# Patient Record
Sex: Female | Born: 1965 | Race: Black or African American | Hispanic: No | Smoking: Never smoker
Health system: Southern US, Community
[De-identification: ages and names within clinical notes are randomized; demographics above are authoritative.]

## PROBLEM LIST (undated history)

## (undated) DIAGNOSIS — C801 Malignant (primary) neoplasm, unspecified: Secondary | ICD-10-CM

## (undated) DIAGNOSIS — J449 Chronic obstructive pulmonary disease, unspecified: Secondary | ICD-10-CM

## (undated) DIAGNOSIS — J45909 Unspecified asthma, uncomplicated: Secondary | ICD-10-CM

## (undated) DIAGNOSIS — G4733 Obstructive sleep apnea (adult) (pediatric): Secondary | ICD-10-CM

## (undated) DIAGNOSIS — J3489 Other specified disorders of nose and nasal sinuses: Secondary | ICD-10-CM

## (undated) DIAGNOSIS — K219 Gastro-esophageal reflux disease without esophagitis: Secondary | ICD-10-CM

## (undated) DIAGNOSIS — N840 Polyp of corpus uteri: Secondary | ICD-10-CM

## (undated) DIAGNOSIS — H9190 Unspecified hearing loss, unspecified ear: Secondary | ICD-10-CM

## (undated) DIAGNOSIS — I10 Essential (primary) hypertension: Secondary | ICD-10-CM

## (undated) HISTORY — PX: TUBAL LIGATION: SHX77

## (undated) HISTORY — DX: Obstructive sleep apnea (adult) (pediatric): G47.33

## (undated) HISTORY — DX: Gastro-esophageal reflux disease without esophagitis: K21.9

## (undated) HISTORY — PX: CHOLECYSTECTOMY: SHX55

## (undated) HISTORY — DX: Unspecified asthma, uncomplicated: J45.909

---

## 1998-01-11 ENCOUNTER — Encounter: Admission: RE | Admit: 1998-01-11 | Discharge: 1998-01-11 | Payer: Self-pay | Admitting: Family Medicine

## 1998-06-13 ENCOUNTER — Encounter: Admission: RE | Admit: 1998-06-13 | Discharge: 1998-06-13 | Payer: Self-pay | Admitting: Family Medicine

## 1998-06-22 ENCOUNTER — Encounter (HOSPITAL_BASED_OUTPATIENT_CLINIC_OR_DEPARTMENT_OTHER): Payer: Self-pay | Admitting: General Surgery

## 1998-06-25 ENCOUNTER — Encounter (HOSPITAL_BASED_OUTPATIENT_CLINIC_OR_DEPARTMENT_OTHER): Payer: Self-pay | Admitting: General Surgery

## 1998-06-25 ENCOUNTER — Ambulatory Visit (HOSPITAL_COMMUNITY): Admission: RE | Admit: 1998-06-25 | Discharge: 1998-06-26 | Payer: Self-pay | Admitting: General Surgery

## 1998-11-29 ENCOUNTER — Encounter: Admission: RE | Admit: 1998-11-29 | Discharge: 1998-11-29 | Payer: Self-pay | Admitting: Family Medicine

## 1999-04-01 ENCOUNTER — Encounter: Admission: RE | Admit: 1999-04-01 | Discharge: 1999-04-01 | Payer: Self-pay | Admitting: Family Medicine

## 1999-04-09 ENCOUNTER — Encounter: Admission: RE | Admit: 1999-04-09 | Discharge: 1999-04-09 | Payer: Self-pay | Admitting: Family Medicine

## 1999-04-17 ENCOUNTER — Encounter: Admission: RE | Admit: 1999-04-17 | Discharge: 1999-04-17 | Payer: Self-pay | Admitting: Family Medicine

## 1999-05-09 ENCOUNTER — Encounter: Admission: RE | Admit: 1999-05-09 | Discharge: 1999-05-09 | Payer: Self-pay | Admitting: Family Medicine

## 1999-09-12 ENCOUNTER — Encounter: Admission: RE | Admit: 1999-09-12 | Discharge: 1999-09-12 | Payer: Self-pay | Admitting: Family Medicine

## 1999-11-06 ENCOUNTER — Encounter: Admission: RE | Admit: 1999-11-06 | Discharge: 1999-11-06 | Payer: Self-pay | Admitting: Family Medicine

## 1999-11-06 ENCOUNTER — Other Ambulatory Visit: Admission: RE | Admit: 1999-11-06 | Discharge: 1999-11-06 | Payer: Self-pay | Admitting: Family Medicine

## 2002-12-09 ENCOUNTER — Emergency Department (HOSPITAL_COMMUNITY): Admission: EM | Admit: 2002-12-09 | Discharge: 2002-12-09 | Payer: Self-pay | Admitting: Emergency Medicine

## 2002-12-13 ENCOUNTER — Emergency Department (HOSPITAL_COMMUNITY): Admission: EM | Admit: 2002-12-13 | Discharge: 2002-12-13 | Payer: Self-pay | Admitting: Emergency Medicine

## 2003-01-24 ENCOUNTER — Encounter: Admission: RE | Admit: 2003-01-24 | Discharge: 2003-01-24 | Payer: Self-pay | Admitting: Internal Medicine

## 2003-02-23 ENCOUNTER — Encounter: Admission: RE | Admit: 2003-02-23 | Discharge: 2003-02-23 | Payer: Self-pay | Admitting: Obstetrics and Gynecology

## 2003-06-28 ENCOUNTER — Encounter: Admission: RE | Admit: 2003-06-28 | Discharge: 2003-06-28 | Payer: Self-pay | Admitting: Internal Medicine

## 2003-06-28 ENCOUNTER — Ambulatory Visit (HOSPITAL_COMMUNITY): Admission: RE | Admit: 2003-06-28 | Discharge: 2003-06-28 | Payer: Self-pay | Admitting: Internal Medicine

## 2003-12-01 ENCOUNTER — Encounter: Admission: RE | Admit: 2003-12-01 | Discharge: 2003-12-01 | Payer: Self-pay | Admitting: Internal Medicine

## 2009-08-18 DIAGNOSIS — I10 Essential (primary) hypertension: Secondary | ICD-10-CM

## 2009-08-18 HISTORY — DX: Essential (primary) hypertension: I10

## 2010-05-05 ENCOUNTER — Emergency Department (HOSPITAL_COMMUNITY): Admission: EM | Admit: 2010-05-05 | Discharge: 2010-05-06 | Payer: Self-pay | Admitting: Emergency Medicine

## 2010-08-21 ENCOUNTER — Encounter
Admission: RE | Admit: 2010-08-21 | Discharge: 2010-08-21 | Payer: Self-pay | Source: Home / Self Care | Attending: Internal Medicine | Admitting: Internal Medicine

## 2010-10-17 ENCOUNTER — Emergency Department (HOSPITAL_COMMUNITY)
Admission: EM | Admit: 2010-10-17 | Discharge: 2010-10-17 | Disposition: A | Discharge status: Home / Self Care | Payer: Medicaid Other | Attending: Emergency Medicine | Admitting: Emergency Medicine

## 2010-10-17 DIAGNOSIS — K029 Dental caries, unspecified: Secondary | ICD-10-CM | POA: Insufficient documentation

## 2010-10-17 DIAGNOSIS — R112 Nausea with vomiting, unspecified: Secondary | ICD-10-CM | POA: Insufficient documentation

## 2010-10-17 DIAGNOSIS — Z7982 Long term (current) use of aspirin: Secondary | ICD-10-CM | POA: Insufficient documentation

## 2010-10-17 DIAGNOSIS — K209 Esophagitis, unspecified without bleeding: Secondary | ICD-10-CM | POA: Insufficient documentation

## 2010-10-17 DIAGNOSIS — K089 Disorder of teeth and supporting structures, unspecified: Secondary | ICD-10-CM | POA: Insufficient documentation

## 2010-10-17 DIAGNOSIS — Z79899 Other long term (current) drug therapy: Secondary | ICD-10-CM | POA: Insufficient documentation

## 2010-10-17 DIAGNOSIS — I10 Essential (primary) hypertension: Secondary | ICD-10-CM | POA: Insufficient documentation

## 2010-10-17 DIAGNOSIS — H9190 Unspecified hearing loss, unspecified ear: Secondary | ICD-10-CM | POA: Insufficient documentation

## 2010-10-17 DIAGNOSIS — R1013 Epigastric pain: Secondary | ICD-10-CM | POA: Insufficient documentation

## 2010-10-17 LAB — DIFFERENTIAL
Basophils Absolute: 0 10*3/uL (ref 0.0–0.1)
Basophils Relative: 0 % (ref 0–1)
Eosinophils Absolute: 0 10*3/uL (ref 0.0–0.7)
Eosinophils Relative: 0 % (ref 0–5)
Lymphocytes Relative: 15 % (ref 12–46)
Lymphs Abs: 1.4 10*3/uL (ref 0.7–4.0)
Monocytes Absolute: 0.7 10*3/uL (ref 0.1–1.0)
Monocytes Relative: 8 % (ref 3–12)
Neutro Abs: 7.2 10*3/uL (ref 1.7–7.7)
Neutrophils Relative %: 77 % (ref 43–77)

## 2010-10-17 LAB — LIPASE, BLOOD: Lipase: 27 U/L (ref 11–59)

## 2010-10-17 LAB — COMPREHENSIVE METABOLIC PANEL
ALT: 15 U/L (ref 0–35)
CO2: 29 mEq/L (ref 19–32)
Calcium: 9.2 mg/dL (ref 8.4–10.5)
Chloride: 102 mEq/L (ref 96–112)
Creatinine, Ser: 0.92 mg/dL (ref 0.4–1.2)
GFR calc non Af Amer: >60 mL/min (ref >60)
Glucose, Bld: 107 mg/dL — ABNORMAL HIGH (ref 70–99)
Sodium: 139 mEq/L (ref 135–145)
Total Bilirubin: 0.6 mg/dL (ref 0.3–1.2)

## 2010-10-17 LAB — CBC
HCT: 36 % (ref 36.0–46.0)
MCHC: 34.4 g/dL (ref 30.0–36.0)
MCV: 88 fL (ref 78.0–100.0)
RDW: 12.5 % (ref 11.5–15.5)

## 2010-10-17 LAB — URINALYSIS, ROUTINE W REFLEX MICROSCOPIC
Bilirubin Urine: NEGATIVE
Hgb urine dipstick: NEGATIVE
Protein, ur: NEGATIVE mg/dL
Urobilinogen, UA: 1 mg/dL (ref 0.0–1.0)

## 2010-10-17 LAB — POCT PREGNANCY, URINE: Preg Test, Ur: NEGATIVE

## 2010-10-31 LAB — COMPREHENSIVE METABOLIC PANEL
ALT: 25 U/L (ref 0–35)
AST: 29 U/L (ref 0–37)
Albumin: 3.8 g/dL (ref 3.5–5.2)
CO2: 23 mEq/L (ref 19–32)
Calcium: 8.7 mg/dL (ref 8.4–10.5)
Creatinine, Ser: 0.76 mg/dL (ref 0.4–1.2)
GFR calc Af Amer: >60 mL/min (ref >60)
Sodium: 137 mEq/L (ref 135–145)
Total Protein: 7.8 g/dL (ref 6.0–8.3)

## 2010-10-31 LAB — CBC
Hemoglobin: 12.6 g/dL (ref 12.0–15.0)
MCHC: 34.4 g/dL (ref 30.0–36.0)
Platelets: 339 10*3/uL (ref 150–400)
RDW: 12.7 % (ref 11.5–15.5)

## 2010-10-31 LAB — URINALYSIS, ROUTINE W REFLEX MICROSCOPIC
Bilirubin Urine: NEGATIVE
Glucose, UA: NEGATIVE mg/dL
Ketones, ur: NEGATIVE mg/dL
Nitrite: NEGATIVE
Specific Gravity, Urine: 1.016 (ref 1.005–1.030)
pH: 7.5 (ref 5.0–8.0)

## 2010-10-31 LAB — DIFFERENTIAL
Eosinophils Absolute: 0 10*3/uL (ref 0.0–0.7)
Eosinophils Relative: 0 % (ref 0–5)
Lymphocytes Relative: 9 % — ABNORMAL LOW (ref 12–46)
Lymphs Abs: 0.7 10*3/uL (ref 0.7–4.0)
Monocytes Absolute: 0.4 10*3/uL (ref 0.1–1.0)
Monocytes Relative: 5 % (ref 3–12)

## 2010-10-31 LAB — URINE MICROSCOPIC-ADD ON
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
## 2011-09-01 ENCOUNTER — Emergency Department: Payer: Self-pay | Admitting: Emergency Medicine

## 2011-09-01 LAB — URINALYSIS, COMPLETE
Bilirubin,UR: NEGATIVE
Blood: NEGATIVE
Hyaline Cast: 1
Nitrite: NEGATIVE
Ph: 5 (ref 4.5–8.0)
Squamous Epithelial: 11
WBC UR: 2 /HPF (ref 0–5)

## 2011-09-01 LAB — WET PREP, GENITAL

## 2011-09-01 LAB — CBC
HCT: 38.1 % (ref 35.0–47.0)
MCH: 29.6 pg (ref 26.0–34.0)
MCHC: 32.5 g/dL (ref 32.0–36.0)
Platelet: 349 10*3/uL (ref 150–440)
RBC: 4.19 10*6/uL (ref 3.80–5.20)
RDW: 12.7 % (ref 11.5–14.5)
WBC: 4.2 10*3/uL (ref 3.6–11.0)

## 2011-09-01 LAB — BASIC METABOLIC PANEL
BUN: 12 mg/dL (ref 7–18)
Chloride: 104 mmol/L (ref 98–107)
Creatinine: 0.86 mg/dL (ref 0.60–1.30)
EGFR (African American): >60
Glucose: 89 mg/dL (ref 65–99)
Potassium: 3.8 mmol/L (ref 3.5–5.1)
Sodium: 141 mmol/L (ref 136–145)

## 2011-09-01 LAB — PREGNANCY, URINE: Pregnancy Test, Urine: NEGATIVE m[IU]/mL

## 2011-09-02 LAB — URINE CULTURE

## 2012-03-05 ENCOUNTER — Encounter (HOSPITAL_COMMUNITY): Payer: Self-pay | Admitting: Emergency Medicine

## 2012-03-05 ENCOUNTER — Emergency Department (HOSPITAL_COMMUNITY): Payer: Medicaid Other

## 2012-03-05 ENCOUNTER — Observation Stay (HOSPITAL_COMMUNITY)
Admission: EM | Admit: 2012-03-05 | Discharge: 2012-03-07 | Disposition: A | Discharge status: Home / Self Care | Payer: Medicaid Other | Attending: Internal Medicine | Admitting: Internal Medicine

## 2012-03-05 DIAGNOSIS — H9190 Unspecified hearing loss, unspecified ear: Secondary | ICD-10-CM | POA: Insufficient documentation

## 2012-03-05 DIAGNOSIS — I10 Essential (primary) hypertension: Secondary | ICD-10-CM | POA: Diagnosis present

## 2012-03-05 DIAGNOSIS — R079 Chest pain, unspecified: Secondary | ICD-10-CM

## 2012-03-05 DIAGNOSIS — R0789 Other chest pain: Principal | ICD-10-CM | POA: Insufficient documentation

## 2012-03-05 DIAGNOSIS — K219 Gastro-esophageal reflux disease without esophagitis: Secondary | ICD-10-CM | POA: Diagnosis present

## 2012-03-05 HISTORY — DX: Other specified disorders of nose and nasal sinuses: J34.89

## 2012-03-05 HISTORY — DX: Essential (primary) hypertension: I10

## 2012-03-05 HISTORY — DX: Unspecified hearing loss, unspecified ear: H91.90

## 2012-03-05 LAB — CBC
Hemoglobin: 11.6 g/dL — ABNORMAL LOW (ref 12.0–15.0)
MCH: 29.7 pg (ref 26.0–34.0)
MCHC: 33.5 g/dL (ref 30.0–36.0)
Platelets: 379 10*3/uL (ref 150–400)
RBC: 3.9 MIL/uL (ref 3.87–5.11)

## 2012-03-05 LAB — BASIC METABOLIC PANEL
CO2: 27 mEq/L (ref 19–32)
Calcium: 9 mg/dL (ref 8.4–10.5)
GFR calc non Af Amer: 85 mL/min — ABNORMAL LOW (ref >90)
Glucose, Bld: 100 mg/dL — ABNORMAL HIGH (ref 70–99)
Potassium: 4.2 mEq/L (ref 3.5–5.1)
Sodium: 140 mEq/L (ref 135–145)

## 2012-03-05 LAB — TROPONIN I: Troponin I: <0.3 ng/mL (ref <0.30)

## 2012-03-05 MED ORDER — NITROGLYCERIN 0.4 MG SL SUBL
0.4000 mg | SUBLINGUAL_TABLET | SUBLINGUAL | Status: AC | PRN
  Administered 2012-03-05 (×3): 0.4 mg via SUBLINGUAL

## 2012-03-05 MED ORDER — MORPHINE SULFATE 2 MG/ML IJ SOLN
2.0000 mg | Freq: Once | INTRAMUSCULAR | Status: AC
  Administered 2012-03-05: 2 mg via INTRAVENOUS
  Filled 2012-03-05: qty 1

## 2012-03-05 MED ORDER — ACETAMINOPHEN 325 MG PO TABS
975.0000 mg | ORAL_TABLET | Freq: Once | ORAL | Status: AC
  Administered 2012-03-05: 975 mg via ORAL
  Filled 2012-03-05: qty 3
  Filled 2012-03-05: qty 1

## 2012-03-05 MED ORDER — GI COCKTAIL ~~LOC~~
30.0000 mL | Freq: Once | ORAL | Status: AC
  Administered 2012-03-05: 30 mL via ORAL
  Filled 2012-03-05: qty 30

## 2012-03-05 MED ORDER — MORPHINE SULFATE 4 MG/ML IJ SOLN
4.0000 mg | Freq: Once | INTRAMUSCULAR | Status: AC
  Administered 2012-03-05: 4 mg via INTRAVENOUS
  Filled 2012-03-05: qty 1

## 2012-03-05 MED ORDER — NITROGLYCERIN 2 % TD OINT
1.0000 [in_us] | TOPICAL_OINTMENT | Freq: Once | TRANSDERMAL | Status: AC
  Administered 2012-03-05: 1 [in_us] via TOPICAL
  Filled 2012-03-05: qty 1

## 2012-03-05 MED ORDER — FAMOTIDINE IN NACL 20-0.9 MG/50ML-% IV SOLN
20.0000 mg | Freq: Once | INTRAVENOUS | Status: AC
  Administered 2012-03-05: 20 mg via INTRAVENOUS
  Filled 2012-03-05: qty 50

## 2012-03-05 NOTE — ED Provider Notes (Signed)
Pt seen and examined by me in CDU. Pt with CP for 3 days, worse with movement and deep breaths. She is placed in CDU on CP protocol. Pt was cp free, negagive enzymes on major side, was considered to be low risk. When i went to examine pt, pt complained of "burning sensation" (translated through deaf interpreter) in the center of her chest, and she explaiend this is different, and that she has history of acid reflux disease. I ordered pt GI coctail, pepcid. Will reassess.   Pt uncomfortable appearing. VS:  Filed Vitals:   03/05/12 2046  BP: 127/79  Pulse: 79  Temp:   Resp:    Lungs are clear to auscultation, regular HR and rhythm. Mild tenderness with palpation over her chest, and upper abdomen. No guarding. WIll continue monitoring.   Pt;s pain continues now 10/10, even with morphine 4mg  IV. Spoke with Dr. Caprice Kluver, pt to be admitted by Dr.Mahoney-T. ECG repeated, no changes    Date: 03/05/2012  Rate: 81  Rhythm: normal sinus rhythm  QRS Axis: normal  Intervals: normal  ST/T Wave abnormalities: normal  Conduction Disutrbances:none  Narrative Interpretation:   Old EKG Reviewed: unchanged  Vital signs all within normal.     Lottie Mussel, PA 03/05/12 2326

## 2012-03-05 NOTE — ED Provider Notes (Signed)
Medical screening examination/treatment/procedure(s) were performed by non-physician practitioner and as supervising physician I was immediately available for consultation/collaboration.   August Longest, MD 03/05/12 2350 

## 2012-03-05 NOTE — ED Notes (Signed)
Called for a Financial planner, Spoke with [employer]

## 2012-03-05 NOTE — ED Notes (Signed)
Interpreter will arrive around 18:30-18:35

## 2012-03-05 NOTE — ED Notes (Signed)
Per EMS: pt c/o intermittent CP x 3 days; worse with movement; pt deaf; pain is worse today; pt given 324mg  ASA and 1 SL nitro without relief; IV 20g L FA; pt with hx of same that was anxiety

## 2012-03-05 NOTE — ED Notes (Signed)
Pain is changing and comes and goes, "went away completely" after 3rd ntg, then came back, rates 5/10 currently, describes as a little, described via interpreter.

## 2012-03-05 NOTE — ED Notes (Signed)
(  With interpreter): pt alert, NAD, calm, interactive, skin W&D, resps e/u, mentions pain, pinpoints to L breast, describes as squeezing and pinprick, rates 5/10, "better than before".

## 2012-03-05 NOTE — ED Provider Notes (Signed)
History     CSN: [id number]  Arrival date & time 03/05/12  1719   First MD Initiated Contact with Patient 03/05/12 1746      Chief Complaint  Patient presents with  . Chest Pain    (Consider location/radiation/quality/duration/timing/severity/associated sxs/prior treatment) Patient is a 46 y.o. female presenting with chest pain. The history is provided by the patient. A language interpreter was used (sign language).  Chest Pain The chest pain began 3 - 5 days ago. Chest pain occurs intermittently. The chest pain is unchanged. The pain is associated with exertion. At its most intense, the pain is at 7/10. The pain is currently at 1/10. The quality of the pain is described as aching and heavy. The pain does not radiate. Chest pain is worsened by exertion. Pertinent negatives for primary symptoms include no fever, no fatigue, no shortness of breath, no cough, no wheezing, no palpitations, no abdominal pain, no nausea, no vomiting and no dizziness.  Pertinent negatives for associated symptoms include no diaphoresis, no lower extremity edema, no near-syncope and no weakness. She tried nothing for the symptoms. Risk factors include obesity.  Her past medical history is significant for hypertension.  Pertinent negatives for past medical history include no CAD.  Her family medical history is significant for CAD in family.  Pertinent negatives for family medical history include: no early MI in family.     Past Medical History  Diagnosis Date  . Deaf   . Hypertension     History reviewed. No pertinent past surgical history.  History reviewed. No pertinent family history.  History  Substance Use Topics  . Smoking status: Not on file  . Smokeless tobacco: Not on file  . Alcohol Use:     OB History    Grav Para Term Preterm Abortions TAB SAB Ect Mult Living                  Review of Systems  Constitutional: Negative for fever, chills, diaphoresis and fatigue.  Respiratory:  Negative for cough, chest tightness, shortness of breath and wheezing.   Cardiovascular: Positive for chest pain. Negative for palpitations and near-syncope.  Gastrointestinal: Negative for nausea, vomiting and abdominal pain.  Musculoskeletal: Negative for back pain.  Skin: Negative for color change and rash.  Neurological: Negative for dizziness, weakness, light-headedness and headaches.  All other systems reviewed and are negative.    Allergies  Review of patient's allergies indicates no known allergies.  Home Medications   Current Outpatient Rx  Name Route Sig Dispense Refill  . AMLODIPINE BESYLATE 10 MG PO TABS Oral Take 10 mg by mouth daily.    Marland Kitchen LISINOPRIL-HYDROCHLOROTHIAZIDE 20-25 MG PO TABS Oral Take 1 tablet by mouth daily.    Marland Kitchen METOPROLOL SUCCINATE ER 50 MG PO TB24 Oral Take 50 mg by mouth daily. Take with or immediately following a meal.      BP 111/66  Pulse 75  Temp 98.4 F (36.9 C) (Oral)  Resp 18  SpO2 98%  Physical Exam  Nursing note and vitals reviewed. Constitutional: She is oriented to person, place, and time. She appears well-developed and well-nourished.  HENT:  Head: Normocephalic and atraumatic.  Eyes: Pupils are equal, round, and reactive to light.  Neck: No JVD present.  Cardiovascular: Normal rate, regular rhythm, normal heart sounds and intact distal pulses.   Pulmonary/Chest: Effort normal and breath sounds normal. No respiratory distress. She exhibits no tenderness.  Abdominal: Soft. She exhibits no distension. There is no tenderness.  Musculoskeletal: She exhibits no edema.  Neurological: She is alert and oriented to person, place, and time.  Skin: Skin is warm and dry.  Psychiatric: She has a normal mood and affect.    ED Course  Procedures (including critical care time)  Labs Reviewed  CBC - Abnormal; Notable for the following:    Hemoglobin 11.6 (*)     HCT 34.6 (*)     All other components within normal limits  BASIC METABOLIC  PANEL - Abnormal; Notable for the following:    Glucose, Bld 100 (*)     GFR calc non Af Amer 85 (*)     All other components within normal limits  TROPONIN I  TROPONIN I  TROPONIN I  LIPASE, BLOOD  HEPATIC FUNCTION PANEL   Dg Chest 2 View  03/05/2012  *RADIOLOGY REPORT*  Clinical Data: Chest pain.  CHEST - 2 VIEW  Comparison: None.  Findings: Heart size and pulmonary vascularity are normal and the lungs are clear.  The patient has an anatomic variant of an azygos fissure in the right lung apex.  No effusions.  No osseous abnormality.  IMPRESSION: No acute disease in the chest.  Original Report Authenticated By: Gwynn Burly, M.D.     Date: 03/05/2012  Rate: 81  Rhythm: normal sinus rhythm  QRS Axis: normal  Intervals: normal  ST/T Wave abnormalities: normal  Conduction Disutrbances:none  Narrative Interpretation:   Old EKG Reviewed: none available    1. Chest pain       MDM  This is a 46 year old female who presents with intermittent chest pain for 3 days. The patient states that it first started when she was cleaning the house, and has been waxing and waning since then, and has occasionally completely resolved. She says it feels worse when she is up and around cleaning the house, and caring for children, and improves when she has a chance to sit down and rest for a while. She has not taken any medication to treat this. She says when the pain gets bad it can last for a couple of hours until she wakes up the following morning, and stays better until she starts working again. She denies any shortness of breath, nausea or vomiting, or any other associated symptoms. Her exam is unremarkable, and her chest pain is nonreproducible. She has minimal pain at this point in time. Do to her somewhat exertional component and the fact that the patient is low risk we'll admit her to CDU for chest pain protocol.   Patient with continued intermittent chest pain despite treatment with multiple  medications. A lipase and hepatic panel was added on by Jaynie Crumble, PA to evaluate further. The patient will be admitted due to her continued chest pain.     Theotis Burrow, MD 03/06/12 [phone]

## 2012-03-05 NOTE — ED Provider Notes (Signed)
History is obtained through a traditional interpreter using sign language. Complains of intermittent chest pain for 3 days pain is worse with cooking or cleaning improved with remaining still symptoms lasted approximately one hour at a time she is presently asymptomatic. Patient with heart score of 3 based on story and risk factors. Plan CDU protocol for chest pain  Doug Sou, MD 03/05/12 4098

## 2012-03-06 ENCOUNTER — Observation Stay (HOSPITAL_COMMUNITY): Payer: Medicaid Other

## 2012-03-06 ENCOUNTER — Encounter (HOSPITAL_COMMUNITY): Payer: Self-pay | Admitting: Internal Medicine

## 2012-03-06 DIAGNOSIS — I10 Essential (primary) hypertension: Secondary | ICD-10-CM | POA: Diagnosis present

## 2012-03-06 DIAGNOSIS — R079 Chest pain, unspecified: Secondary | ICD-10-CM

## 2012-03-06 DIAGNOSIS — K219 Gastro-esophageal reflux disease without esophagitis: Secondary | ICD-10-CM | POA: Diagnosis present

## 2012-03-06 LAB — CBC
Hemoglobin: 10.8 g/dL — ABNORMAL LOW (ref 12.0–15.0)
MCH: 29.7 pg (ref 26.0–34.0)
MCHC: 33.2 g/dL (ref 30.0–36.0)
MCHC: 33.6 g/dL (ref 30.0–36.0)
MCV: 88.6 fL (ref 78.0–100.0)
Platelets: 363 10*3/uL (ref 150–400)
Platelets: 364 10*3/uL (ref 150–400)
RDW: 12.5 % (ref 11.5–15.5)
RDW: 12.6 % (ref 11.5–15.5)
WBC: 9.7 10*3/uL (ref 4.0–10.5)

## 2012-03-06 LAB — CREATININE, SERUM
GFR calc Af Amer: >90 mL/min (ref >90)
GFR calc non Af Amer: >90 mL/min (ref >90)

## 2012-03-06 LAB — LIPASE, BLOOD: Lipase: 27 U/L (ref 11–59)

## 2012-03-06 LAB — BASIC METABOLIC PANEL
BUN: 12 mg/dL (ref 6–23)
Calcium: 8.3 mg/dL — ABNORMAL LOW (ref 8.4–10.5)
GFR calc Af Amer: >90 mL/min (ref >90)
GFR calc non Af Amer: >90 mL/min (ref >90)
Potassium: 3.9 mEq/L (ref 3.5–5.1)

## 2012-03-06 LAB — TROPONIN I: Troponin I: <0.3 ng/mL (ref <0.30)

## 2012-03-06 LAB — HEPATIC FUNCTION PANEL
ALT: 29 U/L (ref 0–35)
AST: 43 U/L — ABNORMAL HIGH (ref 0–37)
Albumin: 3.5 g/dL (ref 3.5–5.2)

## 2012-03-06 LAB — CARDIAC PANEL(CRET KIN+CKTOT+MB+TROPI)
CK, MB: 1.6 ng/mL (ref 0.3–4.0)
CK, MB: 1.7 ng/mL (ref 0.3–4.0)
CK, MB: 1.6 ng/mL (ref 0.3–4.0)
Relative Index: INVALID (ref 0.0–2.5)
Total CK: 71 U/L (ref 7–177)
Total CK: 67 U/L (ref 7–177)
Troponin I: <0.3 ng/mL (ref <0.30)
Troponin I: <0.3 ng/mL (ref <0.30)
Troponin I: <0.3 ng/mL (ref <0.30)
Troponin I: <0.3 ng/mL (ref <0.30)

## 2012-03-06 LAB — LIPID PANEL
Cholesterol: 147 mg/dL (ref 0–200)
Triglycerides: 35 mg/dL (ref <150)
VLDL: 7 mg/dL (ref 0–40)

## 2012-03-06 MED ORDER — PANTOPRAZOLE SODIUM 40 MG PO TBEC
40.0000 mg | DELAYED_RELEASE_TABLET | Freq: Two times a day (BID) | ORAL | Status: DC
Start: 2012-03-06 — End: 2012-03-07
  Administered 2012-03-06 – 2012-03-07 (×2): 40 mg via ORAL
  Filled 2012-03-06: qty 1

## 2012-03-06 MED ORDER — NITROGLYCERIN 0.2 MG/HR TD PT24
0.2000 mg | MEDICATED_PATCH | Freq: Every day | TRANSDERMAL | Status: DC
  Administered 2012-03-06: 0.2 mg via TRANSDERMAL
  Filled 2012-03-06 (×2): qty 1

## 2012-03-06 MED ORDER — SODIUM CHLORIDE 0.9 % IJ SOLN
3.0000 mL | Freq: Two times a day (BID) | INTRAMUSCULAR | Status: DC
  Administered 2012-03-06 (×2): 3 mL via INTRAVENOUS

## 2012-03-06 MED ORDER — SODIUM CHLORIDE 0.45 % IV SOLN
INTRAVENOUS | Status: DC
  Administered 2012-03-06 (×2): via INTRAVENOUS

## 2012-03-06 MED ORDER — HYDROCHLOROTHIAZIDE 25 MG PO TABS
25.0000 mg | ORAL_TABLET | Freq: Every day | ORAL | Status: DC
  Administered 2012-03-06 – 2012-03-07 (×2): 25 mg via ORAL
  Filled 2012-03-06 (×2): qty 1

## 2012-03-06 MED ORDER — AMLODIPINE BESYLATE 10 MG PO TABS
10.0000 mg | ORAL_TABLET | Freq: Every day | ORAL | Status: DC
  Administered 2012-03-06 – 2012-03-07 (×2): 10 mg via ORAL
  Filled 2012-03-06 (×2): qty 1

## 2012-03-06 MED ORDER — LISINOPRIL-HYDROCHLOROTHIAZIDE 20-25 MG PO TABS: 1.0000 | ORAL_TABLET | Freq: Every day | ORAL | Status: DC

## 2012-03-06 MED ORDER — MORPHINE SULFATE 2 MG/ML IJ SOLN: 2.0000 mg | INTRAMUSCULAR | Status: DC | PRN

## 2012-03-06 MED ORDER — ENOXAPARIN SODIUM 40 MG/0.4ML ~~LOC~~ SOLN
40.0000 mg | SUBCUTANEOUS | Status: DC
  Administered 2012-03-06: 40 mg via SUBCUTANEOUS
  Filled 2012-03-06 (×2): qty 0.4

## 2012-03-06 MED ORDER — ACETAMINOPHEN 325 MG PO TABS: 650.0000 mg | ORAL_TABLET | Freq: Four times a day (QID) | ORAL | Status: DC | PRN

## 2012-03-06 MED ORDER — HYDROCODONE-ACETAMINOPHEN 5-325 MG PO TABS
1.0000 | ORAL_TABLET | ORAL | Status: DC | PRN
  Administered 2012-03-06 – 2012-03-07 (×3): 2 via ORAL
  Filled 2012-03-06: qty 2
  Filled 2012-03-06: qty 1
  Filled 2012-03-06 (×2): qty 2

## 2012-03-06 MED ORDER — ONDANSETRON HCL 4 MG/2ML IJ SOLN
4.0000 mg | Freq: Four times a day (QID) | INTRAMUSCULAR | Status: DC | PRN
  Administered 2012-03-06: 4 mg via INTRAVENOUS
  Filled 2012-03-06 (×3): qty 2

## 2012-03-06 MED ORDER — METOPROLOL SUCCINATE ER 50 MG PO TB24
50.0000 mg | ORAL_TABLET | Freq: Every day | ORAL | Status: DC
  Administered 2012-03-06 – 2012-03-07 (×2): 50 mg via ORAL
  Filled 2012-03-06 (×2): qty 1

## 2012-03-06 MED ORDER — ONDANSETRON HCL 4 MG PO TABS
4.0000 mg | ORAL_TABLET | Freq: Four times a day (QID) | ORAL | Status: DC | PRN
  Filled 2012-03-06: qty 1

## 2012-03-06 MED ORDER — ONDANSETRON HCL 4 MG/2ML IJ SOLN
4.0000 mg | Freq: Once | INTRAMUSCULAR | Status: AC
  Administered 2012-03-06: 4 mg via INTRAVENOUS
  Filled 2012-03-06: qty 2

## 2012-03-06 MED ORDER — ACETAMINOPHEN 650 MG RE SUPP: 650.0000 mg | Freq: Four times a day (QID) | RECTAL | Status: DC | PRN

## 2012-03-06 MED ORDER — MORPHINE SULFATE 4 MG/ML IJ SOLN
4.0000 mg | Freq: Once | INTRAMUSCULAR | Status: AC
  Administered 2012-03-06: 4 mg via INTRAVENOUS
  Filled 2012-03-06: qty 1

## 2012-03-06 MED ORDER — ASPIRIN EC 81 MG PO TBEC
81.0000 mg | DELAYED_RELEASE_TABLET | Freq: Every day | ORAL | Status: DC
  Administered 2012-03-06 – 2012-03-07 (×2): 81 mg via ORAL
  Filled 2012-03-06 (×2): qty 1

## 2012-03-06 MED ORDER — LISINOPRIL 20 MG PO TABS
20.0000 mg | ORAL_TABLET | Freq: Every day | ORAL | Status: DC
  Administered 2012-03-06 – 2012-03-07 (×2): 20 mg via ORAL
  Filled 2012-03-06 (×2): qty 1

## 2012-03-06 NOTE — ED Notes (Signed)
Report called to Leighton Roach, RN on Dept 2000; patient and interpreter provided with an update re: room assignment/plan of care.

## 2012-03-06 NOTE — ED Provider Notes (Signed)
I have personally seen and examined the patient.  I have discussed the plan of care with the resident.  I have reviewed the documentation on PMH/FH/Soc. History.  I have reviewed the documentation of the resident and agree.  Doug Sou, MD 03/06/12 1914

## 2012-03-06 NOTE — H&P (Signed)
Caitlin [name] is an 46 y.o. female.   Chief Complaint: Chest pain HPI: A 46 year old female with history of hypertension presenting with central chest pain x2 days. Pain has been persistent rated as 4-8/10 centrally located. Denied diaphoresis no radiation. Pain was initially with movement but currently even at rest. No prior history of cardiac disease. Denied tobacco use. Patient denied having any high cholesterol. She is taking her blood pressure medications and her blood pressure has been good. Denied family history of early coronary artery disease. She has a problem with hearing and legally deaf. Pain responded to nitroglycerin in the ER. It is not reproducible by pressure on the chest wall. She has moderate risk factors for coronary artery disease hands she is being admitted for rule out MI.  Past Medical History  Diagnosis Date  . Deaf   . Hypertension     History reviewed. No pertinent past surgical history.  History reviewed. No pertinent family history. Social History:  does not have a smoking history on file. She does not have any smokeless tobacco history on file. Her alcohol and drug histories not on file.  Allergies: No Known Allergies  Medications Prior to Admission  Medication Sig Dispense Refill  . amLODipine (NORVASC) 10 MG tablet Take 10 mg by mouth daily.      Marland Kitchen lisinopril-hydrochlorothiazide (PRINZIDE,ZESTORETIC) 20-25 MG per tablet Take 1 tablet by mouth daily.      . metoprolol succinate (TOPROL-XL) 50 MG 24 hr tablet Take 50 mg by mouth daily. Take with or immediately following a meal.        Results for orders placed during the hospital encounter of 03/05/12 (from the past 48 hour(s))  CBC     Status: Abnormal   Collection Time   03/05/12  6:45 PM      Component Value Range Comment   WBC 5.9  4.0 - 10.5 K/uL    RBC 3.90  3.87 - 5.11 MIL/uL    Hemoglobin 11.6 (*) 12.0 - 15.0 g/dL    HCT 40.9 (*) 81.1 - 46.0 %    MCV 88.7  78.0 - 100.0 fL    MCH 29.7  26.0  - 34.0 pg    MCHC 33.5  30.0 - 36.0 g/dL    RDW 91.4  78.2 - 95.6 %    Platelets 379  150 - 400 K/uL   BASIC METABOLIC PANEL     Status: Abnormal   Collection Time   03/05/12  6:45 PM      Component Value Range Comment   Sodium 140  135 - 145 mEq/L    Potassium 4.2  3.5 - 5.1 mEq/L    Chloride 103  96 - 112 mEq/L    CO2 27  19 - 32 mEq/L    Glucose, Bld 100 (*) 70 - 99 mg/dL    BUN 14  6 - 23 mg/dL    Creatinine, Ser 2.13  0.50 - 1.10 mg/dL    Calcium 9.0  8.4 - 08.6 mg/dL    GFR calc non Af Amer 85 (*) >90 mL/min    GFR calc Af Amer >90  >90 mL/min   TROPONIN I     Status: Normal   Collection Time   03/05/12  6:45 PM      Component Value Range Comment   Troponin I <0.30  <0.30 ng/mL   TROPONIN I     Status: Normal   Collection Time   03/05/12  9:32 PM  Component Value Range Comment   Troponin I <0.30  <0.30 ng/mL   TROPONIN I     Status: Normal   Collection Time   03/06/12 12:20 AM      Component Value Range Comment   Troponin I <0.30  <0.30 ng/mL   LIPASE, BLOOD     Status: Normal   Collection Time   03/06/12 12:20 AM      Component Value Range Comment   Lipase 27  11 - 59 U/L   HEPATIC FUNCTION PANEL     Status: Abnormal   Collection Time   03/06/12 12:20 AM      Component Value Range Comment   Total Protein 7.0  6.0 - 8.3 g/dL    Albumin 3.5  3.5 - 5.2 g/dL    AST 43 (*) 0 - 37 U/L    ALT 29  0 - 35 U/L    Alkaline Phosphatase 52  39 - 117 U/L    Total Bilirubin 0.3  0.3 - 1.2 mg/dL    Bilirubin, Direct 0.1  0.0 - 0.3 mg/dL    Indirect Bilirubin 0.2 (*) 0.3 - 0.9 mg/dL   CBC     Status: Abnormal   Collection Time   03/06/12  2:00 AM      Component Value Range Comment   WBC 9.7  4.0 - 10.5 K/uL    RBC 3.67 (*) 3.87 - 5.11 MIL/uL    Hemoglobin 10.8 (*) 12.0 - 15.0 g/dL    HCT 91.4 (*) 78.2 - 46.0 %    MCV 88.6  78.0 - 100.0 fL    MCH 29.4  26.0 - 34.0 pg    MCHC 33.2  30.0 - 36.0 g/dL    RDW 95.6  21.3 - 08.6 %    Platelets 363  150 - 400 K/uL     CREATININE, SERUM     Status: Normal   Collection Time   03/06/12  2:00 AM      Component Value Range Comment   Creatinine, Ser 0.75  0.50 - 1.10 mg/dL    GFR calc non Af Amer >90  >90 mL/min    GFR calc Af Amer >90  >90 mL/min   CARDIAC PANEL(CRET KIN+CKTOT+MB+TROPI)     Status: Normal   Collection Time   03/06/12  2:03 AM      Component Value Range Comment   Total CK 61  7 - 177 U/L    CK, MB 1.5  0.3 - 4.0 ng/mL    Troponin I <0.30  <0.30 ng/mL    Relative Index RELATIVE INDEX IS INVALID  0.0 - 2.5   BASIC METABOLIC PANEL     Status: Abnormal   Collection Time   03/06/12  2:04 AM      Component Value Range Comment   Sodium 134 (*) 135 - 145 mEq/L    Potassium 3.9  3.5 - 5.1 mEq/L    Chloride 101  96 - 112 mEq/L    CO2 22  19 - 32 mEq/L    Glucose, Bld 178 (*) 70 - 99 mg/dL    BUN 12  6 - 23 mg/dL    Creatinine, Ser 5.78  0.50 - 1.10 mg/dL    Calcium 8.3 (*) 8.4 - 10.5 mg/dL    GFR calc non Af Amer >90  >90 mL/min    GFR calc Af Amer >90  >90 mL/min   CBC     Status: Abnormal   Collection Time  03/06/12  2:04 AM      Component Value Range Comment   WBC 9.9  4.0 - 10.5 K/uL    RBC 3.64 (*) 3.87 - 5.11 MIL/uL    Hemoglobin 10.8 (*) 12.0 - 15.0 g/dL    HCT 16.1 (*) 09.6 - 46.0 %    MCV 88.2  78.0 - 100.0 fL    MCH 29.7  26.0 - 34.0 pg    MCHC 33.6  30.0 - 36.0 g/dL    RDW 04.5  40.9 - 81.1 %    Platelets 364  150 - 400 K/uL    Dg Chest 2 View  03/05/2012  *RADIOLOGY REPORT*  Clinical Data: Chest pain.  CHEST - 2 VIEW  Comparison: None.  Findings: Heart size and pulmonary vascularity are normal and the lungs are clear.  The patient has an anatomic variant of an azygos fissure in the right lung apex.  No effusions.  No osseous abnormality.  IMPRESSION: No acute disease in the chest.  Original Report Authenticated By: Gwynn Burly, M.D.    Review of Systems  Constitutional: Negative.   HENT: Negative.   Eyes: Negative.   Respiratory: Positive for shortness of breath.  Negative for cough, hemoptysis, sputum production and wheezing.   Cardiovascular: Positive for chest pain. Negative for palpitations, orthopnea, claudication, leg swelling and PND.  Gastrointestinal: Negative.   Genitourinary: Negative.   Musculoskeletal: Negative.   Neurological: Negative.   Endo/Heme/Allergies: Negative.   Psychiatric/Behavioral: Negative.     Blood pressure 101/66, pulse 62, temperature 97.6 F (36.4 C), temperature source Oral, resp. rate 18, height 5\' 2"  (1.575 m), weight 81.3 kg (179 lb 3.7 oz), last menstrual period 02/19/2012, SpO2 96.00%. Physical Exam  Constitutional: She is oriented to person, place, and time. She appears well-developed and well-nourished.  HENT:  Head: Normocephalic and atraumatic.  Right Ear: External ear normal.  Left Ear: External ear normal.  Nose: Nose normal.  Mouth/Throat: Oropharynx is clear and moist.  Eyes: Conjunctivae and EOM are normal. Pupils are equal, round, and reactive to light.  Neck: Normal range of motion. Neck supple.  Cardiovascular: Normal rate, regular rhythm, normal heart sounds and intact distal pulses.   Respiratory: Effort normal and breath sounds normal.  GI: Soft. Bowel sounds are normal.  Musculoskeletal: Normal range of motion.  Neurological: She is alert and oriented to person, place, and time. She has normal reflexes.  Skin: Skin is warm and dry.  Psychiatric: She has a normal mood and affect. Her behavior is normal. Judgment and thought content normal.     Assessment/Plan A 46 year old female was low to moderate status for coronary artery disease presenting with chest pain. Patient is being admitted for MI rule out.  Plan #1 chest pain: Admit patient for observation. Check serial cardiac enzymes x3. Consider possible outpatient stress test if enzymes negative. Patient's EKG is unchanged at this point. Given some aspirin nitroglycerin as well as morphine.  Plan #2 hypertension: Her blood pressure is  well controlled on her home regimen. Continue his home medication at this point. GARBA,LAWAL 03/06/2012, 5:41 AM

## 2012-03-06 NOTE — Progress Notes (Signed)
Patient ID: Caitlin [name]  female  ZOX:[id number]    DOB: [birth year]    DOA: 03/05/2012  PCP: Sheila Oats, MD  Subjective: Patient seen and examined her earlier, states chest pain improving, also complaining of some heartburn.  Objective: Weight change:   Intake/Output Summary (Last 24 hours) at 03/06/12 1142 Last data filed at 03/06/12 0719  Gross per 24 hour  Intake      0 ml  Output    550 ml  Net   -550 ml   Blood pressure 101/66, pulse 62, temperature 97.6 F (36.4 C), temperature source Oral, resp. rate 18, height 5\' 2"  (1.575 m), weight 81.3 kg (179 lb 3.7 oz), last menstrual period 02/19/2012, SpO2 96.00%.  Physical Exam: General: Alert and awake, oriented x3, not in any acute distress, deaf HEENT: anicteric sclera, pupils reactive to light and accommodation, EOMI CVS: S1-S2 clear, no murmur rubs or gallops Chest: clear to auscultation bilaterally, no wheezing, rales or rhonchi Abdomen: soft nontender, nondistended, normal bowel sounds, no organomegaly Extremities: no cyanosis, clubbing or edema noted bilaterally Neuro: Cranial nerves II-XII intact, no focal neurological deficits  Lab Results: Basic Metabolic Panel:  Lab 03/06/12 8119 03/06/12 0200 03/05/12 1845  NA 134* -- 140  K 3.9 -- 4.2  CL 101 -- 103  CO2 22 -- 27  GLUCOSE 178* -- 100*  BUN 12 -- 14  CREATININE 0.75 0.75 --  CALCIUM 8.3* -- 9.0  MG -- -- --  PHOS -- -- --   Liver Function Tests:  Lab 03/06/12 0020  AST 43*  ALT 29  ALKPHOS 52  BILITOT 0.3  PROT 7.0  ALBUMIN 3.5    Lab 03/06/12 0020  LIPASE 27  AMYLASE --   No results found for this basename: AMMONIA:2 in the last 168 hours CBC:  Lab 03/06/12 0204 03/06/12 0200  WBC 9.9 9.7  NEUTROABS -- --  HGB 10.8* 10.8*  HCT 32.1* 32.5*  MCV 88.2 88.6  PLT 364 363   Cardiac Enzymes:  Lab 03/06/12 0203 03/06/12 0020 03/05/12 2132  CKTOTAL 61 -- --  CKMB 1.5 -- --  CKMBINDEX -- -- --  TROPONINI <0.30 <0.30 <0.30    BNP: No components found with this basename: POCBNP:2 CBG: No results found for this basename: GLUCAP:5 in the last 168 hours   Micro Results: No results found for this or any previous visit (from the past 240 hour(s)).  Studies/Results: Dg Chest 2 View  03/05/2012  *RADIOLOGY REPORT*  Clinical Data: Chest pain.  CHEST - 2 VIEW  Comparison: None.  Findings: Heart size and pulmonary vascularity are normal and the lungs are clear.  The patient has an anatomic variant of an azygos fissure in the right lung apex.  No effusions.  No osseous abnormality.  IMPRESSION: No acute disease in the chest.  Original Report Authenticated By: Gwynn Burly, M.D.    Medications: Scheduled Meds:   . acetaminophen  975 mg Oral Once  . amLODipine  10 mg Oral Daily  . aspirin EC  81 mg Oral Daily  . enoxaparin (LOVENOX) injection  40 mg Subcutaneous Q24H  . famotidine (PEPCID) IV  20 mg Intravenous Once  . gi cocktail  30 mL Oral Once  . hydrochlorothiazide  25 mg Oral Daily  . lisinopril  20 mg Oral Daily  . metoprolol succinate  50 mg Oral Daily  .  morphine injection  2 mg Intravenous Once  . morphine  4 mg Intravenous Once  . morphine  4 mg Intravenous Once  . nitroGLYCERIN  0.2 mg Transdermal Daily  . nitroGLYCERIN  1 inch Topical Once  . ondansetron (ZOFRAN) IV  4 mg Intravenous Once  . sodium chloride  3 mL Intravenous Q12H  . DISCONTD: lisinopril-hydrochlorothiazide  1 tablet Oral Daily   Continuous Infusions:   . sodium chloride 100 mL/hr at 03/06/12 0218     Assessment/Plan: Principal Problem:  *Chest pain Active Problems:  HTN (hypertension) GERD  - Patient admitted by Dr. Mikeal Hawthorne this morning for observation, one set of cardiac enzymes negative. Follow serial cardiac enzymes to rule out ACS. - Ordered 2-D echocardiogram for further workup. - Placed on PPI as patient is also complaining of GERD and esophagitis. - BP well controlled  DVT Prophylaxis: Lovenox  Code  Status: Full code  Disposition: Hopefully tomorrow morning, 2-D echo pending, rule out ACS   LOS: 1 day   Obelia Bonello M.D. [facility] 03/06/2012, 11:42 AM Pager: [phone]  If 7PM-7AM, please contact night-coverage [email or web address] Password [email or web address]

## 2012-03-07 DIAGNOSIS — K219 Gastro-esophageal reflux disease without esophagitis: Secondary | ICD-10-CM

## 2012-03-07 DIAGNOSIS — R072 Precordial pain: Secondary | ICD-10-CM

## 2012-03-07 MED ORDER — SIMVASTATIN 10 MG PO TABS: 10.0000 mg | ORAL_TABLET | Freq: Every day | ORAL | Status: DC

## 2012-03-07 MED ORDER — ASPIRIN 81 MG PO TBEC: 81.0000 mg | DELAYED_RELEASE_TABLET | Freq: Every day | ORAL | Status: AC

## 2012-03-07 MED ORDER — NITROGLYCERIN 0.4 MG SL SUBL: 0.4000 mg | SUBLINGUAL_TABLET | SUBLINGUAL | Status: DC | PRN

## 2012-03-07 MED ORDER — SIMVASTATIN 10 MG PO TABS
10.0000 mg | ORAL_TABLET | Freq: Every day | ORAL | Status: DC
  Filled 2012-03-07: qty 1

## 2012-03-07 MED ORDER — PANTOPRAZOLE SODIUM 40 MG PO TBEC: 40.0000 mg | DELAYED_RELEASE_TABLET | Freq: Every day | ORAL | Status: DC

## 2012-03-07 NOTE — Progress Notes (Signed)
  Echocardiogram 2D Echocardiogram has been performed.  Georgian Co 03/07/2012, 8:59 AM

## 2012-03-07 NOTE — Discharge Summary (Signed)
Physician Discharge Summary  Patient ID: Caitlin [name] MRN: [id number] DOB/AGE: 46-Nov-1967 46 y.o.  Admit date: 03/05/2012 Discharge date: 03/07/2012  Primary Care Physician:  Dorrene German, MD  Discharge Diagnoses:    . atypical Chest pain likely secondary to esophagitis/GERD  .HTN (hypertension) .GERD (gastroesophageal reflux disease)  Consults:  None  Discharge Medications: Medication List  As of 03/07/2012  7:43 AM   TAKE these medications         amLODipine 10 MG tablet   Commonly known as: NORVASC   Take 10 mg by mouth daily.      aspirin 81 MG EC tablet   Take 1 tablet (81 mg total) by mouth daily.      lisinopril-hydrochlorothiazide 20-25 MG per tablet   Commonly known as: PRINZIDE,ZESTORETIC   Take 1 tablet by mouth daily.      metoprolol succinate 50 MG 24 hr tablet   Commonly known as: TOPROL-XL   Take 50 mg by mouth daily. Take with or immediately following a meal.      nitroGLYCERIN 0.4 MG SL tablet   Commonly known as: NITROSTAT   Place 1 tablet (0.4 mg total) under the tongue every 5 (five) minutes as needed for chest pain.      pantoprazole 40 MG tablet   Commonly known as: PROTONIX   Take 1 tablet (40 mg total) by mouth daily.      simvastatin 10 MG tablet   Commonly known as: ZOCOR   Take 1 tablet (10 mg total) by mouth at bedtime.             Brief H and P: For complete details please refer to admission H and P, but in brief the patient is a 46 year old female with history of hypertension presented with chest pain x2 days. Pain was persistent rated as 4-8/10 centrally located. She denied diaphoresis or any radiation. Pain was initially with movement but then later at rest. No prior history of cardiac disease. Denied tobacco use. She Denied family history of early coronary artery disease. Pain responded to nitroglycerin in the ER. It is not reproducible by pressure on the chest wall.   Hospital Course:  Patient was admitted for  observation on telemetry floor, she was ruled out for acute ACS, serial cardiac enzymes remained negative. Given moderate risk factors for possible cardiac origin of chest pain, she underwent 2-D echocardiogram. 2-D echogram showed EF of 60-65%, normal wall motion, no regional wall motion abnormalities. Patient was also started on protonix as she did complain of GERD. Patient was discharged home in stable condition.    Day of Discharge BP 99/58  Pulse 64  Temp 98.4 F (36.9 C) (Oral)  Resp 20  Ht 5\' 2"  (1.575 m)  Wt 81.3 kg (179 lb 3.7 oz)  BMI 32.78 kg/m2  SpO2 96%  LMP 02/19/2012  Physical Exam: General: Alert and awake oriented x3 not in any acute distress. HEENT: anicteric sclera, pupils reactive to light and accommodation CVS: S1-S2 clear no murmur rubs or gallops Chest: clear to auscultation bilaterally, no wheezing rales or rhonchi Abdomen: soft nontender, nondistended, normal bowel sounds, no organomegaly Extremities: no cyanosis, clubbing or edema noted bilaterally Neuro: Cranial nerves II-XII intact, no focal neurological deficits   The results of significant diagnostics from this hospitalization (including imaging, microbiology, ancillary and laboratory) are listed below for reference.    LAB RESULTS: Basic Metabolic Panel:  Lab 03/06/12 4098 03/06/12 0200 03/05/12 1845  NA 134* -- 140  K 3.9 --  4.2  CL 101 -- 103  CO2 22 -- 27  GLUCOSE 178* -- 100*  BUN 12 -- 14  CREATININE 0.75 0.75 --  CALCIUM 8.3* -- 9.0  MG -- -- --  PHOS -- -- --   Liver Function Tests:  Lab 03/06/12 0020  AST 43*  ALT 29  ALKPHOS 52  BILITOT 0.3  PROT 7.0  ALBUMIN 3.5    Lab 03/06/12 0020  LIPASE 27  AMYLASE --   CBC:  Lab 03/06/12 0204 03/06/12 0200  WBC 9.9 9.7  NEUTROABS -- --  HGB 10.8* 10.8*  HCT 32.1* 32.5*  MCV 88.2 --  PLT 364 363   Cardiac Enzymes:  Lab 03/07/12 0305 03/06/12 2119  CKTOTAL 66 67  CKMB 1.5 1.6  CKMBINDEX -- --  TROPONINI <0.30 <0.30      Significant Diagnostic Studies:  Dg Chest 2 View  03/05/2012  *RADIOLOGY REPORT*  Clinical Data: Chest pain.  CHEST - 2 VIEW  Comparison: None.  Findings: Heart size and pulmonary vascularity are normal and the lungs are clear.  The patient has an anatomic variant of an azygos fissure in the right lung apex.  No effusions.  No osseous abnormality.  IMPRESSION: No acute disease in the chest.  Original Report Authenticated By: Gwynn Burly, M.D.     Disposition and Follow-up: Discharge Orders    Future Orders Please Complete By Expires   Diet - low sodium heart healthy      Increase activity slowly          DISPOSITION: Home DIET: Heart healthy ACTIVITY: As tolerated   DISCHARGE FOLLOW-UP Follow-up Information    Follow up with Fleet Contras A, MD. Schedule an appointment as soon as possible for a visit in 10 days. (for hospital follow-up)    Contact information:   [address] [place] [address] [phone]          Time spent on Discharge: 35 minutes  Signed:   Carlia Bomkamp M.D. [facility] 03/07/2012, 7:43 AM Pager: [phone]  If 7PM-7AM, please contact night-coverage [email or web address] Password [email or web address]

## 2012-05-28 ENCOUNTER — Emergency Department: Payer: Self-pay | Admitting: Internal Medicine

## 2012-05-28 LAB — COMPREHENSIVE METABOLIC PANEL
Albumin: 3.9 g/dL (ref 3.4–5.0)
Alkaline Phosphatase: 74 U/L (ref 50–136)
Anion Gap: 11 (ref 7–16)
BUN: 15 mg/dL (ref 7–18)
Bilirubin,Total: 0.3 mg/dL (ref 0.2–1.0)
Creatinine: 1.08 mg/dL (ref 0.60–1.30)
EGFR (African American): >60
EGFR (Non-African Amer.): >60
Glucose: 98 mg/dL (ref 65–99)
Osmolality: 280 (ref 275–301)
SGPT (ALT): 22 U/L (ref 12–78)
Sodium: 140 mmol/L (ref 136–145)

## 2012-05-28 LAB — URINALYSIS, COMPLETE
Bilirubin,UR: NEGATIVE
Glucose,UR: NEGATIVE mg/dL (ref 0–75)
Ketone: NEGATIVE
Ph: 5 (ref 4.5–8.0)
Protein: NEGATIVE
RBC,UR: 67 /HPF (ref 0–5)
Squamous Epithelial: 3

## 2012-05-28 LAB — CBC
HGB: 12.1 g/dL (ref 12.0–16.0)
MCHC: 33.5 g/dL (ref 32.0–36.0)
Platelet: 406 10*3/uL (ref 150–440)
RDW: 13.1 % (ref 11.5–14.5)

## 2012-05-28 LAB — CK TOTAL AND CKMB (NOT AT ARMC)
CK, Total: 68 U/L (ref 21–215)
CK-MB: <0.5 ng/mL — ABNORMAL LOW (ref 0.5–3.6)

## 2012-05-28 LAB — LIPASE, BLOOD: Lipase: 168 U/L (ref 73–393)

## 2013-08-19 ENCOUNTER — Emergency Department (HOSPITAL_COMMUNITY): Payer: Medicaid Other

## 2013-08-19 ENCOUNTER — Emergency Department (HOSPITAL_COMMUNITY)
Admission: EM | Admit: 2013-08-19 | Discharge: 2013-08-19 | Disposition: A | Discharge status: Home / Self Care | Payer: Medicaid Other | Attending: Emergency Medicine | Admitting: Emergency Medicine

## 2013-08-19 ENCOUNTER — Encounter (HOSPITAL_COMMUNITY): Payer: Self-pay | Admitting: Emergency Medicine

## 2013-08-19 DIAGNOSIS — J069 Acute upper respiratory infection, unspecified: Secondary | ICD-10-CM | POA: Insufficient documentation

## 2013-08-19 DIAGNOSIS — I10 Essential (primary) hypertension: Secondary | ICD-10-CM | POA: Insufficient documentation

## 2013-08-19 DIAGNOSIS — H9190 Unspecified hearing loss, unspecified ear: Secondary | ICD-10-CM | POA: Insufficient documentation

## 2013-08-19 DIAGNOSIS — R002 Palpitations: Secondary | ICD-10-CM | POA: Insufficient documentation

## 2013-08-19 DIAGNOSIS — R209 Unspecified disturbances of skin sensation: Secondary | ICD-10-CM | POA: Insufficient documentation

## 2013-08-19 LAB — URINALYSIS, ROUTINE W REFLEX MICROSCOPIC
BILIRUBIN URINE: NEGATIVE
Glucose, UA: NEGATIVE mg/dL
HGB URINE DIPSTICK: NEGATIVE
KETONES UR: NEGATIVE mg/dL
Leukocytes, UA: NEGATIVE
Nitrite: NEGATIVE
PROTEIN: NEGATIVE mg/dL
Specific Gravity, Urine: 1.025 (ref 1.005–1.030)
UROBILINOGEN UA: 0.2 mg/dL (ref 0.0–1.0)
pH: 7.5 (ref 5.0–8.0)

## 2013-08-19 LAB — CBC WITH DIFFERENTIAL/PLATELET
BASOS PCT: 0 % (ref 0–1)
Basophils Absolute: 0 10*3/uL (ref 0.0–0.1)
EOS ABS: 0.2 10*3/uL (ref 0.0–0.7)
EOS PCT: 3 % (ref 0–5)
HCT: 37 % (ref 36.0–46.0)
Hemoglobin: 12.4 g/dL (ref 12.0–15.0)
LYMPHS ABS: 1.4 10*3/uL (ref 0.7–4.0)
Lymphocytes Relative: 20 % (ref 12–46)
MCH: 29.8 pg (ref 26.0–34.0)
MCHC: 33.5 g/dL (ref 30.0–36.0)
MCV: 88.9 fL (ref 78.0–100.0)
MONOS PCT: 10 % (ref 3–12)
Monocytes Absolute: 0.7 10*3/uL (ref 0.1–1.0)
Neutro Abs: 4.6 10*3/uL (ref 1.7–7.7)
Neutrophils Relative %: 67 % (ref 43–77)
PLATELETS: 349 10*3/uL (ref 150–400)
RBC: 4.16 MIL/uL (ref 3.87–5.11)
RDW: 12.5 % (ref 11.5–15.5)
WBC: 6.8 10*3/uL (ref 4.0–10.5)

## 2013-08-19 LAB — BASIC METABOLIC PANEL
BUN: 16 mg/dL (ref 6–23)
CALCIUM: 8.7 mg/dL (ref 8.4–10.5)
CO2: 28 mEq/L (ref 19–32)
Chloride: 104 mEq/L (ref 96–112)
Creatinine, Ser: 1.04 mg/dL (ref 0.50–1.10)
GFR calc Af Amer: 73 mL/min — ABNORMAL LOW (ref >90)
GFR, EST NON AFRICAN AMERICAN: 63 mL/min — AB (ref >90)
GLUCOSE: 100 mg/dL — AB (ref 70–99)
Potassium: 4.4 mEq/L (ref 3.7–5.3)
SODIUM: 141 meq/L (ref 137–147)

## 2013-08-19 MED ORDER — LISINOPRIL-HYDROCHLOROTHIAZIDE 20-25 MG PO TABS: 1.0000 | ORAL_TABLET | Freq: Every day | ORAL | Status: DC

## 2013-08-19 MED ORDER — BENZONATATE 100 MG PO CAPS: 100.0000 mg | ORAL_CAPSULE | Freq: Three times a day (TID) | ORAL | Status: DC

## 2013-08-19 MED ORDER — SODIUM CHLORIDE 0.9 % IV BOLUS (SEPSIS)
1000.0000 mL | Freq: Once | INTRAVENOUS | Status: AC
  Administered 2013-08-19: 1000 mL via INTRAVENOUS

## 2013-08-19 NOTE — ED Provider Notes (Signed)
CSN: [id number]     Arrival date & time 08/19/13  1145 History   First MD Initiated Contact with Patient 08/19/13 1255     Chief Complaint  Patient presents with  . Chest Pain   (Consider location/radiation/quality/duration/timing/severity/associated sxs/prior Treatment) HPI Comments: Patient with c/o non-productive cough x 2 weeks, URI symptoms x 1 week including runny nose, nasal congestion, sore throat. She also described 'racing heart' sensation several times over the past day associated with tingling in L arm but no SOB, syncope, lightheadedness. Patient denies risk factors for pulmonary embolism including: unilateral leg swelling, history of DVT/PE/other blood clots, use of estrogens, recent immobilizations, recent surgery, recent travel (>4hr segment), malignancy, hemoptysis. She admits to taking OTC cough medications for symptoms recently with some relief. Patient is concerned that her symptoms are due to high blood pressure. She has been off her medications for about a year and a half (Lisinopril-HCTZ, amlodipine, metoprolol). Patient denies signs of end-organ damage due to hypertenstion including: neurologic symptoms (facial droop, slurred speech, aphasia, weakness/numbness in extremities, imbalance), headache, vision loss, chest pain, extremity swelling, shortness of breath, decrease in urination.   The history is provided by the patient. The history is limited by a language barrier. A language interpreter was used (sign language interpreter).    Past Medical History  Diagnosis Date  . Deaf   . Hypertension   . NO (nasal obstruction)    History reviewed. No pertinent past surgical history. No family history on file. History  Substance Use Topics  . Smoking status: Not on file  . Smokeless tobacco: Not on file  . Alcohol Use:    OB History   Grav Para Term Preterm Abortions TAB SAB Ect Mult Living                 Review of Systems  All other systems reviewed and are  negative.    Allergies  Review of patient's allergies indicates no known allergies.  Home Medications  No current outpatient prescriptions on file. BP 188/103  Pulse 103  Temp(Src) 98.7 F (37.1 C) (Oral)  Resp 20  Wt 172 lb 3 oz (78.104 kg)  SpO2 98%  LMP 07/11/2013 Physical Exam  Nursing note and vitals reviewed. Constitutional: She is oriented to person, place, and time. She appears well-developed and well-nourished.  HENT:  Head: Normocephalic and atraumatic.  Right Ear: Tympanic membrane, external ear and ear canal normal.  Left Ear: Tympanic membrane, external ear and ear canal normal.  Nose: Mucosal edema and rhinorrhea present.  Mouth/Throat: Uvula is midline, oropharynx is clear and moist and mucous membranes are normal. Mucous membranes are not dry. No oral lesions. No trismus in the jaw. No uvula swelling. No oropharyngeal exudate, posterior oropharyngeal edema, posterior oropharyngeal erythema or tonsillar abscesses.  Eyes: Conjunctivae, EOM and lids are normal. Pupils are equal, round, and reactive to light. Right eye exhibits no discharge. Left eye exhibits no discharge. Right eye exhibits no nystagmus. Left eye exhibits no nystagmus.  Neck: Normal range of motion. Neck supple.  Cardiovascular: Normal rate, regular rhythm, normal heart sounds and intact distal pulses.   No murmur heard. Pulmonary/Chest: Effort normal and breath sounds normal. No respiratory distress. She has no wheezes. She has no rales.  Abdominal: Soft. There is no tenderness.  Musculoskeletal: She exhibits no edema and no tenderness.       Cervical back: She exhibits normal range of motion, no tenderness and no bony tenderness.  Lymphadenopathy:    She has no  cervical adenopathy.  Neurological: She is alert and oriented to person, place, and time. She has normal strength and normal reflexes. No cranial nerve deficit or sensory deficit. She displays a negative Romberg sign. Coordination and gait  normal. GCS eye subscore is 4. GCS verbal subscore is 5. GCS motor subscore is 6.  Skin: Skin is warm and dry.  Psychiatric: She has a normal mood and affect.    ED Course  Procedures (including critical care time) Labs Review Labs Reviewed  BASIC METABOLIC PANEL - Abnormal; Notable for the following:    Glucose, Bld 100 (*)    GFR calc non Af Amer 63 (*)    GFR calc Af Amer 73 (*)    All other components within normal limits  URINALYSIS, ROUTINE W REFLEX MICROSCOPIC - Abnormal; Notable for the following:    APPearance CLOUDY (*)    All other components within normal limits  CBC WITH DIFFERENTIAL   Imaging Review Dg Chest 2 View  08/19/2013   CLINICAL DATA:  Cough.  EXAM: CHEST  2 VIEW  COMPARISON:  March 05, 2012.  FINDINGS: The heart size and mediastinal contours are within normal limits. Both lungs are clear. No pneumothorax or pleural effusion is noted. The visualized skeletal structures are unremarkable.  IMPRESSION: No active cardiopulmonary disease.   Electronically Signed   By: Roque Lias M.D.   On: 08/19/2013 15:35    EKG Interpretation    Date/Time:  Friday August 19 2013 11:51:01 EST Ventricular Rate:  101 PR Interval:  148 QRS Duration: 92 QT Interval:  354 QTC Calculation: 459 R Axis:   70 Text Interpretation:  Sinus tachycardia Otherwise normal ECG No significant change since last tracing Confirmed by GOLDSTON  MD, SCOTT ([id number]) on 08/19/2013 1:39:52 PM           Patient seen and examined. Work-up initiated. Medications ordered. Patient discussed with Dr. Criss Alvine.   Vital signs reviewed and are as follows: Filed Vitals:   08/19/13 1156  BP: 188/103  Pulse: 103  Temp: 98.7 F (37.1 C)  Resp: 20   Pt informed of all results via interpreter.   Patient counseled on supportive care for viral URI and s/s to return including worsening symptoms, persistent fever, persistent vomiting, or if they have any other concerns.  Urged to see PCP if symptoms persist  for more than 3 days. Patient verbalizes understanding and agrees with plan.   Urged patient to avoid OTC cold medications, limit caffeine as these can cause palpitations.  Patient urged to return with worsening symptoms or other concerns. Patient verbalized understanding and agrees with plan. Urged to see PCP -- referrals given.     MDM   1. Palpitations   2. Upper respiratory tract infection   3. Hypertension    Palpitations: no concerning lightheadness/syncope, SOB, CP. Feel this is likely benign given normal EKG without signs of Brugada, WPW, increased QTc. Possibly side effect of recent OTC medication use. She can f/u with PCP.   URI: continue supportive care, tessalon for cough.   HTN: No end-organ damage. Will restart lisinopril-HCTZ (nml creatinine). Given her history of using 4 antihypertensives to control BP, pt will likely not reach goal so she is urged to f/u with PCP for titration.    Renne Crigler, PA-C 08/20/13 [id number]

## 2013-08-19 NOTE — ED Notes (Addendum)
Pt reports onset of chest pain and left arm pain since yesterday. Pt deaf, called for sign language interpreter. Pt writing at this time. Pt writes she is also taking robitussin for cold.

## 2013-08-19 NOTE — ED Notes (Signed)
Sign language interp at bedside.

## 2013-08-19 NOTE — ED Notes (Signed)
Pt communicated through written communication that she has had a cough for the past 2 weeks. Pain with cough. Communicates that she has been taking otc cough meds and last night she felt her heart race. Non-productive cough

## 2013-08-19 NOTE — Discharge Instructions (Signed)
Please read and follow all provided instructions.  Your diagnoses today include:  1. Palpitations   2. Upper respiratory tract infection   3. Hypertension     You appear to have an upper respiratory infection (URI). An upper respiratory tract infection, or cold, is a viral infection of the air passages leading to the lungs. It should improve gradually after 5-7 days. You may have a lingering cough that lasts for 2- 4 weeks after the infection.  Tests performed today include:  Vital signs. See below for your results today.   Blood counts and electrolytes - normal  Urine test - no infection  Chest x-ray - no pneumonia  Medications prescribed:   Tessalon Perles - cough suppressant medication  Take any prescribed medications only as directed. Treatment for your infection is aimed at treating the symptoms. There are no medications, such as antibiotics, that will cure your infection.   Home care instructions:  Follow any educational materials contained in this packet.   Your illness is contagious and can be spread to others, especially during the first 3 or 4 days. It cannot be cured by antibiotics or other medicines. Take basic precautions such as washing your hands often, covering your mouth when you cough or sneeze, and avoiding public places where you could spread your illness to others.   Please continue drinking plenty of fluids.  Use over-the-counter medicines as needed as directed on packaging for symptom relief.  You may also use ibuprofen or tylenol as directed on packaging for pain or fever.  Do not take multiple medicines containing Tylenol or acetaminophen to avoid taking too much of this medication.  Follow-up instructions: Please follow-up with your primary care provider in the next 3 days for further evaluation of your symptoms if you are not feeling better. If you do not have a primary care doctor -- see below for referral information.   Return instructions:   Please  return to the Emergency Department if you experience worsening symptoms.   RETURN IMMEDIATELY IF you develop shortness of breath, confusion or altered mental status, a new rash, become dizzy, faint, or poorly responsive, or are unable to be cared for at home.  Please return if you have persistent vomiting and cannot keep down fluids or develop a fever that is not controlled by tylenol or motrin.    Please return if you have any other emergent concerns.  Additional Information:  Your vital signs today were: BP 188/103   Pulse 103   Temp(Src) 98.7 F (37.1 C) (Oral)   Resp 20   Wt 172 lb 3 oz (78.104 kg)   SpO2 98%   LMP 07/11/2013 If your blood pressure (BP) was elevated above 135/85 this visit, please have this repeated by your doctor within one month. --------------  Emergency Department Resource Guide 1) Find a Doctor and Pay Out of Pocket Although you won't have to find out who is covered by your insurance plan, it is a good idea to ask around and get recommendations. You will then need to call the office and see if the doctor you have chosen will accept you as a new patient and what types of options they offer for patients who are self-pay. Some doctors offer discounts or will set up payment plans for their patients who do not have insurance, but you will need to ask so you aren't surprised when you get to your appointment.  2) Contact Your Local Health Department Not all health departments have doctors that  can see patients for sick visits, but many do, so it is worth a call to see if yours does. If you don't know where your local health department is, you can check in your phone book. The CDC also has a tool to help you locate your state's health department, and many state websites also have listings of all of their local health departments.  3) Find a Walk-in Clinic If your illness is not likely to be very severe or complicated, you may want to try a walk in clinic. These are popping  up all over the country in pharmacies, drugstores, and shopping centers. They're usually staffed by nurse practitioners or physician assistants that have been trained to treat common illnesses and complaints. They're usually fairly quick and inexpensive. However, if you have serious medical issues or chronic medical problems, these are probably not your best option.  No Primary Care Doctor: - Call Health Connect at  [phone] - they can help you locate a primary care doctor that  accepts your insurance, provides certain services, etc. - Physician Referral Service- [phone]  Chronic Pain Problems: Organization         Address  Phone   Notes  [facility]  [phone] Patients need to be referred by their primary care doctor.   Medication Assistance: Organization         Address  Phone   Notes  [facility] Medication [facility] [address] [place], [place] [address] [phone] --Must be a resident of [place] [facility] -- Must have NO insurance coverage whatsoever (no Medicaid/ Medicare, etc.) -- The pt. MUST have a primary care doctor that directs their care regularly and follows them in the community   Med[facility]ist  [phone]   [employer]  [phone]    Agencies that provide inexpensive medical care: Organization         Address  Phone   Notes  [facility]  [phone]   [facility]    [phone]) [phone]   [facility] [address] [place], [place] [address] [phone]   [facility] [address], [place] [phone]   Planned Parenthood    [phone]   [facility]    [phone]   [facility]  [address], [place] Phone:  [phone], Fax:  [phone]) [phone] Hours of Operation:  9 am - 6 pm, M-F.  Also accepts Medicaid/Medicare and self-pay.  [facility]  [address], [place] Phone: [phone], Fax: [phone]. Hours of Operation:  8:30 am - 5:30 pm, M-F.  Also accepts Medicaid and self-pay.  [facility] [place] [address], [place] Phone: [phone]) [phone]   [facility] [address], [place], [place] [phone], Ext. [phone] Mondays & Thursdays: 7-9 AM.  First 15 patients are seen on a first come, first serve basis.    Medicaid-accepting [facility] Providers:  Organization         Address  Phone   Notes  [facility] [address], [place] [phone] Also accepts self-pay patients.  [facility] [address], [place]  [phone]) [phone]   [facility] [address], [place] [phone]   [facility] [address] [facility]  [address], [place] [phone]   Renaye Rakers [address], [place]   [phone] Only accepts [place] patients after they have their name applied to their card.   Self-Pay (no insurance) in [facility] [place]:  Organization         Address  Phone   Notes  Sickle Cell Patients, [facility] [address] [place], [place] [phone]   [facility] [address] [place], [place] [phone]   [facility]  [address],  [phone]   [facility]/Dr. Osei-Bonsu  [address] [place] or [address], [place] [phone] Phone number for both [place] and [place] locations is the same.  [facility] [address], [place] [phone]   [facility] [address] [place] or [address] [phone]   [facility] [address], [place] [phone], phone; [phone], fax Sees patients 1st and 3rd Saturday of  every month.  Must not qualify for public or private insurance (i.e. Medicaid, Medicare, [place] Health Choice, Veterans' Benefits)  Household income should be no more than 200% of the poverty level The clinic cannot treat you if you are pregnant or think you are pregnant  Sexually transmitted diseases are not treated at the clinic.    Dental Care: Organization         Address  Phone  Notes  [facility] [address] [place], [place] [phone] Accepts children up to age 66 who are enrolled in [place] or [place] Health Choice; pregnant women with a Medicaid card; and children who have applied for Medicaid or [place] Health Choice, but were declined, whose parents can pay a reduced fee at time of service.  [facility]  [address], [place] [phone] Accepts children up to age 7 who are enrolled in [place] or [place] Health Choice; pregnant women with a Medicaid card; and children who have applied for Medicaid or [place] Health Choice, but were declined, whose parents can pay a reduced fee at time of service.  [facility]  [address] [place], [place] [phone] Patients are seen by appointment only. Walk-ins are not accepted. [facility] will see patients 60 years of age and older. Monday - Tuesday (8am-5pm) Most Wednesdays (8:30-5pm) $30 per visit, cash only  [facility] Adult Dental Access [facility]  [address], [facility] [phone] Patients are seen by appointment only. Walk-ins are not accepted. [facility] will see patients 28 years of age and older. One Wednesday Evening (Monthly: Volunteer Based).  $30 per visit, cash only  [employer]  [phone] for adults; Children under age 61, call Graduate Pediatric Dentistry at [phone]. Children aged 94-14, please call [phone] to request a pediatric application.  Dental  services are provided in all areas of dental care including fillings, crowns and bridges, complete and partial dentures, implants, gum treatment, root canals, and extractions. Preventive care is also provided. Treatment is provided to both adults and children. Patients are selected via a lottery and there is often a waiting list.   [facility] [address], [place]  [phone] [email or web address]   [facility] [address], [place], [place] [phone], Ext. [phone] Second and Fourth Thursday of each month, opens at 6:30 AM; Clinic ends at 9 AM.  Patients are seen on a first-come first-served basis, and a limited number are seen during each clinic.   [facility]  [address], [place], [place] [phone]   Eligibility Requirements You must have lived in [place], [place], or [place] counties for at least the last three months.   You cannot be eligible for state or federal sponsored National Cityhealthcare insurance, including CIGNAVeterans Administration, [place]icaid, or [employer]Medicare.   You generally cannot be eligible for healthcare insurance through your employer.    How to apply: Eligibility screenings are held every Tuesday and Wednesday afternoon from 1:00 pm until 4:00 pm. You do not need an appointment for the interview!  [facility] [address], [place], [place] [phone]   [facility]  [phone]   [facility]  [phone]   [facility]  [phone]    Behavioral Health Resources in the Community: Intensive Outpatient Programs Organization         Address  Phone  Notes  [facility] [address], [place], [place] [phone]   [facility] [address], [place], [place] [phone]   [facility] [address], [place], [place]  [phone]   [facility] [address],    [place], [place] [phone] or [phone]   Substance Abuse Resources Organization         Address  Phone  Notes  [facility]  [phone]   [facility]  [phone]   [facility]  [phone]   [facility]  [phone]   Residential & Outpatient Substance Abuse Program  [phone]   Psychological Services Organization         Address  Phone  Notes  [facility]  [phone]- [phone]   [facility]  [phone]- [phone]   [facility] [address], [place] [phone] or [phone]    Mobile Crisis Teams Organization         Address  Phone  Notes  [facility], Mobile Crisis Care Unit  [phone]   [facility]  [address] [place], [place] [phone]   Doristine LocksSharon DeEsch [address] [place] [phone]    Self-Help/Support Groups Organization         Address  Phone             Notes  [facility] [place] - variety of support groups  336- [phone] Call for more information  Narcotics Anonymous (NA), [facility] [address], [place]  2 meetings at this location   Statisticianesidential Treatment Programs Organization         Address  Phone  Notes  [facility] [address],    [place]  [phone]   [facility]  [address], [place] [phone], [place], [place] [phone]   [facility] [address], [place] [phone] Admissions: 8am-3pm M-F  [facility] [address]    [place], [place] [phone]   [facility] [address], [place], [place] [phone]   [facility] [address]  [place], [place] [phone]   [facility] - Intensive Outpatient [address], [place], [place] [phone]  [facility] ([facility] [address]  [place], [place]  [phone] or [phone]   [facility] (RT[facility]) [address] [place], [place] [phone] Accepts Medicaid  [facility] [address]  [place] [phone] Substance Abuse/Addiction Treatment   [facility] Health Resources Organization         Address  Phone  Notes  [facility]  [phone]) [phone]   Angie FavaJulie Brannon, PhD [address], [address] [place], [place]   [phone] or [phone]) [phone]   [facility]   [address] [place], [place] [phone]   [facility] [address], [place], [place] [phone](336) [phone] Insurance/Medicaid/sponsorship through [facility]  Faith and Families [address]                                    [place], [place] [phone]) [phone] Therapy/tele-psych/case  [facility] [address].   [place], [place] [phone]    Dr. Lolly MustacheArfeen  [phone]   [facility] [place]  [employer] [facility] 1) [address], [place] 2) [address], [place] 3)  [address], [place] [phone](336) [phone]  [phone](336) [phone]   [facility] Child Abuse Hotline [phone](336) 9131051739 or [phone] (After Hours)

## 2013-08-22 NOTE — ED Provider Notes (Signed)
Medical screening examination/treatment/procedure(s) were performed by non-physician practitioner and as supervising physician I was immediately available for consultation/collaboration.  EKG Interpretation    Date/Time:  Friday August 19 2013 11:51:01 EST Ventricular Rate:  101 PR Interval:  148 QRS Duration: 92 QT Interval:  354 QTC Calculation: 459 R Axis:   70 Text Interpretation:  Sinus tachycardia Otherwise normal ECG No significant change since last tracing Confirmed by Maury Bamba  MD, Koa Palla ([id number]) on 08/19/2013 1:39:52 PM              Audree CamelScott T Jacob Chamblee, MD 08/22/13 [phone]

## 2013-08-25 ENCOUNTER — Telehealth: Payer: Self-pay | Admitting: Internal Medicine

## 2013-08-25 NOTE — Telephone Encounter (Signed)
Pt is having difficulties with a rash; pt has used OTC cream but has had no relief' please call pt to advise

## 2013-08-29 NOTE — Telephone Encounter (Signed)
Left message on voicemail for pt to call.

## 2013-09-30 ENCOUNTER — Ambulatory Visit: Payer: Medicaid Other | Admitting: Internal Medicine

## 2013-11-28 ENCOUNTER — Ambulatory Visit: Payer: Medicaid Other | Attending: Internal Medicine | Admitting: Internal Medicine

## 2013-11-28 VITALS — BP 160/98 | HR 79 | Temp 98.7°F | Resp 16

## 2013-11-28 DIAGNOSIS — R21 Rash and other nonspecific skin eruption: Secondary | ICD-10-CM | POA: Insufficient documentation

## 2013-11-28 DIAGNOSIS — K219 Gastro-esophageal reflux disease without esophagitis: Secondary | ICD-10-CM

## 2013-11-28 DIAGNOSIS — I10 Essential (primary) hypertension: Secondary | ICD-10-CM | POA: Insufficient documentation

## 2013-11-28 MED ORDER — DOXYCYCLINE HYCLATE 100 MG PO TABS: 100.0000 mg | ORAL_TABLET | Freq: Two times a day (BID) | ORAL | Status: DC

## 2013-11-28 MED ORDER — PANTOPRAZOLE SODIUM 40 MG PO TBEC: 40.0000 mg | DELAYED_RELEASE_TABLET | Freq: Every day | ORAL | Status: DC

## 2013-11-28 MED ORDER — LISINOPRIL-HYDROCHLOROTHIAZIDE 20-25 MG PO TABS: 1.0000 | ORAL_TABLET | Freq: Every day | ORAL | Status: DC

## 2013-11-28 NOTE — Progress Notes (Signed)
Patient ID: Caitlin [name], female   DOB: 02/22/1966, 48 y.o.   MRN: [id number]  CC: Vaginal rash  HPI: 48 year old female with past medical history of hypertension who presented to clinic for evaluation of vaginal rash that has been gone on for about a year. Patient has tried multiple lotions and different powders but with no significant relief. The rash comes and goes and when it does come, it lasts for couple of weeks. No abnormal vaginal discharge.  No Known Allergies Past Medical History  Diagnosis Date  . Deaf   . Hypertension   . NO (nasal obstruction)    Current Outpatient Prescriptions on File Prior to Visit  Medication Sig Dispense Refill  . benzonatate (TESSALON) 100 MG capsule Take 1 capsule (100 mg total) by mouth every 8 (eight) hours.  15 capsule  0   No current facility-administered medications on file prior to visit.   History reviewed. No pertinent family history. History   Social History  . Marital Status: Married    Spouse Name: N/A    Number of Children: N/A  . Years of Education: N/A   Occupational History  . Not on file.   Social History Main Topics  . Smoking status: Never Smoker   . Smokeless tobacco: Not on file  . Alcohol Use: Yes  . Drug Use: Not on file  . Sexual Activity: Not on file   Other Topics Concern  . Not on file   Social History Narrative  . No narrative on file    Review of Systems  Constitutional: Negative for fever, chills, diaphoresis, activity change, appetite change and fatigue.  HENT: Negative for ear pain, nosebleeds, congestion, facial swelling, rhinorrhea, neck pain, neck stiffness and ear discharge.   Eyes: Negative for pain, discharge, redness, itching and visual disturbance.  Respiratory: Negative for cough, choking, chest tightness, shortness of breath, wheezing and stridor.   Cardiovascular: Negative for chest pain, palpitations and leg swelling.  Gastrointestinal: Negative for abdominal distention.   Genitourinary: Negative for dysuria, urgency, frequency, hematuria, flank pain, decreased urine volume, difficulty urinating and dyspareunia.  Musculoskeletal: Negative for back pain, joint swelling, arthralgias and gait problem.  Neurological: Negative for dizziness, tremors, seizures, syncope, facial asymmetry, speech difficulty, weakness, light-headedness, numbness and headaches.  Hematological: Negative for adenopathy. Does not bruise/bleed easily.  Psychiatric/Behavioral: Negative for hallucinations, behavioral problems, confusion, dysphoric mood, decreased concentration and agitation.    Objective:   Filed Vitals:   11/28/13 1040  BP: 160/98  Pulse: 79  Temp: 98.7 F (37.1 C)  Resp: 16    Physical Exam  Constitutional: Appears well-developed and well-nourished. No distress.  HENT: Normocephalic. External right and left ear normal. Oropharynx is clear and moist.  Eyes: Conjunctivae and EOM are normal. PERRLA, no scleral icterus.  Neck: Normal ROM. Neck supple. No JVD. No tracheal deviation. No thyromegaly.  CVS: RRR, S1/S2 +, no murmurs, no gallops, no carotid bruit.  Pulmonary: Effort and breath sounds normal, no stridor, rhonchi, wheezes, rales.  Abdominal: Soft. BS +,  no distension, tenderness, rebound or guarding.  Musculoskeletal: Normal range of motion. No edema and no tenderness.  Lymphadenopathy: No lymphadenopathy noted, cervical, inguinal. Neuro: Alert. Normal reflexes, muscle tone coordination. No cranial nerve deficit. Skin: Skin is warm and dry. No rash noted. Not diaphoretic. No erythema. No pallor.  Psychiatric: Normal mood and affect. Behavior, judgment, thought content normal.   Lab Results  Component Value Date   WBC 6.8 08/19/2013   HGB 12.4 08/19/2013  HCT 37.0 08/19/2013   MCV 88.9 08/19/2013   PLT 349 08/19/2013   Lab Results  Component Value Date   CREATININE 1.04 08/19/2013   BUN 16 08/19/2013   NA 141 08/19/2013   K 4.4 08/19/2013   CL 104 08/19/2013   CO2  28 08/19/2013    No results found for this basename: HGBA1C   Lipid Panel     Component Value Date/Time   CHOL 147 03/06/2012 0542   TRIG 35 03/06/2012 0542   HDL 37* 03/06/2012 0542   CHOLHDL 4.0 03/06/2012 0542   VLDL 7 03/06/2012 0542   LDLCALC 103* 03/06/2012 0542       Assessment and plan:   Patient Active Problem List   Diagnosis Date Noted  . Rash and nonspecific skin eruption, vaginal area 11/28/2013    Priority: Medium - Patient lotion tried multiple lotions with no relief - start doxycycline 100 mg twice daily for 10 days  - GYN referral   . HTN (hypertension) 03/06/2012    Priority: Medium - We have discussed target BP range - I have advised pt to check BP regularly and to call us back if the numbers are higher than 140/90 - discussed the importance of compliance with medical therapy and diet  - Continue Prinzide   . GERD (gastroesophageal reflux disease) 03/06/2012    Priority: Medium - Start Protonix 40 mg daily

## 2013-11-28 NOTE — Patient Instructions (Signed)

## 2013-11-28 NOTE — Progress Notes (Signed)
Patient here with interpreter Here to establish care Takes medication for hypertension

## 2013-12-08 ENCOUNTER — Encounter: Payer: Self-pay | Admitting: Nurse Practitioner

## 2013-12-16 ENCOUNTER — Ambulatory Visit (INDEPENDENT_AMBULATORY_CARE_PROVIDER_SITE_OTHER): Payer: Medicaid Other | Admitting: Nurse Practitioner

## 2013-12-16 ENCOUNTER — Encounter: Payer: Self-pay | Admitting: Nurse Practitioner

## 2013-12-16 VITALS — BP 142/81 | HR 80 | Ht 64.0 in | Wt 163.9 lb

## 2013-12-16 DIAGNOSIS — B373 Candidiasis of vulva and vagina: Secondary | ICD-10-CM

## 2013-12-16 DIAGNOSIS — B3731 Acute candidiasis of vulva and vagina: Secondary | ICD-10-CM

## 2013-12-16 LAB — HEMOGLOBIN A1C
Hgb A1c MFr Bld: 5.3 % (ref <5.7)
Mean Plasma Glucose: 105 mg/dL (ref <117)

## 2013-12-16 LAB — RPR

## 2013-12-16 MED ORDER — FLUCONAZOLE 150 MG PO TABS: 150.0000 mg | ORAL_TABLET | Freq: Every day | ORAL | Status: DC

## 2013-12-16 NOTE — Addendum Note (Signed)
Addended by: Delbert PhenixBAREFOOT, Kambry Takacs M on: 12/16/2013 10:58 AM   Modules accepted: Orders

## 2013-12-16 NOTE — Patient Instructions (Signed)
Monilial Vaginitis  Vaginitis in a soreness, swelling and redness (inflammation) of the vagina and vulva. Monilial vaginitis is not a sexually transmitted infection.  CAUSES   Yeast vaginitis is caused by yeast (candida) that is normally found in your vagina. With a yeast infection, the candida has overgrown in number to a point that upsets the chemical balance.  SYMPTOMS   · White, thick vaginal discharge.  · Swelling, itching, redness and irritation of the vagina and possibly the lips of the vagina (vulva).  · Burning or painful urination.  · Painful intercourse.  DIAGNOSIS   Things that may contribute to monilial vaginitis are:  · Postmenopausal and virginal states.  · Pregnancy.  · Infections.  · Being tired, sick or stressed, especially if you had monilial vaginitis in the past.  · Diabetes. Good control will help lower the chance.  · Birth control pills.  · Tight fitting garments.  · Using bubble bath, feminine sprays, douches or deodorant tampons.  · Taking certain medications that kill germs (antibiotics).  · Sporadic recurrence can occur if you become ill.  TREATMENT   Your caregiver will give you medication.  · There are several kinds of anti monilial vaginal creams and suppositories specific for monilial vaginitis. For recurrent yeast infections, use a suppository or cream in the vagina 2 times a week, or as directed.  · Anti-monilial or steroid cream for the itching or irritation of the vulva may also be used. Get your caregiver's permission.  · Painting the vagina with methylene blue solution may help if the monilial cream does not work.  · Eating yogurt may help prevent monilial vaginitis.  HOME CARE INSTRUCTIONS   · Finish all medication as prescribed.  · Do not have sex until treatment is completed or after your caregiver tells you it is okay.  · Take warm sitz baths.  · Do not douche.  · Do not use tampons, especially scented ones.  · Wear cotton underwear.  · Avoid tight pants and panty  hose.  · Tell your sexual partner that you have a yeast infection. They should go to their caregiver if they have symptoms such as mild rash or itching.  · Your sexual partner should be treated as well if your infection is difficult to eliminate.  · Practice safer sex. Use condoms.  · Some vaginal medications cause latex condoms to fail. Vaginal medications that harm condoms are:  · Cleocin cream.  · Butoconazole (Femstat®).  · Terconazole (Terazol®) vaginal suppository.  · Miconazole (Monistat®) (may be purchased over the counter).  SEEK MEDICAL CARE IF:   · You have a temperature by mouth above 102° F (38.9° C).  · The infection is getting worse after 2 days of treatment.  · The infection is not getting better after 3 days of treatment.  · You develop blisters in or around your vagina.  · You develop vaginal bleeding, and it is not your menstrual period.  · You have pain when you urinate.  · You develop intestinal problems.  · You have pain with sexual intercourse.  Document Released: 05/14/2005 Document Revised: 10/27/2011 Document Reviewed: 01/26/2009  ExitCare® Patient Information ©2014 [employer].

## 2013-12-16 NOTE — Progress Notes (Signed)
Patient reports that she has persistant vaginal itching, not much discharge. Her pcp put her on doxycycline for it but this hasn't really helped.

## 2013-12-16 NOTE — Progress Notes (Addendum)
History:  Caitlin SimmondsDana Yvette [name] a 48 y.o. [phone] who [facility] of vaginal rash that has been ongoing for one year. She states it is itching inside and out. She has a new partner in the last year but the rash was before that partner. She has 7 lifetime partners. She has regular menses. BTL for contraception. She has used multiple OTC meds for this problem and nothing has helped. She saw her PCP and was given Doxycycline which did not help. She has had elevated blood sugars for past 3-5 years and admits to eating a lot of sweets. 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  The following portions of the patient's history were reviewed and updated as appropriate: allergies, current medications, past family history, past medical history, past social history, past surgical history and problem list.  Review of Systems:  Pertinent items are noted in HPI.  Objective:  Physical Exam BP 142/81  Pulse 80  Ht 5\' 4"  (1.626 m)  Wt 163 lb 14.4 oz (74.345 kg)  BMI 28.12 kg/m2  LMP 12/15/2013 GENERAL: Well-developed, well-nourished female in no acute distress.  HEENT: Normocephalic, atraumatic.  NECK: Supple. Normal thyroid.  LUNGS: Normal rate. Clear to auscultation bilaterally.  HEART: Regular rate and rhythm with no adventitious sounds.  BREASTS: Symmetric in size. No masses, skin changes, nipple drainage, or lymphadenopathy. ABDOMEN: Soft, nontender, nondistended. No organomegaly. Normal bowel sounds appreciated in all quadrants.  PELVIC: External female genitalia is darkened over labia and mons. Vagina is pink and rugated.  Thick clumpy vaginal discharge. Normal cervix contour. Uterus is normal in size and firm.  No adnexal mass or tenderness.  EXTREMITIES: No cyanosis, clubbing, or edema, 2+ distal pulses.   Labs and Imaging No results found.  Assessment & Plan:  Assessment:  Vaginal yeast likely  Plans:  Will do Diflucan 150 mg every other day for 5 days HIV,  RPR, HgbA1c, GC, Chlamydia Advised to decrease sweets/ limit one per day Advised Dove soap and avoid external lotions  Discussed cotton under ware and loose clothing Follow up in 2 weeks  Delbert PhenixLinda M Meoshia Billing, NP 12/16/2013 9:45 AM

## 2013-12-17 LAB — WET PREP, GENITAL
CLUE CELLS WET PREP: NONE SEEN
TRICH WET PREP: NONE SEEN
YEAST WET PREP: NONE SEEN

## 2013-12-17 LAB — HIV ANTIBODY (ROUTINE TESTING W REFLEX): HIV 1&2 Ab, 4th Generation: NONREACTIVE

## 2013-12-17 LAB — GC/CHLAMYDIA PROBE AMP
CT PROBE, AMP APTIMA: NEGATIVE
GC Probe RNA: NEGATIVE

## 2013-12-30 ENCOUNTER — Encounter: Payer: Self-pay | Admitting: Obstetrics & Gynecology

## 2013-12-30 ENCOUNTER — Ambulatory Visit (INDEPENDENT_AMBULATORY_CARE_PROVIDER_SITE_OTHER): Payer: Medicaid Other | Admitting: Obstetrics & Gynecology

## 2013-12-30 VITALS — BP 121/70 | HR 55 | Temp 97.0°F | Ht 64.0 in | Wt 162.8 lb

## 2013-12-30 DIAGNOSIS — Z113 Encounter for screening for infections with a predominantly sexual mode of transmission: Secondary | ICD-10-CM

## 2013-12-30 NOTE — Patient Instructions (Signed)
Vaginitis Vaginitis is an inflammation of the vagina. It is most often caused by a change in the normal balance of the bacteria and yeast that live in the vagina. This change in balance causes an overgrowth of certain bacteria or yeast, which causes the inflammation. There are different types of vaginitis, but the most common types are:  Bacterial vaginosis.  Yeast infection (candidiasis).  Trichomoniasis vaginitis. This is a sexually transmitted infection (STI).  Viral vaginitis.  Atropic vaginitis.  Allergic vaginitis. CAUSES  The cause depends on the type of vaginitis. Vaginitis can be caused by:  Bacteria (bacterial vaginosis).  Yeast (yeast infection).  A parasite (trichomoniasis vaginitis)  A virus (viral vaginitis).  Low hormone levels (atrophic vaginitis). Low hormone levels can occur during pregnancy, breastfeeding, or after menopause.  Irritants, such as bubble baths, scented tampons, and feminine sprays (allergic vaginitis). Other factors can change the normal balance of the yeast and bacteria that live in the vagina. These include:  Antibiotic medicines.  Poor hygiene.  Diaphragms, vaginal sponges, spermicides, birth control pills, and intrauterine devices (IUD).  Sexual intercourse.  Infection.  Uncontrolled diabetes.  A weakened immune system. SYMPTOMS  Symptoms can vary depending on the cause of the vaginitis. Common symptoms include:  Abnormal vaginal discharge.  The discharge is white, gray, or yellow with bacterial vaginosis.  The discharge is thick, white, and cheesy with a yeast infection.  The discharge is frothy and yellow or greenish with trichomoniasis.  A bad vaginal odor.  The odor is fishy with bacterial vaginosis.  Vaginal itching, pain, or swelling.  Painful intercourse.  Pain or burning when urinating. Sometimes, there are no symptoms. TREATMENT  Treatment will vary depending on the type of infection.   Bacterial  vaginosis and trichomoniasis are often treated with antibiotic creams or pills.  Yeast infections are often treated with antifungal medicines, such as vaginal creams or suppositories.  Viral vaginitis has no cure, but symptoms can be treated with medicines that relieve discomfort. Your sexual partner should be treated as well.  Atrophic vaginitis may be treated with an estrogen cream, pill, suppository, or vaginal ring. If vaginal dryness occurs, lubricants and moisturizing creams may help. You may be told to avoid scented soaps, sprays, or douches.  Allergic vaginitis treatment involves quitting the use of the product that is causing the problem. Vaginal creams can be used to treat the symptoms. HOME CARE INSTRUCTIONS   Take all medicines as directed by your caregiver.  Keep your genital area clean and dry. Avoid soap and only rinse the area with water.  Avoid douching. It can remove the healthy bacteria in the vagina.  Do not use tampons or have sexual intercourse until your vaginitis has been treated. Use sanitary pads while you have vaginitis.  Wipe from front to back. This avoids the spread of bacteria from the rectum to the vagina.  Let air reach your genital area.  Wear cotton underwear to decrease moisture buildup.  Avoid wearing underwear while you sleep until your vaginitis is gone.  Avoid tight pants and underwear or nylons without a cotton panel.  Take off wet clothing (especially bathing suits) as soon as possible.  Use mild, non-scented products. Avoid using irritants, such as:  Scented feminine sprays.  Fabric softeners.  Scented detergents.  Scented tampons.  Scented soaps or bubble baths.  Practice safe sex and use condoms. Condoms may prevent the spread of trichomoniasis and viral vaginitis. SEEK MEDICAL CARE IF:   You have abdominal pain.  You   have a fever or persistent symptoms for more than 2 3 days.  You have a fever and your symptoms suddenly  get worse. Document Released: 06/01/2007 Document Revised: 04/28/2012 Document Reviewed: 01/15/2012 ExitCare Patient Information 2014 [employer].  

## 2013-12-30 NOTE — Progress Notes (Signed)
Subjective:     Patient ID: Caitlin [name], female   DOB: 19-Sep-1965, 48 y.o.   MRN: [id number]  HPI Pt presents for f/u of vaginitis.  Re[ports that all of her sx have resolved.  She reports that [place] prev [place]   Review of Systems     Objective:   Physical Exam BP 121/70  Pulse 55  Temp(Src) 97 F (36.1 C)  Ht 5\' 4"  (1.626 m)  Wt 162 lb 12.8 oz (73.846 kg)  BMI 27.93 kg/m2  LMP 12/15/2013 Pt has interpreter in sign language  Exam deferred      Assessment:    vaginitis resolved STI screen negative     Plan:     F/u for Annual

## 2014-03-02 ENCOUNTER — Ambulatory Visit: Payer: Medicaid Other | Admitting: Internal Medicine

## 2014-05-01 ENCOUNTER — Other Ambulatory Visit (HOSPITAL_COMMUNITY)
Admission: RE | Admit: 2014-05-01 | Discharge: 2014-05-01 | Disposition: A | Discharge status: Home / Self Care | Payer: Medicaid Other | Source: Ambulatory Visit | Attending: Family Medicine | Admitting: Family Medicine

## 2014-05-01 ENCOUNTER — Encounter: Payer: Self-pay | Admitting: Family Medicine

## 2014-05-01 ENCOUNTER — Ambulatory Visit: Payer: Medicaid Other | Admitting: Internal Medicine

## 2014-05-01 ENCOUNTER — Ambulatory Visit: Payer: Medicaid Other | Attending: Family Medicine | Admitting: Family Medicine

## 2014-05-01 VITALS — BP 156/90 | HR 60 | Temp 98.7°F | Resp 18 | Ht 64.0 in | Wt 162.6 lb

## 2014-05-01 DIAGNOSIS — N760 Acute vaginitis: Secondary | ICD-10-CM | POA: Diagnosis present

## 2014-05-01 DIAGNOSIS — Z124 Encounter for screening for malignant neoplasm of cervix: Secondary | ICD-10-CM | POA: Diagnosis not present

## 2014-05-01 DIAGNOSIS — I10 Essential (primary) hypertension: Secondary | ICD-10-CM | POA: Diagnosis not present

## 2014-05-01 DIAGNOSIS — R109 Unspecified abdominal pain: Secondary | ICD-10-CM | POA: Insufficient documentation

## 2014-05-01 DIAGNOSIS — Z113 Encounter for screening for infections with a predominantly sexual mode of transmission: Secondary | ICD-10-CM | POA: Diagnosis not present

## 2014-05-01 DIAGNOSIS — L659 Nonscarring hair loss, unspecified: Secondary | ICD-10-CM | POA: Insufficient documentation

## 2014-05-01 DIAGNOSIS — Z01419 Encounter for gynecological examination (general) (routine) without abnormal findings: Secondary | ICD-10-CM | POA: Insufficient documentation

## 2014-05-01 DIAGNOSIS — Z1151 Encounter for screening for human papillomavirus (HPV): Secondary | ICD-10-CM | POA: Insufficient documentation

## 2014-05-01 DIAGNOSIS — B351 Tinea unguium: Secondary | ICD-10-CM | POA: Diagnosis not present

## 2014-05-01 DIAGNOSIS — Z0000 Encounter for general adult medical examination without abnormal findings: Secondary | ICD-10-CM

## 2014-05-01 NOTE — Progress Notes (Signed)
Establish care Annual physical and pap Pt stated has pain lt side from upper chest and lower extremity Complaining of color change in forehead area

## 2014-05-01 NOTE — Assessment & Plan Note (Signed)
A: R foot.  P: CMP, CBC Plan for terbinafine x 8-12 weeks

## 2014-05-01 NOTE — Assessment & Plan Note (Signed)
A: at temple. 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

## 2014-05-01 NOTE — Assessment & Plan Note (Signed)
A: BP elevated above goal. Normal last two checks P: F/u in two weeks for repeat BP  Increase lisinopril if BP above goal CMP, TSH, CBC today

## 2014-05-01 NOTE — Patient Instructions (Signed)
Ms. Mccaffrey,  Thank you for coming in today. It was a pleasure meeting you. I look forward to be a primary doctor.  1. You will be called with results of pap smear and testing.  2. For hair loss: I recommend biotin 1000 mg daily. This can be found in the over the counter medication, hair, skin and nail. There are many gummie options on the market, I recommend [employer] or [employer]. Checking a TSH today to check thyroid function.  3. For toe nail fungus: checking liver function today. As long at it is negative we can plan a 8-12 week course of Lamisil.   4. For R side pain: exam is normal. Checking electrolytes. Drink plenty of water. If you do not already, I recommend regular strength training upper body,  exercise, 3-5 lb weights. Schedule a focused f/u visit if pain persist.   5. Hypertension: BP elevated today. It was at goal at your last two visits. Please return in two weeks for f/u BP check with the nurse.   Dr. Armen Pickup

## 2014-05-01 NOTE — Assessment & Plan Note (Signed)
Pap done today  

## 2014-05-01 NOTE — Progress Notes (Signed)
   Subjective:    Patient ID: Caitlin [name], female    DOB: July 12, 1966, 48 y.o.   MRN: [id number] CC: well woman exam  HPI Complaint/concern: R sided cramping originating from R flank, intermittent x 2 months.   Soc hx: chronic non smoker  Review of Systems General:  Negative for unexplained weight loss, fever Skin: Negative for new or changing mole, sore that won't heal HEENT:  Positive for temporal hair loss. Negative for trouble hearing, trouble seeing, ringing in ears, mouth sores, hoarseness, change in voice, dysphagia.  CV:  Negative for chest pain, dyspnea, edema, palpitations Resp: Negative for cough, dyspnea, hemoptysis GI: Negative for nausea, vomiting, diarrhea, constipation, abdominal pain, melena, hematochezia. GU: Negative for dysuria, incontinence, urinary hesitance, hematuria, vaginal or penile discharge, polyuria, sexual difficulty, lumps in testicle or breasts MSK: Positive for R flank cramps intermittent.  Negative for  joint pain or swelling Neuro: Negative for headaches, weakness, numbness, dizziness, passing out/fainting Psych: Negative for depression, anxiety, memory problems  Soc Hx: non smoker     Objective:   Physical Exam BP 156/90  Pulse 60  Temp(Src) 98.7 F (37.1 C) (Oral)  Resp 18  Ht  (1.626 m)  Wt 162 lb 9.6 oz (73.755 kg)  BMI 27.90 kg/m2  SpO2 100%  LMP 02/15/2014  General Appearance:    Alert, cooperative, no distress, appears stated age  Head:    Normocephalic, without obvious abnormality, atraumatic  Eyes:    PERRL, conjunctiva/corneas clear, EOM's intact both eyes  Ears:    Normal TM's and external ear canals, both ears  Nose:   Nares normal, septum midline, mucosa normal, no drainage    or sinus tenderness  Throat:   Lips, mucosa, and tongue normal; teeth and gums normal  Neck:   Supple, symmetrical, trachea midline, no adenopathy;    thyroid:  no enlargement/tenderness/nodules; no carotid   bruit or JVD  Back:      Symmetric, no curvature, ROM normal, no CVA tenderness  Lungs:     Clear to auscultation bilaterally, respirations unlabored  Chest Wall:    No tenderness or deformity   Heart:    Regular rate and rhythm, S1 and S2 normal, no murmur, rub   or gallop  Breast Exam:    No tenderness, masses, or nipple abnormality  Abdomen:     Soft, non-tender, bowel sounds active all four quadrants,    no masses, no organomegaly  Genitalia:    Normal female without lesion, discharge or tenderness  Rectal:    Not done   Extremities:   Extremities normal, atraumatic, no cyanosis or edema  Pulses:   2+ and symmetric all extremities  Skin:   Skin color, texture, turgor normal, no rashes or lesions Thickened and darkened toenails R foot   Lymph nodes:   Cervical, supraclavicular, and axillary nodes normal  Neurologic:   CNII-XII intact, normal strength, sensation and reflexes    throughout      Assessment & Plan:

## 2014-05-02 LAB — COMPLETE METABOLIC PANEL WITH GFR
ALT: 10 U/L (ref 0–35)
AST: 15 U/L (ref 0–37)
Albumin: 4.2 g/dL (ref 3.5–5.2)
Alkaline Phosphatase: 63 U/L (ref 39–117)
BILIRUBIN TOTAL: 0.5 mg/dL (ref 0.2–1.2)
BUN: 16 mg/dL (ref 6–23)
CO2: 30 meq/L (ref 19–32)
CREATININE: 0.86 mg/dL (ref 0.50–1.10)
Calcium: 9.6 mg/dL (ref 8.4–10.5)
Chloride: 101 mEq/L (ref 96–112)
GFR, EST NON AFRICAN AMERICAN: 81 mL/min
GLUCOSE: 66 mg/dL — AB (ref 70–99)
Potassium: 4.3 mEq/L (ref 3.5–5.3)
SODIUM: 139 meq/L (ref 135–145)
TOTAL PROTEIN: 7.6 g/dL (ref 6.0–8.3)

## 2014-05-02 LAB — CBC
HCT: 37 % (ref 36.0–46.0)
HEMOGLOBIN: 12.1 g/dL (ref 12.0–15.0)
MCH: 29.4 pg (ref 26.0–34.0)
MCHC: 32.7 g/dL (ref 30.0–36.0)
MCV: 89.8 fL (ref 78.0–100.0)
Platelets: 383 10*3/uL (ref 150–400)
RBC: 4.12 MIL/uL (ref 3.87–5.11)
RDW: 13.4 % (ref 11.5–15.5)
WBC: 4.8 10*3/uL (ref 4.0–10.5)

## 2014-05-02 LAB — TSH: TSH: 1.9 u[IU]/mL (ref 0.350–4.500)

## 2014-05-02 LAB — CYTOLOGY - PAP

## 2014-05-03 ENCOUNTER — Telehealth: Payer: Self-pay | Admitting: *Deleted

## 2014-05-03 NOTE — Telephone Encounter (Signed)
Message copied by Dyann Kief on Wed May 03, 2014  2:24 PM ------      Message from: Dessa Phi      Created: Tue May 02, 2014  3:22 PM       Negative GC/Chlam ------

## 2014-05-03 NOTE — Telephone Encounter (Signed)
Pt aware GC/Chlamedia negative

## 2014-05-03 NOTE — Telephone Encounter (Signed)
Unable to contact pt, no voice mail 

## 2014-05-08 ENCOUNTER — Telehealth: Payer: Self-pay | Admitting: *Deleted

## 2014-05-08 NOTE — Telephone Encounter (Signed)
Pt aware of Pap smear results 

## 2014-05-08 NOTE — Telephone Encounter (Signed)
Message copied by Dyann Kief on Mon May 08, 2014 12:41 PM ------      Message from: Dessa Phi      Created: Thu May 04, 2014  1:49 PM       Negative pap, repeat in 3 years ------

## 2014-05-08 NOTE — Telephone Encounter (Signed)
Message copiDyann Kief Jestina Stephani M on Mon May 08, 2014 12:35 PM ------      Message from: Dessa Phi      Created: Thu May 04, 2014  1:49 PM       Negative pap, repeat in 3 years ------

## 2014-05-22 ENCOUNTER — Ambulatory Visit: Payer: Medicaid Other | Attending: Family Medicine

## 2014-05-22 VITALS — BP 127/65 | HR 66 | Temp 98.6°F | Resp 16

## 2014-05-22 DIAGNOSIS — Z23 Encounter for immunization: Secondary | ICD-10-CM

## 2014-05-22 MED ORDER — LISINOPRIL-HYDROCHLOROTHIAZIDE 20-25 MG PO TABS: 1.0000 | ORAL_TABLET | Freq: Every day | ORAL | Status: DC

## 2014-05-22 MED ORDER — PANTOPRAZOLE SODIUM 40 MG PO TBEC: 40.0000 mg | DELAYED_RELEASE_TABLET | Freq: Every day | ORAL | Status: DC

## 2014-05-22 NOTE — Progress Notes (Unsigned)
Patient ID: Caitlin KannerDana Yvette [name], female   DOB: April 26, 1966, 48 y.o.   MRN: [id number] Pt comes in for blood pressure recheck with taking prescribed Lisinopril-HCTZ daily States she has been complaint with medications Denies blurry vision,dizziness or headaches BP- 127/85 66 controlled with medication C/o occasionally leg cramping after starting medication Flu vaccine requested

## 2014-05-22 NOTE — Patient Instructions (Signed)
Continue taking prescribed medication daily Keep blood pressure log and return with next visit Medications sent to [facility] pharmacy [place] [facility] if cramping in legs worsens DASH Eating Plan DASH stands for "Dietary Approaches to Stop Hypertension." The DASH eating plan is a healthy eating plan that has been shown to reduce high blood pressure (hypertension). Additional health benefits may include reducing the risk of type 2 diabetes mellitus, heart disease, and stroke. The DASH eating plan may also help with weight loss. WHAT DO I NEED TO KNOW ABOUT THE DASH EATING PLAN? For the DASH eating plan, you will follow these general guidelines:  Choose foods with a percent daily value for sodium of less than 5% (as listed on the food label).  Use salt-free seasonings or herbs instead of table salt or sea salt.  Check with your health care provider or pharmacist before using salt substitutes.  Eat lower-sodium products, often labeled as "lower sodium" or "no salt added."  Eat fresh foods.  Eat more vegetables, fruits, and low-fat dairy products.  Choose whole grains. Look for the word "whole" as the first word in the ingredient list.  Choose fish and skinless chicken or [place] more often than red meat. Limit fish, poultry, and meat to 6 oz (170 g) each day.  Limit sweets, desserts, sugars, and sugary drinks.  Choose heart-healthy fats.  Limit cheese to 1 oz (28 g) per day.  Eat more home-cooked food and less restaurant, buffet, and fast food.  Limit fried foods.  Cook foods using methods other than frying.  Limit canned vegetables. If you do use them, rinse them well to decrease the sodium.  When eating at a restaurant, ask that your food be prepared with less salt, or no salt if possible. WHAT FOODS CAN I EAT? Seek help from a dietitian for individual calorie needs. Grains Whole grain or whole wheat bread. Brown rice. Whole grain or whole wheat pasta. Quinoa, bulgur, and  whole grain cereals. Low-sodium cereals. Corn or whole wheat flour tortillas. Whole grain cornbread. Whole grain crackers. Low-sodium crackers. Vegetables Fresh or frozen vegetables (raw, steamed, roasted, or grilled). Low-sodium or reduced-sodium tomato and vegetable juices. Low-sodium or reduced-sodium tomato sauce and paste. Low-sodium or reduced-sodium canned vegetables.  Fruits All fresh, canned (in natural juice), or frozen fruits. Meat and Other Protein Products Ground beef (85% or leaner), grass-fed beef, or beef trimmed of fat. Skinless chicken or [place]. Ground chicken or [place]. Pork trimmed of fat. All fish and seafood. Eggs. Dried beans, peas, or lentils. Unsalted nuts and seeds. Unsalted canned beans. Dairy Low-fat dairy products, such as skim or 1% milk, 2% or reduced-fat cheeses, low-fat ricotta or cottage cheese, or plain low-fat yogurt. Low-sodium or reduced-sodium cheeses. Fats and Oils Tub margarines without trans fats. Light or reduced-fat mayonnaise and salad dressings (reduced sodium). Avocado. Safflower, olive, or canola oils. Natural peanut or almond butter. Other Unsalted popcorn and pretzels. The items listed above may not be a complete list of recommended foods or beverages. Contact your dietitian for more options. WHAT FOODS ARE NOT RECOMMENDED? Grains White bread. White pasta. White rice. Refined cornbread. Bagels and croissants. Crackers that contain trans fat. Vegetables Creamed or fried vegetables. Vegetables in a cheese sauce. Regular canned vegetables. Regular canned tomato sauce and paste. Regular tomato and vegetable juices. Fruits Dried fruits. Canned fruit in light or heavy syrup. Fruit juice. Meat and Other Protein Products Fatty cuts of meat. Ribs, chicken wings, bacon, sausage, bologna, salami, chitterlings, fatback, hot dogs,  bratwurst, and packaged luncheon meats. Salted nuts and seeds. Canned beans with salt. Dairy Whole or 2% milk, cream,  half-and-half, and cream cheese. Whole-fat or sweetened yogurt. Full-fat cheeses or blue cheese. Nondairy creamers and whipped toppings. Processed cheese, cheese spreads, or cheese curds. Condiments Onion and garlic salt, seasoned salt, table salt, and sea salt. Canned and packaged gravies. Worcestershire sauce. Tartar sauce. Barbecue sauce. Teriyaki sauce. Soy sauce, including reduced sodium. Steak sauce. Fish sauce. Oyster sauce. Cocktail sauce. Horseradish. Ketchup and mustard. Meat flavorings and tenderizers. Bouillon cubes. Hot sauce. Tabasco sauce. Marinades. Taco seasonings. Relishes. Fats and Oils Butter, stick margarine, lard, shortening, ghee, and bacon fat. Coconut, palm kernel, or palm oils. Regular salad dressings. Other Pickles and olives. Salted popcorn and pretzels. The items listed above may not be a complete list of foods and beverages to avoid. Contact your dietitian for more information. WHERE CAN I FIND MORE INFORMATION? National Heart, Lung, and Blood Institute: CablePromo.itwww.nhlbi.nih.gov/health/health-topics/topics/dash/ Document Released: 07/24/2011 Document Revised: 12/19/2013 Document Reviewed: 06/08/2013 [facility] Patient Information 2015 [place], [place]. This information is not intended to replace advice given to you by your health care provider. Make sure you discuss any questions you have with your health care provider.

## 2014-07-27 ENCOUNTER — Ambulatory Visit: Payer: Medicaid Other | Attending: Family Medicine | Admitting: Family Medicine

## 2014-07-27 ENCOUNTER — Encounter: Payer: Self-pay | Admitting: Family Medicine

## 2014-07-27 VITALS — BP 133/83 | HR 85 | Temp 98.3°F | Resp 16 | Ht 63.0 in | Wt 164.0 lb

## 2014-07-27 DIAGNOSIS — Z9851 Tubal ligation status: Secondary | ICD-10-CM | POA: Diagnosis not present

## 2014-07-27 DIAGNOSIS — N926 Irregular menstruation, unspecified: Secondary | ICD-10-CM | POA: Insufficient documentation

## 2014-07-27 DIAGNOSIS — K219 Gastro-esophageal reflux disease without esophagitis: Secondary | ICD-10-CM | POA: Diagnosis not present

## 2014-07-27 DIAGNOSIS — N951 Menopausal and female climacteric states: Secondary | ICD-10-CM

## 2014-07-27 LAB — TSH: TSH: 1.205 u[IU]/mL (ref 0.350–4.500)

## 2014-07-27 LAB — POCT URINE PREGNANCY: PREG TEST UR: NEGATIVE

## 2014-07-27 MED ORDER — VENLAFAXINE HCL 37.5 MG PO TABS: 37.5000 mg | ORAL_TABLET | Freq: Two times a day (BID) | ORAL | Status: DC

## 2014-07-27 MED ORDER — PANTOPRAZOLE SODIUM 40 MG PO TBEC: 40.0000 mg | DELAYED_RELEASE_TABLET | Freq: Every day | ORAL | Status: DC

## 2014-07-27 NOTE — Assessment & Plan Note (Signed)
  2. GERD: continue protonix.

## 2014-07-27 NOTE — Progress Notes (Signed)
Complaining of hot flashes on and off Irregular menses

## 2014-07-27 NOTE — Patient Instructions (Signed)
Caitlin [name],  Thank you for coming in today.   1. Hot flashes and irregular periods consistet with perimenopause.  Try to identify and avoid any food causes (triggers) Take effexor 37.5 mg twice daily with goal of lessening the number and severity of hot flashes.   2. GERD: continue protonix.   We will can and mail lab results.   F/u in 6 weeks for perimenopause.   Dr. Armen PickupFunches   Perimenopause Perimenopause is the time when your body begins to move into the menopause (no menstrual period for 12 straight months). It is a natural process. Perimenopause can begin 2-8 years before the menopause and usually lasts for 1 year after the menopause. During this time, your ovaries may or may not produce an egg. The ovaries vary in their production of estrogen and progesterone hormones each month. This can cause irregular menstrual periods, difficulty getting pregnant, vaginal bleeding between periods, and uncomfortable symptoms. CAUSES  Irregular production of the ovarian hormones, estrogen and progesterone, and not ovulating every month.  Other causes include:  Tumor of the pituitary gland in the brain.  Medical disease that affects the ovaries.  Radiation treatment.  Chemotherapy.  Unknown causes.  Heavy smoking and excessive alcohol intake can bring on perimenopause sooner. SIGNS AND SYMPTOMS   Hot flashes.  Night sweats.  Irregular menstrual periods.  Decreased sex drive.  Vaginal dryness.  Headaches.  Mood swings.  Depression.  Memory problems.  Irritability.  Tiredness.  Weight gain.  Trouble getting pregnant.  The beginning of losing bone cells (osteoporosis).  The beginning of hardening of the arteries (atherosclerosis). DIAGNOSIS  Your health care provider will make a diagnosis by analyzing your age, menstrual history, and symptoms. He or she will do a physical exam and note any changes in your body, especially your female organs. Female hormone tests  may or may not be helpful depending on the amount of female hormones you produce and when you produce them. However, other hormone tests may be helpful to rule out other problems. TREATMENT  In some cases, no treatment is needed. The decision on whether treatment is necessary during the perimenopause should be made by you and your health care provider based on how the symptoms are affecting you and your lifestyle. Various treatments are available, such as:  Treating individual symptoms with a specific medicine for that symptom.  Herbal medicines that can help specific symptoms.  Counseling.  Group therapy. HOME CARE INSTRUCTIONS   Keep track of your menstrual periods (when they occur, how heavy they are, how long between periods, and how long they last) as well as your symptoms and when they started.  Only take over-the-counter or prescription medicines as directed by your health care provider.  Sleep and rest.  Exercise.  Eat a diet that contains calcium (good for your bones) and soy (acts like the estrogen hormone).  Do not smoke.  Avoid alcoholic beverages.  Take vitamin supplements as recommended by your health care provider. Taking vitamin E may help in certain cases.  Take calcium and vitamin D supplements to help prevent bone loss.  Group therapy is sometimes helpful.  Acupuncture may help in some cases. SEEK MEDICAL CARE IF:   You have questions about any symptoms you are having.  You need a referral to a specialist (gynecologist, psychiatrist, or psychologist). SEEK IMMEDIATE MEDICAL CARE IF:   You have vaginal bleeding.  Your period lasts longer than 8 days.  Your periods are recurring sooner than 21 days.  You have bleeding after intercourse.  You have severe depression.  You have pain when you urinate.  You have severe headaches.  You have vision problems. Document Released: 09/11/2004 Document Revised: 05/25/2013 Document Reviewed:  03/03/2013 [facility] Patient Information 2015 [place], [place]. This information is not intended to replace advice given to you by your health care provider. Make sure you discuss any questions you have with your health care provider.

## 2014-07-27 NOTE — Assessment & Plan Note (Signed)
1. Hot flashes and irregular periods consistet with perimenopause.  Try to identify and avoid any food causes (triggers) Take effexor 37.5 mg twice daily with goal of lessening the number and severity of hot flashes.    F/u in 6 weeks for perimenopause.

## 2014-07-27 NOTE — Progress Notes (Signed)
   Subjective:    Patient ID: Caitlin [name], female    DOB: 02/19/1966, 48 y.o.   MRN: [id number] CC: intermittent hot flashes and irregular menses  HPI 48 yo deaf female presents for f/u visit: She is accompanied by her husband and ASL interpreter  1. Perimenopausal symptoms: x 2 weeks. Started right after thanksgiving. Hot flashes 10 x per day and countless throughout the night. Poor sleep. Day time fatigue. Nausea. Irregular menses. She previously had a period once a month. Now she is bleeding off and on for the past month for about 3 days, scant bleeding, stop, restart. Does not know when her mother went through menopause.   Soc hx: chronic non smoker  Surg Hx: s/p BTL  Review of Systems As per HPI     Objective:   Physical Exam BP 133/83 mmHg  Pulse 85  Temp(Src) 98.3 F (36.8 C) (Oral)  Resp 16  Ht 5\' 3"  (1.6 m)  Wt 164 lb (74.39 kg)  BMI 29.06 kg/m2  SpO2 99%  LMP 06/18/2014 General appearance: alert, cooperative and no distress Throat: lips, mucosa, and tongue normal; teeth and gums normal Neck: no adenopathy and thyroid not enlarged, symmetric, no tenderness/mass/nodules Lungs: clear to auscultation bilaterally Heart: regular rate and rhythm, S1, S2 normal, no murmur, click, rub or gallop  U preg: negative      Assessment & Plan:

## 2014-07-28 LAB — FSH/LH
FSH: 120.8 m[IU]/mL — ABNORMAL HIGH
LH: 74.3 m[IU]/mL

## 2014-08-15 ENCOUNTER — Telehealth: Payer: Self-pay | Admitting: *Deleted

## 2014-08-15 NOTE — Telephone Encounter (Signed)
-----   Message from Lora PaulaJosalyn C Funches, MD sent at 07/28/2014  9:24 AM EST ----- Normal TSH, thyroid test. High FSH and LH confirming perimenopausal state.

## 2014-08-15 NOTE — Telephone Encounter (Signed)
Unable to contact Pt, connection was lost.

## 2014-11-06 ENCOUNTER — Telehealth: Payer: Self-pay | Admitting: Family Medicine

## 2014-11-06 ENCOUNTER — Telehealth: Payer: Self-pay | Admitting: *Deleted

## 2014-11-06 DIAGNOSIS — N951 Menopausal and female climacteric states: Secondary | ICD-10-CM

## 2014-11-06 MED ORDER — VENLAFAXINE HCL 37.5 MG PO TABS: 37.5000 mg | ORAL_TABLET | Freq: Two times a day (BID) | ORAL | Status: DC

## 2014-11-06 NOTE — Telephone Encounter (Signed)
Left message to return call 

## 2014-11-06 NOTE — Telephone Encounter (Signed)
   °  Pt called requesting to speak to nurse, pt has been having hot flashes and heavy menses. Please f/u with pt

## 2014-11-06 NOTE — Telephone Encounter (Signed)
   °

## 2014-11-06 NOTE — Telephone Encounter (Signed)
Pt stated menses start today  [facility]

## 2014-11-09 ENCOUNTER — Ambulatory Visit: Payer: Medicaid Other | Admitting: Family Medicine

## 2014-11-20 ENCOUNTER — Ambulatory Visit: Payer: Medicaid Other | Attending: Family Medicine | Admitting: Family Medicine

## 2014-11-20 ENCOUNTER — Encounter: Payer: Self-pay | Admitting: Family Medicine

## 2014-11-20 VITALS — BP 140/90 | HR 70 | Temp 98.5°F | Resp 16 | Ht 63.0 in | Wt 178.0 lb

## 2014-11-20 DIAGNOSIS — E66811 Obesity, class 1: Secondary | ICD-10-CM | POA: Insufficient documentation

## 2014-11-20 DIAGNOSIS — Z6831 Body mass index (BMI) 31.0-31.9, adult: Secondary | ICD-10-CM | POA: Diagnosis not present

## 2014-11-20 DIAGNOSIS — R232 Flushing: Secondary | ICD-10-CM | POA: Diagnosis not present

## 2014-11-20 DIAGNOSIS — I10 Essential (primary) hypertension: Secondary | ICD-10-CM | POA: Insufficient documentation

## 2014-11-20 DIAGNOSIS — N926 Irregular menstruation, unspecified: Secondary | ICD-10-CM

## 2014-11-20 DIAGNOSIS — N951 Menopausal and female climacteric states: Secondary | ICD-10-CM | POA: Insufficient documentation

## 2014-11-20 DIAGNOSIS — R635 Abnormal weight gain: Secondary | ICD-10-CM | POA: Insufficient documentation

## 2014-11-20 DIAGNOSIS — E669 Obesity, unspecified: Secondary | ICD-10-CM | POA: Diagnosis not present

## 2014-11-20 LAB — POCT URINE PREGNANCY: Preg Test, Ur: NEGATIVE

## 2014-11-20 MED ORDER — VENLAFAXINE HCL 37.5 MG PO TABS: 37.5000 mg | ORAL_TABLET | Freq: Every day | ORAL | Status: DC

## 2014-11-20 MED ORDER — LISINOPRIL-HYDROCHLOROTHIAZIDE 20-25 MG PO TABS: 1.0000 | ORAL_TABLET | Freq: Every day | ORAL | Status: DC

## 2014-11-20 NOTE — Progress Notes (Signed)
   Subjective:    Patient ID: Caitlin [name], female    DOB: May 10, 1966, 49 y.o.   MRN: [id number] CC: f/u hotflashes, weight gain, HTN  HPI  2. CHRONIC HYPERTENSION  Disease Monitoring  Blood pressure range: does not check   Chest pain: no   Dyspnea: no   Claudication: no   Medication compliance: yes  Medication Side Effects  Lightheadedness: no   Urinary frequency: no   Edema: no   Preventitive Healthcare:  Exercise: no   Diet Pattern: eats high calorie meals   Salt Restriction: no   2. Hot flashes: improved with effexor. effexor is causing some GI upset. She would prefer to take it once daily. She has improvement in insomnia.   3. Weight gain: has gained. Eating poorly. Taking effexor. Daughter works at [employer] and patient is eating high calorie, high salt foods.   Soc Hx: non smoker  Review of Systems  Constitutional: Negative for fever.  Respiratory: Negative for shortness of breath.   Cardiovascular: Negative.  Negative for chest pain, palpitations and leg swelling.  MSK: intermittent L bicep pain and cramping at night     Objective:   Physical Exam BP 140/90 mmHg  Pulse 70  Temp(Src) 98.5 F (36.9 C) (Oral)  Resp 16  Ht 5\' 3"  (1.6 m)  Wt 178 lb (80.74 kg)  BMI 31.54 kg/m2  SpO2 100%  LMP 11/19/2014 General appearance: alert, cooperative and no distress Lungs: clear to auscultation bilaterally Heart: regular rate and rhythm, S1, S2 normal, no murmur, click, rub or gallop  Ext: non tender, no edema        Assessment & Plan:

## 2014-11-20 NOTE — Assessment & Plan Note (Signed)
A; improved, would like a trial on lower dose of effexor P: decrease effexor to 37.5 mg once daily with supper

## 2014-11-20 NOTE — Assessment & Plan Note (Signed)
A: BP at goal Med: compliant P: refill prinzide

## 2014-11-20 NOTE — Patient Instructions (Signed)
Mrs. Caitlin [name],    1. Menopausal symptoms: take effexor once with supper If you decided to stop completely, taper, by taking very other night for 4 days, then every third night for 9 days, then ever 4 nights for 8 days, then STOP.   2. Weight loss: Nutrition referral Drink water as main beverage Use plate method for meals   Dr. Armen PickupFunches

## 2014-11-20 NOTE — Progress Notes (Signed)
Complaining of irregular menses Heavy menses and irregular since November  Stated do not like how medication make her feel Feeling hot flushes and to calm when taking Rx Venlafaxine

## 2014-11-20 NOTE — Assessment & Plan Note (Signed)
A: Weight loss: gaining weight, poor dietary habits P: Nutrition referral Drink water as main beverage Use plate method for meals

## 2014-12-12 ENCOUNTER — Other Ambulatory Visit: Payer: Self-pay | Admitting: Internal Medicine

## 2014-12-26 ENCOUNTER — Ambulatory Visit: Payer: Medicaid Other | Admitting: Dietician

## 2015-01-25 ENCOUNTER — Encounter: Payer: Medicaid Other | Attending: Family Medicine | Admitting: Dietician

## 2015-01-25 VITALS — Ht 64.0 in | Wt 180.1 lb

## 2015-01-25 DIAGNOSIS — Z6830 Body mass index (BMI) 30.0-30.9, adult: Secondary | ICD-10-CM | POA: Diagnosis not present

## 2015-01-25 DIAGNOSIS — Z713 Dietary counseling and surveillance: Secondary | ICD-10-CM | POA: Insufficient documentation

## 2015-01-25 DIAGNOSIS — E669 Obesity, unspecified: Secondary | ICD-10-CM | POA: Diagnosis not present

## 2015-01-25 NOTE — Progress Notes (Signed)
  Medical Nutrition Therapy:  Appt start time: 310 end time:  410   Assessment:  Primary concerns today: Caitlin [name] is here today with her boyfriend reporting she started menopause and she has since had difficulty losing weight. Her usual body weight has been 140 lbs. Caitlin [name] takes care of her grandchildren frequently and usually eats what they eat. Her grandchildren stay with her on the weekends and sometimes during the week. She tries to get in meals at appropriate times but has a hard time following meal structure. She states this is due to financial reasons. Her daughter works at [employer] and brings home food frequently. However, Caitlin [name] has recently asked her to stop bringing home pizza. Caitlin [name] lives with her daughter, her boyfriend, and her daughter's boyfriend. Her boyfriend does the grocery shopping.   The appointment was translated by Sign Language Interpreter, Karle Plumber.  Preferred Learning Style:  No preference indicated   Learning Readiness:   Ready   MEDICATIONS: see list   DIETARY INTAKE:  Avoided foods include eggs, milk (can tolerate cheese and yogurt sometimes).    24-hr recall: Wakes up between 9-11am  B ( AM): frozen grilled chicken sandwich from [employer] or pizza  Snk ( AM):   L ( PM): *see breakfast, chips and soda Snk ( PM): chips or candy D ( PM): *see breakfast and lunch OR chicken nuggets and fries OR fast food Snk ( PM): recently has been trying to stop snacking in the evenings  Beverages: soda (about 2 cups per day), water, lemonade, beer sometimes  Usual physical activity: walking, taking stairs (inconsistent)  Estimated energy needs: 1600-1800 calories  Progress Towards Goal(s):  In progress.   Nutritional Diagnosis:  [place]-3.3 Overweight/obesity As related to excessive energy intake, inappropriate food choices, and physical inactivity.  As evidenced by BMI 31.    Intervention:  Nutrition counseling provided. Goals: -Fill up on non starchy vegetables  (any vegetable that is not corn, peas, or potatoes)  -Veggies are good raw or cooked, fresh or frozen -Continue to choose lean meats like [place], chicken, and seafood  -Avoid frying meats -Determine the serving size of snack foods ahead of time, portion them into a bowl, and put the bag away -Practice mindful eating by having meals at the table with family (without distractions like TV or phones) -Aim for 64 ounces of sugar free fluid  -Consider adding sugar free flavoring to water  -Add cut up fruit to water  -Sparkling water  -Limit sodas -Continue limiting beer to just a few a week (1-2 at a time)  -Choose low sodium version of Ramen and try not to use the whole seasoning packet -Increase physical activity  -Walking around apartment complex  Teaching Method Utilized:  Visual   Handouts given during visit include:  Meal planning card  MyPlate  Barriers to learning/adherence to lifestyle change: financial constraints  Demonstrated degree of understanding via:  Teach Back   Monitoring/Evaluation:  Dietary intake, exercise, mindful eating, and body weight in 3 month(s).

## 2015-01-25 NOTE — Patient Instructions (Addendum)
-  Fill up on non starchy vegetables (any vegetable that is not corn, peas, or potatoes)  -Veggies are good raw or cooked, fresh or frozen  -Continue to choose lean meats like [place], chicken, and seafood  -Avoid frying meats  -Determine the serving size of snack foods ahead of time, portion them into a bowl, and put the bag away  -Practice mindful eating by having meals at the table with family (without distractions like TV or phones)  -Aim for 64 ounces of sugar free fluid  -Consider adding sugar free flavoring to water  -Add cut up fruit to water  -Sparkling water  -Limit sodas  -Continue limiting beer to just a few a week (1-2 at a time)   -Choose low sodium version of Ramen and try not to use the whole seasoning packet  -Increase physical activity!  -Walking around apartment complex

## 2015-04-25 ENCOUNTER — Ambulatory Visit: Payer: Medicaid Other | Admitting: Dietician

## 2015-05-14 ENCOUNTER — Ambulatory Visit: Payer: Medicaid Other | Admitting: Dietician

## 2015-08-02 ENCOUNTER — Ambulatory Visit: Payer: Medicaid Other | Admitting: Family Medicine

## 2015-09-28 ENCOUNTER — Ambulatory Visit: Payer: Medicaid Other | Attending: Family Medicine | Admitting: Family Medicine

## 2015-09-28 ENCOUNTER — Encounter: Payer: Self-pay | Admitting: Family Medicine

## 2015-09-28 VITALS — BP 160/90 | HR 78 | Temp 98.4°F | Resp 16 | Ht 63.0 in | Wt 176.0 lb

## 2015-09-28 DIAGNOSIS — Z0000 Encounter for general adult medical examination without abnormal findings: Secondary | ICD-10-CM

## 2015-09-28 DIAGNOSIS — I10 Essential (primary) hypertension: Secondary | ICD-10-CM | POA: Diagnosis not present

## 2015-09-28 DIAGNOSIS — Z79899 Other long term (current) drug therapy: Secondary | ICD-10-CM | POA: Diagnosis not present

## 2015-09-28 DIAGNOSIS — R05 Cough: Secondary | ICD-10-CM | POA: Insufficient documentation

## 2015-09-28 DIAGNOSIS — J069 Acute upper respiratory infection, unspecified: Secondary | ICD-10-CM | POA: Diagnosis not present

## 2015-09-28 DIAGNOSIS — R0600 Dyspnea, unspecified: Secondary | ICD-10-CM | POA: Insufficient documentation

## 2015-09-28 DIAGNOSIS — B9789 Other viral agents as the cause of diseases classified elsewhere: Secondary | ICD-10-CM

## 2015-09-28 LAB — COMPLETE METABOLIC PANEL WITH GFR
ALT: 10 U/L (ref 6–29)
AST: 14 U/L (ref 10–35)
Albumin: 3.9 g/dL (ref 3.6–5.1)
Alkaline Phosphatase: 74 U/L (ref 33–115)
BUN: 15 mg/dL (ref 7–25)
CO2: 28 mmol/L (ref 20–31)
Calcium: 9.1 mg/dL (ref 8.6–10.2)
Chloride: 103 mmol/L (ref 98–110)
Creat: 0.81 mg/dL (ref 0.50–1.10)
GFR, EST NON AFRICAN AMERICAN: 86 mL/min (ref >=60)
Glucose, Bld: 127 mg/dL — ABNORMAL HIGH (ref 65–99)
POTASSIUM: 4.1 mmol/L (ref 3.5–5.3)
SODIUM: 140 mmol/L (ref 135–146)
Total Bilirubin: 0.4 mg/dL (ref 0.2–1.2)
Total Protein: 7.2 g/dL (ref 6.1–8.1)

## 2015-09-28 LAB — POCT GLYCOSYLATED HEMOGLOBIN (HGB A1C): HEMOGLOBIN A1C: 5.1

## 2015-09-28 MED ORDER — LISINOPRIL-HYDROCHLOROTHIAZIDE 20-25 MG PO TABS: 1.0000 | ORAL_TABLET | Freq: Every day | ORAL | Status: DC

## 2015-09-28 MED ORDER — AMOXICILLIN 500 MG PO CAPS: 500.0000 mg | ORAL_CAPSULE | Freq: Three times a day (TID) | ORAL | Status: AC

## 2015-09-28 MED ORDER — AMOXICILLIN 500 MG PO CAPS: 500.0000 mg | ORAL_CAPSULE | Freq: Three times a day (TID) | ORAL | Status: DC

## 2015-09-28 NOTE — Patient Instructions (Addendum)
Caitlin [name] was seen today for hypertension.  Diagnoses and all orders for this visit:  Healthcare maintenance -     POCT glycosylated hemoglobin (Hb A1C)  Essential hypertension -     COMPLETE METABOLIC PANEL WITH GFR -     lisinopril-hydrochlorothiazide (PRINZIDE,ZESTORETIC) 20-25 MG tablet; Take 1 tablet by mouth daily.  Viral URI with cough -     amoxicillin (AMOXIL) 500 MG capsule; Take 1 capsule (500 mg total) by mouth 3 (three) times daily.   You have a viral URI with cough. For this please do the following:  1. Drink plenty of fluids. Hot tea, soup etc will help open your nasal passages. 2. Dextromethorphan 30 mg every 6-8 hrs (plain Robitussin) for cough suppression 3. Tylenol for pain up to 1000 mg three times daily.  4. Nasal saline-OTC nose spray for congestion.   F/u for chest pain, shortness of breath, persistent high fever.   Please establish with primary care in [place].  I know Drs. Adriana Simas and Catering manager at [facility] at [place], they are both excellent providers. I am just not sure if their practice accepts patients with medicaid.  Dr. Armen Pickup

## 2015-09-28 NOTE — Progress Notes (Signed)
F/U HTN No medication x 2 month  No pain today  No suicidal thought in the past two weeks

## 2015-09-28 NOTE — Assessment & Plan Note (Signed)
You have a viral URI with cough. For this please do the following:  1. Drink plenty of fluids. Hot tea, soup etc will help open your nasal passages. 2. Dextromethorphan 30 mg every 6-8 hrs (plain Robitussin) for cough suppression 3. Tylenol for pain up to 1000 mg three times daily.  4. Nasal saline-OTC nose spray for congestion.   F/u for chest pain, shortness of breath, persistent high fever.   amox prn symptoms < 10 days

## 2015-09-28 NOTE — Progress Notes (Signed)
Subjective:  Patient ID: Caitlin [name], female    DOB: 1966-01-18  Age: 50 y.o. MRN: [id number]  CC: Hypertension   HPI Cristen Bredeson Kelm presents for    1. CHRONIC HYPERTENSION  Disease Monitoring  Blood pressure range: not checking   Chest pain: no   Dyspnea: yes, with exercise    Claudication: no   Medication compliance: no  Medication Side Effects  Lightheadedness: no   Urinary frequency: no   Edema: no     2. URI: cough and congestion x 4 days. No fever or chills. Daughter sick at home. No CP or SOB.    Patient has moved to [facility] and ask for new PCP suggestions in [place].   Social History  Substance Use Topics  . Smoking status: Never Smoker   . Smokeless tobacco: Not on file  . Alcohol Use: Yes    Outpatient Prescriptions Prior to Visit  Medication Sig Dispense Refill  . lisinopril-hydrochlorothiazide (PRINZIDE,ZESTORETIC) 20-25 MG per tablet Take 1 tablet by mouth daily. 30 tablet 11  . pantoprazole (PROTONIX) 40 MG tablet Take 1 tablet (40 mg total) by mouth daily before supper. 90 tablet 2  . venlafaxine (EFFEXOR) 37.5 MG tablet Take 1 tablet (37.5 mg total) by mouth daily with supper. 30 tablet 5   No facility-administered medications prior to visit.    ROS Review of Systems  Constitutional: Negative for fever and chills.  HENT: Positive for congestion and rhinorrhea.   Eyes: Negative for visual disturbance.  Respiratory: Positive for cough. Negative for shortness of breath.   Cardiovascular: Negative for chest pain.  Gastrointestinal: Negative for abdominal pain and blood in stool.  Musculoskeletal: Negative for back pain and arthralgias.  Skin: Negative for rash.  Allergic/Immunologic: Negative for immunocompromised state.  Hematological: Negative for adenopathy. Does not bruise/bleed easily.  Psychiatric/Behavioral: Negative for suicidal ideas and dysphoric mood.    Objective:  BP 160/90 mmHg  Pulse 78  Temp(Src) 98.4 F  (36.9 C) (Oral)  Resp 16  Ht  (1.6 m)  Wt 176 lb (79.833 kg)  BMI 31.18 kg/m2  SpO2 99%  LMP 09/17/2015  BP/Weight 09/28/2015 01/25/2015 11/20/2014  Systolic BP 160 - 140  Diastolic BP 90 - 90  Wt. (Lbs) 176 180.1 178  BMI 31.18 30.9 31.54   Physical Exam  Constitutional: She is oriented to person, place, and time. She appears well-developed and well-nourished. No distress.  HENT:  Head: Normocephalic and atraumatic.  Nose: Mucosal edema and rhinorrhea present.  Cardiovascular: Normal rate, regular rhythm, normal heart sounds and intact distal pulses.   Pulmonary/Chest: Effort normal and breath sounds normal.  Musculoskeletal: She exhibits no edema.  Neurological: She is alert and oriented to person, place, and time.  Skin: Skin is warm and dry. No rash noted.  Psychiatric: She has a normal mood and affect.    Lab Results  Component Value Date   HGBA1C 5.10 09/28/2015    Assessment & Plan:  Shawntel was seen today for hypertension.  Diagnoses and all orders for this visit:  Healthcare maintenance -     POCT glycosylated hemoglobin (Hb A1C)  Essential hypertension -     COMPLETE METABOLIC PANEL WITH GFR -     lisinopril-hydrochlorothiazide (PRINZIDE,ZESTORETIC) 20-25 MG tablet; Take 1 tablet by mouth daily.  Viral URI with cough -     Discontinue: amoxicillin (AMOXIL) 500 MG capsule; Take 1 capsule (500 mg total) by mouth 3 (three) times daily. -     amoxicillin (AMOXIL)  500 MG capsule; Take 1 capsule (500 mg total) by mouth 3 (three) times daily.   Follow-up: No Follow-up on file.   Dessa Phi MD

## 2015-09-28 NOTE — Assessment & Plan Note (Signed)
A: elevated BP Med: non compliant P: Restart prinzide  CMP

## 2015-10-02 ENCOUNTER — Telehealth: Payer: Self-pay

## 2015-10-02 NOTE — Telephone Encounter (Signed)
Tried to contact patient this am  Patient not available Through sign language interpreter

## 2015-10-02 NOTE — Telephone Encounter (Signed)
-----   Message from Dessa Phi, MD sent at 10/01/2015  8:28 AM EST ----- Normal CMP

## 2015-10-02 NOTE — Telephone Encounter (Signed)
Patient returned phone call and is aware of her normal lab results 

## 2015-12-27 ENCOUNTER — Emergency Department
Admission: EM | Admit: 2015-12-27 | Discharge: 2015-12-28 | Disposition: A | Discharge status: Home / Self Care | Payer: Medicaid Other | Attending: Emergency Medicine | Admitting: Emergency Medicine

## 2015-12-27 ENCOUNTER — Emergency Department: Payer: Medicaid Other

## 2015-12-27 ENCOUNTER — Encounter: Payer: Self-pay | Admitting: Emergency Medicine

## 2015-12-27 DIAGNOSIS — Z79899 Other long term (current) drug therapy: Secondary | ICD-10-CM | POA: Diagnosis not present

## 2015-12-27 DIAGNOSIS — R0789 Other chest pain: Secondary | ICD-10-CM | POA: Diagnosis present

## 2015-12-27 DIAGNOSIS — K219 Gastro-esophageal reflux disease without esophagitis: Secondary | ICD-10-CM | POA: Insufficient documentation

## 2015-12-27 DIAGNOSIS — I10 Essential (primary) hypertension: Secondary | ICD-10-CM | POA: Insufficient documentation

## 2015-12-27 LAB — CBC
HEMATOCRIT: 34.6 % — AB (ref 35.0–47.0)
Hemoglobin: 11.4 g/dL — ABNORMAL LOW (ref 12.0–16.0)
MCH: 27.5 pg (ref 26.0–34.0)
MCHC: 33 g/dL (ref 32.0–36.0)
MCV: 83.5 fL (ref 80.0–100.0)
PLATELETS: 313 10*3/uL (ref 150–440)
RBC: 4.15 MIL/uL (ref 3.80–5.20)
RDW: 15.8 % — AB (ref 11.5–14.5)
WBC: 5.4 10*3/uL (ref 3.6–11.0)

## 2015-12-27 LAB — COMPREHENSIVE METABOLIC PANEL
ALT: 15 U/L (ref 14–54)
AST: 18 U/L (ref 15–41)
Albumin: 4.1 g/dL (ref 3.5–5.0)
Alkaline Phosphatase: 69 U/L (ref 38–126)
Anion gap: 3 — ABNORMAL LOW (ref 5–15)
BILIRUBIN TOTAL: 0.4 mg/dL (ref 0.3–1.2)
BUN: 18 mg/dL (ref 6–20)
CHLORIDE: 102 mmol/L (ref 101–111)
CO2: 33 mmol/L — ABNORMAL HIGH (ref 22–32)
Calcium: 9.3 mg/dL (ref 8.9–10.3)
Creatinine, Ser: 0.96 mg/dL (ref 0.44–1.00)
Glucose, Bld: 90 mg/dL (ref 65–99)
POTASSIUM: 3.5 mmol/L (ref 3.5–5.1)
Sodium: 138 mmol/L (ref 135–145)
TOTAL PROTEIN: 7.8 g/dL (ref 6.5–8.1)

## 2015-12-27 LAB — TROPONIN I

## 2015-12-27 NOTE — ED Notes (Signed)
Patient transported to X-ray 

## 2015-12-27 NOTE — Discharge Instructions (Signed)
Nonspecific Chest Pain  °Chest pain can be caused by many different conditions. There is always a chance that your pain could be related to something serious, such as a heart attack or a blood clot in your lungs. Chest pain can also be caused by conditions that are not life-threatening. If you have chest pain, it is very important to follow up with your health care provider. °CAUSES  °Chest pain can be caused by: °· Heartburn. °· Pneumonia or bronchitis. °· Anxiety or stress. °· Inflammation around your heart (pericarditis) or lung (pleuritis or pleurisy). °· A blood clot in your lung. °· A collapsed lung (pneumothorax). It can develop suddenly on its own (spontaneous pneumothorax) or from trauma to the chest. °· Shingles infection (varicella-zoster virus). °· Heart attack. °· Damage to the bones, muscles, and cartilage that make up your chest wall. This can include: °¨ Bruised bones due to injury. °¨ Strained muscles or cartilage due to frequent or repeated coughing or overwork. °¨ Fracture to one or more ribs. °¨ Sore cartilage due to inflammation (costochondritis). °RISK FACTORS  °Risk factors for chest pain may include: °· Activities that increase your risk for trauma or injury to your chest. °· Respiratory infections or conditions that cause frequent coughing. °· Medical conditions or overeating that can cause heartburn. °· Heart disease or family history of heart disease. °· Conditions or health behaviors that increase your risk of developing a blood clot. °· Having had chicken pox (varicella zoster). °SIGNS AND SYMPTOMS °Chest pain can feel like: °· Burning or tingling on the surface of your chest or deep in your chest. °· Crushing, pressure, aching, or squeezing pain. °· Dull or sharp pain that is worse when you move, cough, or take a deep breath. °· Pain that is also felt in your back, neck, shoulder, or arm, or pain that spreads to any of these areas. °Your chest pain may come and go, or it may stay  constant. °DIAGNOSIS °Lab tests or other studies may be needed to find the cause of your pain. Your health care provider may have you take a test called an ambulatory ECG (electrocardiogram). An ECG records your heartbeat patterns at the time the test is performed. You may also have other tests, such as: °· Transthoracic echocardiogram (TTE). During echocardiography, sound waves are used to create a picture of all of the heart structures and to look at how blood flows through your heart. °· Transesophageal echocardiogram (TEE). This is a more advanced imaging test that obtains images from inside your body. It allows your health care provider to see your heart in finer detail. °· Cardiac monitoring. This allows your health care provider to monitor your heart rate and rhythm in real time. °· Holter monitor. This is a portable device that records your heartbeat and can help to diagnose abnormal heartbeats. It allows your health care provider to track your heart activity for several days, if needed. °· Stress tests. These can be done through exercise or by taking medicine that makes your heart beat more quickly. °· Blood tests. °· Imaging tests. °TREATMENT  °Your treatment depends on what is causing your chest pain. Treatment may include: °· Medicines. These may include: °¨ Acid blockers for heartburn. °¨ Anti-inflammatory medicine. °¨ Pain medicine for inflammatory conditions. °¨ Antibiotic medicine, if an infection is present. °¨ Medicines to dissolve blood clots. °¨ Medicines to treat coronary artery disease. °· Supportive care for conditions that do not require medicines. This may include: °¨ Resting. °¨ Applying heat   or cold packs to injured areas. °¨ Limiting activities until pain decreases. °HOME CARE INSTRUCTIONS °· If you were prescribed an antibiotic medicine, finish it all even if you start to feel better. °· Avoid any activities that bring on chest pain. °· Do not use any tobacco products, including  cigarettes, chewing tobacco, or electronic cigarettes. If you need help quitting, ask your health care provider. °· Do not drink alcohol. °· Take medicines only as directed by your health care provider. °· Keep all follow-up visits as directed by your health care provider. This is important. This includes any further testing if your chest pain does not go away. °· If heartburn is the cause for your chest pain, you may be told to keep your head raised (elevated) while sleeping. This reduces the chance that acid will go from your stomach into your esophagus. °· Make lifestyle changes as directed by your health care provider. These may include: °¨ Getting regular exercise. Ask your health care provider to suggest some activities that are safe for you. °¨ Eating a heart-healthy diet. A registered dietitian can help you to learn healthy eating options. °¨ Maintaining a healthy weight. °¨ Managing diabetes, if necessary. °¨ Reducing stress. °SEEK MEDICAL CARE IF: °· Your chest pain does not go away after treatment. °· You have a rash with blisters on your chest. °· You have a fever. °SEEK IMMEDIATE MEDICAL CARE IF:  °· Your chest pain is worse. °· You have an increasing cough, or you cough up blood. °· You have severe abdominal pain. °· You have severe weakness. °· You faint. °· You have chills. °· You have sudden, unexplained chest discomfort. °· You have sudden, unexplained discomfort in your arms, back, neck, or jaw. °· You have shortness of breath at any time. °· You suddenly start to sweat, or your skin gets clammy. °· You feel nauseous or you vomit. °· You suddenly feel light-headed or dizzy. °· Your heart begins to beat quickly, or it feels like it is skipping beats. °These symptoms may represent a serious problem that is an emergency. Do not wait to see if the symptoms will go away. Get medical help right away. Call your local emergency services (911 in the [place] Do not drive yourself to the hospital. °  °This  information is not intended to replace advice given to you by your health care provider. Make sure you discuss any questions you have with your health care provider. °  °Document Released: 05/14/2005 Document Revised: 08/25/2014 Document Reviewed: 03/10/2014 °Elsevier Interactive Patient Education ©2016 [employer] °Heartburn °Heartburn is a type of pain or discomfort that can happen in the throat or chest. It is often described as a burning pain. It may also cause a bad taste in the mouth. Heartburn may feel worse when you lie down or bend over, and it is often worse at night. Heartburn may be caused by stomach contents that move back up into the esophagus (reflux). °HOME CARE INSTRUCTIONS °Take these actions to decrease your discomfort and to help avoid complications. °Diet °· Follow a diet as recommended by your health care provider. This may involve avoiding foods and drinks such as: °¨ Coffee and tea (with or without caffeine). °¨ Drinks that contain alcohol. °¨ Energy drinks and sports drinks. °¨ Carbonated drinks or sodas. °¨ Chocolate and cocoa. °¨ Peppermint and mint flavorings. °¨ Garlic and onions. °¨ Horseradish. °¨ Spicy and acidic foods, including peppers, chili powder, curry powder, vinegar, hot sauces, and barbecue sauce. °¨   Citrus fruit juices and citrus fruits, such as oranges, lemons, and limes. °¨ Tomato-based foods, such as red sauce, chili, salsa, and pizza with red sauce. °¨ Fried and fatty foods, such as donuts, french fries, potato chips, and high-fat dressings. °¨ High-fat meats, such as hot dogs and fatty cuts of red and white meats, such as rib eye steak, sausage, ham, and bacon. °¨ High-fat dairy items, such as whole milk, butter, and cream cheese. °· Eat small, frequent meals instead of large meals. °· Avoid drinking large amounts of liquid with your meals. °· Avoid eating meals during the 2-3 hours before bedtime. °· Avoid lying down right after you eat. °· Do not exercise right  after you eat. ° General Instructions  °· Pay attention to any changes in your symptoms. °· Take over-the-counter and prescription medicines only as told by your health care provider. Do not take aspirin, ibuprofen, or other NSAIDs unless your health care provider told you to do so. °· Do not use any tobacco products, including cigarettes, chewing tobacco, and e-cigarettes. If you need help quitting, ask your health care provider. °· Wear loose-fitting clothing. Do not wear anything tight around your waist that causes pressure on your abdomen. °· Raise (elevate) the head of your bed about 6 inches (15 cm). °· Try to reduce your stress, such as with yoga or meditation. If you need help reducing stress, ask your health care provider. °· If you are overweight, reduce your weight to an amount that is healthy for you. Ask your health care provider for guidance about a safe weight loss goal. °· Keep all follow-up visits as told by your health care provider. This is important. °SEEK MEDICAL CARE IF: °· You have new symptoms. °· You have unexplained weight loss. °· You have difficulty swallowing, or it hurts to swallow. °· You have wheezing or a persistent cough. °· Your symptoms do not improve with treatment. °· You have frequent heartburn for more than two weeks. °SEEK IMMEDIATE MEDICAL CARE IF: °· You have pain in your arms, neck, jaw, teeth, or back. °· You feel sweaty, dizzy, or light-headed. °· You have chest pain or shortness of breath. °· You vomit and your vomit looks like blood or coffee grounds. °· Your stool is bloody or black. °  °This information is not intended to replace advice given to you by your health care provider. Make sure you discuss any questions you have with your health care provider. °  °Document Released: 12/21/2008 Document Revised: 04/25/2015 Document Reviewed: 11/29/2014 °Elsevier Interactive Patient Education ©2016 Elsevier Inc. ° °

## 2015-12-27 NOTE — ED Notes (Signed)
Pt in via triage w/ complaints of chest pain radiating up into neck x 2 days.  Pt worried that something is wrong with her heart and wanted to get checked out.  Pt reports hx of hypertension but states she did take her lisinopril this morning.  Pt hypertensive at this time, other vitals WDL.  Pt hearing impaired; use of mobile interpreter to communicate with pt.

## 2015-12-27 NOTE — ED Notes (Signed)
Pt is hearing impaired.  Per note written to registration pt is having chest pain and states she has HTN.

## 2015-12-27 NOTE — ED Provider Notes (Signed)
[facility] Emergency Department Provider Note  ____________________________________________  Time seen: 11:35 PM  I have reviewed the triage vital signs and the nursing notes.   HISTORY  Chief Complaint Chest Pain History and physical obtained with assistance from ASL video interpreter   HPI Caitlin KannerDana Yvette [name] is a 50 y.o. female who complains of chest pain that started about 6:30 PM tonight. She reports that she had been out and about shopping all afternoon, and felt like she had been very active and may be overdone it. She ate dinner and then came home, and when she sat down and started resting she developed a pain which she describes as being in her upper chest and throat. There is a burning type pain. Was intermittent lasting only one or 2 minutes at a time, nonradiating. Not exertional, mainly onset during rest. No aggravating or alleviating factors. Moderate intensity, no vomiting. She forgets to take her antihypertensive today. She also takes Protonix for acid reflux which she has not taken recently. This does feel like acid reflux to her.     Past Medical History  Diagnosis Date  . Deaf   . NO (nasal obstruction)   . Hypertension 2011     Patient Active Problem List   Diagnosis Date Noted  . Viral URI with cough 09/28/2015  . Obesity (BMI 30.0-34.9) 11/20/2014  . Menopausal symptoms 07/27/2014  . Hair loss 05/01/2014  . Onychomycosis 05/01/2014  . HTN (hypertension) 03/06/2012  . GERD (gastroesophageal reflux disease) 03/06/2012     Past Surgical History  Procedure Laterality Date  . Cholecystectomy    . Cesarean section    . Tubal ligation       Current Outpatient Rx  Name  Route  Sig  Dispense  Refill  . lisinopril-hydrochlorothiazide (PRINZIDE,ZESTORETIC) 20-25 MG tablet   Oral   Take 1 tablet by mouth daily.   30 tablet   11   . pantoprazole (PROTONIX) 40 MG tablet   Oral   Take 1 tablet (40 mg total) by mouth daily  before supper.   90 tablet   2      Allergies Eggs or egg-derived products and Milk-related compounds   Family History  Problem Relation Age of Onset  . Heart disease Mother   . Stroke Mother     Social History Social History  Substance Use Topics  . Smoking status: Never Smoker   . Smokeless tobacco: None  . Alcohol Use: Yes    Review of Systems  Constitutional:   No fever or chills.  Eyes:   No vision changes.  ENT:   Positive sore throat. No rhinorrhea. Cardiovascular:   Positive as above chest pain. Respiratory:   No dyspnea or cough. Gastrointestinal:   Negative for abdominal pain, vomiting and diarrhea.  Genitourinary:   Negative for dysuria or difficulty urinating. Musculoskeletal:   Negative for focal pain or swelling Neurological:   Negative for headaches 10-point ROS otherwise negative.  ____________________________________________   PHYSICAL EXAM:  VITAL SIGNS: ED Triage Vitals  Enc Vitals Group     BP 12/27/15 2209 185/98 mmHg     Pulse Rate 12/27/15 2209 86     Resp 12/27/15 2209 18     Temp 12/27/15 2209 98.4 F (36.9 C)     Temp src --      SpO2 12/27/15 2209 99 %     Weight 12/27/15 2209 180 lb (81.647 kg)     Height 12/27/15 2209 5\' 6"  (1.676 m)  Head Cir --      Peak Flow --      Pain Score --      Pain Loc --      Pain Edu? --      Excl. in GC? --     Vital signs reviewed, nursing assessments reviewed.   Constitutional:   Alert and oriented. Well appearing and in no distress. Eyes:   No scleral icterus. No conjunctival pallor. PERRL. EOMI.  No nystagmus. ENT   Head:   Normocephalic and atraumatic.   Nose:   No congestion/rhinnorhea. No septal hematoma   Mouth/Throat:   MMM, no pharyngeal erythema. No peritonsillar mass.    Neck:   No stridor. No SubQ emphysema. No meningismus. Hematological/Lymphatic/Immunilogical:   No cervical lymphadenopathy. Cardiovascular:   RRR. Symmetric bilateral radial and DP pulses.   No murmurs.  Respiratory:   Normal respiratory effort without tachypnea nor retractions. Breath sounds are clear and equal bilaterally. No wheezes/rales/rhonchi. Gastrointestinal:   Soft and nontender. Non distended. There is no CVA tenderness.  No rebound, rigidity, or guarding. Genitourinary:   deferred Musculoskeletal:   Nontender with normal range of motion in all extremities. No joint effusions.  No lower extremity tenderness.  No edema. Neurologic:   Normal speech and language.  CN 2-10 normal. Motor grossly intact. No gross focal neurologic deficits are appreciated.  Skin:    Skin is warm, dry and intact. No rash noted.  No petechiae, purpura, or bullae.  ____________________________________________    LABS (pertinent positives/negatives) (all labs ordered are listed, but only abnormal results are displayed) Labs Reviewed  CBC - Abnormal; Notable for the following:    Hemoglobin 11.4 (*)    HCT 34.6 (*)    RDW 15.8 (*)    All other components within normal limits  COMPREHENSIVE METABOLIC PANEL - Abnormal; Notable for the following:    CO2 33 (*)    Anion gap 3 (*)    All other components within normal limits  TROPONIN I   ____________________________________________   EKG  Interpreted by me Normal sinus rhythm rate of 84, normal axis intervals QRS ST segments and T waves.  ____________________________________________    RADIOLOGY  Chest x-ray unremarkable  ____________________________________________   PROCEDURES   ____________________________________________   INITIAL IMPRESSION / ASSESSMENT AND PLAN / ED COURSE  Pertinent labs & imaging results that were available during my care of the patient were reviewed by me and considered in my medical decision making (see chart for details).  Patient presents with atypical chest pain.Considering the patient's symptoms, medical history, and physical examination today, I have low suspicion for ACS, PE, TAD,  pneumothorax, carditis, mediastinitis, pneumonia, CHF, or sepsis.  High suspicion for acid reflux. EKG labs chest x-ray all unremarkable. Patient states she like to try taking her an acid medicine and see if that helps, and then she will follow closely with her primary care doctor as well. Heart score is low risk, suitable for discharge and outpatient follow-up.     ____________________________________________   FINAL CLINICAL IMPRESSION(S) / ED DIAGNOSES  Final diagnoses:  Atypical chest pain  Gastroesophageal reflux disease, esophagitis presence not specified       Portions of this note were generated with dragon dictation software. Dictation errors may occur despite best attempts at proofreading.   Sharman Cheek, MD 12/27/15 [phone]

## 2016-01-10 ENCOUNTER — Encounter: Payer: Self-pay | Admitting: Emergency Medicine

## 2016-01-10 ENCOUNTER — Emergency Department
Admission: EM | Admit: 2016-01-10 | Discharge: 2016-01-10 | Disposition: A | Discharge status: Home / Self Care | Payer: Medicaid Other | Attending: Emergency Medicine | Admitting: Emergency Medicine

## 2016-01-10 ENCOUNTER — Emergency Department: Payer: Medicaid Other

## 2016-01-10 DIAGNOSIS — G44209 Tension-type headache, unspecified, not intractable: Secondary | ICD-10-CM | POA: Diagnosis not present

## 2016-01-10 DIAGNOSIS — I10 Essential (primary) hypertension: Secondary | ICD-10-CM | POA: Diagnosis not present

## 2016-01-10 DIAGNOSIS — Z91011 Allergy to milk products: Secondary | ICD-10-CM | POA: Insufficient documentation

## 2016-01-10 DIAGNOSIS — Z79899 Other long term (current) drug therapy: Secondary | ICD-10-CM | POA: Diagnosis not present

## 2016-01-10 DIAGNOSIS — Z91012 Allergy to eggs: Secondary | ICD-10-CM | POA: Insufficient documentation

## 2016-01-10 LAB — COMPREHENSIVE METABOLIC PANEL
ALK PHOS: 86 U/L (ref 38–126)
ALT: 64 U/L — AB (ref 14–54)
AST: 53 U/L — ABNORMAL HIGH (ref 15–41)
Albumin: 3.9 g/dL (ref 3.5–5.0)
Anion gap: 7 (ref 5–15)
BILIRUBIN TOTAL: 0.5 mg/dL (ref 0.3–1.2)
BUN: 11 mg/dL (ref 6–20)
CALCIUM: 8.8 mg/dL — AB (ref 8.9–10.3)
CO2: 26 mmol/L (ref 22–32)
CREATININE: 0.88 mg/dL (ref 0.44–1.00)
Chloride: 105 mmol/L (ref 101–111)
GFR calc non Af Amer: >60 mL/min (ref >60)
Glucose, Bld: 104 mg/dL — ABNORMAL HIGH (ref 65–99)
Potassium: 3.7 mmol/L (ref 3.5–5.1)
Sodium: 138 mmol/L (ref 135–145)
TOTAL PROTEIN: 7.7 g/dL (ref 6.5–8.1)

## 2016-01-10 LAB — CBC WITH DIFFERENTIAL/PLATELET
BAND NEUTROPHILS: 0 %
BASOS PCT: 1 %
BLASTS: 0 %
Basophils Absolute: 0 10*3/uL (ref 0–0.1)
EOS ABS: 0 10*3/uL (ref 0–0.7)
Eosinophils Relative: 0 %
HEMATOCRIT: 33.2 % — AB (ref 35.0–47.0)
HEMOGLOBIN: 11.2 g/dL — AB (ref 12.0–16.0)
Lymphocytes Relative: 68 %
Lymphs Abs: 3.2 10*3/uL (ref 1.0–3.6)
MCH: 28.3 pg (ref 26.0–34.0)
MCHC: 33.7 g/dL (ref 32.0–36.0)
MCV: 83.9 fL (ref 80.0–100.0)
MONO ABS: 0.3 10*3/uL (ref 0.2–0.9)
MYELOCYTES: 0 %
Metamyelocytes Relative: 0 %
Monocytes Relative: 7 %
NEUTROS PCT: 24 %
NRBC: 0 /100{WBCs}
Neutro Abs: 1.1 10*3/uL — ABNORMAL LOW (ref 1.4–6.5)
Other: 0 %
PROMYELOCYTES ABS: 0 %
Platelets: 250 10*3/uL (ref 150–440)
RBC: 3.95 MIL/uL (ref 3.80–5.20)
RDW: 15.4 % — ABNORMAL HIGH (ref 11.5–14.5)
WBC: 4.6 10*3/uL (ref 3.6–11.0)

## 2016-01-10 LAB — C-REACTIVE PROTEIN: CRP: 3.3 mg/dL — AB (ref <1.0)

## 2016-01-10 LAB — SEDIMENTATION RATE: Sed Rate: 49 mm/hr — ABNORMAL HIGH (ref 0–20)

## 2016-01-10 MED ORDER — KETOROLAC TROMETHAMINE 30 MG/ML IJ SOLN
30.0000 mg | Freq: Once | INTRAMUSCULAR | Status: AC
  Administered 2016-01-10: 30 mg via INTRAVENOUS
  Filled 2016-01-10: qty 1

## 2016-01-10 NOTE — ED Notes (Signed)
Pt to ed with c/o headache x 2 weeks.

## 2016-01-10 NOTE — Discharge Instructions (Signed)
General Headache Without Cause A headache is pain or discomfort felt around the head or neck area. The specific cause of a headache may not be found. There are many causes and types of headaches. A few common ones are:  Tension headaches.  Migraine headaches.  Cluster headaches.  Chronic daily headaches. HOME CARE INSTRUCTIONS  Watch your condition for any changes. Take these steps to help with your condition: Managing Pain  Take over-the-counter and prescription medicines only as told by your health care provider.  Lie down in a dark, quiet room when you have a headache.  If directed, apply ice to the head and neck area:  Put ice in a plastic bag.  Place a towel between your skin and the bag.  Leave the ice on for 20 minutes, 2-3 times per day.  Use a heating pad or hot shower to apply heat to the head and neck area as told by your health care provider.  Keep lights dim if bright lights bother you or make your headaches worse. Eating and Drinking  Eat meals on a regular schedule.  Limit alcohol use.  Decrease the amount of caffeine you drink, or stop drinking caffeine. General Instructions  Keep all follow-up visits as told by your health care provider. This is important.  Keep a headache journal to help find out what may trigger your headaches. For example, write down:  What you eat and drink.  How much sleep you get.  Any change to your diet or medicines.  Try massage or other relaxation techniques.  Limit stress.  Sit up straight, and do not tense your muscles.  Do not use tobacco products, including cigarettes, chewing tobacco, or e-cigarettes. If you need help quitting, ask your health care provider.  Exercise regularly as told by your health care provider.  Sleep on a regular schedule. Get 7-9 hours of sleep, or the amount recommended by your health care provider. SEEK MEDICAL CARE IF:   Your symptoms are not helped by medicine.  You have a  headache that is different from the usual headache.  You have nausea or you vomit.  You have a fever. SEEK IMMEDIATE MEDICAL CARE IF:   Your headache becomes severe.  You have repeated vomiting.  You have a stiff neck.  You have a loss of vision.  You have problems with speech.  You have pain in the eye or ear.  You have muscular weakness or loss of muscle control.  You lose your balance or have trouble walking.  You feel faint or pass out.  You have confusion.   This information is not intended to replace advice given to you by your health care provider. Make sure you discuss any questions you have with your health care provider.   Document Released: 08/04/2005 Document Revised: 04/25/2015 Document Reviewed: 11/27/2014 Elsevier Interactive Patient Education [employer]  Please follow-up with your regular doctor sometime later this week before the end of the week. Please have him rechecked the sedimentation rate and checked a CRP lab test that I ordered here in the ER that will not be back for 2-3 days. Please return for worse headache vomiting fever or any other problems. General Headache Without Cause A headache is pain or discomfort felt around the head or neck area. The specific cause of a headache may not be found. There are many causes and types of headaches. A few common ones are:  Tension headaches.  Migraine headaches.  Cluster headaches.  Chronic daily  headaches. HOME CARE INSTRUCTIONS  Watch your condition for any changes. Take these steps to help with your condition: Managing Pain  Take over-the-counter and prescription medicines only as told by your health care provider.  Lie down in a dark, quiet room when you have a headache.  If directed, apply ice to the head and neck area:  Put ice in a plastic bag.  Place a towel between your skin and the bag.  Leave the ice on for 20 minutes, 2-3 times per day.  Use a heating pad or hot shower to  apply heat to the head and neck area as told by your health care provider.  Keep lights dim if bright lights bother you or make your headaches worse. Eating and Drinking  Eat meals on a regular schedule.  Limit alcohol use.  Decrease the amount of caffeine you drink, or stop drinking caffeine. General Instructions  Keep all follow-up visits as told by your health care provider. This is important.  Keep a headache journal to help find out what may trigger your headaches. For example, write down:  What you eat and drink.  How much sleep you get.  Any change to your diet or medicines.  Try massage or other relaxation techniques.  Limit stress.  Sit up straight, and do not tense your muscles.  Do not use tobacco products, including cigarettes, chewing tobacco, or e-cigarettes. If you need help quitting, ask your health care provider.  Exercise regularly as told by your health care provider.  Sleep on a regular schedule. Get 7-9 hours of sleep, or the amount recommended by your health care provider. SEEK MEDICAL CARE IF:   Your symptoms are not helped by medicine.  You have a headache that is different from the usual headache.  You have nausea or you vomit.  You have a fever. SEEK IMMEDIATE MEDICAL CARE IF:   Your headache becomes severe.  You have repeated vomiting.  You have a stiff neck.  You have a loss of vision.  You have problems with speech.  You have pain in the eye or ear.  You have muscular weakness or loss of muscle control.  You lose your balance or have trouble walking.  You feel faint or pass out.  You have confusion.   This information is not intended to replace advice given to you by your health care provider. Make sure you discuss any questions you have with your health care provider.   Document Released: 08/04/2005 Document Revised: 04/25/2015 Document Reviewed: 11/27/2014 Elsevier Interactive Patient Education [employer]

## 2016-01-10 NOTE — ED Notes (Signed)
Using ASL via interpreter [id number] Caitlin [name], pt reports headache x 2 weeks, pain is worse to left temple and top of head. Pt denies cough/nasal drainage. Pt has no weakness to extremities. Pt ambulatory without difficulties. Pt oriented x 4.

## 2016-01-10 NOTE — ED Provider Notes (Signed)
[facility] Emergency Department Provider Note   ____________________________________________  Time seen: Approximately 10:45 AM  I have reviewed the triage vital signs and the nursing notes.   HISTORY  Chief Complaint Headache  History obtained via sign language interpreter HPI Caitlin KannerDana Yvette [name] is a 50 y.o. female who complains of 3 weeks of worsening headache. Started about 3 weeks ago very mild doctor gave her some medicine and one away came back about a week later is now in the left temple and in the left occiput doesn't seem to be radiating into the neck however. His worse with movement or palpation. Today patient reports her boyfriend felt that she was hot. She also feels herself that she might be running a fever. She had a cough 3 days ago but none now. She has no postnasal drip. No nausea no vomiting no other symptoms.  Past Medical History  Diagnosis Date  . Deaf   . NO (nasal obstruction)   . Hypertension 2011    Patient Active Problem List   Diagnosis Date Noted  . Viral URI with cough 09/28/2015  . Obesity (BMI 30.0-34.9) 11/20/2014  . Menopausal symptoms 07/27/2014  . Hair loss 05/01/2014  . Onychomycosis 05/01/2014  . HTN (hypertension) 03/06/2012  . GERD (gastroesophageal reflux disease) 03/06/2012    Past Surgical History  Procedure Laterality Date  . Cholecystectomy    . Cesarean section    . Tubal ligation      Current Outpatient Rx  Name  Route  Sig  Dispense  Refill  . lisinopril-hydrochlorothiazide (PRINZIDE,ZESTORETIC) 20-25 MG tablet   Oral   Take 1 tablet by mouth daily.   30 tablet   11   . pantoprazole (PROTONIX) 40 MG tablet   Oral   Take 1 tablet (40 mg total) by mouth daily before supper.   90 tablet   2     Allergies Eggs or egg-derived products and Milk-related compounds  Family History  Problem Relation Age of Onset  . Heart disease Mother   . Stroke Mother     Social History Social  History  Substance Use Topics  . Smoking status: Never Smoker   . Smokeless tobacco: None  . Alcohol Use: Yes    Review of Systems Constitutional: Possible fever Eyes: No visual changes. ENT: No sore throat. Cardiovascular: Denies chest pain. Respiratory: Denies shortness of breath. Gastrointestinal: No abdominal pain.  No nausea, no vomiting.  No diarrhea.  No constipation. Genitourinary: Negative for dysuria. Musculoskeletal: Negative for back pain. Skin: Negative for rash. Neurological: Negative for focal weakness or numbness.  10-point ROS otherwise negative.  ____________________________________________   PHYSICAL EXAM:  VITAL SIGNS: ED Triage Vitals  Enc Vitals Group     BP 01/10/16 1006 154/86 mmHg     Pulse Rate 01/10/16 1006 88     Resp 01/10/16 1006 20     Temp 01/10/16 1006 98.8 F (37.1 C)     Temp Source 01/10/16 1006 Oral     SpO2 01/10/16 1006 97 %     Weight 01/10/16 1006 180 lb (81.647 kg)     Height --      Head Cir --      Peak Flow --      Pain Score 01/10/16 1006 4     Pain Loc --      Pain Edu? --      Excl. in GC? --     Constitutional: Alert and oriented. Well appearing and in no  acute distress. Eyes: Conjunctivae are normal. PERRL. EOMI.Fundi appear normal Head: Atraumatic. There is tenderness on palpation left temple and left occiput slightly tender also behind the left ear. Ears: TMs are clear bilaterally there is no tenderness on traction of the external ear Nose: No congestion/rhinnorhea. Mouth/Throat: Mucous membranes are moist.  Oropharynx non-erythematous. Neck: No stridor no cervical adenopathy   Cardiovascular: Normal rate, regular rhythm. Grossly normal heart sounds.  Good peripheral circulation. Respiratory: Normal respiratory effort.  No retractions. Lungs CTAB. Gastrointestinal: Soft and nontender. No distention. No abdominal bruits. No CVA tenderness. Musculoskeletal: No lower extremity tenderness nor edema.  No joint  effusions. Neurologic: Cranial nerves II through XII appear to be intact except for of course her deafness. Cerebellar finger to nose and rapid alternating movements in the hands are normal motor strength is 5 over 5 throughout sensation appears to be intact as well. Skin:  Skin is warm, dry and intact. No rash noted. Psychiatric: Mood and affect are normal. Speech and behavior are normal.  ____________________________________________   LABS (all labs ordered are listed, but only abnormal results are displayed)  Labs Reviewed  COMPREHENSIVE METABOLIC PANEL - Abnormal; Notable for the following:    Glucose, Bld 104 (*)    Calcium 8.8 (*)    AST 53 (*)    ALT 64 (*)    All other components within normal limits  CBC WITH DIFFERENTIAL/PLATELET - Abnormal; Notable for the following:    Hemoglobin 11.2 (*)    HCT 33.2 (*)    RDW 15.4 (*)    Neutro Abs 1.1 (*)    All other components within normal limits  SEDIMENTATION RATE - Abnormal; Notable for the following:    Sed Rate 49 (*)    All other components within normal limits  C-REACTIVE PROTEIN   ____________________________________________  EKG   ____________________________________________  RADIOLOGY   ____________________________________________   PROCEDURES   ____________________________________________   INITIAL IMPRESSION / ASSESSMENT AND PLAN / ED COURSE  Pertinent labs & imaging results that were available during my care of the patient were reviewed by me and considered in my medical decision making (see chart for details).   ____________________________________________   FINAL CLINICAL IMPRESSION(S) / ED DIAGNOSES  Final diagnoses:  Tension-type headache, not intractable, unspecified chronicity pattern      NEW MEDICATIONS STARTED DURING THIS VISIT:  Discharge Medication List as of 01/10/2016 12:52 PM       Note:  This document was prepared using Dragon voice recognition software and may include  unintentional dictation errors.    Arnaldo Natal, MD 01/10/16 [phone]

## 2016-06-10 ENCOUNTER — Emergency Department
Admission: EM | Admit: 2016-06-10 | Discharge: 2016-06-10 | Disposition: A | Discharge status: Home / Self Care | Payer: Medicaid Other | Attending: Emergency Medicine | Admitting: Emergency Medicine

## 2016-06-10 ENCOUNTER — Encounter: Payer: Self-pay | Admitting: Emergency Medicine

## 2016-06-10 ENCOUNTER — Emergency Department: Payer: Medicaid Other

## 2016-06-10 DIAGNOSIS — K859 Acute pancreatitis without necrosis or infection, unspecified: Secondary | ICD-10-CM | POA: Diagnosis not present

## 2016-06-10 DIAGNOSIS — R1013 Epigastric pain: Secondary | ICD-10-CM

## 2016-06-10 DIAGNOSIS — K529 Noninfective gastroenteritis and colitis, unspecified: Secondary | ICD-10-CM | POA: Diagnosis not present

## 2016-06-10 DIAGNOSIS — I10 Essential (primary) hypertension: Secondary | ICD-10-CM | POA: Insufficient documentation

## 2016-06-10 DIAGNOSIS — Z79899 Other long term (current) drug therapy: Secondary | ICD-10-CM | POA: Diagnosis not present

## 2016-06-10 DIAGNOSIS — R1084 Generalized abdominal pain: Secondary | ICD-10-CM

## 2016-06-10 LAB — URINALYSIS COMPLETE WITH MICROSCOPIC (ARMC ONLY)
BILIRUBIN URINE: NEGATIVE
Bacteria, UA: NONE SEEN
GLUCOSE, UA: NEGATIVE mg/dL
KETONES UR: NEGATIVE mg/dL
Leukocytes, UA: NEGATIVE
NITRITE: NEGATIVE
Protein, ur: NEGATIVE mg/dL
Specific Gravity, Urine: 1.016 (ref 1.005–1.030)
pH: 5 (ref 5.0–8.0)

## 2016-06-10 LAB — COMPREHENSIVE METABOLIC PANEL
ALK PHOS: 61 U/L (ref 38–126)
ALT: 56 U/L — AB (ref 14–54)
ANION GAP: 8 (ref 5–15)
AST: 39 U/L (ref 15–41)
Albumin: 4.1 g/dL (ref 3.5–5.0)
BILIRUBIN TOTAL: 0.7 mg/dL (ref 0.3–1.2)
BUN: 14 mg/dL (ref 6–20)
CALCIUM: 9.3 mg/dL (ref 8.9–10.3)
CO2: 28 mmol/L (ref 22–32)
CREATININE: 0.87 mg/dL (ref 0.44–1.00)
Chloride: 101 mmol/L (ref 101–111)
Glucose, Bld: 118 mg/dL — ABNORMAL HIGH (ref 65–99)
Potassium: 3.6 mmol/L (ref 3.5–5.1)
SODIUM: 137 mmol/L (ref 135–145)
TOTAL PROTEIN: 8.5 g/dL — AB (ref 6.5–8.1)

## 2016-06-10 LAB — CBC
HCT: 38.5 % (ref 35.0–47.0)
HEMOGLOBIN: 13.2 g/dL (ref 12.0–16.0)
MCH: 30.1 pg (ref 26.0–34.0)
MCHC: 34.3 g/dL (ref 32.0–36.0)
MCV: 87.7 fL (ref 80.0–100.0)
PLATELETS: 398 10*3/uL (ref 150–440)
RBC: 4.39 MIL/uL (ref 3.80–5.20)
RDW: 13.7 % (ref 11.5–14.5)
WBC: 10.6 10*3/uL (ref 3.6–11.0)

## 2016-06-10 LAB — POCT PREGNANCY, URINE: Preg Test, Ur: NEGATIVE

## 2016-06-10 LAB — LIPASE, BLOOD: Lipase: 60 U/L — ABNORMAL HIGH (ref 11–51)

## 2016-06-10 MED ORDER — SODIUM CHLORIDE 0.9 % IV BOLUS (SEPSIS)
1000.0000 mL | Freq: Once | INTRAVENOUS | Status: AC
  Administered 2016-06-10: 1000 mL via INTRAVENOUS

## 2016-06-10 MED ORDER — ONDANSETRON HCL 4 MG/2ML IJ SOLN
4.0000 mg | Freq: Once | INTRAMUSCULAR | Status: AC
  Administered 2016-06-10: 4 mg via INTRAVENOUS
  Filled 2016-06-10: qty 2

## 2016-06-10 MED ORDER — HYDROCODONE-ACETAMINOPHEN 5-325 MG PO TABS: 1.0000 | ORAL_TABLET | ORAL | 0 refills | Status: DC | PRN

## 2016-06-10 MED ORDER — ONDANSETRON 4 MG PO TBDP: 4.0000 mg | ORAL_TABLET | Freq: Three times a day (TID) | ORAL | 0 refills | Status: DC | PRN

## 2016-06-10 MED ORDER — IOPAMIDOL (ISOVUE-300) INJECTION 61%
30.0000 mL | Freq: Once | INTRAVENOUS | Status: AC | PRN
  Administered 2016-06-10: 30 mL via ORAL

## 2016-06-10 MED ORDER — MORPHINE SULFATE (PF) 2 MG/ML IV SOLN
2.0000 mg | Freq: Once | INTRAVENOUS | Status: AC
  Administered 2016-06-10: 2 mg via INTRAVENOUS
  Filled 2016-06-10: qty 1

## 2016-06-10 MED ORDER — IOPAMIDOL (ISOVUE-300) INJECTION 61%
100.0000 mL | Freq: Once | INTRAVENOUS | Status: AC | PRN
  Administered 2016-06-10: 100 mL via INTRAVENOUS

## 2016-06-10 NOTE — Discharge Instructions (Signed)
As we discussed please use her pain and nausea medication as needed. Return to the emergency department for any worsening pain, fever, or fever unable to keep down fluids due to vomiting. Please stick with a clear liquid diet for the first 2 days, then a very low fat diet for the next 1 week.

## 2016-06-10 NOTE — ED Provider Notes (Signed)
[facility] Emergency Department Provider Note   ____________________________________________   First MD Initiated Contact with [name] 06/10/16 0422     (approximate)  I have reviewed the triage vital signs and the nursing notes.   HISTORY  Chief Complaint Abdominal Pain  History obtained via sign language interpreter  HPI Caitlin [name] is a 50 y.o. female who presents to the ED from home with a chief complain of abdominal pain. [name] reports mid to upper abdominal cramping for the past week. Symptoms associated with nausea only. Eating tends to make her pain worse. She spoke with her PCP because she recently started a new birth control and he attributed the pain to her birth control. Denies fever, chills, chest pain, shortness of breath, vomiting, diarrhea, dysuria. Denies recent travel or trauma. Nothing makes her pain better.   Past Medical History:  Diagnosis Date  . Deaf   . Hypertension 2011  . NO (nasal obstruction)     [name] Active Problem List   Diagnosis Date Noted  . Viral URI with cough 09/28/2015  . Obesity (BMI 30.0-34.9) 11/20/2014  . Menopausal symptoms 07/27/2014  . Hair loss 05/01/2014  . Onychomycosis 05/01/2014  . HTN (hypertension) 03/06/2012  . GERD (gastroesophageal reflux disease) 03/06/2012    Past Surgical History:  Procedure Laterality Date  . CESAREAN SECTION    . CHOLECYSTECTOMY    . TUBAL LIGATION      Prior to Admission medications   Medication Sig Start Date End Date Taking? Authorizing Provider  lisinopril-hydrochlorothiazide (PRINZIDE,ZESTORETIC) 20-25 MG tablet Take 1 tablet by mouth daily. 09/28/15   Josalyn Funches, MD  pantoprazole (PROTONIX) 40 MG tablet Take 1 tablet (40 mg total) by mouth daily before supper. 07/27/14   Dessa Phi, MD    Allergies Eggs or egg-derived products and Milk-related compounds  Family History  Problem Relation Age of Onset  . Heart disease Mother   .  Stroke Mother     Social History Social History  Substance Use Topics  . Smoking status: Never Smoker  . Smokeless tobacco: Not on file  . Alcohol use Yes    Review of Systems  Constitutional: No fever/chills. Eyes: No visual changes. ENT: No sore throat. Cardiovascular: Denies chest pain. Respiratory: Denies shortness of breath. Gastrointestinal: Positive for abdominal pain.  Positive for nausea, no vomiting.  No diarrhea.  No constipation. Genitourinary: Negative for dysuria. Musculoskeletal: Negative for back pain. Skin: Negative for rash. Neurological: Negative for headaches, focal weakness or numbness.  10-point ROS otherwise negative.  ____________________________________________   PHYSICAL EXAM:  VITAL SIGNS: ED Triage Vitals  Enc Vitals Group     BP 06/10/16 0300 (!) 182/102     Pulse Rate 06/10/16 0300 (!) 105     Resp 06/10/16 0300 18     Temp 06/10/16 0300 98.2 F (36.8 C)     Temp Source 06/10/16 0300 Oral     SpO2 06/10/16 0300 98 %     Weight 06/10/16 0303 180 lb (81.6 kg)     Height 06/10/16 0303 5\' 3"  (1.6 m)     Head Circumference --      Peak Flow --      Pain Score 06/10/16 0303 8     Pain Loc --      Pain Edu? --      Excl. in GC? --     Constitutional: Alert and oriented. Well appearing and in mild acute distress. Eyes: Conjunctivae are normal. PERRL. EOMI. Head: Atraumatic.  Nose: No congestion/rhinnorhea. Mouth/Throat: Mucous membranes are moist.  Oropharynx non-erythematous. Neck: No stridor.   Cardiovascular: Normal rate, regular rhythm. Grossly normal heart sounds.  Good peripheral circulation. Respiratory: Normal respiratory effort.  No retractions. Lungs CTAB. Gastrointestinal: Soft and mildly tender to palpation umbilical and upper abdomen without rebound or guarding. No distention. No abdominal bruits. No CVA tenderness. Musculoskeletal: No lower extremity tenderness nor edema.  No joint effusions. Neurologic:  Normal speech  and language. No gross focal neurologic deficits are appreciated. No gait instability. Skin:  Skin is warm, dry and intact. No rash noted. Psychiatric: Mood and affect are normal. Speech and behavior are normal.  ____________________________________________   LABS (all labs ordered are listed, but only abnormal results are displayed)  Labs Reviewed  LIPASE, BLOOD - Abnormal; Notable for the following:       Result Value   Lipase 60 (*)    All other components within normal limits  COMPREHENSIVE METABOLIC PANEL - Abnormal; Notable for the following:    Glucose, Bld 118 (*)    Total Protein 8.5 (*)    ALT 56 (*)    All other components within normal limits  URINALYSIS COMPLETEWITH MICROSCOPIC ([facility] ONLY) - Abnormal; Notable for the following:    Color, Urine YELLOW (*)    APPearance CLEAR (*)    Hgb urine dipstick 1+ (*)    Squamous Epithelial / LPF 0-5 (*)    All other components within normal limits  CBC  POC URINE PREG, ED  POCT PREGNANCY, URINE   ____________________________________________  EKG  None ____________________________________________  RADIOLOGY  Pending ____________________________________________   PROCEDURES  Procedure(s) performed: None  Procedures  Critical Care performed: No  ____________________________________________   INITIAL IMPRESSION / ASSESSMENT AND PLAN / ED COURSE  Pertinent labs & imaging results that were available during my care of the [name] were reviewed by me and considered in my medical decision making (see chart for details).  50 year old female who presents with crampy mid abdominal pain for the past week, worsened with eating. History of cholecystectomy; will administer IV analgesia and proceed with CT abdomen/pelvis.  Clinical Course  Comment By Time  [name] working on oral contrast. Care transferred to Dr. Lenard LancePaduchowski pending results of CT scan. Irean HongJade J Blair Mesina, MD 10/24 [phone]      ____________________________________________   FINAL CLINICAL IMPRESSION(S) / ED DIAGNOSES  Final diagnoses:  Generalized abdominal pain      NEW MEDICATIONS STARTED DURING THIS VISIT:  New Prescriptions   No medications on file     Note:  This document was prepared using Dragon voice recognition software and may include unintentional dictation errors.    Irean HongJade J Acire Tang, MD 06/10/16 [phone]

## 2016-06-10 NOTE — ED Notes (Signed)
Pt states understanding of discharge instructions. NAD noted at this time.  

## 2016-06-10 NOTE — ED Triage Notes (Addendum)
Patient ambulatory to triage with steady gait, without difficulty or distress noted; pt reports via sign language interpreter, pt reports having mid abd cramping after eating x week; denies any accomp symptoms

## 2016-06-10 NOTE — ED Provider Notes (Signed)
-----------------------------------------   10:08 AM on 06/10/2016 -----------------------------------------  Patient CT scan most consistent with enteritis possibly a mild pancreatitis which is reflected also her lab work. I have discussed the results with the patient. I highly suspect a viral process/gastroenteritis. Patient states she is having diarrhea. States the abdominal pain is much improved. With a slight lipase elevation I discuss strict diet guidelines including clear liquid diet for the first 48 hours with slow progression back to a normal diet. We will discharge with pain and nausea medication. I discussed strict return precautions for any worsening pain, fever, or vomiting unable to keep down fluids. Patient is agreeable to this plan. Sign language interpreter used for my evaluation.   Minna AntisKevin Clevester Helzer, MD 06/10/16 1009

## 2016-06-10 NOTE — ED Notes (Signed)
Patient transported to CT 

## 2016-12-31 ENCOUNTER — Encounter: Payer: Self-pay | Admitting: Family Medicine

## 2018-06-15 ENCOUNTER — Encounter: Payer: Self-pay | Admitting: Family Medicine

## 2018-06-15 ENCOUNTER — Ambulatory Visit: Payer: Medicaid Other | Attending: Family Medicine | Admitting: Family Medicine

## 2018-06-15 VITALS — BP 188/123 | HR 89 | Temp 98.4°F | Resp 18 | Ht 64.0 in | Wt 191.2 lb

## 2018-06-15 DIAGNOSIS — R739 Hyperglycemia, unspecified: Secondary | ICD-10-CM | POA: Diagnosis not present

## 2018-06-15 DIAGNOSIS — H539 Unspecified visual disturbance: Secondary | ICD-10-CM

## 2018-06-15 DIAGNOSIS — I10 Essential (primary) hypertension: Secondary | ICD-10-CM

## 2018-06-15 DIAGNOSIS — Z8249 Family history of ischemic heart disease and other diseases of the circulatory system: Secondary | ICD-10-CM | POA: Insufficient documentation

## 2018-06-15 DIAGNOSIS — Z79899 Other long term (current) drug therapy: Secondary | ICD-10-CM | POA: Insufficient documentation

## 2018-06-15 DIAGNOSIS — I160 Hypertensive urgency: Secondary | ICD-10-CM | POA: Diagnosis not present

## 2018-06-15 DIAGNOSIS — R42 Dizziness and giddiness: Secondary | ICD-10-CM | POA: Diagnosis present

## 2018-06-15 DIAGNOSIS — Z9851 Tubal ligation status: Secondary | ICD-10-CM | POA: Diagnosis not present

## 2018-06-15 DIAGNOSIS — K219 Gastro-esophageal reflux disease without esophagitis: Secondary | ICD-10-CM

## 2018-06-15 DIAGNOSIS — R51 Headache: Secondary | ICD-10-CM | POA: Diagnosis present

## 2018-06-15 DIAGNOSIS — Z9049 Acquired absence of other specified parts of digestive tract: Secondary | ICD-10-CM | POA: Insufficient documentation

## 2018-06-15 LAB — POCT URINALYSIS DIP (CLINITEK)
Bilirubin, UA: NEGATIVE
Blood, UA: NEGATIVE
Glucose, UA: NEGATIVE mg/dL
Ketones, POC UA: NEGATIVE mg/dL
Leukocytes, UA: NEGATIVE
Nitrite, UA: NEGATIVE
POC,PROTEIN,UA: NEGATIVE
Spec Grav, UA: 1.025
Urobilinogen, UA: 0.2 U/dL
pH, UA: 6

## 2018-06-15 LAB — POCT GLYCOSYLATED HEMOGLOBIN (HGB A1C)
HbA1c POC (<> result, manual entry): 5.7 %
HbA1c, POC (controlled diabetic range): 5.7 % (ref 0.0–7.0)
HbA1c, POC (prediabetic range): 5.7 % (ref 5.7–6.4)
Hemoglobin A1C: 5.7 % — AB (ref 4.0–5.6)

## 2018-06-15 MED ORDER — CLONIDINE HCL 0.1 MG PO TABS
0.1000 mg | ORAL_TABLET | Freq: Once | ORAL | Status: AC
  Administered 2018-06-15: 0.1 mg via ORAL

## 2018-06-15 MED ORDER — PANTOPRAZOLE SODIUM 40 MG PO TBEC: 40.0000 mg | DELAYED_RELEASE_TABLET | Freq: Every day | ORAL | 3 refills | Status: DC

## 2018-06-15 MED ORDER — LISINOPRIL-HYDROCHLOROTHIAZIDE 20-25 MG PO TABS: 1.0000 | ORAL_TABLET | Freq: Every day | ORAL | 6 refills | Status: DC

## 2018-06-15 NOTE — Progress Notes (Signed)
Flu: no Pain: headaches,   Been off of blood pressure meds, bc powder,  Been off of meds since January   Needs refill on protonix and bp med,   Muscles hurt throbbing,   Eye dr in [employer]

## 2018-06-15 NOTE — Progress Notes (Signed)
Subjective:    Patient ID: Caitlin [name], female    DOB: April 23, 1966, 52 y.o.   MRN: [id number]   Due to a language barrier, a live interpreter was present for today's visit  HPI       52 year old female who presents to reestablish care.  On review of chart, it does not appear the patient has been in this office since February 2017.  Patient was seen 09/28/2015 in follow-up of hypertension and at that time expressed that she might be moving to a different city.  Patient at that time was on lisinopril hydrochlorothiazide 20-25 for hypertension and pantoprazole 40 mg for treatment of acid reflux.  Blood pressure was slightly elevated at that visit at 160/90.'      At today's visit, patient reports that she is out of her blood pressure medication and out of medication for acid reflux.  Patient has had some headaches and dizziness related to her blood pressure.  Headaches are dull, pressure sensation around her head.  Patient has also had occasional sensation of blurred vision.  Patient denies any urinary frequency, no increased thirst.  Patient has had no dysuria.  Patient denies any issues with focal numbness or weakness. Past Medical History:  Diagnosis Date  . Deaf   . Hypertension 2011  . NO (nasal obstruction)    Past Surgical History:  Procedure Laterality Date  . CESAREAN SECTION    . CHOLECYSTECTOMY    . TUBAL LIGATION     Family History  Problem Relation Age of Onset  . Heart disease Mother   . Stroke Mother    Social History   Tobacco Use  . Smoking status: Never Smoker  . Smokeless tobacco: Never Used  Substance Use Topics  . Alcohol use: Yes    Comment: occassionally   . Drug use: No   Allergies  Allergen Reactions  . Eggs Or Egg-Derived Products Nausea And Vomiting  . Milk-Related Compounds           Review of Systems  Constitutional: Positive for fatigue. Negative for chills and fever.  Eyes: Positive for visual disturbance. Negative for photophobia.   Respiratory: Negative for cough and shortness of breath.   Cardiovascular: Negative for chest pain, palpitations and leg swelling.  Gastrointestinal: Positive for nausea. Negative for abdominal pain.       Occasional nausea and reflux symptoms since running out of reflux medication  Endocrine: Negative for polydipsia, polyphagia and polyuria.  Genitourinary: Negative for dysuria and frequency.  Musculoskeletal: Negative for back pain and gait problem.  Neurological: Positive for dizziness and headaches. Negative for syncope, facial asymmetry, speech difficulty, weakness and numbness.       Objective:   Physical Exam BP (!) 218/114   Pulse 89   Temp 98.4 F (36.9 C) (Oral)   Resp 18   Ht 5\' 4"  (1.626 m)   Wt 191 lb 3.2 oz (86.7 kg)   SpO2 97%   BMI 32.82 kg/m : Nurse's notes and vital signs reviewed General-well-nourished, well-developed overweight for height female in no acute distress.  Patient is accompanied by her boyfriend at today's visit and also accompanied by an interpreter EENT-conjunctiva normal, patient with a slightly exophthalmic appearance to the eyes.  TMs light pink bilaterally, landmarks are visible, patient with mild edema of the nasal turbinates, patient with mild posterior pharynx erythema Neck-supple, no lymphadenopathy, possible borderline thyromegaly, no carotid bruit Lungs-clear to auscultation bilaterally Cardiovascular-regular rate and rhythm Abdomen-soft, nontender Back-no CVA tenderness Extremities-no edema  Neuro- cranial nerves III through XII are grossly intact       Assessment & Plan:  1. Hypertensive urgency Patient's initial blood pressure was elevated at 218/114 with a recheck at 232/154 therefore patient was given clonidine 0.1 mg x 1 here in the office with reduction of blood pressure to 188/123.  Patient reports that blood pressure in the past was controlled with lisinopril hydrochlorothiazide 20-25 a new prescription provided.  Importance  of daily compliance with blood pressure medication was stressed as well as the need for patient to return for reevaluation of blood pressure.  Patient will have urinalysis to look for proteinuria, CMP to look for elevated creatinine and CBC to look for anemia related to hypertensive urgency. - cloNIDine (CATAPRES) tablet 0.1 mg - POCT URINALYSIS DIP (CLINITEK) - Comprehensive metabolic panel - CBC with Differential  2. Essential hypertension Patient reports that in the past, blood pressure was controlled with lisinopril hydrochlorothiazide.  New prescription provided for patient to take once daily and DASH diet discussed.  Patient is encouraged to continue to monitor her blood pressure and to return for blood pressure recheck. - lisinopril-hydrochlorothiazide (PRINZIDE,ZESTORETIC) 20-25 MG tablet; Take 1 tablet by mouth daily. To lower blood pressure  Dispense: 30 tablet; Refill: 6  3. Gastroesophageal reflux disease, esophagitis presence not specified Patient with complaint of gastric reflux and prescription provided for pantoprazole 40 mg to take once daily and patient should also avoid known trigger foods, avoid spicy/greasy foods as well as avoidance of late night eating. - pantoprazole (PROTONIX) 40 MG tablet; Take 1 tablet (40 mg total) by mouth daily before supper. May refill as 30 or 90 day per patient/insurance preference  Dispense: 90 tablet; Refill: 3  4. Visual disturbance Patient with complaint of visual disturbance and patient will have CMP, TSH/T4 done in follow-up - T4 AND TSH  5. Elevated blood sugar On review of chart, patient had elevated blood sugar in 2017 with blood sugar of 118 on 06/10/2016.  Patient will have hemoglobin A1c done at today's visit in follow-up.  Patient will also have repeat glucose as part of CMP. - HgB A1c - Comprehensive metabolic panel  *Influenza immunization was offered at today's visit but declined by the patient  An After Visit Summary was  printed and given to the patient.  Return in about 4 weeks (around 07/13/2018) for BP check in 2-3 days; 4 weeks with PCP.

## 2018-06-16 LAB — CBC WITH DIFFERENTIAL/PLATELET
Basophils Absolute: 0 x10E3/uL (ref 0.0–0.2)
Basos: 0 %
EOS (ABSOLUTE): 0.1 x10E3/uL (ref 0.0–0.4)
Eos: 1 %
Hematocrit: 35.9 % (ref 34.0–46.6)
Hemoglobin: 12.3 g/dL (ref 11.1–15.9)
Immature Grans (Abs): 0 x10E3/uL (ref 0.0–0.1)
Immature Granulocytes: 0 %
Lymphocytes Absolute: 1.8 x10E3/uL (ref 0.7–3.1)
Lymphs: 32 %
MCH: 29.7 pg (ref 26.6–33.0)
MCHC: 34.3 g/dL (ref 31.5–35.7)
MCV: 87 fL (ref 79–97)
Monocytes Absolute: 0.5 x10E3/uL (ref 0.1–0.9)
Monocytes: 8 %
Neutrophils Absolute: 3.3 x10E3/uL (ref 1.4–7.0)
Neutrophils: 59 %
Platelets: 379 x10E3/uL (ref 150–450)
RBC: 4.14 x10E6/uL (ref 3.77–5.28)
RDW: 12.4 % (ref 12.3–15.4)
WBC: 5.6 x10E3/uL (ref 3.4–10.8)

## 2018-06-16 LAB — COMPREHENSIVE METABOLIC PANEL WITH GFR
ALT: 25 IU/L (ref 0–32)
AST: 19 IU/L (ref 0–40)
Albumin/Globulin Ratio: 1.3 (ref 1.2–2.2)
Albumin: 4.1 g/dL (ref 3.5–5.5)
Alkaline Phosphatase: 95 IU/L (ref 39–117)
BUN/Creatinine Ratio: 21 (ref 9–23)
BUN: 18 mg/dL (ref 6–24)
Bilirubin Total: 0.2 mg/dL (ref 0.0–1.2)
CO2: 25 mmol/L (ref 20–29)
Calcium: 9.5 mg/dL (ref 8.7–10.2)
Chloride: 102 mmol/L (ref 96–106)
Creatinine, Ser: 0.84 mg/dL (ref 0.57–1.00)
GFR calc Af Amer: 93 mL/min/1.73
GFR calc non Af Amer: 81 mL/min/1.73
Globulin, Total: 3.1 g/dL (ref 1.5–4.5)
Glucose: 99 mg/dL (ref 65–99)
Potassium: 3.8 mmol/L (ref 3.5–5.2)
Sodium: 143 mmol/L (ref 134–144)
Total Protein: 7.2 g/dL (ref 6.0–8.5)

## 2018-06-16 LAB — T4 AND TSH
T4, Total: 8 ug/dL (ref 4.5–12.0)
TSH: 1.36 u[IU]/mL (ref 0.450–4.500)

## 2018-06-18 ENCOUNTER — Ambulatory Visit: Payer: Medicaid Other | Attending: Family Medicine | Admitting: Pharmacist

## 2018-06-18 VITALS — BP 175/99 | HR 68

## 2018-06-18 DIAGNOSIS — Z823 Family history of stroke: Secondary | ICD-10-CM | POA: Insufficient documentation

## 2018-06-18 DIAGNOSIS — Z8249 Family history of ischemic heart disease and other diseases of the circulatory system: Secondary | ICD-10-CM | POA: Diagnosis not present

## 2018-06-18 DIAGNOSIS — Z79899 Other long term (current) drug therapy: Secondary | ICD-10-CM | POA: Insufficient documentation

## 2018-06-18 DIAGNOSIS — I10 Essential (primary) hypertension: Secondary | ICD-10-CM

## 2018-06-18 MED ORDER — AMLODIPINE BESYLATE 5 MG PO TABS: 5.0000 mg | ORAL_TABLET | Freq: Every day | ORAL | 0 refills | Status: DC

## 2018-06-18 NOTE — Progress Notes (Signed)
   S:   PCP: Dr. Jillyn Hidden   Patient arrives in good spirits. Presents to the clinic for hypertension management. Patient was referred and last seen by Dr. Jillyn Hidden on 06/15/18. BP elevated at 188/123. Pt given clonidine in clinic and started lisinopril-HCTZ 20-25 mg daily.  Patient reports adherence with medications.  Current BP Medications include:   - Prinzide 20-25 mg daily  Dietary habits include:  - Salt: reports that daughter adds salt to food when cooking - Reports daily intake of soda Exercise habits include: - Does not exercise Family / Social history:  - FH: heart disease (mother), stroke (mother) - Tobacco: never smoker - Alcohol: rarely  Home BP readings:  - not taking  O:  L arm after 5 minutes: 175/99, HR 68  Last 3 Office BP readings: BP Readings from Last 3 Encounters:  06/15/18 (!) 188/123  06/10/16 113/70  01/10/16 (!) 148/80   BMET    Component Value Date/Time   NA 143 06/15/2018 1545   NA 140 05/28/2012 1937   K 3.8 06/15/2018 1545   K 3.1 (L) 05/28/2012 1937   CL 102 06/15/2018 1545   CL 103 05/28/2012 1937   CO2 25 06/15/2018 1545   CO2 26 05/28/2012 1937   GLUCOSE 99 06/15/2018 1545   GLUCOSE 118 (H) 06/10/2016 0307   GLUCOSE 98 05/28/2012 1937   BUN 18 06/15/2018 1545   BUN 15 05/28/2012 1937   CREATININE 0.84 06/15/2018 1545   CREATININE 0.81 09/28/2015 1200   CALCIUM 9.5 06/15/2018 1545   CALCIUM 8.7 05/28/2012 1937   GFRNONAA 81 06/15/2018 1545   GFRNONAA 86 09/28/2015 1200   GFRAA 93 06/15/2018 1545   GFRAA >89 09/28/2015 1200    Renal function: Estimated Creatinine Clearance: 84.4 mL/min (by C-G formula based on SCr of 0.84 mg/dL).  A/P: Hypertension longstanding currently uncontrolled on current medications. Her BP is elevated but improved since last encounter. BP Goal <130/80 mmHg. Patient is adherent with current medications but has only re-started BP meds this week. Will continue Prinzide for now and add amlodipine before  bedtime.   -Continued lisinopril-HCTZ 20-25 mg daily.  - Add amlodipine 5 mg before bedtime. -Counseled on lifestyle modifications for blood pressure control including reduced dietary sodium, increased exercise, adequate sleep  Results reviewed and written information provided.   Total time in face-to-face counseling 15 minutes.   F/U Clinic Visit 07/07/18.    Patient seen with:  Leanne Chang, PharmD Candidate [employer] of 2021  Butch Penny, PharmD, CPP Clinical Pharmacist [facility] [phone]

## 2018-06-18 NOTE — Patient Instructions (Signed)
Thank you for coming to see [place] today.   Blood pressure today is improving.  Continue taking lisinopril-HCTZ in the morning. We are adding amlodipine at night.   Limiting salt and caffeine, as well as exercising as able for at least 30 minutes for 5 days out of the week, can also help you lower your blood pressure.  Take your blood pressure at home if you are able. Please write down these numbers and bring them to your visits.  If you have any questions about medications, please call me [phone].  Franky Macho

## 2018-07-07 ENCOUNTER — Encounter: Payer: Self-pay | Admitting: Family Medicine

## 2018-07-07 ENCOUNTER — Ambulatory Visit: Payer: Medicaid Other | Attending: Family Medicine | Admitting: Family Medicine

## 2018-07-07 VITALS — BP 153/91 | HR 79 | Temp 98.3°F | Resp 16 | Wt 192.2 lb

## 2018-07-07 DIAGNOSIS — Z79899 Other long term (current) drug therapy: Secondary | ICD-10-CM | POA: Insufficient documentation

## 2018-07-07 DIAGNOSIS — J209 Acute bronchitis, unspecified: Secondary | ICD-10-CM

## 2018-07-07 DIAGNOSIS — I10 Essential (primary) hypertension: Secondary | ICD-10-CM

## 2018-07-07 DIAGNOSIS — Z91012 Allergy to eggs: Secondary | ICD-10-CM | POA: Diagnosis not present

## 2018-07-07 DIAGNOSIS — Z8249 Family history of ischemic heart disease and other diseases of the circulatory system: Secondary | ICD-10-CM | POA: Insufficient documentation

## 2018-07-07 DIAGNOSIS — Z9889 Other specified postprocedural states: Secondary | ICD-10-CM | POA: Diagnosis not present

## 2018-07-07 DIAGNOSIS — Z9049 Acquired absence of other specified parts of digestive tract: Secondary | ICD-10-CM | POA: Diagnosis not present

## 2018-07-07 DIAGNOSIS — H6691 Otitis media, unspecified, right ear: Secondary | ICD-10-CM

## 2018-07-07 DIAGNOSIS — Z91011 Allergy to milk products: Secondary | ICD-10-CM | POA: Diagnosis not present

## 2018-07-07 MED ORDER — AMLODIPINE BESYLATE 10 MG PO TABS: 10.0000 mg | ORAL_TABLET | Freq: Every day | ORAL | 1 refills | Status: DC

## 2018-07-07 MED ORDER — AZITHROMYCIN 250 MG PO TABS: ORAL_TABLET | ORAL | 0 refills | Status: DC

## 2018-07-07 NOTE — Progress Notes (Signed)
Pt states she has been coughing for a week  Pt states her cough keeps her up at night

## 2018-07-07 NOTE — Progress Notes (Signed)
Subjective:    Patient ID: Caitlin [name], female    DOB: 1966-03-30, 52 y.o.   MRN: [id number]   Due to language barrier, patient is accompanied by a sign language interpreter at today's visit  HPI 52 year old deaf female seen in follow-up of hypertension for which she was prescribed amlodipine 5 mg at her most recent visit.  Patient also at today's visit has complaint of approximately 1 week of a productive cough of yellow-green sputum.  Patient with some nasal congestion and postnasal drainage.  Patient with sore throat/throat irritation secondary to cough and postnasal drainage.  Patient also has some dull pain in her right ear.  Patient denies any fever or chills, no headache or dizziness.  Cough awakens her at night.  Patient has only taken over-the-counter cough drops to help with the cough.  Patient has had the sensation of chest tightness but no wheezing.  Patient does have a 447-year-old with asthma.      Patient has been taking the blood pressure medication.  Patient denies any headaches or dizziness related to blood pressure medication or blood pressure, no peripheral edema.  Patient denies any chest pain or palpitations. Past Medical History:  Diagnosis Date  . Deaf   . Hypertension 2011  . NO (nasal obstruction)    Past Surgical History:  Procedure Laterality Date  . CESAREAN SECTION    . CHOLECYSTECTOMY    . TUBAL LIGATION     Family History  Problem Relation Age of Onset  . Heart disease Mother   . Stroke Mother    Social History   Tobacco Use  . Smoking status: Never Smoker  . Smokeless tobacco: Never Used  Substance Use Topics  . Alcohol use: Yes    Comment: occassionally   . Drug use: No   Allergies  Allergen Reactions  . Eggs Or Egg-Derived Products Nausea And Vomiting  . Milk-Related Compounds     Current Outpatient Medications:  .  amLODipine (NORVASC) 10 MG tablet, Take 1 tablet (10 mg total) by mouth daily. To lower blood pressure, Disp: 90  tablet, Rfl: 1 .  azithromycin (ZITHROMAX) 250 MG tablet, Take 2 pills the first day then 1 pill daily for 4 days, Disp: 6 tablet, Rfl: 0 .  lisinopril-hydrochlorothiazide (PRINZIDE,ZESTORETIC) 20-25 MG tablet, Take 1 tablet by mouth daily. To lower blood pressure, Disp: 30 tablet, Rfl: 6 .  pantoprazole (PROTONIX) 40 MG tablet, Take 1 tablet (40 mg total) by mouth daily before supper. May refill as 30 or 90 day per patient/insurance preference, Disp: 90 tablet, Rfl: 3  Review of Systems  Constitutional: Positive for fatigue. Negative for chills and fever.  HENT: Positive for congestion, ear pain, hearing loss, postnasal drip, rhinorrhea and sore throat. Negative for trouble swallowing.   Respiratory: Positive for cough and chest tightness. Negative for shortness of breath and wheezing.   Cardiovascular: Negative for chest pain, palpitations and leg swelling.  Gastrointestinal: Negative for abdominal pain, constipation, diarrhea and nausea.  Genitourinary: Negative for dysuria, flank pain, frequency and urgency.  Musculoskeletal: Negative for back pain, gait problem and joint swelling.  Neurological: Negative for dizziness and headaches.       Objective:   Physical Exam BP (!) 153/91   Pulse 79   Temp 98.3 F (36.8 C) (Oral)   Resp 16   Wt 192 lb 3.2 oz (87.2 kg)   SpO2 96%   BMI 32.99 kg/m  Nurse's notes and vital signs reviewed General- well-nourished, well-developed overweight female  in no acute distress sitting on the exam table.  Patient does appear to have some audible lung noise with breathing.  Patient with an occasional cough while in the exam room.  Patient is accompanied by an interpreter ENT- right TM is pink and slightly retracted, no visible landmarks, left TM is dull, patient moderate edema/erythema of the nasal turbinates with clear to white nasal discharge, patient with poorly visualized posterior pharynx but does have some erythema Neck-supple, no  lymphadenopathy Lungs- decreased breath sounds throughout, no increased work of breathing, possible very mild coarse breath sounds Cardiovascular-regular rate and rhythm Back-no CVA tenderness Extremities-no edema        Assessment & Plan:  1. Essential hypertension Patient's blood pressure remains elevated at today's visit.  Patient's amlodipine will be increased from 5 mg to 10 mg.  Through interpreter, patient was told that she can take 2 of her current 5 mg pills until she picks up the refill for 10 mg dose.  Patient will continue use of lisinopril-hydrochlorothiazide as well.  Patient will return to clinic in the next few weeks for blood pressure recheck by clinical pharmacist and patient would also like to have her influenza immunization at that time.  If patient's blood pressure is normal at her follow-up visit for recheck with clinical pharmacist, patient will be seen in 4 months.  If blood pressure is abnormal, this will be discussed with the clinical pharmacist to see if additional changes such as an increase in the dose of lisinopril will be needed. - amLODipine (NORVASC) 10 MG tablet; Take 1 tablet (10 mg total) by mouth daily. To lower blood pressure  Dispense: 90 tablet; Refill: 1  2. Acute bronchitis, unspecified organism Patient with acute bronchitis and right otitis media.  Patient will be placed on azithromycin Z-Pak.  Patient was made aware through the interpreter to avoid medications containing decongestants as these medications can increase the blood pressure.  Patient given written instructions that she may take mucolytic such as Mucinex, antihistamines such as Claritin, Zyrtec or Benadryl as well as dextromethorphan for cough.  Generic Robitussin-DM was recommended to help with current cough and chest congestion. - azithromycin (ZITHROMAX) 250 MG tablet; Take 2 pills the first day then 1 pill daily for 4 days  Dispense: 6 tablet; Refill: 0  3.  Right otitis media Patient is  being placed on azithromycin for treatment of bronchitis and right otitis media.  Patient should return to clinic next week if she is not feeling any better.  *Patient requested influenza immunization at today's visit but because patient with acute illness, patient was asked to return to clinic and have influenza immunization in approximately 2 weeks when she returns for her blood pressure recheck.  An After Visit Summary was printed and given to the patient.  Return in about 4 months (around 11/05/2018) for BP check and flu shot-2-3 weeks.

## 2018-11-05 ENCOUNTER — Ambulatory Visit: Payer: Medicaid Other | Attending: Family Medicine | Admitting: Family Medicine

## 2018-11-05 ENCOUNTER — Encounter: Payer: Self-pay | Admitting: Family Medicine

## 2018-11-05 ENCOUNTER — Other Ambulatory Visit: Payer: Self-pay

## 2018-11-05 VITALS — BP 132/78 | HR 76 | Temp 98.1°F | Ht 64.0 in | Wt 190.6 lb

## 2018-11-05 DIAGNOSIS — Z91011 Allergy to milk products: Secondary | ICD-10-CM | POA: Diagnosis not present

## 2018-11-05 DIAGNOSIS — I10 Essential (primary) hypertension: Secondary | ICD-10-CM

## 2018-11-05 DIAGNOSIS — R5383 Other fatigue: Secondary | ICD-10-CM | POA: Diagnosis not present

## 2018-11-05 DIAGNOSIS — Z9049 Acquired absence of other specified parts of digestive tract: Secondary | ICD-10-CM | POA: Diagnosis not present

## 2018-11-05 DIAGNOSIS — N951 Menopausal and female climacteric states: Secondary | ICD-10-CM

## 2018-11-05 DIAGNOSIS — Z79899 Other long term (current) drug therapy: Secondary | ICD-10-CM | POA: Diagnosis not present

## 2018-11-05 DIAGNOSIS — Z91012 Allergy to eggs: Secondary | ICD-10-CM | POA: Diagnosis not present

## 2018-11-05 DIAGNOSIS — Z1211 Encounter for screening for malignant neoplasm of colon: Secondary | ICD-10-CM

## 2018-11-05 DIAGNOSIS — R7303 Prediabetes: Secondary | ICD-10-CM

## 2018-11-05 DIAGNOSIS — Z8249 Family history of ischemic heart disease and other diseases of the circulatory system: Secondary | ICD-10-CM | POA: Diagnosis not present

## 2018-11-05 DIAGNOSIS — J3089 Other allergic rhinitis: Secondary | ICD-10-CM | POA: Diagnosis not present

## 2018-11-05 DIAGNOSIS — J309 Allergic rhinitis, unspecified: Secondary | ICD-10-CM | POA: Diagnosis not present

## 2018-11-05 DIAGNOSIS — Z1239 Encounter for other screening for malignant neoplasm of breast: Secondary | ICD-10-CM

## 2018-11-05 DIAGNOSIS — K219 Gastro-esophageal reflux disease without esophagitis: Secondary | ICD-10-CM

## 2018-11-05 MED ORDER — LORATADINE 10 MG PO TABS: 10.0000 mg | ORAL_TABLET | Freq: Every day | ORAL | 3 refills | Status: DC

## 2018-11-05 MED ORDER — LISINOPRIL-HYDROCHLOROTHIAZIDE 20-25 MG PO TABS: 1.0000 | ORAL_TABLET | Freq: Every day | ORAL | 3 refills | Status: DC

## 2018-11-05 MED ORDER — PANTOPRAZOLE SODIUM 40 MG PO TBEC: 40.0000 mg | DELAYED_RELEASE_TABLET | Freq: Every day | ORAL | 3 refills | Status: DC

## 2018-11-05 NOTE — Patient Instructions (Addendum)
Please try over the counter Black Cohosh or Estroven to help with menopausal symptoms, hot flashes. Please try over the counter Melatonin 5 mg or less to help with sleep.  Menopause Menopause is the normal time of life when menstrual periods stop completely. It is usually confirmed by 12 months without a menstrual period. The transition to menopause (perimenopause) most often happens between the ages of 40 and 53. During perimenopause, hormone levels change in your body, which can cause symptoms and affect your health. Menopause may increase your risk for:  Loss of bone (osteoporosis), which causes bone breaks (fractures).  Depression.  Hardening and narrowing of the arteries (atherosclerosis), which can cause heart attacks and strokes. What are the causes? This condition is usually caused by a natural change in hormone levels that happens as you get older. The condition may also be caused by surgery to remove both ovaries (bilateral oophorectomy). What increases the risk? This condition is more likely to start at an earlier age if you have certain medical conditions or treatments, including:  A tumor of the pituitary gland in the brain.  A disease that affects the ovaries and hormone production.  Radiation treatment for cancer.  Certain cancer treatments, such as chemotherapy or hormone (anti-estrogen) therapy.  Heavy smoking and excessive alcohol use.  Family history of early menopause. This condition is also more likely to develop earlier in women who are very thin. What are the signs or symptoms? Symptoms of this condition include:  Hot flashes.  Irregular menstrual periods.  Night sweats.  Changes in feelings about sex. This could be a decrease in sex drive or an increased comfort around your sexuality.  Vaginal dryness and thinning of the vaginal walls. This may cause painful intercourse.  Dryness of the skin and development of wrinkles.  Headaches.  Problems sleeping  (insomnia).  Mood swings or irritability.  Memory problems.  Weight gain.  Hair growth on the face and chest.  Bladder infections or problems with urinating. How is this diagnosed? This condition is diagnosed based on your medical history, a physical exam, your age, your menstrual history, and your symptoms. Hormone tests may also be done. How is this treated? In some cases, no treatment is needed. You and your health care provider should make a decision together about whether treatment is necessary. Treatment will be based on your individual condition and preferences. Treatment for this condition focuses on managing symptoms. Treatment may include:  Menopausal hormone therapy (MHT).  Medicines to treat specific symptoms or complications.  Acupuncture.  Vitamin or herbal supplements. Before starting treatment, make sure to let your health care provider know if you have a personal or family history of:  Heart disease.  Breast cancer.  Blood clots.  Diabetes.  Osteoporosis. Follow these instructions at home: Lifestyle  Do not use any products that contain nicotine or tobacco, such as cigarettes and e-cigarettes. If you need help quitting, ask your health care provider.  Get at least 30 minutes of physical activity on 5 or more days each week.  Avoid alcoholic and caffeinated beverages, as well as spicy foods. This may help prevent hot flashes.  Get 7-8 hours of sleep each night.  If you have hot flashes, try: ? Dressing in layers. ? Avoiding things that may trigger hot flashes, such as spicy food, warm places, or stress. ? Taking slow, deep breaths when a hot flash starts. ? Keeping a fan in your home and office.  Find ways to manage stress, such as  deep breathing, meditation, or journaling.  Consider going to group therapy with other women who are having menopause symptoms. Ask your health care provider about recommended group therapy meetings. Eating and  drinking  Eat a healthy, balanced diet that contains whole grains, lean protein, low-fat dairy, and plenty of fruits and vegetables.  Your health care provider may recommend adding more soy to your diet. Foods that contain soy include tofu, tempeh, and soy milk.  Eat plenty of foods that contain calcium and vitamin D for bone health. Items that are rich in calcium include low-fat milk, yogurt, beans, almonds, sardines, broccoli, and kale. Medicines  Take over-the-counter and prescription medicines only as told by your health care provider.  Talk with your health care provider before starting any herbal supplements. If prescribed, take vitamins and supplements as told by your health care provider. These may include: ? Calcium. Women age 53 and older should get 1,200 mg (milligrams) of calcium every day. ? Vitamin D. Women need 600-800 International Units of vitamin D each day. ? Vitamins B12 and B6. Aim for 50 micrograms of B12 and 1.5 mg of B6 each day. General instructions  Keep track of your menstrual periods, including: ? When they occur. ? How heavy they are and how long they last. ? How much time passes between periods.  Keep track of your symptoms, noting when they start, how often you have them, and how long they last.  Use vaginal lubricants or moisturizers to help with vaginal dryness and improve comfort during sex.  Keep all follow-up visits as told by your health care provider. This is important. This includes any group therapy or counseling. Contact a health care provider if:  You are still having menstrual periods after age 40.  You have pain during sex.  You have not had a period for 12 months and you develop vaginal bleeding. Get help right away if:  You have: ? Severe depression. ? Excessive vaginal bleeding. ? Pain when you urinate. ? A fast or irregular heart beat (palpitations). ? Severe headaches. ? Abdomen (abdominal) pain or severe indigestion.  You  fell and you think you have a broken bone.  You develop leg or chest pain.  You develop vision problems.  You feel a lump in your breast. Summary  Menopause is the normal time of life when menstrual periods stop completely. It is usually confirmed by 12 months without a menstrual period.  The transition to menopause (perimenopause) most often happens between the ages of 58 and 35.  Symptoms can be managed through medicines, lifestyle changes, and complementary therapies such as acupuncture.  Eat a balanced diet that is rich in nutrients to promote bone health and heart health and to manage symptoms during menopause. This information is not intended to replace advice given to you by your health care provider. Make sure you discuss any questions you have with your health care provider. Document Released: 10/25/2003 Document Revised: 09/06/2016 Document Reviewed: 09/06/2016 Elsevier Interactive Patient Education  2019 Elsevier Inc.  Preventing Type 2 Diabetes Mellitus Type 2 diabetes (type 2 diabetes mellitus) is a long-term (chronic) disease that affects blood sugar (glucose) levels. Normally, a hormone called insulin allows glucose to enter cells in the body. The cells use glucose for energy. In type 2 diabetes, one or both of these problems may be present:  The body does not make enough insulin.  The body does not respond properly to insulin that it makes (insulin resistance). Insulin resistance or lack of  insulin causes excess glucose to build up in the blood instead of going into cells. As a result, high blood glucose (hyperglycemia) develops, which can cause many complications. Being overweight or obese and having an inactive (sedentary) lifestyle can increase your risk for diabetes. Type 2 diabetes can be delayed or prevented by making certain nutrition and lifestyle changes. What nutrition changes can be made?   Eat healthy meals and snacks regularly. Keep a healthy snack with you  for when you get hungry between meals, such as fruit or a handful of nuts.  Eat lean meats and proteins that are low in saturated fats, such as chicken, fish, egg whites, and beans. Avoid processed meats.  Eat plenty of fruits and vegetables and plenty of grains that have not been processed (whole grains). It is recommended that you eat: ? 1?2 cups of fruit every day. ? 2?3 cups of vegetables every day. ? 6?8 oz of whole grains every day, such as oats, whole wheat, bulgur, brown rice, quinoa, and millet.  Eat low-fat dairy products, such as milk, yogurt, and cheese.  Eat foods that contain healthy fats, such as nuts, avocado, olive oil, and canola oil.  Drink water throughout the day. Avoid drinks that contain added sugar, such as soda or sweet tea.  Follow instructions from your health care provider about specific eating or drinking restrictions.  Control how much food you eat at a time (portion size). ? Check food labels to find out the serving sizes of foods. ? Use a kitchen scale to weigh amounts of foods.  Saute or steam food instead of frying it. Cook with water or broth instead of oils or butter.  Limit your intake of: ? Salt (sodium). Have no more than 1 tsp (2,400 mg) of sodium a day. If you have heart disease or high blood pressure, have less than ? tsp (1,500 mg) of sodium a day. ? Saturated fat. This is fat that is solid at room temperature, such as butter or fat on meat. What lifestyle changes can be made? Activity   Do moderate-intensity physical activity for at least 30 minutes on at least 5 days of the week, or as much as told by your health care provider.  Ask your health care provider what activities are safe for you. A mix of physical activities may be best, such as walking, swimming, cycling, and strength training.  Try to add physical activity into your day. For example: ? Park in spots that are farther away than usual, so that you walk more. For example,  park in a far corner of the parking lot when you go to the office or the grocery store. ? Take a walk during your lunch break. ? Use stairs instead of elevators or escalators. Weight Loss  Lose weight as directed. Your health care provider can determine how much weight loss is best for you and can help you lose weight safely.  If you are overweight or obese, you may be instructed to lose at least 5?7 % of your body weight. Alcohol and Tobacco   Limit alcohol intake to no more than 1 drink a day for nonpregnant women and 2 drinks a day for men. One drink equals 12 oz of beer, 5 oz of wine, or 1 oz of hard liquor.  Do not use any tobacco products, such as cigarettes, chewing tobacco, and e-cigarettes. If you need help quitting, ask your health care provider. Work With Your Health Care Provider  Have your blood  glucose tested regularly, as told by your health care provider.  Discuss your risk factors and how you can reduce your risk for diabetes.  Get screening tests as told by your health care provider. You may have screening tests regularly, especially if you have certain risk factors for type 2 diabetes.  Make an appointment with a diet and nutrition specialist (registered dietitian). A registered dietitian can help you make a healthy eating plan and can help you understand portion sizes and food labels. Why are these changes important?  It is possible to prevent or delay type 2 diabetes and related health problems by making lifestyle and nutrition changes.  It can be difficult to recognize signs of type 2 diabetes. The best way to avoid possible damage to your body is to take actions to prevent the disease before you develop symptoms. What can happen if changes are not made?  Your blood glucose levels may keep increasing. Having high blood glucose for a long time is dangerous. Too much glucose in your blood can damage your blood vessels, heart, kidneys, nerves, and eyes.  You may  develop prediabetes or type 2 diabetes. Type 2 diabetes can lead to many chronic health problems and complications, such as: ? Heart disease. ? Stroke. ? Blindness. ? Kidney disease. ? Depression. ? Poor circulation in the feet and legs, which could lead to surgical removal (amputation) in severe cases. Where to find support  Ask your health care provider to recommend a registered dietitian, diabetes educator, or weight loss program.  Look for local or online weight loss groups.  Join a gym, fitness club, or outdoor activity group, such as a walking club. Where to find more information To learn more about diabetes and diabetes prevention, visit:  American Diabetes Association (ADA): www.diabetes.[employer]  National Institute of Diabetes and Digestive and Kidney Diseases: ToyArticles.cawww.niddk.nih.gov/health-information/diabetes To learn more about healthy eating, visit:  The U.S. Department of Agriculture Architect(USDA), Choose My Plate: http://yates.biz/www.choosemyplate.gov/food-groups  Office of Disease Prevention and Health Promotion (ODPHP), Dietary Guidelines: ListingMagazine.siwww.health.gov/dietaryguidelines Summary  You can reduce your risk for type 2 diabetes by increasing your physical activity, eating healthy foods, and losing weight as directed.  Talk with your health care provider about your risk for type 2 diabetes. Ask about any blood tests or screening tests that you need to have. This information is not intended to replace advice given to you by your health care provider. Make sure you discuss any questions you have with your health care provider. Document Released: 11/26/2015 Document Revised: 07/16/2017 Document Reviewed: 09/25/2015 Elsevier Interactive Patient Education  2019 [employer]

## 2018-11-05 NOTE — Progress Notes (Signed)
Established Patient Office Visit  Subjective:  Patient ID: Caitlin [name], female    DOB: 07-03-1966  Age: 53 y.o. MRN: [id number]   Due to a communication barrier, patient is accompanied by a sign language interpreter at today's visit  CC:  Chief Complaint  Patient presents with  . Hypertension    HPI Caitlin [name] presents for ongoing follow-up and medical management of chronic conditions including hypertension, prediabetes, acid reflux and patient with recent increase in allergic rhinitis symptoms.  Patient also with a new complaint of increased fatigue secondary to menopausal symptoms, hot flashes.  Patient with complaint of being awakened at night from her sleep due to hot flashes and patient with poor sleep and secondary fatigue.  Patient reports that she is still having her menses however she has not had her period since November of last year.      Patient reports that she is taking her lisinopril hydrochlorothiazide without any problems.  Patient is not currently taking amlodipine.  Patient believes that her blood pressure has been well controlled and she denies any headaches or dizziness related to her blood pressure.  Patient continues to take pantoprazole for acid reflux and denies any reflux symptoms unless she forgets to take the medication for a few days.  Patient denies any increased thirst, no urinary frequency, or blurred vision related to her prediabetes.  Patient's last hemoglobin A1c in October 2019 was 5.7.  Patient admits that she has not made a lot of dietary changes and does not exercise on a regular basis but does intend to start exercising as well as making changes to her diet to lose weight and to help with the prediabetes.       Patient with complaint of 1 to 2 weeks of increased nasal congestion, sensation of postnasal drainage and sneezing.  She has had some mild clear nasal discharge.  She denies any headaches, no fever or chills, no sore throat or  difficulty swallowing.       Regarding health maintenance, patient has not yet had her colonoscopy but would be agreeable to being referred to have this done.  Patient also needs her mammogram scheduled.  Patient will make well visit appointment for her Pap smear.  Past Medical History:  Diagnosis Date  . Deaf   . Hypertension 2011  . NO (nasal obstruction)     Past Surgical History:  Procedure Laterality Date  . CESAREAN SECTION    . CHOLECYSTECTOMY    . TUBAL LIGATION      Family History  Problem Relation Age of Onset  . Heart disease Mother   . Stroke Mother     Social History   Socioeconomic History  . Marital status: Married    Spouse name: Not on file  . Number of children: 3   . Years of education: Not on file  . Highest education level: Not on file  Occupational History    Employer: UNEMPLOYED  Social Needs  . Financial resource strain: Not on file  . Food insecurity:    Worry: Not on file    Inability: Not on file  . Transportation needs:    Medical: Not on file    Non-medical: Not on file  Tobacco Use  . Smoking status: Never Smoker  . Smokeless tobacco: Never Used  Substance and Sexual Activity  . Alcohol use: Yes    Comment: occassionally   . Drug use: No  . Sexual activity: Yes    Birth control/protection:  Surgical  Lifestyle  . Physical activity:    Days per week: Not on file    Minutes per session: Not on file  . Stress: Not on file  Relationships  . Social connections:    Talks on phone: Not on file    Gets together: Not on file    Attends religious service: Not on file    Active member of club or organization: Not on file    Attends meetings of clubs or organizations: Not on file    Relationship status: Not on file  . Intimate partner violence:    Fear of current or ex partner: Not on file    Emotionally abused: Not on file    Physically abused: Not on file    Forced sexual activity: Not on file  Other Topics Concern  . Not on file   Social History Narrative  . Not on file    Outpatient Medications Prior to Visit  Medication Sig Dispense Refill  . lisinopril-hydrochlorothiazide (PRINZIDE,ZESTORETIC) 20-25 MG tablet Take 1 tablet by mouth daily. To lower blood pressure 30 tablet 6  . pantoprazole (PROTONIX) 40 MG tablet Take 1 tablet (40 mg total) by mouth daily before supper. May refill as 30 or 90 day per patient/insurance preference 90 tablet 3  . amLODipine (NORVASC) 10 MG tablet Take 1 tablet (10 mg total) by mouth daily. To lower blood pressure (Patient not taking: Reported on 11/05/2018) 90 tablet 1  . azithromycin (ZITHROMAX) 250 MG tablet Take 2 pills the first day then 1 pill daily for 4 days (Patient not taking: Reported on 11/05/2018) 6 tablet 0   No facility-administered medications prior to visit.     Allergies  Allergen Reactions  . Eggs Or Egg-Derived Products Nausea And Vomiting  . Milk-Related Compounds     ROS Review of Systems  Constitutional: Positive for fatigue. Negative for chills and fever.  HENT: Positive for congestion, hearing loss, postnasal drip, rhinorrhea and sneezing. Negative for ear pain, nosebleeds, sinus pressure, sinus pain, sore throat and trouble swallowing.   Eyes: Negative for photophobia and visual disturbance.  Respiratory: Negative for cough and shortness of breath.   Gastrointestinal: Negative for abdominal pain, blood in stool, constipation, diarrhea and nausea.  Endocrine: Negative for polydipsia, polyphagia and polyuria.  Genitourinary: Positive for menstrual problem. Negative for dysuria and frequency.  Musculoskeletal: Negative for arthralgias and back pain.  Neurological: Negative for dizziness and headaches.  Hematological: Negative for adenopathy. Does not bruise/bleed easily.  Psychiatric/Behavioral: Positive for sleep disturbance. Negative for self-injury. The patient is not nervous/anxious.       Objective:    Physical Exam  Constitutional: She is  oriented to person, place, and time. She appears well-developed and well-nourished.  Obese female in NAD who has accompanied by her husband as well as a sign language interpreter at today's visit  HENT:  Head: Normocephalic and atraumatic.  Right Ear: External ear and ear canal normal. Tympanic membrane is retracted.  Left Ear: External ear and ear canal normal. Tympanic membrane is retracted.  Nose: Mucosal edema and rhinorrhea (mild, clear) present. Right sinus exhibits no maxillary sinus tenderness and no frontal sinus tenderness. Left sinus exhibits no maxillary sinus tenderness and no frontal sinus tenderness.  Mouth/Throat: Posterior oropharyngeal edema and posterior oropharyngeal erythema (mild) present.  Eyes: Conjunctivae and EOM are normal.  Neck: Normal range of motion. Neck supple.  Cardiovascular: Normal rate and regular rhythm.  Pulmonary/Chest: Effort normal and breath sounds normal. She has no wheezes.  Abdominal: Soft. There is no abdominal tenderness. There is no rebound and no guarding.  Musculoskeletal: Normal range of motion.        General: No tenderness or edema.  Lymphadenopathy:    She has no cervical adenopathy.  Neurological: She is alert and oriented to person, place, and time.  Skin: Skin is warm and dry.  Psychiatric: She has a normal mood and affect. Her behavior is normal.  Nursing note and vitals reviewed.   BP 132/78   Pulse 76   Temp 98.1 F (36.7 C) (Oral)   Ht  (1.626 m)   Wt 190 lb 9.6 oz (86.5 kg)   SpO2 96%   BMI 32.72 kg/m  Wt Readings from Last 3 Encounters:  11/05/18 190 lb 9.6 oz (86.5 kg)  07/07/18 192 lb 3.2 oz (87.2 kg)  06/15/18 191 lb 3.2 oz (86.7 kg)     Health Maintenance Due  Topic Date Due  . TETANUS/TDAP  08/05/1985  . MAMMOGRAM  08/05/2016  . COLONOSCOPY  08/05/2016  . PAP SMEAR-Modifier  05/01/2017  . INFLUENZA VACCINE  03/18/2018    There are no preventive care reminders to display for this patient.  Lab  Results  Component Value Date   TSH 1.360 06/15/2018   Lab Results  Component Value Date   WBC 5.6 06/15/2018   HGB 12.3 06/15/2018   HCT 35.9 06/15/2018   MCV 87 06/15/2018   PLT 379 06/15/2018   Lab Results  Component Value Date   NA 143 06/15/2018   K 3.8 06/15/2018   CO2 25 06/15/2018   GLUCOSE 99 06/15/2018   BUN 18 06/15/2018   CREATININE 0.84 06/15/2018   BILITOT 0.2 06/15/2018   ALKPHOS 95 06/15/2018   AST 19 06/15/2018   ALT 25 06/15/2018   PROT 7.2 06/15/2018   ALBUMIN 4.1 06/15/2018   CALCIUM 9.5 06/15/2018   ANIONGAP 8 06/10/2016   Lab Results  Component Value Date   CHOL 147 03/06/2012   Lab Results  Component Value Date   HDL 37 (L) 03/06/2012   Lab Results  Component Value Date   LDLCALC 103 (H) 03/06/2012   Lab Results  Component Value Date   TRIG 35 03/06/2012   Lab Results  Component Value Date   CHOLHDL 4.0 03/06/2012   Lab Results  Component Value Date   HGBA1C 5.7 (A) 06/15/2018   HGBA1C 5.7 06/15/2018   HGBA1C 5.7 06/15/2018   HGBA1C 5.7 06/15/2018      Assessment & Plan:  1. Essential hypertension Blood pressure stable and reasonably controlled on her current medications.  Patient provided with refill of lisinopril 20 mg-hydrochlorothiazide 25 mg.  Patient will also have lipid panel done at today's visit.  Patient will have BMP in follow-up of hypertension and long-term use of medication as lisinopril and hydrochlorothiazide may affect creatinine/kidney function and potassium level - lisinopril-hydrochlorothiazide (PRINZIDE,ZESTORETIC) 20-25 MG tablet; Take 1 tablet by mouth daily. To lower blood pressure  Dispense: 90 tablet; Refill: 3 - Lipid panel - Basic Metabolic Panel  2. Allergic rhinitis due to other allergic trigger, unspecified seasonality Patient with allergic rhinitis.  Patient prefers appeal over a nasal spray.  Prescription provided for loratadine 10 mg 1 daily as this medication is nondrowsy and will not affect  patient's blood pressure.  Patient is advised to avoid use of decongestants which may raise the blood pressure - loratadine (CLARITIN) 10 MG tablet; Take 1 tablet (10 mg total) by mouth daily. As needed for  allergy symptoms/congestion  Dispense: 90 tablet; Refill: 3  3. Pre-diabetes Patient with prediabetes.  Last hemoglobin A1c was 5.7 in October.  Patient is encouraged to follow a low carbohydrate diet and start a regular exercise program.  Patient will have repeat BMP and hemoglobin A1c at today's visit - Basic Metabolic Panel - Hemoglobin A1c  4. Gastroesophageal reflux disease, esophagitis presence not specified Patient reports that she is doing well on Protonix which she will continue for acid reflux.  Patient is encouraged to avoid late night eating and to avoid foods which are known to trigger her reflux symptoms as well as spicy/greasy foods - pantoprazole (PROTONIX) 40 MG tablet; Take 1 tablet (40 mg total) by mouth daily before supper. To reduce stomach acid  Dispense: 90 tablet; Refill: 3  5. Menopausal symptoms Patient with menopausal symptoms and patient is encouraged to try over-the-counter supplements such as black cohosh or Estroven to help with menopausal symptoms/hot flashes.  Patient will also schedule well exam for Pap smear/pelvic exam but should call or return sooner if she has worsening symptoms or any concerns  6. Fatigue, unspecified type Patient with complaint of fatigue which is likely related to her current issues with sleep disturbance from hot flashes.  Patient will have basic metabolic panel and repeat hemoglobin A1c at today's visit in follow-up of prediabetes and to check for any electrolyte abnormalities..  Patient had normal thyroid labs and complete blood count done in October of last year. - Basic Metabolic Panel  7. Encounter for long-term (current) use of medications Patient will have BMP done in follow-up of long-term use of medications for treatment of  hypertension - Basic Metabolic Panel  8. Screening for colon cancer Patient agrees to be referred to gastroenterology for baseline screening colonoscopy - Ambulatory referral to Gastroenterology  9. Screening for breast cancer Order placed for patient to have screening mammogram - MM Digital Screening; Future  An After Visit Summary was printed and given to the patient.  Follow-up: Return in about 6 months (around 05/08/2019) for HTN/prediabetes and return as needed.   Cain Saupe, MD

## 2018-11-06 LAB — LIPID PANEL
Chol/HDL Ratio: 5.7 ratio — ABNORMAL HIGH (ref 0.0–4.4)
Cholesterol, Total: 200 mg/dL — ABNORMAL HIGH (ref 100–199)
HDL: 35 mg/dL — ABNORMAL LOW
LDL Calculated: 146 mg/dL — ABNORMAL HIGH (ref 0–99)
Triglycerides: 96 mg/dL (ref 0–149)
VLDL Cholesterol Cal: 19 mg/dL (ref 5–40)

## 2018-11-06 LAB — BASIC METABOLIC PANEL WITH GFR
BUN/Creatinine Ratio: 16 (ref 9–23)
BUN: 14 mg/dL (ref 6–24)
CO2: 21 mmol/L (ref 20–29)
Calcium: 9.3 mg/dL (ref 8.7–10.2)
Chloride: 99 mmol/L (ref 96–106)
Creatinine, Ser: 0.85 mg/dL (ref 0.57–1.00)
GFR calc Af Amer: 91 mL/min/1.73
GFR calc non Af Amer: 79 mL/min/1.73
Glucose: 103 mg/dL — ABNORMAL HIGH (ref 65–99)
Potassium: 3.7 mmol/L (ref 3.5–5.2)
Sodium: 141 mmol/L (ref 134–144)

## 2018-11-06 LAB — HEMOGLOBIN A1C
Est. average glucose Bld gHb Est-mCnc: 117 mg/dL
Hgb A1c MFr Bld: 5.7 % — ABNORMAL HIGH (ref 4.8–5.6)

## 2018-11-11 ENCOUNTER — Telehealth: Payer: Self-pay | Admitting: *Deleted

## 2018-11-11 NOTE — Telephone Encounter (Signed)
Medical Assistant left message on patient's home and cell voicemail. Voicemail states to give a call back to [place] with [facility] at [phone]. MA contacted the patient via sign language services. Patient did not answer and a message was translated for her to return the phone call regarding results.

## 2018-11-11 NOTE — Telephone Encounter (Signed)
Patient verified DOB Patient is aware of labs being elevated in prediabetes and cholesterol Patient is advised to implement regular exercise and to limit bread rice and pasta intake.

## 2018-12-08 ENCOUNTER — Other Ambulatory Visit: Payer: Self-pay | Admitting: Family Medicine

## 2018-12-08 DIAGNOSIS — Z1231 Encounter for screening mammogram for malignant neoplasm of breast: Secondary | ICD-10-CM

## 2019-02-09 ENCOUNTER — Ambulatory Visit: Payer: Medicaid Other

## 2019-03-25 ENCOUNTER — Other Ambulatory Visit: Payer: Self-pay

## 2019-03-25 ENCOUNTER — Ambulatory Visit
Admission: RE | Admit: 2019-03-25 | Discharge: 2019-03-25 | Disposition: A | Discharge status: Home / Self Care | Payer: Medicaid Other | Source: Ambulatory Visit | Attending: Family Medicine | Admitting: Family Medicine

## 2019-03-25 DIAGNOSIS — Z1231 Encounter for screening mammogram for malignant neoplasm of breast: Secondary | ICD-10-CM

## 2019-06-16 ENCOUNTER — Other Ambulatory Visit: Payer: Self-pay

## 2019-06-16 ENCOUNTER — Ambulatory Visit: Payer: Medicaid Other | Attending: Family Medicine | Admitting: Physician Assistant

## 2019-06-16 VITALS — BP 142/89 | HR 71 | Ht 64.0 in | Wt 189.6 lb

## 2019-06-16 DIAGNOSIS — B351 Tinea unguium: Secondary | ICD-10-CM

## 2019-06-16 DIAGNOSIS — G4700 Insomnia, unspecified: Secondary | ICD-10-CM

## 2019-06-16 DIAGNOSIS — K2960 Other gastritis without bleeding: Secondary | ICD-10-CM

## 2019-06-16 DIAGNOSIS — R5383 Other fatigue: Secondary | ICD-10-CM

## 2019-06-16 DIAGNOSIS — R635 Abnormal weight gain: Secondary | ICD-10-CM

## 2019-06-16 DIAGNOSIS — R7303 Prediabetes: Secondary | ICD-10-CM

## 2019-06-16 DIAGNOSIS — Z23 Encounter for immunization: Secondary | ICD-10-CM | POA: Diagnosis not present

## 2019-06-16 DIAGNOSIS — I10 Essential (primary) hypertension: Secondary | ICD-10-CM

## 2019-06-16 MED ORDER — TERBINAFINE HCL 250 MG PO TABS: 250.0000 mg | ORAL_TABLET | Freq: Every day | ORAL | 0 refills | Status: DC

## 2019-06-16 MED ORDER — PANTOPRAZOLE SODIUM 40 MG PO TBEC: 40.0000 mg | DELAYED_RELEASE_TABLET | Freq: Every day | ORAL | 3 refills | Status: DC

## 2019-06-16 MED ORDER — LISINOPRIL-HYDROCHLOROTHIAZIDE 20-25 MG PO TABS: 1.0000 | ORAL_TABLET | Freq: Every day | ORAL | 3 refills | Status: DC

## 2019-06-16 MED ORDER — TRAZODONE HCL 50 MG PO TABS: 25.0000 mg | ORAL_TABLET | Freq: Every evening | ORAL | 3 refills | Status: DC | PRN

## 2019-06-16 MED ORDER — AMLODIPINE BESYLATE 10 MG PO TABS: 10.0000 mg | ORAL_TABLET | Freq: Every day | ORAL | 1 refills | Status: DC

## 2019-06-16 NOTE — Patient Instructions (Signed)
[facility] Diet/eating plan    Fungal Nail Infection A fungal nail infection is a common infection of the toenails or fingernails. This condition affects toenails more often than fingernails. It often affects the great, or big, toes. More than one nail may be infected. The condition can be passed from person to person (is contagious). What are the causes? This condition is caused by a fungus. Several types of fungi can cause the infection. These fungi are common in moist and warm areas. If your hands or feet come into contact with the fungus, it may get into a crack in your fingernail or toenail and cause the infection. What increases the risk? The following factors may make you more likely to develop this condition:  Being female.  Being of older age.  Living with someone who has the fungus.  Walking barefoot in areas where the fungus thrives, such as showers or locker rooms.  Wearing shoes and socks that cause your feet to sweat.  Having a nail injury or a recent nail surgery.  Having certain medical conditions, such as: ? Athlete's foot. ? Diabetes. ? Psoriasis. ? Poor circulation. ? A weak body defense system (immune system). What are the signs or symptoms? Symptoms of this condition include:  A pale spot on the nail.  Thickening of the nail.  A nail that becomes yellow or brown.  A brittle or ragged nail edge.  A crumbling nail.  A nail that has lifted away from the nail bed. How is this diagnosed? This condition is diagnosed with a physical exam. Your health care provider may take a scraping or clipping from your nail to test for the fungus. How is this treated? Treatment is not needed for mild infections. If you have significant nail changes, treatment may include:  Antifungal medicines taken by mouth (orally). You may need to take the medicine for several weeks or several months, and you may not see the results for a long time. These medicines can cause side  effects. Ask your health care provider what problems to watch for.  Antifungal nail polish or nail cream. These may be used along with oral antifungal medicines.  Laser treatment of the nail.  Surgery to remove the nail. This may be needed for the most severe infections. It can take a long time, usually up to a year, for the infection to go away. The infection may also come back. Follow these instructions at home: Medicines  Take or apply over-the-counter and prescription medicines only as told by your health care provider.  Ask your health care provider about using over-the-counter mentholated ointment on your nails. Nail care  Trim your nails often.  Wash and dry your hands and feet every day.  Keep your feet dry: ? Wear absorbent socks, and change your socks frequently. ? Wear shoes that allow air to circulate, such as sandals or canvas tennis shoes. Throw out old shoes.  Do not use artificial nails.  If you go to a nail salon, make sure you choose one that uses clean instruments.  Use antifungal foot powder on your feet and in your shoes. General instructions  Do not share personal items, such as towels or nail clippers.  Do not walk barefoot in shower rooms or locker rooms.  Wear rubber gloves if you are working with your hands in wet areas.  Keep all follow-up visits as told by your health care provider. This is important. Contact a health care provider if: Your infection is not  getting better or it is getting worse after several months. Summary  A fungal nail infection is a common infection of the toenails or fingernails.  Treatment is not needed for mild infections. If you have significant nail changes, treatment may include taking medicine orally and applying medicine to your nails.  It can take a long time, usually up to a year, for the infection to go away. The infection may also come back.  Take or apply over-the-counter and prescription medicines only as  told by your health care provider.  Follow instructions for taking care of your nails to help prevent infection from coming back or spreading. This information is not intended to replace advice given to you by your health care provider. Make sure you discuss any questions you have with your health care provider. Document Released: 08/01/2000 Document Revised: 11/25/2018 Document Reviewed: 01/08/2018 Elsevier Patient Education  2020 [employer].

## 2019-06-16 NOTE — Progress Notes (Signed)
Patient ID: Caitlin [name], female   DOB: July 18, 1966, 53 y.o.   MRN: [id number]   Caitlin [name], is a 53 y.o. female  GOT:[id number]  DHR:[id number]  DOB - [birth year]  Subjective:  Chief Complaint and HPI: Caitlin [name] is a 53 y.o. female here today with ASL interpreter with multiple concerns:  -thickened toenails R foot for years.  NKI.  LFT fine previously.  -poor sleep-occurring for months.  Goes to sleep ok but doesn't stay asleep.  LMP ~2016.    Fatigue-esp last several months.  Made worse by quarantine.  Low energy levels.  Also concerned with weight gain due to lack of energy.  She eats a lot of salads.  She admits to also eating. Sweets.   ROS:   Constitutional:  No f/c, No night sweats, No unexplained weight loss. EENT:  No vision changes, No blurry vision, No hearing changes. No mouth, throat, or ear problems.  Respiratory: No cough, No SOB Cardiac: No CP, no palpitations GI:  No abd pain, No N/V/D. GU: No Urinary s/sx Musculoskeletal: No joint pain Neuro: No headache, no dizziness, no motor weakness.  Skin: No rash Endocrine:  No polydipsia. No polyuria.  Psych: Denies SI/HI  No problems updated.  ALLERGIES: Allergies  Allergen Reactions  . Eggs Or Egg-Derived Products Nausea And Vomiting  . Milk-Related Compounds     PAST MEDICAL HISTORY: Past Medical History:  Diagnosis Date  . Deaf   . Hypertension 2011  . NO (nasal obstruction)     MEDICATIONS AT HOME: Prior to Admission medications   Medication Sig Start Date End Date Taking? Authorizing Provider  loratadine (CLARITIN) 10 MG tablet Take 1 tablet (10 mg total) by mouth daily. As needed for allergy symptoms/congestion 11/05/18  Yes Fulp, Cammie, MD  amLODipine (NORVASC) 10 MG tablet Take 1 tablet (10 mg total) by mouth daily. To lower blood pressure 06/16/19  Yes Tramya Schoenfelder M, PA-C  lisinopril-hydrochlorothiazide (ZESTORETIC) 20-25 MG tablet Take 1 tablet by mouth daily. To lower blood pressure  06/16/19  Yes Beaux Wedemeyer M, PA-C  pantoprazole (PROTONIX) 40 MG tablet Take 1 tablet (40 mg total) by mouth daily before supper. To reduce stomach acid 06/16/19  Yes Vesna Kable M, PA-C  terbinafine (LAMISIL) 250 MG tablet Take 1 tablet (250 mg total) by mouth daily. 06/16/19   Anders Simmonds, PA-C  traZODone (DESYREL) 50 MG tablet Take 0.5-1 tablets (25-50 mg total) by mouth at bedtime as needed for sleep. 06/16/19   Anders Simmonds, PA-C     Objective:  EXAM:   Vitals:   06/16/19 0930  BP: (!) 142/89  Pulse: 71  SpO2: 96%  Weight: 189 lb 9.6 oz (86 kg)  Height: 5\' 4"  (1.626 m)    General appearance : A&OX3. NAD. Non-toxic-appearing HEENT: Atraumatic and Normocephalic.  PERRLA. EOM intact.  Neck: supple, no JVD. No cervical lymphadenopathy. No thyromegaly Chest/Lungs:  Breathing-non-labored, Good air entry bilaterally, breath sounds normal without rales, rhonchi, or wheezing  CVS: S1 S2 regular, no murmurs, gallops, rubs  Extremities: Bilateral Lower Ext shows no edema, both legs are warm to touch with = pulse throughout Neurology:  CN II-XII grossly intact, Non focal.   Psych:  TP linear. J/I WNL. Normal speech. Appropriate eye contact and affect.  Skin:  No Rash  Data Review Lab Results  Component Value Date   HGBA1C 5.7 (H) 11/05/2018   HGBA1C 5.7 (A) 06/15/2018   HGBA1C 5.7 06/15/2018   HGBA1C 5.7 06/15/2018   HGBA1C  5.7 06/15/2018     Assessment & Plan   1. Other fatigue Counseled on self-care, proper diet and exercise - Comprehensive metabolic panel - CBC with Differential/Platelet - Vitamin D, 25-hydroxy - Vitamin B12 - Folate - Thyroid Panel With TSH  2. Insomnia, unspecified type Sleep hygiene discussed at length. - Vitamin D, 25-hydroxy - Thyroid Panel With TSH - traZODone (DESYREL) 50 MG tablet; Take 0.5-1 tablets (25-50 mg total) by mouth at bedtime as needed for sleep.  Dispense: 30 tablet; Refill: 3  3. Weight gain Exercise,  dietary changes, advised [employer] eating plan - Comprehensive metabolic panel - Vitamin D, 25-hydroxy - Thyroid Panel With TSH  4. Onychomycosis Last LFT ~ 1year ago reviewed and WNL. - terbinafine (LAMISIL) 250 MG tablet; Take 1 tablet (250 mg total) by mouth daily.  Dispense: 90 tablet; Refill: 0  5. Essential hypertension Controlled-continue current regimen - Comprehensive metabolic panel - amLODipine (NORVASC) 10 MG tablet; Take 1 tablet (10 mg total) by mouth daily. To lower blood pressure  Dispense: 90 tablet; Refill: 1 - lisinopril-hydrochlorothiazide (ZESTORETIC) 20-25 MG tablet; Take 1 tablet by mouth daily. To lower blood pressure  Dispense: 90 tablet; Refill: 3  6. Pre-diabetes I have had a lengthy discussion and provided education about insulin resistance and the intake of too much sugar/refined carbohydrates.  I have advised the patient to work at a goal of eliminating sugary drinks, candy, desserts, sweets, refined sugars, processed foods, and white carbohydrates.  The patient expresses understanding.   - Comprehensive metabolic panel - Hemoglobin A1c - CBC with Differential/Platelet  7. Reflux gastritis - pantoprazole (PROTONIX) 40 MG tablet; Take 1 tablet (40 mg total) by mouth daily before supper. To reduce stomach acid  Dispense: 90 tablet; Refill: 3  8. 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   - Flu Vaccine QUAD 6+ mos PF IM (Fluarix Quad PF)  Spent >35 mins face to face counseling on all of the above issues.     Patient have been counseled extensively about nutrition and exercise  Return in about 6 months (around 12/15/2019) for pcp-chronic conditions.  The patient was given clear instructions to go to ER or return to medical center if symptoms don't improve, worsen or new problems develop. The patient verbalized understanding. The patient was told to call to get lab results if they haven't  heard anything in the next week.     Freeman Caldron, PA-C [facility] and [facility] [place], [place]   06/16/2019, 10:06 AM

## 2019-06-16 NOTE — Progress Notes (Signed)
Weight gain  Fatigue  Right toes hurt and skin on right toes peeling

## 2019-06-17 ENCOUNTER — Other Ambulatory Visit: Payer: Self-pay | Admitting: Physician Assistant

## 2019-06-17 DIAGNOSIS — E559 Vitamin D deficiency, unspecified: Secondary | ICD-10-CM

## 2019-06-17 LAB — COMPREHENSIVE METABOLIC PANEL
ALT: 17 IU/L (ref 0–32)
AST: 16 IU/L (ref 0–40)
Albumin/Globulin Ratio: 1.4 (ref 1.2–2.2)
Albumin: 4.5 g/dL (ref 3.8–4.9)
Alkaline Phosphatase: 80 IU/L (ref 39–117)
BUN/Creatinine Ratio: 26 — ABNORMAL HIGH (ref 9–23)
BUN: 24 mg/dL (ref 6–24)
Bilirubin Total: 0.4 mg/dL (ref 0.0–1.2)
CO2: 25 mmol/L (ref 20–29)
Calcium: 9.8 mg/dL (ref 8.7–10.2)
Chloride: 100 mmol/L (ref 96–106)
Creatinine, Ser: 0.92 mg/dL (ref 0.57–1.00)
GFR calc Af Amer: 83 mL/min/{1.73_m2} (ref >59)
GFR calc non Af Amer: 72 mL/min/{1.73_m2} (ref >59)
Globulin, Total: 3.2 g/dL (ref 1.5–4.5)
Glucose: 105 mg/dL — ABNORMAL HIGH (ref 65–99)
Potassium: 4.3 mmol/L (ref 3.5–5.2)
Sodium: 139 mmol/L (ref 134–144)
Total Protein: 7.7 g/dL (ref 6.0–8.5)

## 2019-06-17 LAB — CBC WITH DIFFERENTIAL/PLATELET
Basophils Absolute: 0 10*3/uL (ref 0.0–0.2)
Basos: 0 %
EOS (ABSOLUTE): 0.1 10*3/uL (ref 0.0–0.4)
Eos: 1 %
Hematocrit: 37 % (ref 34.0–46.6)
Hemoglobin: 12.4 g/dL (ref 11.1–15.9)
Immature Grans (Abs): 0 10*3/uL (ref 0.0–0.1)
Immature Granulocytes: 0 %
Lymphocytes Absolute: 2 10*3/uL (ref 0.7–3.1)
Lymphs: 40 %
MCH: 29.2 pg (ref 26.6–33.0)
MCHC: 33.5 g/dL (ref 31.5–35.7)
MCV: 87 fL (ref 79–97)
Monocytes Absolute: 0.5 10*3/uL (ref 0.1–0.9)
Monocytes: 9 %
Neutrophils Absolute: 2.5 10*3/uL (ref 1.4–7.0)
Neutrophils: 50 %
Platelets: 386 10*3/uL (ref 150–450)
RBC: 4.24 x10E6/uL (ref 3.77–5.28)
RDW: 12.3 % (ref 11.7–15.4)
WBC: 5.1 10*3/uL (ref 3.4–10.8)

## 2019-06-17 LAB — THYROID PANEL WITH TSH
Free Thyroxine Index: 2.4 (ref 1.2–4.9)
T3 Uptake Ratio: 30 % (ref 24–39)
T4, Total: 8.1 ug/dL (ref 4.5–12.0)
TSH: 2.57 u[IU]/mL (ref 0.450–4.500)

## 2019-06-17 LAB — VITAMIN B12: Vitamin B-12: 643 pg/mL (ref 232–1245)

## 2019-06-17 LAB — HEMOGLOBIN A1C
Est. average glucose Bld gHb Est-mCnc: 111 mg/dL
Hgb A1c MFr Bld: 5.5 % (ref 4.8–5.6)

## 2019-06-17 LAB — VITAMIN D 25 HYDROXY (VIT D DEFICIENCY, FRACTURES): Vit D, 25-Hydroxy: 19.4 ng/mL — ABNORMAL LOW (ref 30.0–100.0)

## 2019-06-17 LAB — FOLATE: Folate: 4.8 ng/mL (ref >3.0)

## 2019-06-17 MED ORDER — VITAMIN D (ERGOCALCIFEROL) 1.25 MG (50000 UNIT) PO CAPS: 50000.0000 [IU] | ORAL_CAPSULE | ORAL | 0 refills | Status: DC

## 2019-06-20 ENCOUNTER — Telehealth: Payer: Self-pay | Admitting: Family Medicine

## 2019-06-21 NOTE — Telephone Encounter (Signed)
Pt called for lab results, nurse agreed to call her back

## 2019-10-27 ENCOUNTER — Encounter: Payer: Self-pay | Admitting: Family Medicine

## 2019-10-27 ENCOUNTER — Telehealth: Payer: Self-pay

## 2019-10-27 ENCOUNTER — Ambulatory Visit: Payer: Medicaid Other | Attending: Family Medicine | Admitting: Family Medicine

## 2019-10-27 ENCOUNTER — Other Ambulatory Visit: Payer: Self-pay

## 2019-10-27 VITALS — BP 178/82 | HR 78 | Temp 97.5°F | Resp 16 | Wt 198.0 lb

## 2019-10-27 DIAGNOSIS — Z6830 Body mass index (BMI) 30.0-30.9, adult: Secondary | ICD-10-CM | POA: Insufficient documentation

## 2019-10-27 DIAGNOSIS — E559 Vitamin D deficiency, unspecified: Secondary | ICD-10-CM

## 2019-10-27 DIAGNOSIS — R350 Frequency of micturition: Secondary | ICD-10-CM | POA: Diagnosis not present

## 2019-10-27 DIAGNOSIS — H9193 Unspecified hearing loss, bilateral: Secondary | ICD-10-CM

## 2019-10-27 DIAGNOSIS — Z79899 Other long term (current) drug therapy: Secondary | ICD-10-CM | POA: Diagnosis not present

## 2019-10-27 DIAGNOSIS — G4700 Insomnia, unspecified: Secondary | ICD-10-CM

## 2019-10-27 DIAGNOSIS — E669 Obesity, unspecified: Secondary | ICD-10-CM

## 2019-10-27 DIAGNOSIS — Z8249 Family history of ischemic heart disease and other diseases of the circulatory system: Secondary | ICD-10-CM | POA: Diagnosis not present

## 2019-10-27 DIAGNOSIS — K219 Gastro-esophageal reflux disease without esophagitis: Secondary | ICD-10-CM | POA: Diagnosis not present

## 2019-10-27 DIAGNOSIS — R7303 Prediabetes: Secondary | ICD-10-CM | POA: Diagnosis not present

## 2019-10-27 DIAGNOSIS — I10 Essential (primary) hypertension: Secondary | ICD-10-CM | POA: Diagnosis present

## 2019-10-27 DIAGNOSIS — B351 Tinea unguium: Secondary | ICD-10-CM

## 2019-10-27 DIAGNOSIS — N951 Menopausal and female climacteric states: Secondary | ICD-10-CM | POA: Diagnosis not present

## 2019-10-27 DIAGNOSIS — Z87898 Personal history of other specified conditions: Secondary | ICD-10-CM | POA: Diagnosis not present

## 2019-10-27 DIAGNOSIS — E66811 Obesity, class 1: Secondary | ICD-10-CM

## 2019-10-27 DIAGNOSIS — K2960 Other gastritis without bleeding: Secondary | ICD-10-CM

## 2019-10-27 LAB — POCT GLYCOSYLATED HEMOGLOBIN (HGB A1C): Hemoglobin A1C: 5.5 % (ref 4.0–5.6)

## 2019-10-27 LAB — POCT URINALYSIS DIP (CLINITEK)
Bilirubin, UA: NEGATIVE
Glucose, UA: NEGATIVE mg/dL
Ketones, POC UA: NEGATIVE mg/dL
Leukocytes, UA: NEGATIVE
Nitrite, UA: NEGATIVE
POC PROTEIN,UA: NEGATIVE
Spec Grav, UA: 1.025
Urobilinogen, UA: 0.2 U/dL
pH, UA: 6

## 2019-10-27 LAB — GLUCOSE, POCT (MANUAL RESULT ENTRY): POC Glucose: 143 mg/dL — AB (ref 70–99)

## 2019-10-27 MED ORDER — AMLODIPINE BESYLATE 10 MG PO TABS: 10.0000 mg | ORAL_TABLET | Freq: Every day | ORAL | 1 refills | Status: DC

## 2019-10-27 MED ORDER — BLOOD PRESSURE MONITOR KIT: PACK | 0 refills | Status: DC

## 2019-10-27 MED ORDER — VALSARTAN-HYDROCHLOROTHIAZIDE 160-25 MG PO TABS: 1.0000 | ORAL_TABLET | Freq: Every day | ORAL | 5 refills | Status: DC

## 2019-10-27 MED ORDER — BLOOD PRESSURE MONITOR KIT: PACK | 0 refills | Status: AC

## 2019-10-27 MED ORDER — PANTOPRAZOLE SODIUM 40 MG PO TBEC: 40.0000 mg | DELAYED_RELEASE_TABLET | Freq: Every day | ORAL | 3 refills | Status: DC

## 2019-10-27 MED ORDER — TRAZODONE HCL 50 MG PO TABS: 25.0000 mg | ORAL_TABLET | Freq: Every evening | ORAL | 3 refills | Status: DC | PRN

## 2019-10-27 MED ORDER — VITAMIN D (ERGOCALCIFEROL) 1.25 MG (50000 UNIT) PO CAPS: 50000.0000 [IU] | ORAL_CAPSULE | ORAL | 0 refills | Status: DC

## 2019-10-27 NOTE — Progress Notes (Signed)
Subjective:  Patient ID: Caitlin [name], female    DOB: [birth year]  Age: 54 y.o. MRN: [id number]  CC: No chief complaint on file.   HPI Caitlin [name] and [place], 54 year old female who had last telemedicine visit here in the office on 06/16/2019 with another provider due to complaints of fatigue for which she had blood work including: comprehensive metabolic panel, CBC, vitamin D level, vitamin B12, folate and thyroid panel insomnia for which she was prescribed trazodone, complaint of weight gain for which exercise and dietary changes and [employer] plan were advised, complaint of onychomycosis for which she was prescribed Lamisil, hypertension for which she was to continue amlodipine 10 mg and lisinopril-hydrochlorothiazide 20-25 mg as well as patient with prediabetes for which hemoglobin A1c was ordered, refill of pantoprazole for gastric reflux and patient is being seen today for continued follow-up and medical management of her chronic issues. At her last visit, hemoglobin A1c was within normal at 5.5.  Patient did have low vitamin D level of 19.4.  Prescription was sent to patient's pharmacy for vitamin D replacement therapy.         At today's visit, she reports that her level of fatigue and achiness have while she was taking the vitamin D replacement therapy but she has completed this.  She wonders if she needs additional prescription vitamin D.  She also reports that she is taking her blood pressure medications, amlodipine and lisinopril hydrochlorothiazide.  She has not checked her blood pressure at home or anywhere else.  She does have occasional, mild headaches.  Headaches are generalized and dull when they occur.  She denies dizziness and no episodes of focal numbness or weakness.  She has had no chest pain or palpitations.  She continues to take her pantoprazole for treatment of acid reflux.  She has had no blood in the stool and no black stools.  She did take Lamisil for treatment  of toenail fungus but states that she would forget to take the medication every day and is not currently on the medication.       She denies any issues with increased thirst or blurred vision related to her history of prediabetes.  She has noticed an increase in urinary frequency.  She denies dysuria.  She has tried increasing her water intake and she is not sure if this is why she is urinating more frequently.  She reports that she would like to have medication to help with weight loss and she cannot seem to lose weight on her own.  She also reports issues with insomnia.  She has trouble falling asleep and staying asleep but does not want to take anything that would keep her too groggy.  She reports that she does have some increased stress as she is currently also taking care of her grandchildren.  She denies any depression, no suicidal thoughts or ideations  Past Medical History:  Diagnosis Date  . Deaf   . Hypertension 2011  . NO (nasal obstruction)     Past Surgical History:  Procedure Laterality Date  . CESAREAN SECTION    . CHOLECYSTECTOMY    . TUBAL LIGATION      Family History  Problem Relation Age of Onset  . Heart disease Mother   . Stroke Mother     Social History   Tobacco Use  . Smoking status: Never Smoker  . Smokeless tobacco: Never Used  Substance Use Topics  . Alcohol use: Yes    Comment:  occassionally     ROS Review of Systems  Constitutional: Positive for fatigue. Negative for chills and fever.  HENT: Positive for hearing loss and sore throat. Negative for trouble swallowing.   Eyes: Negative for photophobia and visual disturbance.  Respiratory: Negative for cough and shortness of breath.   Cardiovascular: Negative for chest pain, palpitations and leg swelling.  Gastrointestinal: Negative for abdominal pain, blood in stool, constipation, diarrhea and nausea.  Endocrine: Positive for polyuria. Negative for polydipsia and polyphagia.  Genitourinary: Positive  for frequency. Negative for dysuria.  Musculoskeletal: Negative for arthralgias and back pain.  Skin: Negative for rash and wound.  Neurological: Positive for headaches. Negative for dizziness.  Hematological: Negative for adenopathy.  Psychiatric/Behavioral: Positive for sleep disturbance. Negative for self-injury and suicidal ideas. The patient is nervous/anxious.     Objective:   Today's Vitals: BP (!) 180/90 (BP Location: Right Arm, Patient Position: Sitting, Cuff Size: Large)   Pulse 78   Temp (!) 97.5 F (36.4 C)   Resp 16   Wt 198 lb (89.8 kg)   LMP 05/08/2016   SpO2 98%   BMI 33.99 kg/m   Physical Exam Vitals and nursing note reviewed.  Constitutional:      Appearance: Normal appearance. She is obese.     Comments: WNWD overweight older female in NAD wearing a mask as per office  COVID protocol. She is accompanied by a sign language interpreter  Neck:     Vascular: No carotid bruit.  Cardiovascular:     Rate and Rhythm: Normal rate and regular rhythm.  Pulmonary:     Effort: Pulmonary effort is normal.     Breath sounds: Normal breath sounds.  Abdominal:     Palpations: Abdomen is soft.     Tenderness: There is no abdominal tenderness. There is no right CVA tenderness, left CVA tenderness, guarding or rebound.  Musculoskeletal:        General: No tenderness.     Cervical back: Normal range of motion and neck supple. No tenderness.     Right lower leg: No edema.     Left lower leg: No edema.  Lymphadenopathy:     Cervical: No cervical adenopathy.  Skin:    General: Skin is warm and dry.  Neurological:     General: No focal deficit present.     Mental Status: She is alert and oriented to person, place, and time.  Psychiatric:        Mood and Affect: Mood normal.        Behavior: Behavior normal.     Assessment & Plan:  1. Essential hypertension Blood pressure elevated at today's visit.  Patient reports that she has been compliant with her current blood  pressure medications.  She will continue amlodipine 10 mg and she is to take an additional lisinopril 20-25 when she gets home from today's visit and then pick up prescription for valsartan-hydrochlorothiazide 160 mg-25 milligrams to start tomorrow along with continuation of amlodipine.  Order will be placed to see if a home blood pressure monitor can be obtained for the patient.  She will return to clinic in 1 to 2 weeks for blood pressure recheck with the clinical pharmacist, sooner if she is having any problems or concerns.  Low-sodium/DASH diet encouraged.  She is also being referred for weight loss program which should also help lower the blood pressure.  She will have recheck of electrolytes as part of comprehensive metabolic panel and urinalysis at today's visit. - POCT  URINALYSIS DIP (CLINITEK) - Comprehensive metabolic panel; Future - amLODipine (NORVASC) 10 MG tablet; Take 1 tablet (10 mg total) by mouth daily. To lower blood pressure  Dispense: 90 tablet; Refill: 1 - valsartan-hydrochlorothiazide (DIOVAN HCT) 160-25 MG tablet; Take 1 tablet by mouth daily. To lower blood pressure  Dispense: 30 tablet; Refill: 5 - Comprehensive metabolic panel - Blood Pressure Monitor KIT; Use to check blood pressure daily  Dispense: 1 kit; Refill: 0  2. Vitamin D deficiency We will recheck vitamin D level in follow-up of prior vitamin D deficiency and she will be notified if prescription versus over-the-counter vitamin D therapy is needed based on the results.  She did notice improvement in level of fatigue while on prescription vitamin D therapy. - Vitamin D, Ergocalciferol, (DRISDOL) 1.25 MG (50000 UNIT) CAPS capsule; Take 1 capsule (50,000 Units total) by mouth every 7 (seven) days.  Dispense: 16 capsule; Refill: 0 - Vitamin D, 25-hydroxy  3. Gastroesophageal reflux disease, unspecified whether esophagitis present Continue use of pantoprazole as well as avoidance of late night eating and avoidance of  known trigger foods.  Avoid or limit nonsteroidal anti-inflammatory medications which can increase risk for stomach irritation and ulcers.  Pantoprazole refill provided.  4. History of prediabetes Hemoglobin A1c on 11/05/2018 was 5.7 consistent with prediabetes but subsequent hemoglobin A1c results have been 5.5.  She will have repeat glucose and hemoglobin A1c at today's visit.  Low carbohydrate diet, regular exercise and weight loss encouraged. - Glucose (CBG) - HgB A1c  5. Urinary frequency She does complain of urinary frequency and will check urinalysis to look for urinary tract infection as well to perform urine culture.  She will also have hemoglobin A1c and glucose level at today's visit to look for elevated blood sugar as a contributing factor to her urinary frequency as she also has had prior history of prediabetes. - POCT URINALYSIS DIP (CLINITEK) - Urine Culture  6. Onychomycosis Patient has previously been treated for onychomycosis but has not had repeat of LFTs status post antifungal therapy.  Will check LFTs as part of comprehensive metabolic panel at today's visit. - Comprehensive metabolic panel  7. Obesity (BMI 30.0-34.9) Patient is interested in weight loss and will be referred to medical weight management.  She will have comprehensive metabolic panel at today's visit as obesity can increase the risk of diabetes, fatty liver and other medical conditions.  Weight loss should also help improve patient's blood pressure control. - Amb Ref to Medical Weight Management - Comprehensive metabolic panel  8. Menopausal symptoms She has been encouraged to try over-the-counter Estroven or black cohosh to help with her menopausal symptoms  9. Encounter for long-term current use of medication She will have a comprehensive metabolic panel in follow-up of long-term use of medications for the treatment of hypertension and recent use of medication for treatment of onychomycosis -  Comprehensive metabolic panel  10. Insomnia, unspecified type Prescription provided for trazodone to help with patient's issues with insomnia - traZODone (DESYREL) 50 MG tablet; Take 0.5-1 tablets (25-50 mg total) by mouth at bedtime as needed for sleep.  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  Language barrier Patient was accompanied by an in person sign language interpreter to help with language barrier as patient is deaf  Outpatient Encounter Medications as of 10/27/2019  Medication Sig  . amLODipine (NORVASC) 10 MG tablet Take 1 tablet (10 mg total) by mouth daily. To lower blood pressure  . lisinopril-hydrochlorothiazide (ZESTORETIC) 20-25 MG tablet  Take 1 tablet by mouth daily. To lower blood pressure  . loratadine (CLARITIN) 10 MG tablet Take 1 tablet (10 mg total) by mouth daily. As needed for allergy symptoms/congestion  . pantoprazole (PROTONIX) 40 MG tablet Take 1 tablet (40 mg total) by mouth daily before supper. To reduce stomach acid  . terbinafine (LAMISIL) 250 MG tablet Take 1 tablet (250 mg total) by mouth daily.  . traZODone (DESYREL) 50 MG tablet Take 0.5-1 tablets (25-50 mg total) by mouth at bedtime as needed for sleep.  . Vitamin D, Ergocalciferol, (DRISDOL) 1.25 MG (50000 UT) CAPS capsule Take 1 capsule (50,000 Units total) by mouth every 7 (seven) days.   No facility-administered encounter medications on file as of 10/27/2019.    An After Visit Summary was printed and given to the patient.   Follow-up: Return in about 8 weeks (around 12/22/2019) for HTN: 1-2 weeks with CPP.    Antony Blackbird MD

## 2019-10-27 NOTE — Telephone Encounter (Signed)
Call received from the patient. Inquired if she has a preference for supplier for order for BP monitor.  She requested the order be sent to [facility].  The sign language interpreter assisted with the call.   Order faxed as requested.

## 2019-10-27 NOTE — Patient Instructions (Addendum)
When you get home today, please take an additional dose of your lisinopril-hctz. You will be prescribed another medication in place of your lisinopril-hctz. The new medication is valsartan-hctz 160-25 mg once daily that you can start tomorrow and you will continue the use of amlodipine 10 mg daily.   You can try Estroven or [employer] which are over the counter to help with hot flashes/menopause symptoms   Hypertension, Adult Hypertension is another name for high blood pressure. High blood pressure forces your heart to work harder to pump blood. This can cause problems over time. There are two numbers in a blood pressure reading. There is a top number (systolic) over a bottom number (diastolic). It is best to have a blood pressure that is below 120/80. Healthy choices can help lower your blood pressure, or you may need medicine to help lower it. What are the causes? The cause of this condition is not known. Some conditions may be related to high blood pressure. What increases the risk?  Smoking.  Having type 2 diabetes mellitus, high cholesterol, or both.  Not getting enough exercise or physical activity.  Being overweight.  Having too much fat, sugar, calories, or salt (sodium) in your diet.  Drinking too much alcohol.  Having long-term (chronic) kidney disease.  Having a family history of high blood pressure.  Age. Risk increases with age.  Race. You may be at higher risk if you are African American.  Gender. Men are at higher risk than women before age 73. After age 53, women are at higher risk than men.  Having obstructive sleep apnea.  Stress. What are the signs or symptoms?  High blood pressure may not cause symptoms. Very high blood pressure (hypertensive crisis) may cause: ? Headache. ? Feelings of worry or nervousness (anxiety). ? Shortness of breath. ? Nosebleed. ? A feeling of being sick to your stomach (nausea). ? Throwing up (vomiting). ? Changes in how  you see. ? Very bad chest pain. ? Seizures. How is this treated?  This condition is treated by making healthy lifestyle changes, such as: ? Eating healthy foods. ? Exercising more. ? Drinking less alcohol.  Your health care provider may prescribe medicine if lifestyle changes are not enough to get your blood pressure under control, and if: ? Your top number is above 130. ? Your bottom number is above 80.  Your personal target blood pressure may vary. Follow these instructions at home: Eating and drinking   If told, follow the DASH eating plan. To follow this plan: ? Fill one half of your plate at each meal with fruits and vegetables. ? Fill one fourth of your plate at each meal with whole grains. Whole grains include whole-wheat pasta, brown rice, and whole-grain bread. ? Eat or drink low-fat dairy products, such as skim milk or low-fat yogurt. ? Fill one fourth of your plate at each meal with low-fat (lean) proteins. Low-fat proteins include fish, chicken without skin, eggs, beans, and tofu. ? Avoid fatty meat, cured and processed meat, or chicken with skin. ? Avoid pre-made or processed food.  Eat less than 1,500 mg of salt each day.  Do not drink alcohol if: ? Your doctor tells you not to drink. ? You are pregnant, may be pregnant, or are planning to become pregnant.  If you drink alcohol: ? Limit how much you use to:  0-1 drink a day for women.  0-2 drinks a day for men. ? Be aware of how much alcohol is  in your drink. In the [place] one drink equals one 12 oz bottle of beer (355 mL), one 5 oz glass of wine (148 mL), or one 1 oz glass of hard liquor (44 mL). Lifestyle   Work with your doctor to stay at a healthy weight or to lose weight. Ask your doctor what the best weight is for you.  Get at least 30 minutes of exercise most days of the week. This may include walking, swimming, or biking.  Get at least 30 minutes of exercise that strengthens your muscles  (resistance exercise) at least 3 days a week. This may include lifting weights or doing Pilates.  Do not use any products that contain nicotine or tobacco, such as cigarettes, e-cigarettes, and chewing tobacco. If you need help quitting, ask your doctor.  Check your blood pressure at home as told by your doctor.  Keep all follow-up visits as told by your doctor. This is important. Medicines  Take over-the-counter and prescription medicines only as told by your doctor. Follow directions carefully.  Do not skip doses of blood pressure medicine. The medicine does not work as well if you skip doses. Skipping doses also puts you at risk for problems.  Ask your doctor about side effects or reactions to medicines that you should watch for. Contact a doctor if you:  Think you are having a reaction to the medicine you are taking.  Have headaches that keep coming back (recurring).  Feel dizzy.  Have swelling in your ankles.  Have trouble with your vision. Get help right away if you:  Get a very bad headache.  Start to feel mixed up (confused).  Feel weak or numb.  Feel faint.  Have very bad pain in your: ? Chest. ? Belly (abdomen).  Throw up more than once.  Have trouble breathing. Summary  Hypertension is another name for high blood pressure.  High blood pressure forces your heart to work harder to pump blood.  For most people, a normal blood pressure is less than 120/80.  Making healthy choices can help lower blood pressure. If your blood pressure does not get lower with healthy choices, you may need to take medicine. This information is not intended to replace advice given to you by your health care provider. Make sure you discuss any questions you have with your health care provider. Document Revised: 04/14/2018 Document Reviewed: 04/14/2018 Elsevier Patient Education  2020 [employer].  Menopause Menopause is the normal time of life when menstrual periods stop  completely. It is usually confirmed by 12 months without a menstrual period. The transition to menopause (perimenopause) most often happens between the ages of 26 and 24. During perimenopause, hormone levels change in your body, which can cause symptoms and affect your health. Menopause may increase your risk for:  Loss of bone (osteoporosis), which causes bone breaks (fractures).  Depression.  Hardening and narrowing of the arteries (atherosclerosis), which can cause heart attacks and strokes. What are the causes? This condition is usually caused by a natural change in hormone levels that happens as you get older. The condition may also be caused by surgery to remove both ovaries (bilateral oophorectomy). What increases the risk? This condition is more likely to start at an earlier age if you have certain medical conditions or treatments, including:  A tumor of the pituitary gland in the brain.  A disease that affects the ovaries and hormone production.  Radiation treatment for cancer.  Certain cancer treatments, such as chemotherapy or hormone (  anti-estrogen) therapy.  Heavy smoking and excessive alcohol use.  Family history of early menopause. This condition is also more likely to develop earlier in women who are very thin. What are the signs or symptoms? Symptoms of this condition include:  Hot flashes.  Irregular menstrual periods.  Night sweats.  Changes in feelings about sex. This could be a decrease in sex drive or an increased comfort around your sexuality.  Vaginal dryness and thinning of the vaginal walls. This may cause painful intercourse.  Dryness of the skin and development of wrinkles.  Headaches.  Problems sleeping (insomnia).  Mood swings or irritability.  Memory problems.  Weight gain.  Hair growth on the face and chest.  Bladder infections or problems with urinating. How is this diagnosed? This condition is diagnosed based on your medical  history, a physical exam, your age, your menstrual history, and your symptoms. Hormone tests may also be done. How is this treated? In some cases, no treatment is needed. You and your health care provider should make a decision together about whether treatment is necessary. Treatment will be based on your individual condition and preferences. Treatment for this condition focuses on managing symptoms. Treatment may include:  Menopausal hormone therapy (MHT).  Medicines to treat specific symptoms or complications.  Acupuncture.  Vitamin or herbal supplements. Before starting treatment, make sure to let your health care provider know if you have a personal or family history of:  Heart disease.  Breast cancer.  Blood clots.  Diabetes.  Osteoporosis. Follow these instructions at home: Lifestyle  Do not use any products that contain nicotine or tobacco, such as cigarettes and e-cigarettes. If you need help quitting, ask your health care provider.  Get at least 30 minutes of physical activity on 5 or more days each week.  Avoid alcoholic and caffeinated beverages, as well as spicy foods. This may help prevent hot flashes.  Get 7-8 hours of sleep each night.  If you have hot flashes, try: ? Dressing in layers. ? Avoiding things that may trigger hot flashes, such as spicy food, warm places, or stress. ? Taking slow, deep breaths when a hot flash starts. ? Keeping a fan in your home and office.  Find ways to manage stress, such as deep breathing, meditation, or journaling.  Consider going to group therapy with other women who are having menopause symptoms. Ask your health care provider about recommended group therapy meetings. Eating and drinking  Eat a healthy, balanced diet that contains whole grains, lean protein, low-fat dairy, and plenty of fruits and vegetables.  Your health care provider may recommend adding more soy to your diet. Foods that contain soy include tofu,  tempeh, and soy milk.  Eat plenty of foods that contain calcium and vitamin D for bone health. Items that are rich in calcium include low-fat milk, yogurt, beans, almonds, sardines, broccoli, and kale. Medicines  Take over-the-counter and prescription medicines only as told by your health care provider.  Talk with your health care provider before starting any herbal supplements. If prescribed, take vitamins and supplements as told by your health care provider. These may include: ? Calcium. Women age 74 and older should get 1,200 mg (milligrams) of calcium every day. ? Vitamin D. Women need 600-800 International Units of vitamin D each day. ? Vitamins B12 and B6. Aim for 50 micrograms of B12 and 1.5 mg of B6 each day. General instructions  Keep track of your menstrual periods, including: ? When they occur. ? How heavy  they are and how long they last. ? How much time passes between periods.  Keep track of your symptoms, noting when they start, how often you have them, and how long they last.  Use vaginal lubricants or moisturizers to help with vaginal dryness and improve comfort during sex.  Keep all follow-up visits as told by your health care provider. This is important. This includes any group therapy or counseling. Contact a health care provider if:  You are still having menstrual periods after age 51.  You have pain during sex.  You have not had a period for 12 months and you develop vaginal bleeding. Get help right away if:  You have: ? Severe depression. ? Excessive vaginal bleeding. ? Pain when you urinate. ? A fast or irregular heart beat (palpitations). ? Severe headaches. ? Abdomen (abdominal) pain or severe indigestion.  You fell and you think you have a broken bone.  You develop leg or chest pain.  You develop vision problems.  You feel a lump in your breast. Summary  Menopause is the normal time of life when menstrual periods stop completely. It is usually  confirmed by 12 months without a menstrual period.  The transition to menopause (perimenopause) most often happens between the ages of 88 and 84.  Symptoms can be managed through medicines, lifestyle changes, and complementary therapies such as acupuncture.  Eat a balanced diet that is rich in nutrients to promote bone health and heart health and to manage symptoms during menopause. This information is not intended to replace advice given to you by your health care provider. Make sure you discuss any questions you have with your health care provider. Document Revised: 07/17/2017 Document Reviewed: 09/06/2016 Elsevier Patient Education  2020 [employer].

## 2019-10-27 NOTE — Telephone Encounter (Signed)
Order received for home BP monitor. Call placed to the patient # [phone] to inquire if she has a preference for supplier. Message left through [employer] - sign language interpreter # [phone] with call back requested to this CM .

## 2019-10-27 NOTE — Progress Notes (Signed)
Allergies medication and hot flashes Dry eyes  Would like weight loss medication Occasional headaches  Stressful situations at home but feels safe home

## 2019-10-28 LAB — COMPREHENSIVE METABOLIC PANEL WITH GFR
ALT: 18 IU/L (ref 0–32)
AST: 20 IU/L (ref 0–40)
Albumin/Globulin Ratio: 1.4 (ref 1.2–2.2)
Albumin: 4.4 g/dL (ref 3.8–4.9)
Alkaline Phosphatase: 92 IU/L (ref 39–117)
BUN/Creatinine Ratio: 17 (ref 9–23)
BUN: 17 mg/dL (ref 6–24)
Bilirubin Total: 0.3 mg/dL (ref 0.0–1.2)
CO2: 27 mmol/L (ref 20–29)
Calcium: 10.1 mg/dL (ref 8.7–10.2)
Chloride: 104 mmol/L (ref 96–106)
Creatinine, Ser: 0.98 mg/dL (ref 0.57–1.00)
GFR calc Af Amer: 76 mL/min/1.73
GFR calc non Af Amer: 66 mL/min/1.73
Globulin, Total: 3.1 g/dL (ref 1.5–4.5)
Glucose: 97 mg/dL (ref 65–99)
Potassium: 4.1 mmol/L (ref 3.5–5.2)
Sodium: 144 mmol/L (ref 134–144)
Total Protein: 7.5 g/dL (ref 6.0–8.5)

## 2019-10-28 LAB — VITAMIN D 25 HYDROXY (VIT D DEFICIENCY, FRACTURES): Vit D, 25-Hydroxy: 26.7 ng/mL — ABNORMAL LOW (ref 30.0–100.0)

## 2019-10-29 LAB — URINE CULTURE: Organism ID, Bacteria: NO GROWTH

## 2019-10-31 ENCOUNTER — Telehealth: Payer: Self-pay | Admitting: Family Medicine

## 2019-10-31 NOTE — Telephone Encounter (Signed)
Patient called and requested for lab results with interpreter Tresa Endo [phone]. Patient was identified with 2 identifiers. Labs were given, patient verbalized understanding and had no further questions of concerns at this time.

## 2019-11-03 ENCOUNTER — Other Ambulatory Visit: Payer: Self-pay

## 2019-11-03 ENCOUNTER — Ambulatory Visit: Payer: Medicaid Other | Attending: Family Medicine | Admitting: Pharmacist

## 2019-11-03 VITALS — BP 142/78

## 2019-11-03 DIAGNOSIS — Z8249 Family history of ischemic heart disease and other diseases of the circulatory system: Secondary | ICD-10-CM | POA: Diagnosis not present

## 2019-11-03 DIAGNOSIS — I10 Essential (primary) hypertension: Secondary | ICD-10-CM

## 2019-11-03 DIAGNOSIS — Z79899 Other long term (current) drug therapy: Secondary | ICD-10-CM | POA: Diagnosis not present

## 2019-11-03 DIAGNOSIS — Z823 Family history of stroke: Secondary | ICD-10-CM | POA: Diagnosis not present

## 2019-11-03 NOTE — Progress Notes (Signed)
   S:    PCP: Dr. Jillyn Hidden  Patient arrives in good spirits. Presents to the clinic for hypertension evaluation, counseling, and management.  Patient was referred and last seen by Primary Care Provider on 10/27/19.    Patient reports adherence with medications.  Current BP Medications include:  Amlodipine 10 mg daily, Diovan-HCT 160-25 mg dail  Dietary habits include: not fully compliant with salt restriction; denies drinking caffeine   Exercise habits include: just started Saturday Family / Social history:  - FHx: heart disease, stroke  - Never smoker - Denies drinking alcohol  O:  Vitals:   11/03/19 1033  BP: (!) 142/78   Home BP readings: none   Last 3 Office BP readings: BP Readings from Last 3 Encounters:  11/03/19 (!) 142/78  10/27/19 (!) 178/82  06/16/19 (!) 142/89   BMET    Component Value Date/Time   NA 144 10/27/2019 1029   NA 140 05/28/2012 1937   K 4.1 10/27/2019 1029   K 3.1 (L) 05/28/2012 1937   CL 104 10/27/2019 1029   CL 103 05/28/2012 1937   CO2 27 10/27/2019 1029   CO2 26 05/28/2012 1937   GLUCOSE 97 10/27/2019 1029   GLUCOSE 118 (H) 06/10/2016 0307   GLUCOSE 98 05/28/2012 1937   BUN 17 10/27/2019 1029   BUN 15 05/28/2012 1937   CREATININE 0.98 10/27/2019 1029   CREATININE 0.81 09/28/2015 1200   CALCIUM 10.1 10/27/2019 1029   CALCIUM 8.7 05/28/2012 1937   GFRNONAA 66 10/27/2019 1029   GFRNONAA 86 09/28/2015 1200   GFRAA 76 10/27/2019 1029   GFRAA >89 09/28/2015 1200    Renal function: Estimated Creatinine Clearance: 72 mL/min (by C-G formula based on SCr of 0.98 mg/dL).  Clinical ASCVD: No  The 10-year ASCVD risk score Denman George DC Jr., et al., 2013) is: 10.3%   Values used to calculate the score:     Age: 54 years     Sex: Female     Is Non-Hispanic African American: Yes     Diabetic: No     Tobacco smoker: No     Systolic Blood Pressure: 142 mmHg     Is BP treated: Yes     HDL Cholesterol: 35 mg/dL     Total Cholesterol: 200  mg/dL   A/P: Hypertension longstanding currently uncontrolled but improved on current medications. BP Goal = <130/80 mmHg. Patient reports medication adherence. Will hold off on any changes given her improvement and f/u in 1 month. -Continued current regimen.  -Counseled on lifestyle modifications for blood pressure control including reduced dietary sodium, increased exercise, adequate sleep  Results reviewed and written information provided.   Total time in face-to-face counseling 20 minutes.   F/U Clinic Visit in 1 month.   Butch Penny, PharmD, CPP Clinical Pharmacist [facility] [phone]

## 2019-11-03 NOTE — Patient Instructions (Signed)
DASH Eating Plan DASH stands for "Dietary Approaches to Stop Hypertension." The DASH eating plan is a healthy eating plan that has been shown to reduce high blood pressure (hypertension). It may also reduce your risk for type 2 diabetes, heart disease, and stroke. The DASH eating plan may also help with weight loss. What are tips for following this plan?  General guidelines  Avoid eating more than 2,300 mg (milligrams) of salt (sodium) a day. If you have hypertension, you may need to reduce your sodium intake to 1,500 mg a day.  Limit alcohol intake to no more than 1 drink a day for nonpregnant women and 2 drinks a day for men. One drink equals 12 oz of beer, 5 oz of wine, or 1 oz of hard liquor.  Work with your health care provider to maintain a healthy body weight or to lose weight. Ask what an ideal weight is for you.  Get at least 30 minutes of exercise that causes your heart to beat faster (aerobic exercise) most days of the week. Activities may include walking, swimming, or biking.  Work with your health care provider or diet and nutrition specialist (dietitian) to adjust your eating plan to your individual calorie needs. Reading food labels   Check food labels for the amount of sodium per serving. Choose foods with less than 5 percent of the Daily Value of sodium. Generally, foods with less than 300 mg of sodium per serving fit into this eating plan.  To find whole grains, look for the word "whole" as the first word in the ingredient list. Shopping  Buy products labeled as "low-sodium" or "no salt added."  Buy fresh foods. Avoid canned foods and premade or frozen meals. Cooking  Avoid adding salt when cooking. Use salt-free seasonings or herbs instead of table salt or sea salt. Check with your health care provider or pharmacist before using salt substitutes.  Do not fry foods. Cook foods using healthy methods such as baking, boiling, grilling, and broiling instead.  Cook with  heart-healthy oils, such as olive, canola, soybean, or sunflower oil. Meal planning  Eat a balanced diet that includes: ? 5 or more servings of fruits and vegetables each day. At each meal, try to fill half of your plate with fruits and vegetables. ? Up to 6-8 servings of whole grains each day. ? Less than 6 oz of lean meat, poultry, or fish each day. A 3-oz serving of meat is about the same size as a deck of cards. One egg equals 1 oz. ? 2 servings of low-fat dairy each day. ? A serving of nuts, seeds, or beans 5 times each week. ? Heart-healthy fats. Healthy fats called Omega-3 fatty acids are found in foods such as flaxseeds and coldwater fish, like sardines, salmon, and mackerel.  Limit how much you eat of the following: ? Canned or prepackaged foods. ? Food that is high in trans fat, such as fried foods. ? Food that is high in saturated fat, such as fatty meat. ? Sweets, desserts, sugary drinks, and other foods with added sugar. ? Full-fat dairy products.  Do not salt foods before eating.  Try to eat at least 2 vegetarian meals each week.  Eat more home-cooked food and less restaurant, buffet, and fast food.  When eating at a restaurant, ask that your food be prepared with less salt or no salt, if possible. What foods are recommended? The items listed may not be a complete list. Talk with your dietitian about   what dietary choices are best for you. Grains Whole-grain or whole-wheat bread. Whole-grain or whole-wheat pasta. Brown rice. Oatmeal. Quinoa. Bulgur. Whole-grain and low-sodium cereals. Pita bread. Low-fat, low-sodium crackers. Whole-wheat flour tortillas. Vegetables Fresh or frozen vegetables (raw, steamed, roasted, or grilled). Low-sodium or reduced-sodium tomato and vegetable juice. Low-sodium or reduced-sodium tomato sauce and tomato paste. Low-sodium or reduced-sodium canned vegetables. Fruits All fresh, dried, or frozen fruit. Canned fruit in natural juice (without  added sugar). Meat and other protein foods Skinless chicken or turkey. Ground chicken or turkey. Pork with fat trimmed off. Fish and seafood. Egg whites. Dried beans, peas, or lentils. Unsalted nuts, nut butters, and seeds. Unsalted canned beans. Lean cuts of beef with fat trimmed off. Low-sodium, lean deli meat. Dairy Low-fat (1%) or fat-free (skim) milk. Fat-free, low-fat, or reduced-fat cheeses. Nonfat, low-sodium ricotta or cottage cheese. Low-fat or nonfat yogurt. Low-fat, low-sodium cheese. Fats and oils Soft margarine without trans fats. Vegetable oil. Low-fat, reduced-fat, or light mayonnaise and salad dressings (reduced-sodium). Canola, safflower, olive, soybean, and sunflower oils. Avocado. Seasoning and other foods Herbs. Spices. Seasoning mixes without salt. Unsalted popcorn and pretzels. Fat-free sweets. What foods are not recommended? The items listed may not be a complete list. Talk with your dietitian about what dietary choices are best for you. Grains Baked goods made with fat, such as croissants, muffins, or some breads. Dry pasta or rice meal packs. Vegetables Creamed or fried vegetables. Vegetables in a cheese sauce. Regular canned vegetables (not low-sodium or reduced-sodium). Regular canned tomato sauce and paste (not low-sodium or reduced-sodium). Regular tomato and vegetable juice (not low-sodium or reduced-sodium). Pickles. Olives. Fruits Canned fruit in a light or heavy syrup. Fried fruit. Fruit in cream or butter sauce. Meat and other protein foods Fatty cuts of meat. Ribs. Fried meat. Bacon. Sausage. Bologna and other processed lunch meats. Salami. Fatback. Hotdogs. Bratwurst. Salted nuts and seeds. Canned beans with added salt. Canned or smoked fish. Whole eggs or egg yolks. Chicken or turkey with skin. Dairy Whole or 2% milk, cream, and half-and-half. Whole or full-fat cream cheese. Whole-fat or sweetened yogurt. Full-fat cheese. Nondairy creamers. Whipped toppings.  Processed cheese and cheese spreads. Fats and oils Butter. Stick margarine. Lard. Shortening. Ghee. Bacon fat. Tropical oils, such as coconut, palm kernel, or palm oil. Seasoning and other foods Salted popcorn and pretzels. Onion salt, garlic salt, seasoned salt, table salt, and sea salt. Worcestershire sauce. Tartar sauce. Barbecue sauce. Teriyaki sauce. Soy sauce, including reduced-sodium. Steak sauce. Canned and packaged gravies. Fish sauce. Oyster sauce. Cocktail sauce. Horseradish that you find on the shelf. Ketchup. Mustard. Meat flavorings and tenderizers. Bouillon cubes. Hot sauce and Tabasco sauce. Premade or packaged marinades. Premade or packaged taco seasonings. Relishes. Regular salad dressings. Where to find more information:  National Heart, Lung, and Blood Institute: www.nhlbi.nih.gov  American Heart Association: www.heart.org Summary  The DASH eating plan is a healthy eating plan that has been shown to reduce high blood pressure (hypertension). It may also reduce your risk for type 2 diabetes, heart disease, and stroke.  With the DASH eating plan, you should limit salt (sodium) intake to 2,300 mg a day. If you have hypertension, you may need to reduce your sodium intake to 1,500 mg a day.  When on the DASH eating plan, aim to eat more fresh fruits and vegetables, whole grains, lean proteins, low-fat dairy, and heart-healthy fats.  Work with your health care provider or diet and nutrition specialist (dietitian) to adjust your eating plan to your   individual calorie needs. This information is not intended to replace advice given to you by your health care provider. Make sure you discuss any questions you have with your health care provider. Document Revised: 07/17/2017 Document Reviewed: 07/28/2016 Elsevier Patient Education  2020 Elsevier Inc.  

## 2019-11-04 ENCOUNTER — Encounter: Payer: Self-pay | Admitting: Pharmacist

## 2019-12-05 ENCOUNTER — Ambulatory Visit: Payer: Medicaid Other | Admitting: Pharmacist

## 2019-12-19 ENCOUNTER — Ambulatory Visit: Payer: Medicaid Other | Attending: Family Medicine | Admitting: Family Medicine

## 2019-12-19 ENCOUNTER — Encounter: Payer: Self-pay | Admitting: Family Medicine

## 2019-12-19 ENCOUNTER — Other Ambulatory Visit: Payer: Self-pay

## 2019-12-19 VITALS — BP 182/89 | HR 71 | Temp 97.9°F | Ht 64.0 in | Wt 198.6 lb

## 2019-12-19 DIAGNOSIS — Z79899 Other long term (current) drug therapy: Secondary | ICD-10-CM | POA: Diagnosis not present

## 2019-12-19 DIAGNOSIS — J309 Allergic rhinitis, unspecified: Secondary | ICD-10-CM | POA: Diagnosis not present

## 2019-12-19 DIAGNOSIS — Z7901 Long term (current) use of anticoagulants: Secondary | ICD-10-CM | POA: Insufficient documentation

## 2019-12-19 DIAGNOSIS — Z8249 Family history of ischemic heart disease and other diseases of the circulatory system: Secondary | ICD-10-CM | POA: Insufficient documentation

## 2019-12-19 DIAGNOSIS — G479 Sleep disorder, unspecified: Secondary | ICD-10-CM

## 2019-12-19 DIAGNOSIS — R5383 Other fatigue: Secondary | ICD-10-CM

## 2019-12-19 DIAGNOSIS — R519 Headache, unspecified: Secondary | ICD-10-CM | POA: Diagnosis not present

## 2019-12-19 DIAGNOSIS — H5789 Other specified disorders of eye and adnexa: Secondary | ICD-10-CM | POA: Diagnosis not present

## 2019-12-19 DIAGNOSIS — L309 Dermatitis, unspecified: Secondary | ICD-10-CM

## 2019-12-19 DIAGNOSIS — H9193 Unspecified hearing loss, bilateral: Secondary | ICD-10-CM | POA: Diagnosis not present

## 2019-12-19 DIAGNOSIS — I10 Essential (primary) hypertension: Secondary | ICD-10-CM | POA: Diagnosis present

## 2019-12-19 DIAGNOSIS — R0602 Shortness of breath: Secondary | ICD-10-CM

## 2019-12-19 DIAGNOSIS — J4521 Mild intermittent asthma with (acute) exacerbation: Secondary | ICD-10-CM

## 2019-12-19 MED ORDER — ALBUTEROL SULFATE HFA 108 (90 BASE) MCG/ACT IN AERS: 2.0000 | INHALATION_SPRAY | Freq: Four times a day (QID) | RESPIRATORY_TRACT | 3 refills | Status: DC | PRN

## 2019-12-19 MED ORDER — VALSARTAN-HYDROCHLOROTHIAZIDE 160-25 MG PO TABS: 1.0000 | ORAL_TABLET | Freq: Every day | ORAL | 1 refills | Status: DC

## 2019-12-19 MED ORDER — FLUTICASONE PROPIONATE 50 MCG/ACT NA SUSP: 2.0000 | Freq: Every day | NASAL | 6 refills | Status: DC

## 2019-12-19 MED ORDER — LORATADINE 10 MG PO TABS: 10.0000 mg | ORAL_TABLET | Freq: Every day | ORAL | 3 refills | Status: DC

## 2019-12-19 MED ORDER — AMLODIPINE BESYLATE 10 MG PO TABS: 10.0000 mg | ORAL_TABLET | Freq: Every day | ORAL | 1 refills | Status: DC

## 2019-12-19 NOTE — Progress Notes (Signed)
Established Patient Office Visit  Subjective:  Patient ID: Caitlin [name], female    DOB: 1965-12-04  Age: 54 y.o. MRN: [id number]  CC: No chief complaint on file.  Due to communication barrier, patient was accompanied by a sign language interpreter at today's visit  HPI Caitlin [name], 54 year old female who initially scheduled appointment due to concern regarding darkening skin around her eyes, with history of hypertension and allergic rhinitis and congenital deafness who is seen at today's visit.  She reports that the pharmacy had difficulty obtaining her blood pressure medication, valsartan HCTZ therefore she was out of her blood pressure medication for about a week until she was able to restart the medication on Friday.  She reports that she has remained compliant otherwise with the use of her valsartan- HCTZ and amlodipine.  She reports that she takes the valsartan HCTZ in the mornings and the amlodipine at bedtime.  She denies any current issues with headaches or dizziness but she is surprised that her blood pressure is  elevated at today's visit. She has gotten an occasional headache type pain at the right back of the scalp/neck area.         Patient with audible wheezing while in exam room and patient was asked if she had any prior history of asthma, sensation of chest tightness or shortness of breath or any history of wheezing.  Patient reports that she has noticed a recent increase in shortness of breath especially with activity such as going up stairs.  She states that a daughter told her just yesterday that they can hear patient making noises while she is breathing.  Patient does not smoke.  Her daughter who lives with her does smoke but not has had the patient has a boyfriend who occasionally smokes inside of the home.  Patient reports some recent increase in nasal congestion and postnasal drainage and she has taken loratadine in the past but has not taken this medication in about  3 weeks.  She has had some occasional sneezing.  She has noticed a darkening of the skin around her eyes for the past few weeks.  Upon further questioning she has also had watery, itchy eyes.  She has tried other antihistamines in the past but these caused her to be too drowsy. She has noticed that she tends to stay tired.  Upon questioning, she has been told that she makes loud noises during sleep.  Past Medical History:  Diagnosis Date  . Deaf   . Hypertension 2011  . NO (nasal obstruction)     Past Surgical History:  Procedure Laterality Date  . CESAREAN SECTION    . CHOLECYSTECTOMY    . TUBAL LIGATION      Family History  Problem Relation Age of Onset  . Heart disease Mother   . Stroke Mother     Social History   Socioeconomic History  . Marital status: Married    Spouse name: Not on file  . Number of children: 3   . Years of education: Not on file  . Highest education level: Not on file  Occupational History    Employer: UNEMPLOYED  Tobacco Use  . Smoking status: Never Smoker  . Smokeless tobacco: Never Used  Substance and Sexual Activity  . Alcohol use: Yes    Comment: occassionally   . Drug use: No  . Sexual activity: Yes    Birth control/protection: Surgical  Other Topics Concern  . Not on file  Social History Narrative  .  Not on file   Social Determinants of Health   Financial Resource Strain:   . Difficulty of Paying Living Expenses:   Food Insecurity:   . Worried About Charity fundraiser in the Last Year:   . Arboriculturist in the Last Year:   Transportation Needs:   . Film/video editor (Medical):   Marland Kitchen Lack of Transportation (Non-Medical):   Physical Activity:   . Days of Exercise per Week:   . Minutes of Exercise per Session:   Stress:   . Feeling of Stress :   Social Connections:   . Frequency of Communication with Friends and Family:   . Frequency of Social Gatherings with Friends and Family:   . Attends Religious Services:   .  Active Member of Clubs or Organizations:   . Attends Archivist Meetings:   Marland Kitchen Marital Status:   Intimate Partner Violence:   . Fear of Current or Ex-Partner:   . Emotionally Abused:   Marland Kitchen Physically Abused:   . Sexually Abused:     Outpatient Medications Prior to Visit  Medication Sig Dispense Refill  . Blood Pressure Monitor KIT Use to check blood pressure daily 1 kit 0  . pantoprazole (PROTONIX) 40 MG tablet Take 1 tablet (40 mg total) by mouth daily before supper. To reduce stomach acid 90 tablet 3  . traZODone (DESYREL) 50 MG tablet Take 0.5-1 tablets (25-50 mg total) by mouth at bedtime as needed for sleep. 30 tablet 3  . Vitamin D, Ergocalciferol, (DRISDOL) 1.25 MG (50000 UNIT) CAPS capsule Take 1 capsule (50,000 Units total) by mouth every 7 (seven) days. 16 capsule 0  . amLODipine (NORVASC) 10 MG tablet Take 1 tablet (10 mg total) by mouth daily. To lower blood pressure 90 tablet 1  . loratadine (CLARITIN) 10 MG tablet Take 1 tablet (10 mg total) by mouth daily. As needed for allergy symptoms/congestion 90 tablet 3  . valsartan-hydrochlorothiazide (DIOVAN HCT) 160-25 MG tablet Take 1 tablet by mouth daily. To lower blood pressure 30 tablet 5  . terbinafine (LAMISIL) 250 MG tablet Take 1 tablet (250 mg total) by mouth daily. (Patient not taking: Reported on 10/27/2019) 90 tablet 0   No facility-administered medications prior to visit.    Allergies  Allergen Reactions  . Eggs Or Egg-Derived Products Nausea And Vomiting  . Milk-Related Compounds     ROS Review of Systems    Objective:    Physical Exam  Constitutional: She is oriented to person, place, and time. She appears well-developed and well-nourished.  Well-nourished well-developed slightly overweight for height female in no acute distress.  Patient with audible breathing noises/wheezing.  Patient is wearing a mask as per office COVID-19 precautions and she is also accompanied by a sign language interpreter.    HENT:  Nose: Mucosal edema and rhinorrhea present.  Nasal turbinates are light blue to purple in color and edematous.  Swelling of nasal mucosa obstructs nasal passages.  There is the presence of clear and white nasal drainage.  Patient has narrowed posterior airway and large tongue base.  Narrowed posterior airway.  Eyes: Conjunctivae and EOM are normal.  Neck: No JVD present.  Pulmonary/Chest: Effort normal and breath sounds normal.  Patient with auditory wheezing but lungs are clear to auscultation but decreased air movement in all lung fields.  No accessory muscle use and no increased work of breathing.  Abdominal: Soft. There is no abdominal tenderness. There is no rebound and no guarding.  Musculoskeletal:  General: No tenderness or edema.     Cervical back: Normal range of motion.  Lymphadenopathy:    She has no cervical adenopathy.  Neurological: She is alert and oriented to person, place, and time.  Skin: Skin is warm and dry. Rash noted.  Hyperpigmented/darkened skin in the eyelid area and area beneath the eyes that is slightly bumpy  Psychiatric: She has a normal mood and affect.  Nursing note and vitals reviewed.   BP (!) 182/89   Pulse 71   Temp 97.9 F (36.6 C) (Temporal)   Ht 5' 4" (1.626 m)   Wt 198 lb 9.6 oz (90.1 kg)   LMP 05/08/2016   SpO2 99%   BMI 34.09 kg/m  Wt Readings from Last 3 Encounters:  12/19/19 198 lb 9.6 oz (90.1 kg)  10/27/19 198 lb (89.8 kg)  06/16/19 189 lb 9.6 oz (86 kg)     Health Maintenance Due  Topic Date Due  . COVID-19 Vaccine (1) Never done  . PAP SMEAR-Modifier  05/01/2017    There are no preventive care reminders to display for this patient.  Lab Results  Component Value Date   TSH 2.570 06/16/2019   Lab Results  Component Value Date   WBC 5.1 06/16/2019   HGB 12.4 06/16/2019   HCT 37.0 06/16/2019   MCV 87 06/16/2019   PLT 386 06/16/2019   Lab Results  Component Value Date   NA 144 10/27/2019   K 4.1  10/27/2019   CO2 27 10/27/2019   GLUCOSE 97 10/27/2019   BUN 17 10/27/2019   CREATININE 0.98 10/27/2019   BILITOT 0.3 10/27/2019   ALKPHOS 92 10/27/2019   AST 20 10/27/2019   ALT 18 10/27/2019   PROT 7.5 10/27/2019   ALBUMIN 4.4 10/27/2019   CALCIUM 10.1 10/27/2019   ANIONGAP 8 06/10/2016   Lab Results  Component Value Date   CHOL 200 (H) 11/05/2018   Lab Results  Component Value Date   HDL 35 (L) 11/05/2018   Lab Results  Component Value Date   LDLCALC 146 (H) 11/05/2018   Lab Results  Component Value Date   TRIG 96 11/05/2018   Lab Results  Component Value Date   CHOLHDL 5.7 (H) 11/05/2018   Lab Results  Component Value Date   HGBA1C 5.5 10/27/2019      Assessment & Plan:  1. Essential hypertension She reports that she was out of her blood pressure medication for a few days.  New prescriptions provided for her valsartan hydrochlorothiazide and amlodipine 10 mg.  She has been asked to return to clinic later this week or next week for recheck of her blood pressure by the clinical pharmacist and blood pressure medication may need to be adjusted.  Continue low-sodium diet.  She is also being referred for sleep study as obstructive sleep apnea may be playing a role in her elevated blood pressure. - valsartan-hydrochlorothiazide (DIOVAN HCT) 160-25 MG tablet; Take 1 tablet by mouth daily. To lower blood pressure  Dispense: 90 tablet; Refill: 1 - amLODipine (NORVASC) 10 MG tablet; Take 1 tablet (10 mg total) by mouth daily. To lower blood pressure  Dispense: 90 tablet; Refill: 1 - Split night study; Future  2. Shortness of breath Due to her shortness of breath, she is being referred to an allergist.  She will also have CBC to look for anemia.  Prescription provided for albuterol inhaler to use as needed. - CBC with Differential - albuterol (VENTOLIN HFA) 108 (90 Base) MCG/ACT inhaler;  Inhale 2 puffs into the lungs every 6 (six) hours as needed for wheezing or shortness of  breath.  Dispense: 18 g; Refill: 3  3. Mild intermittent reactive airway disease with acute exacerbation Patient with complaint of recent increase in shortness of breath and has some wheezing and complaint of shortness of breath.  She has been prescribed albuterol to take as needed for her shortness of breath, any coughing or if she has been told that she is wheezing.  She is being referred for further evaluation to an allergist - Ambulatory referral to Allergy - albuterol (VENTOLIN HFA) 108 (90 Base) MCG/ACT inhaler; Inhale 2 puffs into the lungs every 6 (six) hours as needed for wheezing or shortness of breath.  Dispense: 18 g; Refill: 3  4. Eczema, unspecified type The darkening of the skin around patient's eyes appears to be related to atopic dermatitis/eczema.  Patient has been referred to allergist for further evaluation - Ambulatory referral to Allergy  5. Fatigue, unspecified type She has complaint of ongoing issues with fatigue.  She does have known vitamin D deficiency with recent vitamin D level done in March.  She had normal BMP in March as well.  She is being referred for sleep study to look for sleep apnea as a contributing factor to her fatigue and she will have CBC and TSH at today's visit. - CBC with Differential - T4 AND TSH - Split night study; Future  6. Allergic rhinitis, unspecified seasonality, unspecified trigger Refill provided of loratadine as patient states that other antihistamines caused her to feel drowsy.  She was also prescribed Flonase nasal spray.  She is being referred to an allergist in follow-up of her allergic rhinitis as well as possible reactive airway disease and she appears to have atopic dermatitis/eczema - Ambulatory referral to Allergy  7. Sleep disturbance She has been scheduled for a split-night sleep study due to snoring, fatigue, narrowed posterior airway and large tongue base along with hypertension. - Split night study; Future  8. Bilateral  deafness Patient cannot hear herself wheeze or snore.  She has been prescribed albuterol inhaler and told to use this when she has issues with coughing or shortness of breath or if someone tells her that she sounds as if she is wheezing.  She is also being scheduled for sleep study.   An After Visit Summary was printed and given to the patient.  Follow-up: Return in about 3 weeks (around 01/09/2020) for HTN- needs BP check later this week or next with [facility].  Patient was unable to have blood work done at today's visit and will need to have this done when she returns for follow-up  More than 40 minutes of face-to-face time spent with the patient at today's visit along with additional time for completion of today's visit note  Antony Blackbird, MD

## 2019-12-19 NOTE — Progress Notes (Signed)
Per pt she is getting dark spots and swelling under both eyes and they come off and on.

## 2019-12-19 NOTE — Patient Instructions (Addendum)

## 2019-12-22 ENCOUNTER — Ambulatory Visit: Payer: Medicaid Other | Attending: Family Medicine | Admitting: Pharmacist

## 2019-12-22 ENCOUNTER — Encounter: Payer: Self-pay | Admitting: Pharmacist

## 2019-12-22 ENCOUNTER — Other Ambulatory Visit: Payer: Self-pay

## 2019-12-22 VITALS — BP 152/82 | HR 69

## 2019-12-22 DIAGNOSIS — Z8249 Family history of ischemic heart disease and other diseases of the circulatory system: Secondary | ICD-10-CM | POA: Diagnosis not present

## 2019-12-22 DIAGNOSIS — Z79899 Other long term (current) drug therapy: Secondary | ICD-10-CM | POA: Diagnosis not present

## 2019-12-22 DIAGNOSIS — I10 Essential (primary) hypertension: Secondary | ICD-10-CM | POA: Insufficient documentation

## 2019-12-22 MED ORDER — VALSARTAN-HYDROCHLOROTHIAZIDE 320-25 MG PO TABS: 1.0000 | ORAL_TABLET | Freq: Every day | ORAL | 1 refills | Status: DC

## 2019-12-22 NOTE — Progress Notes (Signed)
   S:    PCP: Dr. Jillyn Hidden  Patient arrives in good spirits. Presents to the clinic for hypertension evaluation, counseling, and management.  Patient was referred and last seen by Primary Care Provider on 12/19/2019. BP was elevated at that visit but patient reported being without medication prior to that visit. No med changes were made - refills given.   Today, patient reports adherence with medications. Denies chest pain, dyspnea, HA or blurred vision. Denies LE edema.   Current BP Medications include:  Amlodipine 10 mg daily, Diovan-HCT 160-25 mg daily  Dietary habits include: not fully compliant with salt restriction; denies drinking caffeine   Exercise habits include: just started Saturday Family / Social history:  - FHx: heart disease, stroke  - Never smoker - Denies drinking alcohol  O:  Vitals:   12/22/19 1112  BP: (!) 152/82  Pulse: 69   Home BP readings: none   Last 3 Office BP readings: BP Readings from Last 3 Encounters:  12/22/19 (!) 152/82  12/19/19 (!) 182/89  11/03/19 (!) 142/78   BMET    Component Value Date/Time   NA 144 10/27/2019 1029   NA 140 05/28/2012 1937   K 4.1 10/27/2019 1029   K 3.1 (L) 05/28/2012 1937   CL 104 10/27/2019 1029   CL 103 05/28/2012 1937   CO2 27 10/27/2019 1029   CO2 26 05/28/2012 1937   GLUCOSE 97 10/27/2019 1029   GLUCOSE 118 (H) 06/10/2016 0307   GLUCOSE 98 05/28/2012 1937   BUN 17 10/27/2019 1029   BUN 15 05/28/2012 1937   CREATININE 0.98 10/27/2019 1029   CREATININE 0.81 09/28/2015 1200   CALCIUM 10.1 10/27/2019 1029   CALCIUM 8.7 05/28/2012 1937   GFRNONAA 66 10/27/2019 1029   GFRNONAA 86 09/28/2015 1200   GFRAA 76 10/27/2019 1029   GFRAA >89 09/28/2015 1200    Renal function: CrCl cannot be calculated (Patient's most recent lab result is older than the maximum 21 days allowed.).  Clinical ASCVD: No  The 10-year ASCVD risk score Denman George DC Jr., et al., 2013) is: 13.1%   Values used to calculate the score:      Age: 65 years     Sex: Female     Is Non-Hispanic African American: Yes     Diabetic: No     Tobacco smoker: No     Systolic Blood Pressure: 152 mmHg     Is BP treated: Yes     HDL Cholesterol: 35 mg/dL     Total Cholesterol: 200 mg/dL   A/P: Hypertension longstanding currently uncontrolled. BP Goal = <130/80 mmHg. Patient reports medication adherence.  -Increase Diovan-HCT dose to 320-25 mg daily.  -Continue amlodipine 10 mg daily.  -Counseled on lifestyle modifications for blood pressure control including reduced dietary sodium, increased exercise, adequate sleep  Results reviewed and written information provided.   Total time in face-to-face counseling 20 minutes.   F/U Clinic Visit in 2 weeks with PCP.   Butch Penny, PharmD, CPP Clinical Pharmacist [facility] [phone]

## 2020-01-05 ENCOUNTER — Ambulatory Visit: Payer: Medicaid Other | Attending: Family Medicine | Admitting: Family Medicine

## 2020-01-05 ENCOUNTER — Other Ambulatory Visit: Payer: Self-pay

## 2020-01-05 ENCOUNTER — Encounter: Payer: Self-pay | Admitting: Family Medicine

## 2020-01-05 VITALS — BP 143/83 | HR 69 | Temp 98.1°F | Ht 64.0 in | Wt 199.6 lb

## 2020-01-05 DIAGNOSIS — Z8249 Family history of ischemic heart disease and other diseases of the circulatory system: Secondary | ICD-10-CM | POA: Diagnosis not present

## 2020-01-05 DIAGNOSIS — K219 Gastro-esophageal reflux disease without esophagitis: Secondary | ICD-10-CM

## 2020-01-05 DIAGNOSIS — E669 Obesity, unspecified: Secondary | ICD-10-CM

## 2020-01-05 DIAGNOSIS — I10 Essential (primary) hypertension: Secondary | ICD-10-CM

## 2020-01-05 DIAGNOSIS — Z79899 Other long term (current) drug therapy: Secondary | ICD-10-CM | POA: Diagnosis not present

## 2020-01-05 DIAGNOSIS — Z6830 Body mass index (BMI) 30.0-30.9, adult: Secondary | ICD-10-CM | POA: Insufficient documentation

## 2020-01-05 DIAGNOSIS — R5383 Other fatigue: Secondary | ICD-10-CM | POA: Diagnosis not present

## 2020-01-05 DIAGNOSIS — E66811 Obesity, class 1: Secondary | ICD-10-CM

## 2020-01-05 DIAGNOSIS — R1013 Epigastric pain: Secondary | ICD-10-CM

## 2020-01-05 MED ORDER — OMEPRAZOLE 40 MG PO CPDR: 40.0000 mg | DELAYED_RELEASE_CAPSULE | Freq: Every day | ORAL | 0 refills | Status: DC

## 2020-01-05 NOTE — Progress Notes (Signed)
HTN

## 2020-01-05 NOTE — Patient Instructions (Signed)
Food Choices for Gastroesophageal Reflux Disease, Adult When you have gastroesophageal reflux disease (GERD), the foods you eat and your eating habits are very important. Choosing the right foods can help ease your discomfort. Think about working with a nutrition specialist (dietitian) to help you make good choices. What are tips for following this plan?  Meals  Choose healthy foods that are low in fat, such as fruits, vegetables, whole grains, low-fat dairy products, and lean meat, fish, and poultry.  Eat small meals often instead of 3 large meals a day. Eat your meals slowly, and in a place where you are relaxed. Avoid bending over or lying down until 2-3 hours after eating.  Avoid eating meals 2-3 hours before bed.  Avoid drinking a lot of liquid with meals.  Cook foods using methods other than frying. Bake, grill, or broil food instead.  Avoid or limit: ? Chocolate. ? Peppermint or spearmint. ? Alcohol. ? Pepper. ? Black and decaffeinated coffee. ? Black and decaffeinated tea. ? Bubbly (carbonated) soft drinks. ? Caffeinated energy drinks and soft drinks.  Limit high-fat foods such as: ? Fatty meat or fried foods. ? Whole milk, cream, butter, or ice cream. ? Nuts and nut butters. ? Pastries, donuts, and sweets made with butter or shortening.  Avoid foods that cause symptoms. These foods may be different for everyone. Common foods that cause symptoms include: ? Tomatoes. ? Oranges, lemons, and limes. ? Peppers. ? Spicy food. ? Onions and garlic. ? Vinegar. Lifestyle  Maintain a healthy weight. Ask your doctor what weight is healthy for you. If you need to lose weight, work with your doctor to do so safely.  Exercise for at least 30 minutes for 5 or more days each week, or as told by your doctor.  Wear loose-fitting clothes.  Do not smoke. If you need help quitting, ask your doctor.  Sleep with the head of your bed higher than your feet. Use a wedge under the  mattress or blocks under the bed frame to raise the head of the bed. Summary  When you have gastroesophageal reflux disease (GERD), food and lifestyle choices are very important in easing your symptoms.  Eat small meals often instead of 3 large meals a day. Eat your meals slowly, and in a place where you are relaxed.  Limit high-fat foods such as fatty meat or fried foods.  Avoid bending over or lying down until 2-3 hours after eating.  Avoid peppermint and spearmint, caffeine, alcohol, and chocolate. This information is not intended to replace advice given to you by your health care provider. Make sure you discuss any questions you have with your health care provider. Document Revised: 11/25/2018 Document Reviewed: 09/09/2016 Elsevier Patient Education  2020 [employer].  Hypertension, Adult Hypertension is another name for high blood pressure. High blood pressure forces your heart to work harder to pump blood. This can cause problems over time. There are two numbers in a blood pressure reading. There is a top number (systolic) over a bottom number (diastolic). It is best to have a blood pressure that is below 120/80. Healthy choices can help lower your blood pressure, or you may need medicine to help lower it. What are the causes? The cause of this condition is not known. Some conditions may be related to high blood pressure. What increases the risk?  Smoking.  Having type 2 diabetes mellitus, high cholesterol, or both.  Not getting enough exercise or physical activity.  Being overweight.  Having too much  fat, sugar, calories, or salt (sodium) in your diet.  Drinking too much alcohol.  Having long-term (chronic) kidney disease.  Having a family history of high blood pressure.  Age. Risk increases with age.  Race. You may be at higher risk if you are African American.  Gender. Men are at higher risk than women before age 54. After age 6, women are at higher risk than  men.  Having obstructive sleep apnea.  Stress. What are the signs or symptoms?  High blood pressure may not cause symptoms. Very high blood pressure (hypertensive crisis) may cause: ? Headache. ? Feelings of worry or nervousness (anxiety). ? Shortness of breath. ? Nosebleed. ? A feeling of being sick to your stomach (nausea). ? Throwing up (vomiting). ? Changes in how you see. ? Very bad chest pain. ? Seizures. How is this treated?  This condition is treated by making healthy lifestyle changes, such as: ? Eating healthy foods. ? Exercising more. ? Drinking less alcohol.  Your health care provider may prescribe medicine if lifestyle changes are not enough to get your blood pressure under control, and if: ? Your top number is above 130. ? Your bottom number is above 80.  Your personal target blood pressure may vary. Follow these instructions at home: Eating and drinking   If told, follow the DASH eating plan. To follow this plan: ? Fill one half of your plate at each meal with fruits and vegetables. ? Fill one fourth of your plate at each meal with whole grains. Whole grains include whole-wheat pasta, brown rice, and whole-grain bread. ? Eat or drink low-fat dairy products, such as skim milk or low-fat yogurt. ? Fill one fourth of your plate at each meal with low-fat (lean) proteins. Low-fat proteins include fish, chicken without skin, eggs, beans, and tofu. ? Avoid fatty meat, cured and processed meat, or chicken with skin. ? Avoid pre-made or processed food.  Eat less than 1,500 mg of salt each day.  Do not drink alcohol if: ? Your doctor tells you not to drink. ? You are pregnant, may be pregnant, or are planning to become pregnant.  If you drink alcohol: ? Limit how much you use to:  0-1 drink a day for women.  0-2 drinks a day for men. ? Be aware of how much alcohol is in your drink. In the [place] one drink equals one 12 oz bottle of beer (355 mL), one 5 oz  glass of wine (148 mL), or one 1 oz glass of hard liquor (44 mL). Lifestyle   Work with your doctor to stay at a healthy weight or to lose weight. Ask your doctor what the best weight is for you.  Get at least 30 minutes of exercise most days of the week. This may include walking, swimming, or biking.  Get at least 30 minutes of exercise that strengthens your muscles (resistance exercise) at least 3 days a week. This may include lifting weights or doing Pilates.  Do not use any products that contain nicotine or tobacco, such as cigarettes, e-cigarettes, and chewing tobacco. If you need help quitting, ask your doctor.  Check your blood pressure at home as told by your doctor.  Keep all follow-up visits as told by your doctor. This is important. Medicines  Take over-the-counter and prescription medicines only as told by your doctor. Follow directions carefully.  Do not skip doses of blood pressure medicine. The medicine does not work as well if you skip doses. Skipping doses  also puts you at risk for problems.  Ask your doctor about side effects or reactions to medicines that you should watch for. Contact a doctor if you:  Think you are having a reaction to the medicine you are taking.  Have headaches that keep coming back (recurring).  Feel dizzy.  Have swelling in your ankles.  Have trouble with your vision. Get help right away if you:  Get a very bad headache.  Start to feel mixed up (confused).  Feel weak or numb.  Feel faint.  Have very bad pain in your: ? Chest. ? Belly (abdomen).  Throw up more than once.  Have trouble breathing. Summary  Hypertension is another name for high blood pressure.  High blood pressure forces your heart to work harder to pump blood.  For most people, a normal blood pressure is less than 120/80.  Making healthy choices can help lower blood pressure. If your blood pressure does not get lower with healthy choices, you may need to  take medicine. This information is not intended to replace advice given to you by your health care provider. Make sure you discuss any questions you have with your health care provider. Document Revised: 04/14/2018 Document Reviewed: 04/14/2018 Elsevier Patient Education  2020 [employer].

## 2020-01-05 NOTE — Progress Notes (Signed)
Established Patient Office Visit  Subjective:  Patient ID: Caitlin [name], female    DOB: Aug 21, 1965  Age: 54 y.o. MRN: [id number]  CC: No chief complaint on file.  Patient is accompanied at today's visit by sign language interpreter.  HPI Caitlin [name] , 54 year old female, seen in follow-up of hypertension for which she was recently seen by the clinical pharmacist and patient's had continued elevation in blood pressure and her dose of valsartan HCTZ was increased to 320-25 in addition to continuation of amlodipine.  Patient states that she has been on the new medication dose for about 1-1/2 to 2 weeks.  She denies any headaches or dizziness.  She has also been trying to lose weight by walking but admits that she has only able to exercise about twice last week secondary to also taking care of her kids.          On review of systems, patient reports that she continues to have issues with acid reflux, particularly when she feels full after certain foods.  She states that she will have sensation of burping/belching as well as backwash of a bad tasting fluid into her mouth and throat.  She is currently on pantoprazole.  She also continues to have some fatigue.  She also has some mild shortness of breath but only with exertion/exercise.  She denies any chest pain or palpitations, no cough or wheezing, no diarrhea or constipation.  Past Medical History:  Diagnosis Date  . Deaf   . Hypertension 2011  . NO (nasal obstruction)     Past Surgical History:  Procedure Laterality Date  . CESAREAN SECTION    . CHOLECYSTECTOMY    . TUBAL LIGATION      Family History  Problem Relation Age of Onset  . Heart disease Mother   . Stroke Mother     Social History   Socioeconomic History  . Marital status: Married    Spouse name: Not on file  . Number of children: 3   . Years of education: Not on file  . Highest education level: Not on file  Occupational History    Employer: UNEMPLOYED   Tobacco Use  . Smoking status: Never Smoker  . Smokeless tobacco: Never Used  Substance and Sexual Activity  . Alcohol use: Yes    Comment: occassionally   . Drug use: No  . Sexual activity: Yes    Birth control/protection: Surgical  Other Topics Concern  . Not on file  Social History Narrative  . Not on file   Social Determinants of Health   Financial Resource Strain:   . Difficulty of Paying Living Expenses:   Food Insecurity:   . Worried About Charity fundraiser in the Last Year:   . Arboriculturist in the Last Year:   Transportation Needs:   . Film/video editor (Medical):   Marland Kitchen Lack of Transportation (Non-Medical):   Physical Activity:   . Days of Exercise per Week:   . Minutes of Exercise per Session:   Stress:   . Feeling of Stress :   Social Connections:   . Frequency of Communication with Friends and Family:   . Frequency of Social Gatherings with Friends and Family:   . Attends Religious Services:   . Active Member of Clubs or Organizations:   . Attends Archivist Meetings:   Marland Kitchen Marital Status:   Intimate Partner Violence:   . Fear of Current or Ex-Partner:   . Emotionally  Abused:   Marland Kitchen Physically Abused:   . Sexually Abused:     Outpatient Medications Prior to Visit  Medication Sig Dispense Refill  . albuterol (VENTOLIN HFA) 108 (90 Base) MCG/ACT inhaler Inhale 2 puffs into the lungs every 6 (six) hours as needed for wheezing or shortness of breath. 18 g 3  . amLODipine (NORVASC) 10 MG tablet Take 1 tablet (10 mg total) by mouth daily. To lower blood pressure 90 tablet 1  . Blood Pressure Monitor KIT Use to check blood pressure daily 1 kit 0  . fluticasone (FLONASE) 50 MCG/ACT nasal spray Place 2 sprays into both nostrils daily. 16 g 6  . loratadine (CLARITIN) 10 MG tablet Take 1 tablet (10 mg total) by mouth daily. As needed for allergy symptoms/congestion 90 tablet 3  . pantoprazole (PROTONIX) 40 MG tablet Take 1 tablet (40 mg total) by mouth  daily before supper. To reduce stomach acid 90 tablet 3  . terbinafine (LAMISIL) 250 MG tablet Take 1 tablet (250 mg total) by mouth daily. 90 tablet 0  . traZODone (DESYREL) 50 MG tablet Take 0.5-1 tablets (25-50 mg total) by mouth at bedtime as needed for sleep. 30 tablet 3  . valsartan-hydrochlorothiazide (DIOVAN-HCT) 320-25 MG tablet Take 1 tablet by mouth daily. 90 tablet 1  . Vitamin D, Ergocalciferol, (DRISDOL) 1.25 MG (50000 UNIT) CAPS capsule Take 1 capsule (50,000 Units total) by mouth every 7 (seven) days. 16 capsule 0   No facility-administered medications prior to visit.    Allergies  Allergen Reactions  . Eggs Or Egg-Derived Products Nausea And Vomiting  . Milk-Related Compounds     ROS Review of Systems  Constitutional: Positive for fatigue. Negative for chills and fever.  HENT: Negative for sore throat and trouble swallowing.   Eyes: Negative for photophobia and visual disturbance.  Respiratory: Negative for cough and shortness of breath.   Cardiovascular: Negative for chest pain, palpitations and leg swelling.  Gastrointestinal: Negative for abdominal pain, constipation, diarrhea and nausea.  Endocrine: Negative for cold intolerance, heat intolerance, polydipsia, polyphagia and polyuria.  Genitourinary: Negative for dysuria and frequency.  Musculoskeletal: Negative for arthralgias and back pain.  Neurological: Negative for dizziness and headaches.  Hematological: Negative for adenopathy. Does not bruise/bleed easily.  Psychiatric/Behavioral: Negative for self-injury and suicidal ideas.      Objective:    Physical Exam  Constitutional: She is oriented to person, place, and time. She appears well-developed and well-nourished.  WNWD overweight for height female in NAD sitting on exam table wearing a mask as per office COVID-19 protocol. She is accompanied by a sign language interpreter at today's visit.   Neck: No JVD present.  Cardiovascular: Normal rate and  regular rhythm.  Pulmonary/Chest: Effort normal and breath sounds normal.  Abdominal: Soft. There is abdominal tenderness (epigastric). There is no rebound and no guarding.  Musculoskeletal:        General: No tenderness or edema.     Cervical back: Normal range of motion and neck supple.  Lymphadenopathy:    She has no cervical adenopathy.  Neurological: She is alert and oriented to person, place, and time.  Skin: Skin is warm and dry. No rash noted.  Psychiatric: She has a normal mood and affect. Her behavior is normal.  Nursing note and vitals reviewed.   BP (!) 157/90   Pulse 69   Temp 98.1 F (36.7 C) (Temporal)   Ht 5' 4" (1.626 m)   Wt 199 lb 9.6 oz (90.5 kg)  LMP 05/08/2016   SpO2 100%   BMI 34.26 kg/m  Wt Readings from Last 3 Encounters:  01/05/20 199 lb 9.6 oz (90.5 kg)  12/19/19 198 lb 9.6 oz (90.1 kg)  10/27/19 198 lb (89.8 kg)     Health Maintenance Due  Topic Date Due  . COVID-19 Vaccine (1) Never done  . PAP SMEAR-Modifier  05/01/2017     Lab Results  Component Value Date   TSH 2.570 06/16/2019   Lab Results  Component Value Date   WBC 5.1 06/16/2019   HGB 12.4 06/16/2019   HCT 37.0 06/16/2019   MCV 87 06/16/2019   PLT 386 06/16/2019   Lab Results  Component Value Date   NA 144 10/27/2019   K 4.1 10/27/2019   CO2 27 10/27/2019   GLUCOSE 97 10/27/2019   BUN 17 10/27/2019   CREATININE 0.98 10/27/2019   BILITOT 0.3 10/27/2019   ALKPHOS 92 10/27/2019   AST 20 10/27/2019   ALT 18 10/27/2019   PROT 7.5 10/27/2019   ALBUMIN 4.4 10/27/2019   CALCIUM 10.1 10/27/2019   ANIONGAP 8 06/10/2016   Lab Results  Component Value Date   CHOL 200 (H) 11/05/2018   Lab Results  Component Value Date   HDL 35 (L) 11/05/2018   Lab Results  Component Value Date   LDLCALC 146 (H) 11/05/2018   Lab Results  Component Value Date   TRIG 96 11/05/2018   Lab Results  Component Value Date   CHOLHDL 5.7 (H) 11/05/2018   Lab Results  Component Value  Date   HGBA1C 5.5 10/27/2019      Assessment & Plan:  1. Essential hypertension Repeat blood pressure was closer to goal of 140/80 or less as blood pressure was 143/83.  She is being referred to medical weight management to help of weight loss and in the interim, she is to continue a low-sodium diet and regular exercise/activity with goal of weight loss.  Continue current valsartan hydrochlorothiazide at 320-25 as she has only recently increase the dose of this medication after consultation with the clinical pharmacist.  She will have upcoming appointment with the clinical pharmacist for blood pressure recheck in the next 3 to 4 weeks. - Amb Ref to Medical Weight Management  2. Gastroesophageal reflux disease, unspecified whether esophagitis present; 5.  Epigastric pain Patient with complaint of acid reflux symptoms.  She is being prescribed omeprazole at 40 mg twice daily and should avoid the use of known trigger foods as well as avoidance of late night eating/eating within 2 hours of bedtime.  We will also check antibodies for H pylori to see if H. pylori infection may be contributing to her symptoms. - omeprazole (PRILOSEC) 40 MG capsule; Take 1 capsule (40 mg total) by mouth daily. To decrease stomach acid  Dispense: 180 capsule; Refill: 0 - H Pylori, IGM, IGG, IGA AB  3. Fatigue, unspecified type She has complained of ongoing fatigue at today's visit and she will be referred to medical weight loss management program as losing weight should also help with her fatigue as well as hypertension.  Will check CBC for anemia, T4 and TSH for thyroid disorder and basic metabolic panel to look for electrolyte abnormality or elevated glucose as a contributing factor to her fatigue. - Amb Ref to Medical Weight Management - CBC - T4 AND TSH - Basic Metabolic Panel  4. Obesity (BMI 30.0-34.9) Referral placed to ambulatory weight management due to patient's obesity and patient reports difficulty losing  weight  on her own. - Amb Ref to Medical Weight Management  An After Visit Summary was printed and given to the patient.   Follow-up: Return in about 3 months (around 04/06/2020) for GERD/HTN-f/u with Luke/CPP in 3-4 weeks for BP recheck.  Approximately 40 minutes of face-to-face time was spent with the patient at today's visit including additional 10 minutes including review of chart, placement of orders and completion of today's visit note  Antony Blackbird, MD

## 2020-01-07 LAB — BASIC METABOLIC PANEL WITH GFR
BUN/Creatinine Ratio: 23 (ref 9–23)
BUN: 21 mg/dL (ref 6–24)
CO2: 25 mmol/L (ref 20–29)
Calcium: 9.9 mg/dL (ref 8.7–10.2)
Chloride: 98 mmol/L (ref 96–106)
Creatinine, Ser: 0.92 mg/dL (ref 0.57–1.00)
GFR calc Af Amer: 82 mL/min/1.73
GFR calc non Af Amer: 71 mL/min/1.73
Glucose: 100 mg/dL — ABNORMAL HIGH (ref 65–99)
Potassium: 4.5 mmol/L (ref 3.5–5.2)
Sodium: 141 mmol/L (ref 134–144)

## 2020-01-07 LAB — H PYLORI, IGM, IGG, IGA AB
H pylori, IgM Abs: <9 U (ref 0.0–8.9)
H. pylori, IgA Abs: <9 U (ref 0.0–8.9)
H. pylori, IgG AbS: 0.14 {index_val} (ref 0.00–0.79)

## 2020-01-07 LAB — CBC
Hematocrit: 38.6 % (ref 34.0–46.6)
Hemoglobin: 12.7 g/dL (ref 11.1–15.9)
MCH: 29.5 pg (ref 26.6–33.0)
MCHC: 32.9 g/dL (ref 31.5–35.7)
MCV: 90 fL (ref 79–97)
Platelets: 384 x10E3/uL (ref 150–450)
RBC: 4.31 x10E6/uL (ref 3.77–5.28)
RDW: 12.4 % (ref 11.7–15.4)
WBC: 6.3 x10E3/uL (ref 3.4–10.8)

## 2020-01-07 LAB — T4 AND TSH
T4, Total: 6.7 ug/dL (ref 4.5–12.0)
TSH: 1.6 u[IU]/mL (ref 0.450–4.500)

## 2020-01-09 ENCOUNTER — Other Ambulatory Visit (HOSPITAL_COMMUNITY)
Admission: RE | Admit: 2020-01-09 | Discharge: 2020-01-09 | Disposition: A | Discharge status: Home / Self Care | Payer: Medicaid Other | Source: Ambulatory Visit | Attending: Internal Medicine | Admitting: Internal Medicine

## 2020-01-09 DIAGNOSIS — Z20822 Contact with and (suspected) exposure to covid-19: Secondary | ICD-10-CM | POA: Insufficient documentation

## 2020-01-09 DIAGNOSIS — Z01812 Encounter for preprocedural laboratory examination: Secondary | ICD-10-CM | POA: Insufficient documentation

## 2020-01-09 LAB — SARS CORONAVIRUS 2 (TAT 6-24 HRS): SARS Coronavirus 2: NEGATIVE

## 2020-01-10 ENCOUNTER — Encounter: Payer: Self-pay | Admitting: Family Medicine

## 2020-01-10 ENCOUNTER — Encounter (INDEPENDENT_AMBULATORY_CARE_PROVIDER_SITE_OTHER): Payer: Self-pay | Admitting: *Deleted

## 2020-01-11 ENCOUNTER — Ambulatory Visit (HOSPITAL_BASED_OUTPATIENT_CLINIC_OR_DEPARTMENT_OTHER): Payer: Medicaid Other | Attending: Family Medicine | Admitting: Internal Medicine

## 2020-01-11 ENCOUNTER — Other Ambulatory Visit: Payer: Self-pay

## 2020-01-11 DIAGNOSIS — R5383 Other fatigue: Secondary | ICD-10-CM | POA: Diagnosis present

## 2020-01-11 DIAGNOSIS — G479 Sleep disorder, unspecified: Secondary | ICD-10-CM | POA: Diagnosis not present

## 2020-01-11 DIAGNOSIS — I10 Essential (primary) hypertension: Secondary | ICD-10-CM | POA: Diagnosis not present

## 2020-01-13 ENCOUNTER — Telehealth: Payer: Self-pay

## 2020-01-13 NOTE — Telephone Encounter (Signed)
Called pt / Dob and name identified/ Results were given

## 2020-01-13 NOTE — Telephone Encounter (Signed)
Pt returned call for results.  Pt request return call to [phone] instead of home ph.

## 2020-01-21 NOTE — Procedures (Signed)
  Patient Name: Caitlin [name], Caitlin [name] Study Date: 01/11/2020 Gender: Female D.O.B: 10/20/1965 Age (years): 53 Referring Provider: Cammie Fulp Height (inches): 62 Interpreting Physician: Clinton Young MD, ABSM Weight (lbs): 189 RPSGT: Spruill, Vicki BMI: 35 MRN: [id number] Neck Size: 14.50  CLINICAL INFORMATION Sleep Study Type: Split Night CPAP Indication for sleep study: Fatigue, Hypertension, Snoring Epworth Sleepiness Score: 14  SLEEP STUDY TECHNIQUE As per the AASM Manual for the Scoring of Sleep and Associated Events v2.3 (April 2016) with a hypopnea requiring 4% desaturations.  The channels recorded and monitored were frontal, central and occipital EEG, electrooculogram (EOG), submentalis EMG (chin), nasal and oral airflow, thoracic and abdominal wall motion, anterior tibialis EMG, snore microphone, electrocardiogram, and pulse oximetry. Continuous positive airway pressure (CPAP) was initiated when the patient met split night criteria and was titrated according to treat sleep-disordered breathing.  MEDICATIONS Medications self-administered by patient taken the night of the study : AMLODIPINE  RESPIRATORY PARAMETERS Diagnostic  Total AHI (/hr): 34.0 RDI (/hr): 57.2 OA Index (/hr): 0.5 CA Index (/hr): 0.0 REM AHI (/hr): 72.0 NREM AHI (/hr): 32.4 Supine AHI (/hr): 38.0 Non-supine AHI (/hr): 18.2 Min O2 Sat (%): 82.0 Mean O2 (%): 94.2 Time below 88% (min): 1.7   Titration  Optimal Pressure (cm): 17 AHI at Optimal Pressure (/hr): 3.2 Min O2 at Optimal Pressure (%): 94.0 Supine % at Optimal (%): 100 Sleep % at Optimal (%): 96   SLEEP ARCHITECTURE The recording time for the entire night was 397 minutes.  During a baseline period of 170.7 minutes, the patient slept for 129.0 minutes in REM and nonREM, yielding a sleep efficiency of 75.6%%. Sleep onset after lights out was 28.2 minutes with a REM latency of 116.0 minutes. The patient spent 23.6%% of the night in stage N1 sleep,  72.5%% in stage N2 sleep, 0.0%% in stage N3 and 3.9% in REM.  During the titration period of 221.9 minutes, the patient slept for 206.5 minutes in REM and nonREM, yielding a sleep efficiency of 93.1%%. Sleep onset after CPAP initiation was 8.1 minutes with a REM latency of 74.0 minutes. The patient spent 11.6%% of the night in stage N1 sleep, 65.4%% in stage N2 sleep, 0.0%% in stage N3 and 23% in REM.  CARDIAC DATA The 2 lead EKG demonstrated sinus rhythm. The mean heart rate was 100.0 beats per minute. Other EKG findings include: PVCs.  LEG MOVEMENT DATA The total Periodic Limb Movements of Sleep (PLMS) were 0. The PLMS index was 0.0 .  IMPRESSIONS - Severe obstructive sleep apnea occurred during the diagnostic portion of the study (AHI = 34.0/hour). An optimal PAP pressure was selected for this patient ( 17 cm of water) - No significant central sleep apnea occurred during the diagnostic portion of the study (CAI = 0.0/hour). - Oxygen desaturation was noted during the diagnostic portion of the study (Min O2 = 82.0%). Minimum sat on CPAP 17 was 94%. - The patient snored with moderate snoring volume during the diagnostic portion of the study. - EKG findings include PVCs. - Clinically significant periodic limb movements did not occur during sleep.  DIAGNOSIS - Obstructive Sleep Apnea (327.23 [G47.33 ICD-10])  RECOMMENDATIONS - Trial of CPAP therapy on 17 cm H2O or autopap 15-20. - Patient used an X-Small size Resmed Full Face Mask AirFit F10 for Her mask and heated humidification. - Be careful with alcohol, sedatives and other CNS depressants that may worsen sleep apnea and disrupt normal sleep architecture. - Sleep hygiene should be reviewed to assess factors   that may improve sleep quality. - Weight management and regular exercise should be initiated or continued.  [Electronically signed] 01/21/2020 11:00 AM  Clinton Young MD, ABSM Diplomate, American Board of Sleep Medicine   NPI:  [id number]                        Clinton Young Diplomate, American Board of Sleep Medicine  ELECTRONICALLY SIGNED ON:  01/21/2020, 10:57 AM [facility] PH: [phone]   FX: [phone] ACCREDITED BY THE AMERICAN ACADEMY OF SLEEP MEDICINE 

## 2020-01-24 ENCOUNTER — Telehealth: Payer: Self-pay

## 2020-01-24 NOTE — Telephone Encounter (Signed)
Call placed to patient with assistance of video relay sign language interpreter # [phone].  Explained results of sleep study and recommendation for CPAP machine.  She stated that understood and had no preference for DME company. Informed her that the order would be sent to [facility].    Order needed for CPAP or autopap.  Reminded her of appointment with Franky Macho, [facility] this week  - 01/26/2020 @ 0830.  She had no concerns/questions at this time.

## 2020-01-25 MED ORDER — MISC. DEVICES MISC: 0 refills | Status: AC

## 2020-01-25 NOTE — Telephone Encounter (Signed)
Order has been placed.

## 2020-01-26 ENCOUNTER — Telehealth: Payer: Self-pay | Admitting: *Deleted

## 2020-01-26 ENCOUNTER — Encounter: Payer: Self-pay | Admitting: Pharmacist

## 2020-01-26 ENCOUNTER — Ambulatory Visit: Payer: Medicaid Other | Attending: Family Medicine | Admitting: Pharmacist

## 2020-01-26 ENCOUNTER — Other Ambulatory Visit: Payer: Self-pay

## 2020-01-26 VITALS — BP 151/84

## 2020-01-26 DIAGNOSIS — Z8249 Family history of ischemic heart disease and other diseases of the circulatory system: Secondary | ICD-10-CM | POA: Insufficient documentation

## 2020-01-26 DIAGNOSIS — Z79899 Other long term (current) drug therapy: Secondary | ICD-10-CM | POA: Diagnosis not present

## 2020-01-26 DIAGNOSIS — Z9111 Patient's noncompliance with dietary regimen: Secondary | ICD-10-CM | POA: Insufficient documentation

## 2020-01-26 DIAGNOSIS — Z9114 Patient's other noncompliance with medication regimen: Secondary | ICD-10-CM | POA: Insufficient documentation

## 2020-01-26 DIAGNOSIS — I10 Essential (primary) hypertension: Secondary | ICD-10-CM | POA: Insufficient documentation

## 2020-01-26 NOTE — Telephone Encounter (Signed)
Patient came in the office with her interpreter.  She had questions  r/t a phone call regarding a CPAP from [place]. Explained to patient the extent of the phone call from [place]. Patient verbalized understanding.

## 2020-01-26 NOTE — Progress Notes (Signed)
° °  S:    PCP: Dr. Jillyn Hidden  Patient arrives in good spirits. Presents to the clinic for hypertension evaluation, counseling, and management.  Patient was referred and last seen by Primary Care Provider on 01/05/20. BP was elevated at that visit.   Today, patient reports adherence with medications but admits to missing amlodipine yesterday. Denies chest pain, dyspnea, HA or blurred vision. Denies LE edema.   Current BP Medications include:  Amlodipine 10 mg daily, Diovan-HCT 320-25 mg daily  Dietary habits include: not fully compliant with salt restriction; denies drinking caffeine   Exercise habits include: has increased frequency of exercising to twice weekly Family / Social history:  - FHx: heart disease, stroke  - Never smoker - Denies drinking alcohol  O:  Vitals:   01/26/20 0916  BP: (!) 151/84   Home BP readings: none   Last 3 Office BP readings: BP Readings from Last 3 Encounters:  01/26/20 (!) 151/84  01/05/20 (!) 143/83  12/22/19 (!) 152/82   BMET    Component Value Date/Time   NA 141 01/05/2020 0924   NA 140 05/28/2012 1937   K 4.5 01/05/2020 0924   K 3.1 (L) 05/28/2012 1937   CL 98 01/05/2020 0924   CL 103 05/28/2012 1937   CO2 25 01/05/2020 0924   CO2 26 05/28/2012 1937   GLUCOSE 100 (H) 01/05/2020 0924   GLUCOSE 118 (H) 06/10/2016 0307   GLUCOSE 98 05/28/2012 1937   BUN 21 01/05/2020 0924   BUN 15 05/28/2012 1937   CREATININE 0.92 01/05/2020 0924   CREATININE 0.81 09/28/2015 1200   CALCIUM 9.9 01/05/2020 0924   CALCIUM 8.7 05/28/2012 1937   GFRNONAA 71 01/05/2020 0924   GFRNONAA 86 09/28/2015 1200   GFRAA 82 01/05/2020 0924   GFRAA >89 09/28/2015 1200    Renal function: CrCl cannot be calculated (Unknown ideal weight.).  Clinical ASCVD: No  The 10-year ASCVD risk score Denman George DC Jr., et al., 2013) is: 12.8%   Values used to calculate the score:     Age: 54 years     Sex: Female     Is Non-Hispanic African American: Yes     Diabetic: No      Tobacco smoker: No     Systolic Blood Pressure: 151 mmHg     Is BP treated: Yes     HDL Cholesterol: 35 mg/dL     Total Cholesterol: 200 mg/dL   A/P: Hypertension longstanding currently uncontrolled. BP Goal = <130/80 mmHg. Patient reports medication adherence but missed amlodipine last night. She reports that she sometimes misses the evening dose of amlodipine. I have asked her to start taking this in the morning along with her Diovan-HCT in hopes of improving adherence.   -Continue current regimen for now.  -Counseled on lifestyle modifications for blood pressure control including reduced dietary sodium, increased exercise, adequate sleep  Results reviewed and written information provided.   Total time in face-to-face counseling 20 minutes.   F/U Clinic Visit in 4 weeks with me.   Butch Penny, PharmD, CPP Clinical Pharmacist [facility] [phone]

## 2020-02-02 ENCOUNTER — Encounter: Payer: Self-pay | Admitting: Allergy

## 2020-02-02 ENCOUNTER — Ambulatory Visit (INDEPENDENT_AMBULATORY_CARE_PROVIDER_SITE_OTHER): Payer: Medicaid Other | Admitting: Allergy

## 2020-02-02 ENCOUNTER — Other Ambulatory Visit: Payer: Self-pay

## 2020-02-02 VITALS — BP 144/90 | HR 76 | Temp 97.8°F | Resp 16 | Ht 62.0 in | Wt 199.8 lb

## 2020-02-02 DIAGNOSIS — H1013 Acute atopic conjunctivitis, bilateral: Secondary | ICD-10-CM | POA: Diagnosis not present

## 2020-02-02 DIAGNOSIS — J4540 Moderate persistent asthma, uncomplicated: Secondary | ICD-10-CM | POA: Diagnosis not present

## 2020-02-02 DIAGNOSIS — J3089 Other allergic rhinitis: Secondary | ICD-10-CM | POA: Diagnosis not present

## 2020-02-02 DIAGNOSIS — K2100 Gastro-esophageal reflux disease with esophagitis, without bleeding: Secondary | ICD-10-CM | POA: Diagnosis not present

## 2020-02-02 MED ORDER — FLOVENT HFA 110 MCG/ACT IN AERO
2.0000 | INHALATION_SPRAY | Freq: Two times a day (BID) | RESPIRATORY_TRACT | 5 refills | Status: DC
Start: 2020-02-02 — End: 2020-05-03

## 2020-02-02 MED ORDER — OLOPATADINE HCL 0.2 % OP SOLN: OPHTHALMIC | 3 refills | Status: DC

## 2020-02-02 MED ORDER — TRIAMCINOLONE ACETONIDE 55 MCG/ACT NA AERO: 2.0000 | INHALATION_SPRAY | Freq: Every day | NASAL | 12 refills | Status: DC

## 2020-02-02 NOTE — Patient Instructions (Signed)
Asthma  - lung function testing is normal  - you have a component of exercise induced asthma  - have access to albuterol inhaler 2 puffs every 4-6 hours as needed for cough/wheeze/shortness of breath/chest tightness.  May use 15-20 minutes prior to activity.   Monitor frequency of use.    - start Flovent 110mcg 2 puffs twice a day.  Take everyday no matter how you are feeling  - use inhalers with spacer device provided today  Asthma control goals:   Full participation in all desired activities (may need albuterol before activity)  Albuterol use two time or less a week on average (not counting use with activity)  Cough interfering with sleep two time or less a month  Oral steroids no more than once a year  No hospitalizations  Allergies  - environmental allergy skin testing today is positive to grasses, weeds, trees, molds, dust mite cat and cockroach  - allergen avoidance measures discussed/handout provided  - continue Claritin 10mg daily.  This is a long-acting antihistamine  - stop Flonase.  Change to Nasacort 2 sprays each nostril daily for congestion.  Use for 1-2 weeks at a time before stopping once symptoms improve.    - use Olapatadine 0.2% 1 drop each eye daily as needed for itchy/watery/red eyes  - if medication management is ineffective then consider course of allergen immunotherapy (allergy shots)  Reflux  - continue use of omeprazole daily for control  Follow-up 3 months or sooner if needed 

## 2020-02-02 NOTE — Progress Notes (Signed)
New Patient Note  RE: Caitlin [name] MRN: [id number] DOB: May 15, 1966 Date of Office Visit: 02/02/2020  Referring provider: Antony Blackbird, MD Primary care provider: Antony Blackbird, MD  Chief Complaint: Asthma and allergy  History of present illness: Caitlin [name] is a 54 y.o. female presenting today for consultation for asthma and allergies.  She presents today with a Geologist, engineering.    She states she has been having breathing issues.  She is having shortness of breath and difficulty breathing.  She tries to exercise but has to stop as she gets short of breath.  With cleaning activities she has difficulty breathing as well.  She has stairs in her home that she also causes her to get winded.  She use to enjoy/perform activities without any issues.  She feels this has been going on several months.   Her daughter's boyfriend has her heard wheezing.  She saw her PCP after her family told her she was wheezing.  She also reports her PCP noted she was wheezing.  Wheezing she states has been going on for about 6 months.  She was prescribed albuterol inhaler and is using it daily (sometimes 3-4 times a day) due to symptoms.  She does get relief of symptoms when she uses her albuterol. She denies a previous history of asthma.  She reports symptoms of itching and swelling under her eyes with some darkening, sneezing, sometimes stuffy and runny nose.  Symptoms are worse during spring.  She has flonase which she doesn't like to use as she states it tastes funny.  She has claritin that she takes daily for past month that helps.  No eyedrop.    She did have a sleep study done.  She states someone is suppose to come to her home to help show her how to use a CPAP device.  She states that the CPAP that she used to run the study did help her sleep better.  She takes omeprazole for reflux which helps.    Review of systems: Review of Systems  Constitutional: Negative.   HENT:       See HPI  Eyes:         See HPI  Respiratory:       See HPI  Cardiovascular: Negative.   Gastrointestinal: Negative.   Musculoskeletal: Negative.   Skin:       See HPI  Neurological: Negative.     All other systems negative unless noted above in HPI  Past medical history: Past Medical History:  Diagnosis Date  . Asthma   . Deaf   . Hypertension 2011  . NO (nasal obstruction)     Past surgical history: Past Surgical History:  Procedure Laterality Date  . CESAREAN SECTION    . CHOLECYSTECTOMY    . TUBAL LIGATION      Family history:  Family History  Problem Relation Age of Onset  . Heart disease Mother   . Stroke Mother     Social history: She lives in an apartment with carpeting with electric heating and window and fan cooling.  There is concern for roaches in the home.  There is no concern for water damage or mildew in the home.  Medication List: Current Outpatient Medications  Medication Sig Dispense Refill  . albuterol (VENTOLIN HFA) 108 (90 Base) MCG/ACT inhaler Inhale 2 puffs into the lungs every 6 (six) hours as needed for wheezing or shortness of breath. 18 g 3  . amLODipine (NORVASC) 10 MG tablet  Take 1 tablet (10 mg total) by mouth daily. To lower blood pressure 90 tablet 1  . loratadine (CLARITIN) 10 MG tablet Take 1 tablet (10 mg total) by mouth daily. As needed for allergy symptoms/congestion 90 tablet 3  . omeprazole (PRILOSEC) 40 MG capsule Take 1 capsule (40 mg total) by mouth daily. To decrease stomach acid 180 capsule 0  . traZODone (DESYREL) 50 MG tablet Take 0.5-1 tablets (25-50 mg total) by mouth at bedtime as needed for sleep. 30 tablet 3  . valsartan-hydrochlorothiazide (DIOVAN-HCT) 320-25 MG tablet Take 1 tablet by mouth daily. 90 tablet 1  . Vitamin D, Ergocalciferol, (DRISDOL) 1.25 MG (50000 UNIT) CAPS capsule Take 1 capsule (50,000 Units total) by mouth every 7 (seven) days. 16 capsule 0  . Blood Pressure Monitor KIT Use to check blood pressure daily 1 kit 0   . fluticasone (FLOVENT HFA) 110 MCG/ACT inhaler Inhale 2 puffs into the lungs 2 (two) times daily. 1 Inhaler 5  . Misc. Devices MISC CPAP therapy on 17 cm H2O . Patient to use X-Small size Resmed Full Face Mask AirFit F10. Diagnosis - Obstructive sleep apnea 1 each 0  . Olopatadine HCl 0.2 % SOLN 1 drop each eye daily as needed for itchy/watery/red eyes. 2.5 mL 3  . terbinafine (LAMISIL) 250 MG tablet Take 1 tablet (250 mg total) by mouth daily. (Patient not taking: Reported on 02/02/2020) 90 tablet 0  . triamcinolone (NASACORT) 55 MCG/ACT AERO nasal inhaler Place 2 sprays into the nose daily. 1 Inhaler 12   No current facility-administered medications for this visit.    Known medication allergies: Allergies  Allergen Reactions  . Eggs Or Egg-Derived Products Nausea And Vomiting  . Milk-Related Compounds      Physical examination: Blood pressure (!) 144/90, pulse 76, temperature 97.8 F (36.6 C), temperature source Temporal, resp. rate 16, height '5\' 2"'  (1.575 m), weight 199 lb 12.8 oz (90.6 kg), last menstrual period 05/08/2016, SpO2 97 %.  General: Alert, interactive, in no acute distress. HEENT: PERRLA, TMs pearly gray, turbinates mildly edematous without discharge, post-pharynx non erythematous. Neck: Supple without lymphadenopathy. Lungs: Clear to auscultation without wheezing, rhonchi or rales. {no increased work of breathing. CV: Normal S1, S2 without murmurs. Abdomen: Nondistended, nontender. Skin: Warm and dry, without lesions or rashes.  Mild hyperpigmentation of the lower lid bilaterally Extremities:  No clubbing, cyanosis or edema. Neuro:   Grossly intact.  Diagnositics/Labs: Spirometry: FEV1 2.35 L 114%, FVC 2.8 L 108% predicted value.  This is a normal study, nonobstructive pattern  Allergy testing: Environmental allergy skin prick testing is positive to positive to grasses, weeds, trees, molds, dust mite cat and cockroach.    Allergy testing results were read and  interpreted by provider, documented by clinical staff.   Assessment and plan:   Asthma, mod persistent  - lung function testing is normal  - you have a component of exercise induced asthma  - have access to albuterol inhaler 2 puffs every 4-6 hours as needed for cough/wheeze/shortness of breath/chest tightness.  May use 15-20 minutes prior to activity.   Monitor frequency of use.    - start Flovent 148mg 2 puffs twice a day.  Take everyday no matter how you are feeling  - use inhalers with spacer device provided today  Asthma control goals:   Full participation in all desired activities (may need albuterol before activity)  Albuterol use two time or less a week on average (not counting use with activity)  Cough interfering with  sleep two time or less a month  Oral steroids no more than once a year  No hospitalizations  Allergic rhinitis with conjunctivitis  - environmental allergy skin testing today is positive to grasses, weeds, trees, molds, dust mite cat and cockroach  - allergen avoidance measures discussed/handout provided  - continue Claritin 8m daily.  This is a long-acting antihistamine  - stop Flonase.  Change to Nasacort 2 sprays each nostril daily for congestion.  Use for 1-2 weeks at a time before stopping once symptoms improve.    - use Olapatadine 0.2% 1 drop each eye daily as needed for itchy/watery/red eyes  - if medication management is ineffective then consider course of allergen immunotherapy (allergy shots)  Reflux  - continue use of omeprazole daily for control  Follow-up 3 months or sooner if needed  I appreciate the opportunity to take part in Pieper's care. Please do not hesitate to contact me with questions.  Sincerely,   SPrudy Feeler MD Allergy/Immunology [facility]

## 2020-02-23 ENCOUNTER — Ambulatory Visit: Payer: Medicaid Other | Admitting: Family Medicine

## 2020-02-23 ENCOUNTER — Ambulatory Visit: Payer: Medicaid Other | Admitting: Pharmacist

## 2020-02-28 ENCOUNTER — Telehealth: Payer: Self-pay

## 2020-02-28 NOTE — Telephone Encounter (Signed)
Call placed to [facility], spoke to [place] who confirmed that the patient was set up with her CPAP machine today

## 2020-03-06 ENCOUNTER — Telehealth: Payer: Self-pay | Admitting: Allergy

## 2020-03-06 MED ORDER — OLOPATADINE HCL 0.2 % OP SOLN: OPHTHALMIC | 3 refills | Status: DC

## 2020-03-06 NOTE — Telephone Encounter (Signed)
Patient called and states she never received eye drops and her eyes are still itching. Informed patient that the prescription was sent to [facility] and patient states that is the wrong pharmacy. Please sent to [facility] pharmacy in [facility]  Please advise.

## 2020-03-06 NOTE — Telephone Encounter (Signed)
Eye drops sent to preferred pharmacy, left message thru the interpreter service.

## 2020-03-27 NOTE — Progress Notes (Unsigned)
S:    Patient arrives in no acute distress. Presents to the clinic for hypertension evaluation, counseling, and management. Patient was referred and last seen by Primary Care Provider on 01/05/20.    Pt last seen by PharmD for HTN on 01/26/20. At that visit amlodipine switched to take in AM to help improve adherence.   Pertinent PMH: moderate persistent asthma managed by Allergist  Current BP Medications include:   1. Amlodipine 10 mg daily in AM 2. Valsartan/HCTZ 320-25 mg daily in AM  Adverse Effects:  - {Actions; denies-reports:120008} {Blank:19196::"dizziness/lightheadedness","headaches","chest pain"," shortness of breath","blurred vision"}  - ACEI/ARB: {Blank multiple:19196::"dry cough","angioedema"}  - DHP-CCB: {Blank multiple:19196::"peripheral edema","flushing","palpitations","rapid HR","gingival hyperplasia"}  - Thiazide diuretic: {Blank multiple:19196::"bothersome urination frequency","photosensitivity"}  Adherence:  Antihypertensives tried in the past include:  Lisinopril, Lisinopril/HCTZ  Lifestyle: Dietary:   - Sodium: *** goal < 1500 mg/day  - Caffeine: *** Exercise: twice weekly? Limited by asthma Tobacco use: never smoker  Alcohol: denies Stress: *** Sleep: using CPAP? NSAID use: *** Family history: heart disease, stroke  ASCVD risk factors include:***  O:  Home BP readings: ***  There were no vitals filed for this visit.  BMET K 4.5, Scr 0.92 01/05/20  Renal function: 74 mL/min (adjusted BW)  Clinical ASCVD: 10 year risk 10.8%  A: - Clinic BP *** at goal of < 130/80 - Adherent to current medications, tolerating well - Current BP meds at max dose. Preference to avoid beta blockers d/t asthma  - Spironolactone 25 mg daily - need new BMP today    P: - Continue - F/u labs ordered: BMP today - Lifestyle ***   Total time in face-to-face counseling *** minutes.   F/U Pharmacist Clinic Visit in 1 week.  Patient seen with ***

## 2020-03-28 ENCOUNTER — Ambulatory Visit: Payer: Medicaid Other | Admitting: Pharmacist

## 2020-04-04 ENCOUNTER — Other Ambulatory Visit: Payer: Self-pay | Admitting: Family Medicine

## 2020-04-04 DIAGNOSIS — Z1231 Encounter for screening mammogram for malignant neoplasm of breast: Secondary | ICD-10-CM

## 2020-04-06 ENCOUNTER — Ambulatory Visit: Payer: Medicaid Other | Admitting: Family Medicine

## 2020-04-18 ENCOUNTER — Ambulatory Visit: Payer: Medicaid Other | Admitting: Physician Assistant

## 2020-04-19 ENCOUNTER — Ambulatory Visit: Payer: Medicaid Other

## 2020-05-03 ENCOUNTER — Other Ambulatory Visit: Payer: Self-pay

## 2020-05-03 ENCOUNTER — Encounter: Payer: Self-pay | Admitting: Allergy

## 2020-05-03 ENCOUNTER — Telehealth: Payer: Self-pay | Admitting: *Deleted

## 2020-05-03 ENCOUNTER — Ambulatory Visit (INDEPENDENT_AMBULATORY_CARE_PROVIDER_SITE_OTHER): Payer: Medicaid Other | Admitting: Allergy

## 2020-05-03 VITALS — BP 132/84 | HR 76 | Resp 18

## 2020-05-03 DIAGNOSIS — K2100 Gastro-esophageal reflux disease with esophagitis, without bleeding: Secondary | ICD-10-CM | POA: Diagnosis not present

## 2020-05-03 DIAGNOSIS — J4540 Moderate persistent asthma, uncomplicated: Secondary | ICD-10-CM | POA: Diagnosis not present

## 2020-05-03 DIAGNOSIS — H1013 Acute atopic conjunctivitis, bilateral: Secondary | ICD-10-CM | POA: Diagnosis not present

## 2020-05-03 DIAGNOSIS — J3089 Other allergic rhinitis: Secondary | ICD-10-CM | POA: Diagnosis not present

## 2020-05-03 MED ORDER — FLOVENT HFA 110 MCG/ACT IN AERO
2.0000 | INHALATION_SPRAY | Freq: Two times a day (BID) | RESPIRATORY_TRACT | 5 refills | Status: DC
Start: 2020-05-03 — End: 2021-12-12

## 2020-05-03 MED ORDER — TRIAMCINOLONE ACETONIDE 55 MCG/ACT NA AERO
2.0000 | INHALATION_SPRAY | Freq: Every day | NASAL | 5 refills | Status: DC
Start: 2020-05-03 — End: 2023-04-22

## 2020-05-03 MED ORDER — OLOPATADINE HCL 0.7 % OP SOLN: OPHTHALMIC | 5 refills | Status: DC

## 2020-05-03 NOTE — Telephone Encounter (Signed)
Will speak with Caitlin [name] and determine the best course of action to get her COVID test results.

## 2020-05-03 NOTE — Telephone Encounter (Signed)
-----   Message from [facility] Larose Hires, MD sent at 05/03/2020 11:18 AM EDT ----- Pt is deaf and uses ASL translator.  During visit today while examining her mouth and nose with masked translator in the room she mentioned with recent runny nose she went and had Covid testing yesterday 05/02/20 and results are pending.  Will need to get her results.

## 2020-05-03 NOTE — Patient Instructions (Signed)
Asthma  - lung function testing is normal  - you have a component of exercise induced asthma  - have access to albuterol inhaler 2 puffs every 4-6 hours as needed for cough/wheeze/shortness of breath/chest tightness.  May use 15-20 minutes prior to activity.   Monitor frequency of use.    - start Flovent 2 puffs twice a day.  Take everyday no matter how you are feeling  - use inhalers with spacer device provided today  Asthma control goals:   Full participation in all desired activities (may need albuterol before activity)  Albuterol use two time or less a week on average (not counting use with activity)  Cough interfering with sleep two time or less a month  Oral steroids no more than once a year  No hospitalizations  Allergies  - environmental allergy skin testing today is positive to grasses, weeds, trees, molds, dust mite cat and cockroach  - allergen avoidance measures discussed/handout provided  - continue Claritin 10mg  daily.  This is a long-acting antihistamine  - stop Flonase.  Change to Nasacort 2 sprays each nostril daily for congestion.  Use for 1-2 weeks at a time before stopping once symptoms improve.    - use Olapatadine 0.2% 1 drop each eye daily as needed for itchy/watery/red eyes  - if medication management is ineffective then consider course of allergen immunotherapy (allergy shots)  Reflux  - continue use of omeprazole daily for control  Follow-up 3 months or sooner if needed

## 2020-05-03 NOTE — Progress Notes (Signed)
Follow-up Note  RE: Raylene Carmickle MRN: [id number] DOB: 04-24-1966 Date of Office Visit: 05/03/2020   History of present illness: Kianah Harries is a 54 y.o. female presenting today for follow-up of asthma, allergic rhinitis with conjunctivitis and reflux.  She was last seen in the office on 02/02/2020 by myself.  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  She states she did not get the Flovent after the last visit.  She states it was in her pharmacy.  Thus she has continued to have cough, shortness of breath with mostly exercise.  She has continued to need to use her rescue inhaler for relief of symptoms.  She does report using her albuterol prior to activity if she has a plan.  She however has not required any ED or urgent care visits when systemic steroid needs since the last visit. With her allergies she states she is really only having occasional tearing of the eyes.  She does take Claritin daily.  She states the Nasacort also was not at the pharmacy.  The only thing she was able to pick up after the last visit was the eyedrop which she states does help especially with itching. She continues to take omeprazole for control of her reflux and states this is still effective. Upon review of her medications it appears that the Flovent in the Nasacort was sent to a different pharmacy as to why she did not have this available.  Review of systems: Review of Systems  Constitutional: Negative.   HENT: Negative.   Eyes:       See HPI  Respiratory:       See HPI  Cardiovascular: Negative.   Gastrointestinal: Negative.   Musculoskeletal: Negative.   Skin: Negative.   Neurological: Negative.     All other systems negative unless noted above in HPI  Past medical/social/surgical/family history have been reviewed and are unchanged unless specifically indicated below.  No changes  Medication List: Current Outpatient Medications  Medication Sig Dispense Refill  . albuterol (VENTOLIN  HFA) 108 (90 Base) MCG/ACT inhaler Inhale 2 puffs into the lungs every 6 (six) hours as needed for wheezing or shortness of breath. 18 g 3  . amLODipine (NORVASC) 10 MG tablet Take 1 tablet (10 mg total) by mouth daily. To lower blood pressure 90 tablet 1  . Blood Pressure Monitor KIT Use to check blood pressure daily 1 kit 0  . loratadine (CLARITIN) 10 MG tablet Take 1 tablet (10 mg total) by mouth daily. As needed for allergy symptoms/congestion 90 tablet 3  . Misc. Devices MISC CPAP therapy on 17 cm H2O . Patient to use X-Small size Resmed Full Face Mask AirFit F10. Diagnosis - Obstructive sleep apnea 1 each 0  . omeprazole (PRILOSEC) 40 MG capsule Take 1 capsule (40 mg total) by mouth daily. To decrease stomach acid 180 capsule 0  . traZODone (DESYREL) 50 MG tablet Take 0.5-1 tablets (25-50 mg total) by mouth at bedtime as needed for sleep. 30 tablet 3  . triamcinolone (NASACORT) 55 MCG/ACT AERO nasal inhaler Place 2 sprays into the nose daily. 16.5 g 5  . valsartan-hydrochlorothiazide (DIOVAN-HCT) 320-25 MG tablet Take 1 tablet by mouth daily. 90 tablet 1  . fluticasone (FLOVENT HFA) 110 MCG/ACT inhaler Inhale 2 puffs into the lungs 2 (two) times daily. 12 g 5  . Olopatadine HCl 0.7 % SOLN 1 drop each eye daily as needed for itchy/watery/red eyes 2.5 mL 5  . Vitamin D, Ergocalciferol, (DRISDOL) 1.25  MG (50000 UNIT) CAPS capsule Take 1 capsule (50,000 Units total) by mouth every 7 (seven) days. (Patient not taking: Reported on 05/03/2020) 16 capsule 0   No current facility-administered medications for this visit.     Known medication allergies: Allergies  Allergen Reactions  . Eggs Or Egg-Derived Products Nausea And Vomiting  . Milk-Related Compounds      Physical examination: Blood pressure 132/84, pulse 76, resp. rate 18, last menstrual period 05/08/2016, SpO2 97 %.  General: Alert, interactive, in no acute distress. HEENT: PERRLA, TMs pearly gray, turbinates minimally edematous  without discharge, post-pharynx non erythematous. Neck: Supple without lymphadenopathy. Lungs: Clear to auscultation without wheezing, rhonchi or rales. {no increased work of breathing. CV: Normal S1, S2 without murmurs. Abdomen: Nondistended, nontender. Skin: Warm and dry, without lesions or rashes. Extremities:  No clubbing, cyanosis or edema. Neuro:   Grossly intact.  Diagnositics/Labs: None today  Assessment and plan:   Asthma, mod persistent  - lung function testing is normal  - you have a component of exercise induced asthma  - have access to albuterol inhaler 2 puffs every 4-6 hours as needed for cough/wheeze/shortness of breath/chest tightness.  May use 15-20 minutes prior to activity.   Monitor frequency of use.    - start Flovent 172mg 2 puffs twice a day.  Take everyday no matter how you are feeling  - use inhalers with spacer device provided today  Asthma control goals:   Full participation in all desired activities (may need albuterol before activity)  Albuterol use two time or less a week on average (not counting use with activity)  Cough interfering with sleep two time or less a month  Oral steroids no more than once a year  No hospitalizations  Allergic rhinitis with conjunctivitis  - environmental allergy skin testing today is positive to grasses, weeds, trees, molds, dust mite cat and cockroach  - allergen avoidance measures discussed/handout provided  - continue Claritin 158mdaily.  This is a long-acting antihistamine  - stop Flonase.  Change to Nasacort 2 sprays each nostril daily for congestion.  Use for 1-2 weeks at a time before stopping once symptoms improve.    - use Olapatadine 0.2% 1 drop each eye daily as needed for itchy/watery/red eyes  - if medication management is ineffective then consider course of allergen immunotherapy (allergy shots)  Reflux  - continue use of omeprazole daily for control  Follow-up 3 months or sooner if needed   I  appreciate the opportunity to take part in Nicoletta's care. Please do not hesitate to contact me with questions.  Sincerely,   ShPrudy FeelerMD Allergy/Immunology [facility] [place] [place]

## 2020-05-04 NOTE — Telephone Encounter (Signed)
Called number provided by patient through phone interpreter for person that did Covid testing.  Spoke with Consuella Lose at [phone] with permission from [place] and per Consuella Lose she goes to locations and homes to do Covid swabs.  Consuella Lose states she was doing a swab as she was talking to me.  She was able to confirm testing is PCR and turn around time is 72 hours.  Patients are notified of results by phone call. Will follow up with call to [facility] on Monday to see if she has received her results.

## 2020-05-04 NOTE — Telephone Encounter (Signed)
Spoke with patient on 05/03/20 through phone interpreter.  First phone number Jazell left regarding her Covid testing was incorrect number.  Corena called back with what she thought was the name of the company, but it was not correct.  She was going to call the office back when she got home and could look the number up.  Patient did call back after she picked kids up from school and left message that it was Consuella Lose at [phone] that did her Covid testing.  Will follow up with phone call.

## 2020-05-07 NOTE — Telephone Encounter (Signed)
Called and spoke with patient through phone interpreter.  Patient relayed she called over the weekend for her Covid swab results.  She was told to call back Monday.  Today she has called a couple of times and only gets answering machine.  Patient states she is feeling well and will continue to call for her results. Informed patient Dr. Delorse Lek would be updated.

## 2020-05-08 NOTE — Telephone Encounter (Signed)
Please advise 

## 2020-05-08 NOTE — Telephone Encounter (Signed)
Informed pt thru sign language interpreter services of dr Delorse Lek stating it is ok for pt to get vaccine

## 2020-05-08 NOTE — Telephone Encounter (Signed)
Patient called through interpreter service, states COVID test was negative and would like to know if she can go ahead and get her vaccine at [facility].  Please advise.

## 2020-05-08 NOTE — Telephone Encounter (Signed)
Yes

## 2020-06-08 ENCOUNTER — Telehealth: Payer: Self-pay | Admitting: Family Medicine

## 2020-06-08 NOTE — Telephone Encounter (Signed)
Copied from CRM [id number]. Topic: General - Other >> Jun 06, 2020 11:02 AM Gwenlyn Fudge wrote: Reason for CRM: Harlin Rain, from [facility], called and is requesting to follow up on prescription faxed to the office on 06/04/20 regarding the pts CPAP supplies. Please advise.

## 2020-06-11 NOTE — Telephone Encounter (Signed)
What is the order? Is this regarding incontinence supplies?

## 2020-06-11 NOTE — Telephone Encounter (Signed)
Please try to review the chart to see where patient originally received CPAP supplies.  You can also discuss with Erskine Squibb who may be able to help.  I will check to see if I have received these orders regarding CPAP supplies.  Also check to see if patient has had office visit with an 6 months regarding sleep apnea

## 2020-06-12 ENCOUNTER — Telehealth: Payer: Self-pay

## 2020-06-12 NOTE — Telephone Encounter (Signed)
Spoke w/Edmond at [facility] to have them refax prescription for Cpap supplies.

## 2020-06-12 NOTE — Telephone Encounter (Signed)
Contacted [facility] to have them refax Rx for CPAP supplies. Pt was seen by you on 01/05/20 prior to having sleep study completed so she has been seen within 6 months

## 2020-06-13 ENCOUNTER — Other Ambulatory Visit: Payer: Self-pay | Admitting: Family Medicine

## 2020-06-13 NOTE — Telephone Encounter (Signed)
Order has been signed and is ready to be returned by fax

## 2020-06-13 NOTE — Telephone Encounter (Signed)
Order has been signed and just needs to be returned via fax

## 2020-06-13 NOTE — Progress Notes (Signed)
Patient ID: Caitlin [name], female   DOB: 11-15-1965, 54 y.o.   MRN: [id number]   Order received and completed for CPAP supplies based on sleep study results from 01/11/2020 and will be returned by fax to [facility] at fax number of [phone].

## 2020-06-21 ENCOUNTER — Telehealth: Payer: Self-pay | Admitting: Family Medicine

## 2020-06-21 NOTE — Telephone Encounter (Signed)
Contacted [facility] to verify that they rcvd physicians order back on 10/28 it was faxed to [phone] Spoke w/Leah and she stated that this is not where the customer account is serviced and gave me same call back number [phone]

## 2020-06-21 NOTE — Telephone Encounter (Signed)
Copied from CRM [id number]. Topic: General - Other >> Jun 21, 2020  9:44 AM Dalphine Handing A wrote: [facility] requesting callback for status update on form faxed over for cpap supplies 2 days ago and also stated the form is time sensitive and the request will be voided after today and if form isnt faxed back by today. Best contact [phone]

## 2020-07-04 ENCOUNTER — Ambulatory Visit: Payer: Medicaid Other | Attending: Family Medicine | Admitting: Family Medicine

## 2020-07-04 ENCOUNTER — Other Ambulatory Visit: Payer: Self-pay

## 2020-07-04 ENCOUNTER — Encounter: Payer: Self-pay | Admitting: Family Medicine

## 2020-07-04 ENCOUNTER — Other Ambulatory Visit (HOSPITAL_COMMUNITY)
Admission: RE | Admit: 2020-07-04 | Discharge: 2020-07-04 | Disposition: A | Discharge status: Home / Self Care | Payer: Medicaid Other | Source: Ambulatory Visit | Attending: Family Medicine | Admitting: Family Medicine

## 2020-07-04 VITALS — BP 154/83 | HR 83 | Wt 196.8 lb

## 2020-07-04 DIAGNOSIS — Z1231 Encounter for screening mammogram for malignant neoplasm of breast: Secondary | ICD-10-CM | POA: Diagnosis not present

## 2020-07-04 DIAGNOSIS — Z124 Encounter for screening for malignant neoplasm of cervix: Secondary | ICD-10-CM | POA: Insufficient documentation

## 2020-07-04 DIAGNOSIS — K047 Periapical abscess without sinus: Secondary | ICD-10-CM

## 2020-07-04 DIAGNOSIS — Z01419 Encounter for gynecological examination (general) (routine) without abnormal findings: Secondary | ICD-10-CM | POA: Insufficient documentation

## 2020-07-04 DIAGNOSIS — R3915 Urgency of urination: Secondary | ICD-10-CM

## 2020-07-04 DIAGNOSIS — Z113 Encounter for screening for infections with a predominantly sexual mode of transmission: Secondary | ICD-10-CM

## 2020-07-04 DIAGNOSIS — Z01411 Encounter for gynecological examination (general) (routine) with abnormal findings: Secondary | ICD-10-CM | POA: Diagnosis not present

## 2020-07-04 LAB — POCT URINALYSIS DIP (CLINITEK)
Bilirubin, UA: NEGATIVE
Glucose, UA: NEGATIVE mg/dL
Ketones, POC UA: NEGATIVE mg/dL
Leukocytes, UA: NEGATIVE
Nitrite, UA: NEGATIVE
POC PROTEIN,UA: NEGATIVE
Spec Grav, UA: 1.02
Urobilinogen, UA: 0.2 U/dL
pH, UA: 6

## 2020-07-04 MED ORDER — AMOXICILLIN 500 MG PO CAPS: 500.0000 mg | ORAL_CAPSULE | Freq: Two times a day (BID) | ORAL | 0 refills | Status: AC

## 2020-07-04 NOTE — Patient Instructions (Signed)
Health Maintenance, Female Adopting a healthy lifestyle and getting preventive care are important in promoting health and wellness. Ask your health care provider about:  The right schedule for you to have regular tests and exams.  Things you can do on your own to prevent diseases and keep yourself healthy. What should I know about diet, weight, and exercise? Eat a healthy diet   Eat a diet that includes plenty of vegetables, fruits, low-fat dairy products, and lean protein.  Do not eat a lot of foods that are high in solid fats, added sugars, or sodium. Maintain a healthy weight Body mass index (BMI) is used to identify weight problems. It estimates body fat based on height and weight. Your health care provider can help determine your BMI and help you achieve or maintain a healthy weight. Get regular exercise Get regular exercise. This is one of the most important things you can do for your health. Most adults should:  Exercise for at least 150 minutes each week. The exercise should increase your heart rate and make you sweat (moderate-intensity exercise).  Do strengthening exercises at least twice a week. This is in addition to the moderate-intensity exercise.  Spend less time sitting. Even light physical activity can be beneficial. Watch cholesterol and blood lipids Have your blood tested for lipids and cholesterol at 54 years of age, then have this test every 5 years. Have your cholesterol levels checked more often if:  Your lipid or cholesterol levels are high.  You are older than 54 years of age.  You are at high risk for heart disease. What should I know about cancer screening? Depending on your health history and family history, you may need to have cancer screening at various ages. This may include screening for:  Breast cancer.  Cervical cancer.  Colorectal cancer.  Skin cancer.  Lung cancer. What should I know about heart disease, diabetes, and high blood  pressure? Blood pressure and heart disease  High blood pressure causes heart disease and increases the risk of stroke. This is more likely to develop in people who have high blood pressure readings, are of African descent, or are overweight.  Have your blood pressure checked: ? Every 3-5 years if you are 7-62 years of age. ? Every year if you are 44 years old or older. Diabetes Have regular diabetes screenings. This checks your fasting blood sugar level. Have the screening done:  Once every three years after age 61 if you are at a normal weight and have a low risk for diabetes.  More often and at a younger age if you are overweight or have a high risk for diabetes. What should I know about preventing infection? Hepatitis B If you have a higher risk for hepatitis B, you should be screened for this virus. Talk with your health care provider to find out if you are at risk for hepatitis B infection. Hepatitis C Testing is recommended for:  Everyone born from [birth year] through 1965.  Anyone with known risk factors for hepatitis C. Sexually transmitted infections (STIs)  Get screened for STIs, including gonorrhea and chlamydia, if: ? You are sexually active and are younger than 54 years of age. ? You are older than 54 years of age and your health care provider tells you that you are at risk for this type of infection. ? Your sexual activity has changed since you were last screened, and you are at increased risk for chlamydia or gonorrhea. Ask your health care provider if  you are at risk.  Ask your health care provider about whether you are at high risk for HIV. Your health care provider may recommend a prescription medicine to help prevent HIV infection. If you choose to take medicine to prevent HIV, you should first get tested for HIV. You should then be tested every 3 months for as long as you are taking the medicine. Pregnancy  If you are about to stop having your period (premenopausal) and  you may become pregnant, seek counseling before you get pregnant.  Take 400 to 800 micrograms (mcg) of folic acid every day if you become pregnant.  Ask for birth control (contraception) if you want to prevent pregnancy. Osteoporosis and menopause Osteoporosis is a disease in which the bones lose minerals and strength with aging. This can result in bone fractures. If you are 65 years old or older, or if you are at risk for osteoporosis and fractures, ask your health care provider if you should:  Be screened for bone loss.  Take a calcium or vitamin D supplement to lower your risk of fractures.  Be given hormone replacement therapy (HRT) to treat symptoms of menopause. Follow these instructions at home: Lifestyle  Do not use any products that contain nicotine or tobacco, such as cigarettes, e-cigarettes, and chewing tobacco. If you need help quitting, ask your health care provider.  Do not use street drugs.  Do not share needles.  Ask your health care provider for help if you need support or information about quitting drugs. Alcohol use  Do not drink alcohol if: ? Your health care provider tells you not to drink. ? You are pregnant, may be pregnant, or are planning to become pregnant.  If you drink alcohol: ? Limit how much you use to 0-1 drink a day. ? Limit intake if you are breastfeeding.  Be aware of how much alcohol is in your drink. In the [place] one drink equals one 12 oz bottle of beer (355 mL), one 5 oz glass of wine (148 mL), or one 1 oz glass of hard liquor (44 mL). General instructions  Schedule regular health, dental, and eye exams.  Stay current with your vaccines.  Tell your health care provider if: ? You often feel depressed. ? You have ever been abused or do not feel safe at home. Summary  Adopting a healthy lifestyle and getting preventive care are important in promoting health and wellness.  Follow your health care provider's instructions about healthy  diet, exercising, and getting tested or screened for diseases.  Follow your health care provider's instructions on monitoring your cholesterol and blood pressure. This information is not intended to replace advice given to you by your health care provider. Make sure you discuss any questions you have with your health care provider. Document Revised: 07/28/2018 Document Reviewed: 07/28/2018 Elsevier Patient Education  2020 [employer]  Cancer Screening for Women A cancer screening is a test or exam that checks for cancer. Your health care provider will recommend specific cancer screenings based on your age, personal history, and family history of cancer. Work with your health care provider to create a cancer screening schedule that protects your health. Why is cancer screening done? Cancer screening is done to look for cancer in the very early stages, before it spreads and becomes harder to treat and before you would start to notice symptoms. Finding cancer early improves the chances of successful treatment. It may save your life. Who should be screened for cancer? All women   should be screened for certain cancers, including breast cancer, cervical cancer, and skin cancer. Your health care provider may recommend screenings for other types of cancer if:  You had cancer before.  You have a family member with cancer.  You have abnormal genes that could increase the risk of cancer.  You have risk factors for certain cancers, such as smoking. When you should be screened for cancer depends on:  Your age.  Your medical history and your family's medical history.  Certain lifestyle factors, such as smoking.  Environmental exposure, such as to asbestos. What are some common cancer screenings? Breast cancer Breast cancer screening is done with a test that takes images of breast tissue (mammogram). Here are some screening guidelines:  When you are age 83-44, you will be given the choice to  start having mammograms.  When you are age 17-54, you should have a mammogram every year.  You may start having mammograms before age 43 if you have risk factors for breast cancer, such as having an immediate family member with breast cancer.  When you are age 76 or older, you should have a mammogram every 1-2 years for as long as you are in good health and have a life expectancy of 10 years or more.  It is important to know what your breasts look and feel like so you can report any changes to your health care provider.  Cervical cancer Cervical cancer screening is done with a Pap test. This testchecks for abnormalities, including the virus that causes cervical cancer (human papillomavirus, or HPV). To perform the test, a health care provider takes a swab of cervical cells during a pelvic exam. Screening for cervical cancer with a Pap test should start at age 36. Here are some screening guidelines:  When you are age 48-29, you should have a Pap test every 3 years.  When you are age 66-65, you should have a Pap test and HPV test every 5 years or have a Pap test every 3 years.  You may be screened for cervical cancer more often if you have risk factors for cervical cancer.  If your Pap tests are abnormal, you may have an HPV test.  If you have had the HPV vaccine, you will still be screened for cervical cancer and follow normal screening recommendations. You do not need to be screened for cervical cancer if any of the following apply to you:  You are older than age 6 and you have not had a serious cervical precancer or cancer in the last 20 years.  Your cervix and uterus have been removed and you have never had cervical cancer or precancerous cells. Endometrial cancer There is no standard screening test for endometrial cancer, but the cancer can be detected with:  A test of a sample of tissue taken from the lining of the uterus (endometrial tissue biopsy).  A vaginal ultrasound.  Pap  tests. If you are at increased risk for endometrial cancer, you may need to have these tests more often than normal. You are at increased risk if:  You have a family history of ovarian, uterine, or colon cancer.  You are taking tamoxifen, a drug that is used to treat breast cancer.  You have certain types of colon cancer. If you have reached menopause, it is especially important to talk with your health care provider about any vaginal bleeding or spotting. Screening for endometrial cancer is not recommended for women who do not have symptoms of the cancer,  such as vaginal bleeding. Colorectal cancer  All adults should have screening for colorectal cancer starting at age 50 and continuing until age 75. Your health care provider may recommend screening at age 45. You will have tests every 1-10 years, depending on your results and the type of screening test. If you have a family history of colon or rectal cancer or other risk factors, you may need to start having screenings earlier. Talk with your health care provider about which screening test is right for you and how often you should be screened. Colorectal cancer screening looks for cancer or for growths called polyps that often form before cancer starts. Tests to look for cancer or polyps include:  Colonoscopy or flexible sigmoidoscopy. For these procedures, a flexible tube with a small camera is inserted into the rectum.  CT colonography. This test uses X-rays and a contrast dye to check the colon for polyps. If a polyp is found, you may need to have a colonoscopy so the polyp can be located and removed. Tests to look for cancer in the stool (feces) include:  Guaiac-based fecal occult blood test (FOBT). This test detects blood in stool. It can be done at home with a kit.  Fecal immunochemical test (FIT). This test detects blood in stool. For this test, you will need to collect stool samples at home.  Stool DNA test. This test looks for blood  in stool and any changes in DNA that can lead to colon cancer. For this test, you will need to collect a stool sample at home and send it to a lab.  Skin cancer Skin cancer screening is done by checking the skin for unusual moles or spots and any changes in existing moles. Your health care provider should check your skin for signs of skin cancer at every physical exam. You should check your skin every month and tell your health care provider right away if anything looks unusual. Women with a higher-than-normal risk for skin cancer may want to see a skin specialist (dermatologist) for an annual body check. Lung cancer Lung cancer screening is done with a CT scan that looks for abnormal cells in the lungs. Discuss lung cancer screening with your health care provider if you are 55-74 years old and if any of the following apply to you:  You currently smoke.  You used to smoke heavily.  You have a smoking history of 1 pack a day for 30 years or 2 packs a day for 15 years.  You have quit smoking within the past 15 years. If you smoke heavily or if you used to smoke, you may need to be screened every year. Where to find more information  National Cancer Institute: https://www.cancer.gov/about-cancer/screening  Centers for Disease Control and Prevention: https://www.cdc.gov/cancer/dcpc/prevention/screening.htm  Department of Health and Human Services: https://www.womenshealth.gov/screening-tests-and-vaccines/screening-tests-for-women Contact a health care provider if:  You have concerns about any signs or symptoms of cancer, such as: ? Moles that have an unusual shape or color. ? Changes in existing moles. ? A sore on your skin that does not heal. ? Blood in your stool. ? Fatigue that does not go away. ? Frequent pain or cramping in your abdomen. ? Coughing, or coughing up blood. ? Losing weight without trying. ? Lumps or other changes in your breasts. ? Vaginal bleeding, spotting, or  changes in your periods. Summary  Be aware of and watch for signs and symptoms of cancer, especially symptoms of breast cancer, cervical cancer, endometrial cancer, colorectal cancer, skin   cancer, and lung cancer.  Early detection of cancer with cancer screening may save your life.  Talk with your health care provider about your specific cancer risks.  Work together with your health care provider to create a cancer screening plan that is right for you. This information is not intended to replace advice given to you by your health care provider. Make sure you discuss any questions you have with your health care provider. Document Revised: 11/24/2018 Document Reviewed: 05/01/2016 Elsevier Patient Education  [place].

## 2020-07-04 NOTE — Progress Notes (Signed)
Established Patient Office Visit  Subjective:  Patient ID: Caitlin [name], female    DOB: March 09, 1966  Age: 54 y.o. MRN: [id number]   Due to a language barrier, patient is accompanied by a sign language interpreter at today's visit.  CC:  Chief Complaint  Patient presents with  . Annual Exam    HPI Caitlin [name] [place], 54 year old female who presents for annual well exam.  She is not sure when she last had her Pap smear done.  She is sure that this has been more than 3 years.  She denies any history of abnormal Pap smears.  She denies any issues with abdominal or pelvic pain.  She has not had any issues with vaginal discharge.  Per CMA, patient has requested testing for sexually transmitted infections.  Patient reports that she has been attempting to schedule her mammogram but has not gotten any response.  She denies any breast tenderness, no enlarged lymph nodes in the axillary area, no skin changes and no nipple discharge.  She reports about 2 weeks of recurrent tooth pain in the left upper posterior to the area.  Pain is greatest with chewing.  Past Medical History:  Diagnosis Date  . Asthma   . Deaf   . Hypertension 2011  . NO (nasal obstruction)     Past Surgical History:  Procedure Laterality Date  . CESAREAN SECTION    . CHOLECYSTECTOMY    . TUBAL LIGATION      Family History  Problem Relation Age of Onset  . Heart disease Mother   . Stroke Mother     Social History   Socioeconomic History  . Marital status: Married    Spouse name: Not on file  . Number of children: 3   . Years of education: Not on file  . Highest education level: Not on file  Occupational History    Employer: UNEMPLOYED  Tobacco Use  . Smoking status: Never Smoker  . Smokeless tobacco: Never Used  Vaping Use  . Vaping Use: Never used  Substance and Sexual Activity  . Alcohol use: Yes    Comment: occassionally   . Drug use: No  . Sexual activity: Yes    Birth  control/protection: Surgical  Other Topics Concern  . Not on file  Social History Narrative  . Not on file   Social Determinants of Health   Financial Resource Strain:   . Difficulty of Paying Living Expenses: Not on file  Food Insecurity:   . Worried About Charity fundraiser in the Last Year: Not on file  . Ran Out of Food in the Last Year: Not on file  Transportation Needs:   . Lack of Transportation (Medical): Not on file  . Lack of Transportation (Non-Medical): Not on file  Physical Activity:   . Days of Exercise per Week: Not on file  . Minutes of Exercise per Session: Not on file  Stress:   . Feeling of Stress : Not on file  Social Connections:   . Frequency of Communication with Friends and Family: Not on file  . Frequency of Social Gatherings with Friends and Family: Not on file  . Attends Religious Services: Not on file  . Active Member of Clubs or Organizations: Not on file  . Attends Archivist Meetings: Not on file  . Marital Status: Not on file  Intimate Partner Violence:   . Fear of Current or Ex-Partner: Not on file  . Emotionally Abused: Not on file  .  Physically Abused: Not on file  . Sexually Abused: Not on file    Outpatient Medications Prior to Visit  Medication Sig Dispense Refill  . albuterol (VENTOLIN HFA) 108 (90 Base) MCG/ACT inhaler Inhale 2 puffs into the lungs every 6 (six) hours as needed for wheezing or shortness of breath. 18 g 3  . amLODipine (NORVASC) 10 MG tablet Take 1 tablet (10 mg total) by mouth daily. To lower blood pressure 90 tablet 1  . Blood Pressure Monitor KIT Use to check blood pressure daily 1 kit 0  . fluticasone (FLOVENT HFA) 110 MCG/ACT inhaler Inhale 2 puffs into the lungs 2 (two) times daily. 12 g 5  . loratadine (CLARITIN) 10 MG tablet Take 1 tablet (10 mg total) by mouth daily. As needed for allergy symptoms/congestion 90 tablet 3  . Misc. Devices MISC CPAP therapy on 17 cm H2O . Patient to use X-Small size  Resmed Full Face Mask AirFit F10. Diagnosis - Obstructive sleep apnea 1 each 0  . Olopatadine HCl 0.7 % SOLN 1 drop each eye daily as needed for itchy/watery/red eyes 2.5 mL 5  . omeprazole (PRILOSEC) 40 MG capsule Take 1 capsule (40 mg total) by mouth daily. To decrease stomach acid 180 capsule 0  . traZODone (DESYREL) 50 MG tablet Take 0.5-1 tablets (25-50 mg total) by mouth at bedtime as needed for sleep. 30 tablet 3  . triamcinolone (NASACORT) 55 MCG/ACT AERO nasal inhaler Place 2 sprays into the nose daily. 16.5 g 5  . valsartan-hydrochlorothiazide (DIOVAN-HCT) 320-25 MG tablet Take 1 tablet by mouth daily. 90 tablet 1  . Vitamin D, Ergocalciferol, (DRISDOL) 1.25 MG (50000 UNIT) CAPS capsule Take 1 capsule (50,000 Units total) by mouth every 7 (seven) days. (Patient not taking: Reported on 05/03/2020) 16 capsule 0   No facility-administered medications prior to visit.    Allergies  Allergen Reactions  . Eggs Or Egg-Derived Products Nausea And Vomiting  . Milk-Related Compounds     ROS Review of Systems  Constitutional: Negative for chills, fatigue and fever.  HENT: Positive for hearing loss. Negative for sore throat and trouble swallowing.   Eyes: Negative for photophobia and visual disturbance.  Respiratory: Negative for cough and shortness of breath.   Cardiovascular: Negative for chest pain and palpitations.  Gastrointestinal: Negative for abdominal pain, constipation, diarrhea and nausea.  Endocrine: Negative for polydipsia, polyphagia and polyuria.  Genitourinary: Negative for dysuria, frequency, menstrual problem, pelvic pain, vaginal discharge and vaginal pain.  Musculoskeletal: Negative for arthralgias and back pain.  Skin: Negative for rash and wound.  Neurological: Negative for dizziness and headaches.  Hematological: Negative for adenopathy. Does not bruise/bleed easily.  Psychiatric/Behavioral: Negative for sleep disturbance. The patient is not nervous/anxious.         Objective:    Physical Exam Vitals and nursing note reviewed. Exam conducted with a chaperone present.  Constitutional:      General: She is not in acute distress.    Appearance: Normal appearance.  HENT:     Mouth/Throat:     Comments: Patient with edema/erythema surrounding the last 2 left upper molars and patient with some decay of the next closest molar Neck:     Vascular: No carotid bruit.  Cardiovascular:     Rate and Rhythm: Normal rate.  Pulmonary:     Effort: Pulmonary effort is normal.     Breath sounds: Normal breath sounds. No wheezing.  Chest:     Breasts:        Right: Normal.  Left: Normal.  Abdominal:     Palpations: Abdomen is soft.     Tenderness: There is no abdominal tenderness. There is no right CVA tenderness, left CVA tenderness, guarding or rebound.  Genitourinary:    Comments: Normal appearance of the cervix.  No cervical motion tenderness.  No adnexal masses or tenderness on bimanual examination.  Very scant thin whitish discharge Musculoskeletal:        General: No tenderness.     Cervical back: Normal range of motion and neck supple. No rigidity or tenderness.     Right lower leg: No edema.     Left lower leg: No edema.  Lymphadenopathy:     Cervical: No cervical adenopathy.     Upper Body:     Right upper body: No axillary or pectoral adenopathy.     Left upper body: No axillary or pectoral adenopathy.  Skin:    General: Skin is warm and dry.  Neurological:     General: No focal deficit present.     Mental Status: She is alert and oriented to person, place, and time.  Psychiatric:        Mood and Affect: Mood normal.        Behavior: Behavior normal.     BP (!) 154/83 (BP Location: Right Arm, Patient Position: Sitting)   Pulse 83   Wt 196 lb 12.8 oz (89.3 kg)   LMP 05/08/2016   SpO2 100%   BMI 36.00 kg/m  Wt Readings from Last 3 Encounters:  07/04/20 196 lb 12.8 oz (89.3 kg)  02/02/20 199 lb 12.8 oz (90.6 kg)  01/11/20 189  lb (85.7 kg)     Health Maintenance Due  Topic Date Due  . Hepatitis C Screening  Never done  . COVID-19 Vaccine (1) Never done  . PAP SMEAR-Modifier  05/01/2017  . INFLUENZA VACCINE  03/18/2020   Patient declined influenza immunization.  She has had prior colonoscopy.  Lab Results  Component Value Date   TSH 1.600 01/05/2020   Lab Results  Component Value Date   WBC 6.3 01/05/2020   HGB 12.7 01/05/2020   HCT 38.6 01/05/2020   MCV 90 01/05/2020   PLT 384 01/05/2020   Lab Results  Component Value Date   NA 141 01/05/2020   K 4.5 01/05/2020   CO2 25 01/05/2020   GLUCOSE 100 (H) 01/05/2020   BUN 21 01/05/2020   CREATININE 0.92 01/05/2020   BILITOT 0.3 10/27/2019   ALKPHOS 92 10/27/2019   AST 20 10/27/2019   ALT 18 10/27/2019   PROT 7.5 10/27/2019   ALBUMIN 4.4 10/27/2019   CALCIUM 9.9 01/05/2020   ANIONGAP 8 06/10/2016   Lab Results  Component Value Date   CHOL 200 (H) 11/05/2018   Lab Results  Component Value Date   HDL 35 (L) 11/05/2018   Lab Results  Component Value Date   LDLCALC 146 (H) 11/05/2018   Lab Results  Component Value Date   TRIG 96 11/05/2018   Lab Results  Component Value Date   CHOLHDL 5.7 (H) 11/05/2018   Lab Results  Component Value Date   HGBA1C 5.5 10/27/2019      Assessment & Plan:  1. Well female exam with routine gynecological exam; 2.  Screening for cervical cancer; 3.  Screening for breast cancer Patient with well female exam at today's visit.  Patient with normal breast exam and normal appearance to the cervix.  Patient had Pap smear as a screening test for cervical cancer  and will be scheduled for mammogram as a screening test for breast cancer.  Information on health maintenance and cancer screening provided as part of after visit summary. - MM Digital Screening; Future - Cytology - PAP  4. Tooth infection Patient with edema and mild erythema of the gum surrounding the left last posterior upper molar and likely has  a tooth infection.  Prescription provided for amoxicillin twice daily x10 days and she is encouraged to follow-up with a dentist. - amoxicillin (AMOXIL) 500 MG capsule; Take 1 capsule (500 mg total) by mouth 2 (two) times daily for 10 days.  Dispense: 20 capsule; Refill: 0  5. Urinary urgency Patient with urinary urgency as she will have a urinalysis and urine culture at today's visit. - POCT URINALYSIS DIP (CLINITEK) - Urine Culture  6. Screening for STDs (sexually transmitted diseases) Cervicovaginal ancillary testing done as a screening test for sexually transmitted diseases and she will be notified of the results and if any further treatment is needed based on these results. - Cervicovaginal ancillary only   Follow-up: Return in about 8 weeks (around 08/29/2020) for chronic medical issues- sooner if needed.    Antony Blackbird, MD

## 2020-07-04 NOTE — Progress Notes (Signed)
ANNUAL EXAM  Acne bump on face that sore

## 2020-07-05 ENCOUNTER — Other Ambulatory Visit: Payer: Self-pay | Admitting: Family Medicine

## 2020-07-05 DIAGNOSIS — N760 Acute vaginitis: Secondary | ICD-10-CM

## 2020-07-05 DIAGNOSIS — B9689 Other specified bacterial agents as the cause of diseases classified elsewhere: Secondary | ICD-10-CM

## 2020-07-05 LAB — CERVICOVAGINAL ANCILLARY ONLY
Bacterial Vaginitis (gardnerella): POSITIVE — AB
Candida Glabrata: NEGATIVE
Candida Vaginitis: NEGATIVE
Chlamydia: NEGATIVE
Comment: NEGATIVE
Comment: NEGATIVE
Comment: NEGATIVE
Comment: NEGATIVE
Comment: NEGATIVE
Comment: NORMAL
Neisseria Gonorrhea: NEGATIVE
Trichomonas: NEGATIVE

## 2020-07-05 MED ORDER — METRONIDAZOLE 500 MG PO TABS: 500.0000 mg | ORAL_TABLET | Freq: Two times a day (BID) | ORAL | 0 refills | Status: AC

## 2020-07-06 LAB — URINE CULTURE: Organism ID, Bacteria: NO GROWTH

## 2020-07-09 LAB — CYTOLOGY - PAP
Comment: NEGATIVE
Diagnosis: NEGATIVE
High risk HPV: NEGATIVE

## 2020-08-02 ENCOUNTER — Ambulatory Visit: Payer: Medicaid Other | Admitting: Allergy

## 2020-08-23 ENCOUNTER — Encounter (HOSPITAL_COMMUNITY): Payer: Self-pay | Admitting: Emergency Medicine

## 2020-08-23 ENCOUNTER — Ambulatory Visit (HOSPITAL_COMMUNITY)
Admission: EM | Admit: 2020-08-23 | Discharge: 2020-08-23 | Disposition: A | Discharge status: Home / Self Care | Payer: Medicaid Other | Attending: Family Medicine | Admitting: Family Medicine

## 2020-08-23 DIAGNOSIS — K0889 Other specified disorders of teeth and supporting structures: Secondary | ICD-10-CM

## 2020-08-23 MED ORDER — HYDROCODONE-ACETAMINOPHEN 5-325 MG PO TABS: 1.0000 | ORAL_TABLET | Freq: Four times a day (QID) | ORAL | 0 refills | Status: DC | PRN

## 2020-08-23 MED ORDER — CLINDAMYCIN HCL 300 MG PO CAPS
300.0000 mg | ORAL_CAPSULE | Freq: Three times a day (TID) | ORAL | 0 refills | Status: DC
Start: 2020-08-23 — End: 2021-04-25

## 2020-08-23 NOTE — ED Notes (Signed)
Pt stated the medication she took for her oral swelling was not actually prescribed to her, it was leftover medication that a friend had; pt cannot remember name of medication.  antibiotic

## 2020-08-23 NOTE — Discharge Instructions (Signed)

## 2020-08-23 NOTE — ED Triage Notes (Signed)
Pt presents with left side oral swelling X 2 days.   Pt was prescribed medication on Monday to help with infection but swelling 7 pain is progressing.

## 2020-08-28 NOTE — ED Provider Notes (Signed)
[facility]   [id number] 08/23/20 Arrival Time: 1458  ASSESSMENT & PLAN:  1. Pain, dental    No sign of abscess requiring I&D at this time. Discussed.  Begin: Meds ordered this encounter  Medications  . clindamycin (CLEOCIN) 300 MG capsule    Sig: Take 1 capsule (300 mg total) by mouth 3 (three) times daily.    Dispense:  30 capsule    Refill:  0  . HYDROcodone-acetaminophen (NORCO/VICODIN) 5-325 MG tablet    Sig: Take 1 tablet by mouth every 6 (six) hours as needed for moderate pain or severe pain.    Dispense:  8 tablet    Refill:  0    [place] Controlled Substances Registry consulted for this patient. I feel the risk/benefit ratio today is favorable for proceeding with this prescription for a controlled substance. Medication sedation precautions given.  Dental resource written instructions given. She will schedule dental evaluation as soon as possible if not improving over the next 24-48 hours.  Reviewed expectations re: course of current medical issues. Questions answered. Outlined signs and symptoms indicating need for more acute intervention. Patient verbalized understanding. After Visit Summary given.   SUBJECTIVE:  Astrid Vides is a 55 y.o. female who reports abrupt onset of left upper dental pain described as aching/throbbing. Present for 2 days. Fever: absent. Tolerating PO intake but reports pain with chewing. Normal swallowing. She does not see a dentist regularly. No neck swelling or pain. OTC analgesics without relief. Has been taking unknown antibiotic for a few days; does not think helping.  ROS: As per HPI.  OBJECTIVE: General appearance: alert; no distress HENT: normocephalic; atraumatic; dentition: fair; left upper gums without areas of fluctuance, drainage, or bleeding and with tenderness to palpation; normal jaw movement without difficulty Neck: supple without LAD; FROM; trachea midline Lungs: normal respirations; unlabored; speaks full  sentences without difficulty Skin: warm and dry Psychological: alert and cooperative; normal mood and affect  Allergies  Allergen Reactions  . Eggs Or Egg-Derived Products Nausea And Vomiting  . Milk-Related Compounds     Past Medical History:  Diagnosis Date  . Asthma   . Deaf   . Hypertension 2011  . NO (nasal obstruction)    Social History   Socioeconomic History  . Marital status: Married    Spouse name: Not on file  . Number of children: 3   . Years of education: Not on file  . Highest education level: Not on file  Occupational History    Employer: UNEMPLOYED  Tobacco Use  . Smoking status: Never Smoker  . Smokeless tobacco: Never Used  Vaping Use  . Vaping Use: Never used  Substance and Sexual Activity  . Alcohol use: Yes    Comment: occassionally   . Drug use: No  . Sexual activity: Yes    Birth control/protection: Surgical  Other Topics Concern  . Not on file  Social History Narrative  . Not on file   Social Determinants of Health   Financial Resource Strain: Not on file  Food Insecurity: Not on file  Transportation Needs: Not on file  Physical Activity: Not on file  Stress: Not on file  Social Connections: Not on file  Intimate Partner Violence: Not on file   Family History  Problem Relation Age of Onset  . Heart disease Mother   . Stroke Mother    Past Surgical History:  Procedure Laterality Date  . CESAREAN SECTION    . CHOLECYSTECTOMY    . TUBAL  Mauricia Area, MD 08/28/20 [phone]

## 2020-09-12 ENCOUNTER — Telehealth: Payer: Self-pay | Admitting: Family Medicine

## 2020-09-12 NOTE — Telephone Encounter (Signed)
Former patient of Caitlin [name] called to request a referral for a breast specialist.  She stated she is having some issues with her breasts and needs to see a specialist.  Please advise and call patient to discuss at [phone]

## 2020-09-12 NOTE — Telephone Encounter (Signed)
Attempt to call patient - to schedule an appt for f/u.  Used [employer]- Interpreter- [phone]  Left message on voicemail to return call.  Can call [phone] for a sooner appt for evaluation. Also left phone for [facility].   Please advise otherwise if referral can be placed.

## 2020-09-14 NOTE — Telephone Encounter (Signed)
2nd attempt to call patient-  [employer], Relay call was placed.  Interpreter- [phone] assisted with call.   LVM- patient to call [phone] to schedule an appt at [facility] at [facility] to address her concerns.

## 2020-09-14 NOTE — Telephone Encounter (Signed)
Unfortunately more information will be required about 'issues with her breast'.  This will require an office visit prior to referral being placed.

## 2020-09-17 ENCOUNTER — Ambulatory Visit: Payer: Medicaid Other | Admitting: Physician Assistant

## 2020-09-25 ENCOUNTER — Other Ambulatory Visit: Payer: Self-pay

## 2020-09-25 ENCOUNTER — Encounter: Payer: Self-pay | Admitting: Family

## 2020-09-25 ENCOUNTER — Other Ambulatory Visit: Payer: Self-pay | Admitting: Family

## 2020-09-25 ENCOUNTER — Ambulatory Visit (INDEPENDENT_AMBULATORY_CARE_PROVIDER_SITE_OTHER): Payer: Medicaid Other | Admitting: Family

## 2020-09-25 VITALS — BP 163/89 | HR 68 | Wt 193.2 lb

## 2020-09-25 DIAGNOSIS — N644 Mastodynia: Secondary | ICD-10-CM | POA: Diagnosis not present

## 2020-09-25 DIAGNOSIS — Z789 Other specified health status: Secondary | ICD-10-CM

## 2020-09-25 DIAGNOSIS — Z1231 Encounter for screening mammogram for malignant neoplasm of breast: Secondary | ICD-10-CM | POA: Diagnosis not present

## 2020-09-25 DIAGNOSIS — Z23 Encounter for immunization: Secondary | ICD-10-CM | POA: Diagnosis not present

## 2020-09-25 MED ORDER — MELOXICAM 7.5 MG PO TABS: 7.5000 mg | ORAL_TABLET | Freq: Every day | ORAL | 0 refills | Status: DC

## 2020-09-25 MED ORDER — MELOXICAM 7.5 MG PO TABS
7.5000 mg | ORAL_TABLET | Freq: Every day | ORAL | 0 refills | Status: DC
Start: 2020-09-25 — End: 2020-09-28

## 2020-09-25 NOTE — Progress Notes (Signed)
Breast soreness w/cramping  Started about month ago when rcvd COVID vaccine Is not here to establish care no available appts at [facility]

## 2020-09-25 NOTE — Progress Notes (Signed)
Patient ID: Caitlin [name], female    DOB: 1965/12/04  MRN: [id number]  CC: Breast Problem  Subjective: Caitlin [name] is a 55 y.o. female who presents for breast pain.  1. BREAST PAIN:  Duration :months Location: bilateral Onset: sudden Severity: 7/10 Quality: sharp and cramping Frequency: intermittent Redness: no Swelling: no Trauma: no trauma Nipple discharge: no Breast lump: no Status: stable Treatments attempted: tylenol Previous mammogram: 03/25/2019   Patient Active Problem List   Diagnosis Date Noted  . Viral URI with cough 09/28/2015  . Obesity (BMI 30.0-34.9) 11/20/2014  . Menopausal symptoms 07/27/2014  . Hair loss 05/01/2014  . Onychomycosis 05/01/2014  . HTN (hypertension) 03/06/2012  . GERD (gastroesophageal reflux disease) 03/06/2012     Current Outpatient Medications on File Prior to Visit  Medication Sig Dispense Refill  . albuterol (VENTOLIN HFA) 108 (90 Base) MCG/ACT inhaler Inhale 2 puffs into the lungs every 6 (six) hours as needed for wheezing or shortness of breath. 18 g 3  . amLODipine (NORVASC) 10 MG tablet Take 1 tablet (10 mg total) by mouth daily. To lower blood pressure 90 tablet 1  . Blood Pressure Monitor KIT Use to check blood pressure daily 1 kit 0  . clindamycin (CLEOCIN) 300 MG capsule Take 1 capsule (300 mg total) by mouth 3 (three) times daily. 30 capsule 0  . fluticasone (FLOVENT HFA) 110 MCG/ACT inhaler Inhale 2 puffs into the lungs 2 (two) times daily. 12 g 5  . HYDROcodone-acetaminophen (NORCO/VICODIN) 5-325 MG tablet Take 1 tablet by mouth every 6 (six) hours as needed for moderate pain or severe pain. 8 tablet 0  . loratadine (CLARITIN) 10 MG tablet Take 1 tablet (10 mg total) by mouth daily. As needed for allergy symptoms/congestion 90 tablet 3  . Misc. Devices MISC CPAP therapy on 17 cm H2O . Patient to use X-Small size Resmed Full Face Mask AirFit F10. Diagnosis - Obstructive sleep apnea 1 each 0  . Olopatadine HCl 0.7 %  SOLN 1 drop each eye daily as needed for itchy/watery/red eyes 2.5 mL 5  . omeprazole (PRILOSEC) 40 MG capsule Take 1 capsule (40 mg total) by mouth daily. To decrease stomach acid 180 capsule 0  . traZODone (DESYREL) 50 MG tablet Take 0.5-1 tablets (25-50 mg total) by mouth at bedtime as needed for sleep. 30 tablet 3  . triamcinolone (NASACORT) 55 MCG/ACT AERO nasal inhaler Place 2 sprays into the nose daily. 16.5 g 5  . valsartan-hydrochlorothiazide (DIOVAN-HCT) 320-25 MG tablet Take 1 tablet by mouth daily. 90 tablet 1  . Vitamin D, Ergocalciferol, (DRISDOL) 1.25 MG (50000 UNIT) CAPS capsule Take 1 capsule (50,000 Units total) by mouth every 7 (seven) days. (Patient not taking: No sig reported) 16 capsule 0   No current facility-administered medications on file prior to visit.    Allergies  Allergen Reactions  . Eggs Or Egg-Derived Products Nausea And Vomiting  . Milk-Related Compounds     Social History   Socioeconomic History  . Marital status: Married    Spouse name: Not on file  . Number of children: 3   . Years of education: Not on file  . Highest education level: Not on file  Occupational History    Employer: UNEMPLOYED  Tobacco Use  . Smoking status: Never Smoker  . Smokeless tobacco: Never Used  Vaping Use  . Vaping Use: Never used  Substance and Sexual Activity  . Alcohol use: Yes    Comment: occassionally   . Drug use:  No  . Sexual activity: Yes    Birth control/protection: Surgical  Other Topics Concern  . Not on file  Social History Narrative  . Not on file   Social Determinants of Health   Financial Resource Strain: Not on file  Food Insecurity: Not on file  Transportation Needs: Not on file  Physical Activity: Not on file  Stress: Not on file  Social Connections: Not on file  Intimate Partner Violence: Not on file    Family History  Problem Relation Age of Onset  . Heart disease Mother   . Stroke Mother     Past Surgical History:  Procedure  Laterality Date  . CESAREAN SECTION    . CHOLECYSTECTOMY    . TUBAL LIGATION      ROS: Review of Systems Negative except as stated above  PHYSICAL EXAM: BP (!) 163/89 (BP Location: Left Arm, Patient Position: Sitting)   Pulse 68   Wt 193 lb 3.2 oz (87.6 kg)   LMP 05/08/2016   SpO2 98%   BMI 35.34 kg/m   Physical Exam HENT:     Head: Normocephalic.  Eyes:     Extraocular Movements: Extraocular movements intact.     Pupils: Pupils are equal, round, and reactive to light.  Cardiovascular:     Rate and Rhythm: Normal rate and regular rhythm.     Pulses: Normal pulses.     Heart sounds: Normal heart sounds.  Pulmonary:     Effort: Pulmonary effort is normal.     Breath sounds: Normal breath sounds.  Chest:     Comments: Patient declined examination.  Musculoskeletal:     Cervical back: Normal range of motion and neck supple.  Neurological:     General: No focal deficit present.     Mental Status: She is alert and oriented to person, place, and time.  Psychiatric:        Mood and Affect: Mood normal.        Behavior: Behavior normal.     ASSESSMENT AND PLAN: 1. Breast pain: - Chronic. - Meloxicam as prescribed. Continue with over-the-counter Acetaminophen.  - Follow-up with primary provider as needed.  - meloxicam (MOBIC) 7.5 MG tablet; Take 1 tablet (7.5 mg total) by mouth daily.  Dispense: 30 tablet; Refill: 0  2. Encounter for screening mammogram for malignant neoplasm of breast: - Referral for breast cancer screening via mammogram. - MM Digital Screening; Future  3. Need for immunization against influenza: - Flu vaccine administered..  - Flu Vaccine QUAD 36+ mos IM  4. Language barrier: - Patient accompanied by [facility] interpreter, named Barnetta Chapel.   Patient was given the opportunity to ask questions.  Patient verbalized understanding of the plan and was able to repeat key elements of the plan. Patient was given clear instructions to go to Emergency  Department or return to medical center if symptoms don't improve, worsen, or new problems develop.The patient verbalized understanding.   Orders Placed This Encounter  Procedures  . MM Digital Screening  . Flu Vaccine QUAD 36+ mos IM     Requested Prescriptions   Signed Prescriptions Disp Refills  . meloxicam (MOBIC) 7.5 MG tablet 30 tablet 0    Sig: Take 1 tablet (7.5 mg total) by mouth daily.    No follow-ups on file.  Camillia Herter, NP

## 2020-09-25 NOTE — Patient Instructions (Signed)
   Referral for mammogram.   Meloxicam for breast pain. Alternate with Tylenol.   Follow-up with primary provider as needed.   Mammogram A mammogram is an X-ray of the breasts. This is done to check for changes that are not normal. This test can look for changes that may be caused by breast cancer or other problems. Mammograms are regularly done on women beginning at age 55. A man may have a mammogram if he has a lump or swelling in his breast. Tell a doctor:  About any allergies you have.  If you have breast implants.  If you have had breast disease, biopsy, or surgery.  If you have a family history of breast cancer.  If you are breastfeeding.  Whether you are pregnant or may be pregnant. What are the risks? Generally, this is a safe procedure. But problems may occur, including:  Being exposed to radiation. Radiation levels are very low with this test.  The need for more tests.  The results were not read properly.  Trouble finding breast cancer in women with dense breasts. What happens before the test?  Have this test done about 1-2 weeks after your menstrual period. This is often when your breasts are the least tender.  If you are visiting a new doctor or clinic, have any past mammogram images sent to your new doctor's office.  Wash your breasts and under your arms on the day of the test.  Do not use deodorants, perfumes, lotions, or powders on the day of the test.  Take off any jewelry from your neck.  Wear clothes that you can change into and out of easily. What happens during the test?  You will take off your clothes from the waist up. You will put on a gown.  You will stand in front of the X-ray machine.  Each breast will be placed between two plastic or glass plates. The plates will press down on your breast for a few seconds. Try to relax. This does not cause any harm to your breasts. It may not feel comfortable, but it will be very brief.  X-rays will be  taken from different angles of each breast. The procedure may vary among doctors and hospitals.   What can I expect after the test?  The mammogram will be read by a specialist (radiologist).  You may need to do parts of the test again. This depends on the quality of the images.  You may go back to your normal activities.  It is up to you to get the results of your test. Ask how to get your results when they are ready. Summary  A mammogram is an X-ray of the breasts. It looks for changes that may be caused by breast cancer or other problems.  A man may have this test if he has a lump or swelling in his breast.  Before the test, tell your doctor about any breast problems that you have had in the past.  Have this test done about 1-2 weeks after your menstrual period.  Ask when your test results will be ready. Make sure you get your test results. This information is not intended to replace advice given to you by your health care provider. Make sure you discuss any questions you have with your health care provider. Document Revised: 06/04/2020 Document Reviewed: 06/04/2020 Elsevier Patient Education  2021 [employer].

## 2020-09-28 ENCOUNTER — Telehealth: Payer: Self-pay | Admitting: Family

## 2020-09-28 ENCOUNTER — Other Ambulatory Visit: Payer: Self-pay

## 2020-09-28 DIAGNOSIS — N644 Mastodynia: Secondary | ICD-10-CM

## 2020-09-28 MED ORDER — MELOXICAM 7.5 MG PO TABS: 7.5000 mg | ORAL_TABLET | Freq: Every day | ORAL | 0 refills | Status: DC

## 2020-09-28 NOTE — Telephone Encounter (Signed)
PT informed MELOXICAM was sent to an incorrect pharmacy. Pt is asking if it can be forwarded to [facility] on [address], [place], [place] [address]. Please advise and thank you

## 2020-09-28 NOTE — Progress Notes (Signed)
Rx sent to incorrect pharmacy

## 2020-11-27 ENCOUNTER — Other Ambulatory Visit: Payer: Self-pay | Admitting: Pharmacist

## 2020-11-27 MED ORDER — VALSARTAN-HYDROCHLOROTHIAZIDE 320-25 MG PO TABS: 1.0000 | ORAL_TABLET | Freq: Every day | ORAL | 0 refills | Status: DC

## 2021-02-12 ENCOUNTER — Ambulatory Visit
Admission: RE | Admit: 2021-02-12 | Discharge: 2021-02-12 | Disposition: A | Discharge status: Home / Self Care | Payer: Medicaid Other | Source: Ambulatory Visit | Attending: Family | Admitting: Family

## 2021-02-12 ENCOUNTER — Other Ambulatory Visit: Payer: Self-pay

## 2021-02-12 DIAGNOSIS — Z1231 Encounter for screening mammogram for malignant neoplasm of breast: Secondary | ICD-10-CM

## 2021-02-14 NOTE — Progress Notes (Signed)
Please call patient with update.   No evidence of breast malignancy.   Repeat mammogram in 1 year.

## 2021-04-25 ENCOUNTER — Other Ambulatory Visit: Payer: Self-pay

## 2021-04-25 ENCOUNTER — Encounter: Payer: Self-pay | Admitting: Internal Medicine

## 2021-04-25 ENCOUNTER — Ambulatory Visit: Payer: Medicaid Other | Attending: Internal Medicine | Admitting: Internal Medicine

## 2021-04-25 VITALS — BP 171/97 | HR 60 | Resp 16 | Wt 195.0 lb

## 2021-04-25 DIAGNOSIS — H9190 Unspecified hearing loss, unspecified ear: Secondary | ICD-10-CM | POA: Diagnosis not present

## 2021-04-25 DIAGNOSIS — K219 Gastro-esophageal reflux disease without esophagitis: Secondary | ICD-10-CM | POA: Insufficient documentation

## 2021-04-25 DIAGNOSIS — R1012 Left upper quadrant pain: Secondary | ICD-10-CM | POA: Diagnosis not present

## 2021-04-25 DIAGNOSIS — Z6835 Body mass index (BMI) 35.0-35.9, adult: Secondary | ICD-10-CM | POA: Diagnosis not present

## 2021-04-25 DIAGNOSIS — Z560 Unemployment, unspecified: Secondary | ICD-10-CM | POA: Insufficient documentation

## 2021-04-25 DIAGNOSIS — E669 Obesity, unspecified: Secondary | ICD-10-CM | POA: Insufficient documentation

## 2021-04-25 DIAGNOSIS — B372 Candidiasis of skin and nail: Secondary | ICD-10-CM

## 2021-04-25 DIAGNOSIS — R1013 Epigastric pain: Secondary | ICD-10-CM

## 2021-04-25 DIAGNOSIS — Z791 Long term (current) use of non-steroidal anti-inflammatories (NSAID): Secondary | ICD-10-CM | POA: Insufficient documentation

## 2021-04-25 DIAGNOSIS — Z7951 Long term (current) use of inhaled steroids: Secondary | ICD-10-CM | POA: Diagnosis not present

## 2021-04-25 DIAGNOSIS — Z760 Encounter for issue of repeat prescription: Secondary | ICD-10-CM | POA: Diagnosis not present

## 2021-04-25 DIAGNOSIS — I10 Essential (primary) hypertension: Secondary | ICD-10-CM

## 2021-04-25 DIAGNOSIS — J4520 Mild intermittent asthma, uncomplicated: Secondary | ICD-10-CM | POA: Diagnosis not present

## 2021-04-25 MED ORDER — AMLODIPINE BESYLATE 10 MG PO TABS: 10.0000 mg | ORAL_TABLET | Freq: Every day | ORAL | 1 refills | Status: DC

## 2021-04-25 MED ORDER — OMEPRAZOLE 20 MG PO CPDR: 20.0000 mg | DELAYED_RELEASE_CAPSULE | Freq: Every day | ORAL | 3 refills | Status: DC

## 2021-04-25 MED ORDER — VALSARTAN-HYDROCHLOROTHIAZIDE 320-25 MG PO TABS: 1.0000 | ORAL_TABLET | Freq: Every day | ORAL | 1 refills | Status: DC

## 2021-04-25 MED ORDER — VALSARTAN-HYDROCHLOROTHIAZIDE 320-25 MG PO TABS
1.0000 | ORAL_TABLET | Freq: Every day | ORAL | 1 refills | Status: DC
Start: 2021-04-25 — End: 2021-04-25

## 2021-04-25 MED ORDER — ALBUTEROL SULFATE HFA 108 (90 BASE) MCG/ACT IN AERS: 2.0000 | INHALATION_SPRAY | Freq: Four times a day (QID) | RESPIRATORY_TRACT | 3 refills | Status: DC | PRN

## 2021-04-25 MED ORDER — NYSTATIN 100000 UNIT/GM EX POWD: 1.0000 "application " | Freq: Two times a day (BID) | CUTANEOUS | 0 refills | Status: DC

## 2021-04-25 NOTE — Progress Notes (Signed)
Patient ID: Caitlin [name], female    DOB: 1966/03/10  MRN: [id number]  CC: Medication Refill and Hypertension   Subjective: Caitlin [name] is a 55 y.o. female who presents with several concerns.  PCP is NP Minette Brine.  Patient is hearing impaired.   Sign Language interpreter with [employer] video used. Her concerns today include:  Pt with hx of HTN, GERD, obesity, asthma  Pt c/o pain on LT side of  abdomen x 4 wks Occurs soon after meals and when she exercises.  Occurs within 5 mins of eating and can last 20 mins. Feels "like a bloating and sharp pain."  Gets feeling of being full very quickly.  No unexplained wgh loss. Trying to lose wgh She does not associate her symptoms with any particular types of foods.  However she does endorse bloating with milk.  She has a history of GERD.  She was on omeprazole in the past.  She is not sure whether she still has some at home or not. No associated N/V.  Had some diarrhea 2 wks ago that lasted a wk.  Did not take any thing for the diarrhea at the time.  No blood in the stools.  BM have returned to normal.   HTN:  BP noted to be elevated today.  She takes Diovan/HCTZ and Norvasc. Reports having taken them already this a.m.  Bottle of Diovan/HCTZ that she has with her was  written 11/27/20 and filled around that time for 90 day supply and Norvasc bottle date is 07/18/2018.  She endorses taking both medications regularly without skipping doses.  I told her that based on the fill dates on these bottles, she should have been out a while ago.  However she insists that she is taking both consistently. -she limits salt in foods  C/o Rash under breast x 1 mth.  Itchy.  Sweats a lot from hot flashes  Asthma: Requests refill on albuterol inhaler.  Patient Active Problem List   Diagnosis Date Noted   Viral URI with cough 09/28/2015   Obesity (BMI 30.0-34.9) 11/20/2014   Menopausal symptoms 07/27/2014   Hair loss 05/01/2014   Onychomycosis 05/01/2014   HTN  (hypertension) 03/06/2012   GERD (gastroesophageal reflux disease) 03/06/2012     Current Outpatient Medications on File Prior to Visit  Medication Sig Dispense Refill   Blood Pressure Monitor KIT Use to check blood pressure daily 1 kit 0   fluticasone (FLOVENT HFA) 110 MCG/ACT inhaler Inhale 2 puffs into the lungs 2 (two) times daily. 12 g 5   loratadine (CLARITIN) 10 MG tablet Take 1 tablet (10 mg total) by mouth daily. As needed for allergy symptoms/congestion 90 tablet 3   meloxicam (MOBIC) 7.5 MG tablet TAKE 1 TABLET (7.5 MG TOTAL) BY MOUTH DAILY. 30 tablet 0   Misc. Devices MISC CPAP therapy on 17 cm H2O . Patient to use X-Small size Resmed Full Face Mask AirFit F10. Diagnosis - Obstructive sleep apnea 1 each 0   Olopatadine HCl 0.7 % SOLN 1 drop each eye daily as needed for itchy/watery/red eyes 2.5 mL 5   traZODone (DESYREL) 50 MG tablet Take 0.5-1 tablets (25-50 mg total) by mouth at bedtime as needed for sleep. 30 tablet 3   triamcinolone (NASACORT) 55 MCG/ACT AERO nasal inhaler Place 2 sprays into the nose daily. 16.5 g 5   Vitamin D, Ergocalciferol, (DRISDOL) 1.25 MG (50000 UNIT) CAPS capsule Take 1 capsule (50,000 Units total) by mouth every 7 (seven) days. (Patient  not taking: No sig reported) 16 capsule 0   No current facility-administered medications on file prior to visit.    Allergies  Allergen Reactions   Eggs Or Egg-Derived Products Nausea And Vomiting   Milk-Related Compounds     Social History   Socioeconomic History   Marital status: Married    Spouse name: Not on file   Number of children: 3    Years of education: Not on file   Highest education level: Not on file  Occupational History    Employer: UNEMPLOYED  Tobacco Use   Smoking status: Never   Smokeless tobacco: Never  Vaping Use   Vaping Use: Never used  Substance and Sexual Activity   Alcohol use: Yes    Comment: occassionally    Drug use: No   Sexual activity: Yes    Birth  control/protection: Surgical  Other Topics Concern   Not on file  Social History Narrative   Not on file   Social Determinants of Health   Financial Resource Strain: Not on file  Food Insecurity: Not on file  Transportation Needs: Not on file  Physical Activity: Not on file  Stress: Not on file  Social Connections: Not on file  Intimate Partner Violence: Not on file    Family History  Problem Relation Age of Onset   Heart disease Mother    Stroke Mother     Past Surgical History:  Procedure Laterality Date   CESAREAN SECTION     CHOLECYSTECTOMY     TUBAL LIGATION      ROS: Review of Systems Negative except as stated above  PHYSICAL EXAM: BP (!) 171/97   Pulse 60   Resp 16   Wt 195 lb (88.5 kg)   LMP 05/08/2016   SpO2 100%   BMI 35.67 kg/m   Wt Readings from Last 3 Encounters:  04/25/21 195 lb (88.5 kg)  09/25/20 193 lb 3.2 oz (87.6 kg)  07/04/20 196 lb 12.8 oz (89.3 kg)   Repeat BP 181/100 Physical Exam  General appearance - alert, well appearing, middle-aged African-American female and in no distress Mental status - normal mood, behavior, speech, dress, motor activity, and thought processes Chest - clear to auscultation, no wheezes, rales or rhonchi, symmetric air entry Heart - normal rate, regular rhythm, normal S1, S2, no murmurs, rubs, clicks or gallops Abdomen - soft, non-distended, no masses or organomegaly.  Slight tenderness in the lateral aspect of the left upper abdomen.  No guarding or rebound.  No flank tenderness on the left side. Skin -skin under both breasts are moist.  She has a hyperpigmented rash under the breasts.   CMP Latest Ref Rng & Units 01/05/2020 10/27/2019 06/16/2019  Glucose 65 - 99 mg/dL 100(H) 97 105(H)  BUN 6 - 24 mg/dL '21 17 24  ' Creatinine 0.57 - 1.00 mg/dL 0.92 0.98 0.92  Sodium 134 - 144 mmol/L 141 144 139  Potassium 3.5 - 5.2 mmol/L 4.5 4.1 4.3  Chloride 96 - 106 mmol/L 98 104 100  CO2 20 - 29 mmol/L '25 27 25  ' Calcium  8.7 - 10.2 mg/dL 9.9 10.1 9.8  Total Protein 6.0 - 8.5 g/dL - 7.5 7.7  Total Bilirubin 0.0 - 1.2 mg/dL - 0.3 0.4  Alkaline Phos 39 - 117 IU/L - 92 80  AST 0 - 40 IU/L - 20 16  ALT 0 - 32 IU/L - 18 17   Lipid Panel     Component Value Date/Time   CHOL 200 (H)  11/05/2018 1007   TRIG 96 11/05/2018 1007   HDL 35 (L) 11/05/2018 1007   CHOLHDL 5.7 (H) 11/05/2018 1007   CHOLHDL 4.0 03/06/2012 0542   VLDL 7 03/06/2012 0542   LDLCALC 146 (H) 11/05/2018 1007    CBC    Component Value Date/Time   WBC 6.3 01/05/2020 0924   WBC 10.6 06/10/2016 0307   RBC 4.31 01/05/2020 0924   RBC 4.39 06/10/2016 0307   HGB 12.7 01/05/2020 0924   HCT 38.6 01/05/2020 0924   PLT 384 01/05/2020 0924   MCV 90 01/05/2020 0924   MCV 90 05/28/2012 1937   MCH 29.5 01/05/2020 0924   MCH 30.1 06/10/2016 0307   MCHC 32.9 01/05/2020 0924   MCHC 34.3 06/10/2016 0307   RDW 12.4 01/05/2020 0924   RDW 13.1 05/28/2012 1937   LYMPHSABS 2.0 06/16/2019 1012   MONOABS 0.3 01/10/2016 1052   EOSABS 0.1 06/16/2019 1012   BASOSABS 0.0 06/16/2019 1012    ASSESSMENT AND PLAN:  1. Dyspepsia We will do an H. pylori breath test today.  I have refilled Prilosec and recommend that she takes 1 daily.  GERD precautions given. Advised to avoid foods that causes increased bloating for her including dairy products. - H. pylori breath test - omeprazole (PRILOSEC) 20 MG capsule; Take 1 capsule (20 mg total) by mouth daily. To decrease stomach acid  Dispense: 30 capsule; Refill: 3  2. Essential hypertension Not at goal.  I do not think she has been taking her medicines consistently based on the last fill dates on the bottle/she has.  Advised to take medicines every day.  Continue to limit salt in the foods.  Follow-up with our clinical pharmacist in 1 week for repeat blood pressure check. - CBC - Comprehensive metabolic panel - Lipid panel - amLODipine (NORVASC) 10 MG tablet; Take 1 tablet (10 mg total) by mouth daily. To lower  blood pressure  Dispense: 90 tablet; Refill: 1 - valsartan-hydrochlorothiazide (DIOVAN-HCT) 320-25 MG tablet; Take 1 tablet by mouth daily.  Dispense: 90 tablet; Refill: 1  3. Intertriginous candidiasis Advised to keep the skin under the breasts clean and dry. - nystatin (MYCOSTATIN/NYSTOP) powder; Apply 1 application topically 2 (two) times daily. Apply to affected area under breast  Dispense: 15 g; Refill: 0  4. Mild intermittent asthma without complication Patient requested refill on albuterol inhaler - albuterol (VENTOLIN HFA) 108 (90 Base) MCG/ACT inhaler; Inhale 2 puffs into the lungs every 6 (six) hours as needed for wheezing or shortness of breath.  Dispense: 18 g; Refill: 3  5. Left upper quadrant abdominal pain See #1 above.   Patient was given the opportunity to ask questions.  Patient verbalized understanding of the plan and was able to repeat key elements of the plan.  [employer] Language interpreter used during this encounter. #[id number] Alamin  Orders Placed This Encounter  Procedures   CBC   Comprehensive metabolic panel   Lipid panel   H. pylori breath test     Requested Prescriptions   Signed Prescriptions Disp Refills   albuterol (VENTOLIN HFA) 108 (90 Base) MCG/ACT inhaler 18 g 3    Sig: Inhale 2 puffs into the lungs every 6 (six) hours as needed for wheezing or shortness of breath.   amLODipine (NORVASC) 10 MG tablet 90 tablet 1    Sig: Take 1 tablet (10 mg total) by mouth daily. To lower blood pressure   nystatin (MYCOSTATIN/NYSTOP) powder 15 g 0    Sig: Apply 1 application topically  2 (two) times daily. Apply to affected area under breast   omeprazole (PRILOSEC) 20 MG capsule 30 capsule 3    Sig: Take 1 capsule (20 mg total) by mouth daily. To decrease stomach acid   valsartan-hydrochlorothiazide (DIOVAN-HCT) 320-25 MG tablet 90 tablet 1    Sig: Take 1 tablet by mouth daily.    Return for Give appt with Lurena Joiner in 1 wk for BP check. Give appt with Durene Fruits in 2  mths.  Karle Plumber, MD, FACP

## 2021-04-26 LAB — CBC
Hematocrit: 38.8 % (ref 34.0–46.6)
Hemoglobin: 12.8 g/dL (ref 11.1–15.9)
MCH: 29 pg (ref 26.6–33.0)
MCHC: 33 g/dL (ref 31.5–35.7)
MCV: 88 fL (ref 79–97)
Platelets: 385 10*3/uL (ref 150–450)
RBC: 4.41 x10E6/uL (ref 3.77–5.28)
RDW: 12.4 % (ref 11.7–15.4)
WBC: 5.7 10*3/uL (ref 3.4–10.8)

## 2021-04-26 LAB — LIPID PANEL
Chol/HDL Ratio: 5.6 ratio — ABNORMAL HIGH (ref 0.0–4.4)
Cholesterol, Total: 211 mg/dL — ABNORMAL HIGH (ref 100–199)
HDL: 38 mg/dL — ABNORMAL LOW (ref >39)
LDL Chol Calc (NIH): 151 mg/dL — ABNORMAL HIGH (ref 0–99)
Triglycerides: 122 mg/dL (ref 0–149)
VLDL Cholesterol Cal: 22 mg/dL (ref 5–40)

## 2021-04-26 LAB — COMPREHENSIVE METABOLIC PANEL
ALT: 26 IU/L (ref 0–32)
AST: 22 IU/L (ref 0–40)
Albumin/Globulin Ratio: 1.5 (ref 1.2–2.2)
Albumin: 4.7 g/dL (ref 3.8–4.9)
Alkaline Phosphatase: 94 IU/L (ref 44–121)
BUN/Creatinine Ratio: 22 (ref 9–23)
BUN: 19 mg/dL (ref 6–24)
Bilirubin Total: 0.6 mg/dL (ref 0.0–1.2)
CO2: 26 mmol/L (ref 20–29)
Calcium: 9.9 mg/dL (ref 8.7–10.2)
Chloride: 101 mmol/L (ref 96–106)
Creatinine, Ser: 0.87 mg/dL (ref 0.57–1.00)
Globulin, Total: 3.1 g/dL (ref 1.5–4.5)
Glucose: 100 mg/dL — ABNORMAL HIGH (ref 65–99)
Potassium: 3.9 mmol/L (ref 3.5–5.2)
Sodium: 141 mmol/L (ref 134–144)
Total Protein: 7.8 g/dL (ref 6.0–8.5)
eGFR: 79 mL/min/{1.73_m2} (ref >59)

## 2021-04-26 LAB — H. PYLORI BREATH TEST: H pylori Breath Test: NEGATIVE

## 2021-04-27 ENCOUNTER — Telehealth: Payer: Self-pay | Admitting: Internal Medicine

## 2021-04-27 MED ORDER — ATORVASTATIN CALCIUM 10 MG PO TABS: 10.0000 mg | ORAL_TABLET | Freq: Every day | ORAL | 2 refills | Status: DC

## 2021-05-09 ENCOUNTER — Ambulatory Visit: Payer: Medicaid Other | Admitting: Pharmacist

## 2021-05-12 ENCOUNTER — Ambulatory Visit (HOSPITAL_COMMUNITY)
Admission: EM | Admit: 2021-05-12 | Discharge: 2021-05-12 | Disposition: A | Discharge status: Home / Self Care | Payer: Medicaid Other | Attending: Physician Assistant | Admitting: Physician Assistant

## 2021-05-12 ENCOUNTER — Other Ambulatory Visit: Payer: Self-pay

## 2021-05-12 ENCOUNTER — Encounter (HOSPITAL_COMMUNITY): Payer: Self-pay

## 2021-05-12 ENCOUNTER — Encounter (HOSPITAL_COMMUNITY): Payer: Self-pay | Admitting: Emergency Medicine

## 2021-05-12 ENCOUNTER — Emergency Department (HOSPITAL_COMMUNITY)
Admission: EM | Admit: 2021-05-12 | Discharge: 2021-05-12 | Disposition: A | Discharge status: Home / Self Care | Payer: Medicaid Other | Attending: Emergency Medicine | Admitting: Emergency Medicine

## 2021-05-12 ENCOUNTER — Emergency Department (HOSPITAL_COMMUNITY): Payer: Medicaid Other

## 2021-05-12 DIAGNOSIS — N9489 Other specified conditions associated with female genital organs and menstrual cycle: Secondary | ICD-10-CM | POA: Insufficient documentation

## 2021-05-12 DIAGNOSIS — R209 Unspecified disturbances of skin sensation: Secondary | ICD-10-CM | POA: Insufficient documentation

## 2021-05-12 DIAGNOSIS — I10 Essential (primary) hypertension: Secondary | ICD-10-CM | POA: Diagnosis not present

## 2021-05-12 DIAGNOSIS — H53483 Generalized contraction of visual field, bilateral: Secondary | ICD-10-CM | POA: Diagnosis not present

## 2021-05-12 DIAGNOSIS — J3489 Other specified disorders of nose and nasal sinuses: Secondary | ICD-10-CM | POA: Diagnosis not present

## 2021-05-12 DIAGNOSIS — R519 Headache, unspecified: Secondary | ICD-10-CM

## 2021-05-12 DIAGNOSIS — I161 Hypertensive emergency: Secondary | ICD-10-CM | POA: Diagnosis not present

## 2021-05-12 DIAGNOSIS — R42 Dizziness and giddiness: Secondary | ICD-10-CM | POA: Diagnosis not present

## 2021-05-12 DIAGNOSIS — Z79899 Other long term (current) drug therapy: Secondary | ICD-10-CM | POA: Diagnosis not present

## 2021-05-12 DIAGNOSIS — Z7951 Long term (current) use of inhaled steroids: Secondary | ICD-10-CM | POA: Diagnosis not present

## 2021-05-12 DIAGNOSIS — J45909 Unspecified asthma, uncomplicated: Secondary | ICD-10-CM | POA: Insufficient documentation

## 2021-05-12 DIAGNOSIS — M79602 Pain in left arm: Secondary | ICD-10-CM | POA: Diagnosis not present

## 2021-05-12 DIAGNOSIS — Z20822 Contact with and (suspected) exposure to covid-19: Secondary | ICD-10-CM | POA: Diagnosis not present

## 2021-05-12 LAB — URINALYSIS, ROUTINE W REFLEX MICROSCOPIC
Bilirubin Urine: NEGATIVE
Glucose, UA: NEGATIVE mg/dL
Ketones, ur: NEGATIVE mg/dL
Nitrite: NEGATIVE
Protein, ur: NEGATIVE mg/dL
Specific Gravity, Urine: <1.005 — ABNORMAL LOW (ref 1.005–1.030)
pH: 6 (ref 5.0–8.0)

## 2021-05-12 LAB — DIFFERENTIAL
Abs Immature Granulocytes: 0.02 10*3/uL (ref 0.00–0.07)
Basophils Absolute: 0 10*3/uL (ref 0.0–0.1)
Basophils Relative: 0 %
Eosinophils Absolute: 0.1 10*3/uL (ref 0.0–0.5)
Eosinophils Relative: 1 %
Immature Granulocytes: 0 %
Lymphocytes Relative: 34 %
Lymphs Abs: 1.9 10*3/uL (ref 0.7–4.0)
Monocytes Absolute: 0.4 10*3/uL (ref 0.1–1.0)
Monocytes Relative: 7 %
Neutro Abs: 3.1 10*3/uL (ref 1.7–7.7)
Neutrophils Relative %: 58 %

## 2021-05-12 LAB — COMPREHENSIVE METABOLIC PANEL
ALT: 25 U/L (ref 0–44)
AST: 25 U/L (ref 15–41)
Albumin: 4 g/dL (ref 3.5–5.0)
Alkaline Phosphatase: 71 U/L (ref 38–126)
Anion gap: 9 (ref 5–15)
BUN: 15 mg/dL (ref 6–20)
CO2: 27 mmol/L (ref 22–32)
Calcium: 9.4 mg/dL (ref 8.9–10.3)
Chloride: 101 mmol/L (ref 98–111)
Creatinine, Ser: 0.87 mg/dL (ref 0.44–1.00)
GFR, Estimated: >60 mL/min (ref >60)
Glucose, Bld: 126 mg/dL — ABNORMAL HIGH (ref 70–99)
Potassium: 3.4 mmol/L — ABNORMAL LOW (ref 3.5–5.1)
Sodium: 137 mmol/L (ref 135–145)
Total Bilirubin: 0.8 mg/dL (ref 0.3–1.2)
Total Protein: 7.8 g/dL (ref 6.5–8.1)

## 2021-05-12 LAB — CBC
HCT: 39.1 % (ref 36.0–46.0)
Hemoglobin: 12.9 g/dL (ref 12.0–15.0)
MCH: 29.5 pg (ref 26.0–34.0)
MCHC: 33 g/dL (ref 30.0–36.0)
MCV: 89.5 fL (ref 80.0–100.0)
Platelets: 395 10*3/uL (ref 150–400)
RBC: 4.37 MIL/uL (ref 3.87–5.11)
RDW: 12.2 % (ref 11.5–15.5)
WBC: 5.4 10*3/uL (ref 4.0–10.5)
nRBC: 0 % (ref 0.0–0.2)

## 2021-05-12 LAB — PROTIME-INR
INR: 0.9 (ref 0.8–1.2)
Prothrombin Time: 12.4 seconds (ref 11.4–15.2)

## 2021-05-12 LAB — URINALYSIS, MICROSCOPIC (REFLEX): Bacteria, UA: NONE SEEN

## 2021-05-12 LAB — APTT: aPTT: 31 seconds (ref 24–36)

## 2021-05-12 LAB — I-STAT BETA HCG BLOOD, ED (MC, WL, AP ONLY): I-stat hCG, quantitative: <5 m[IU]/mL (ref <5)

## 2021-05-12 MED ORDER — SODIUM CHLORIDE 0.9% FLUSH: 3.0000 mL | Freq: Once | INTRAVENOUS | Status: DC

## 2021-05-12 NOTE — ED Triage Notes (Signed)
Pt presents with dizziness, cold chills and stomach pain X 1 day.   Pt has not taken anything to relieve sxs.

## 2021-05-12 NOTE — Discharge Instructions (Addendum)
Go to the emergency room for further evaluation and management. 

## 2021-05-12 NOTE — Discharge Instructions (Signed)
Please make sure that you are taking your blood pressure medication as soon as you get home.  We have sent off a COVID test, you will need to follow-up with this on your MyChart account.  If you are positive for COVID you will need to isolate for 5 days  Please see your doctor for recheck in the office of your blood pressure within 1 week  Thank you for letting us take care of you today!  Please obtain all of your results from medical records or have your doctors office obtain the results - share them with your doctor - you should be seen at your doctors office in the next 2 days. Call today to arrange your follow up. Take the medications as prescribed. Please review all of the medicines and only take them if you do not have an allergy to them. Please be aware that if you are taking birth control pills, taking other prescriptions, ESPECIALLY ANTIBIOTICS may make the birth control ineffective - if this is the case, either do not engage in sexual activity or use alternative methods of birth control such as condoms until you have finished the medicine and your family doctor says it is OK to restart them. If you are on a blood thinner such as COUMADIN, be aware that any other medicine that you take may cause the coumadin to either work too much, or not enough - you should have your coumadin level rechecked in next 7 days if this is the case.  ?  It is also a possibility that you have an allergic reaction to any of the medicines that you have been prescribed - Everybody reacts differently to medications and while MOST people have no trouble with most medicines, you may have a reaction such as nausea, vomiting, rash, swelling, shortness of breath. If this is the case, please stop taking the medicine immediately and contact your physician.   If you were given a medication in the ED such as percocet, vicodin, or morphine, be aware that these medicines are sedating and may change your ability to take care of  yourself adequately for several hours after being given this medicines - you should not drive or take care of small children if you were given this medicine in the Emergency Department or if you have been prescribed these types of medicines. ?   You should return to the ER IMMEDIATELY if you develop severe or worsening symptoms.

## 2021-05-12 NOTE — ED Triage Notes (Addendum)
[employer] used for sign language interpretation.    Reports dizziness, feeling like she is in a "fog", feeling "cross-eyed", and L upper arm throbbing and numbness x 2 weeks.  No arm drift.  Hypertensive.

## 2021-05-12 NOTE — ED Provider Notes (Signed)
[facility] EMERGENCY DEPARTMENT Provider Note   CSN: [id number] Arrival date & time: 05/12/21  1753     History Chief Complaint  Patient presents with   Arm Pain   arm numbness    Caitlin [name] is a 55 y.o. female.   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  This patient states that she is being treated for hypertension and is able to tell me her medications.  She reports that she follows up with her family doctor regularly, she states that she was in her usual state of health until the last 24 hours when she started to develop a runny nose and some sneezing, today she had an episode after drinking some coffee where she felt foggy, overwhelmed, had left arm squeezing feeling, felt like there may have been some blurry vision but all of that has completely resolved.  She even at one point felt like she was having some vertigo but that is also resolved and at this time she states that she is back to her normal self.  She had no weakness in her arms or her legs, she had no numbness in the arms or the legs.  She denies to me any numbness or weakness on very clear questioning.  The patient had actually gone to the urgent care prior to coming here but because of hypertension and her symptoms she was redirected here.  She has not been around anybody who has been sick, she has never had a stroke or heart attack.  She states that she is compliant with her medications though has not had them this evening because she has been in the emergency department.  She also reports that she has a very bad diet and eats lots of fried foods which she suspects is affecting her high blood pressure.  Past Medical History:  Diagnosis Date   Asthma    Deaf    Hypertension 2011   NO (nasal obstruction)     Patient Active Problem List   Diagnosis Date Noted   Viral URI with cough  09/28/2015   Obesity (BMI 30.0-34.9) 11/20/2014   Menopausal symptoms 07/27/2014   Hair loss 05/01/2014   Onychomycosis 05/01/2014   HTN (hypertension) 03/06/2012   GERD (gastroesophageal reflux disease) 03/06/2012    Past Surgical History:  Procedure Laterality Date   CESAREAN SECTION     CHOLECYSTECTOMY     TUBAL LIGATION       OB History     Gravida  3   Para  3   Term  3   Preterm      AB      Living  3      SAB      IAB      Ectopic      Multiple      Live Births              Family History  Problem Relation Age of Onset   Heart disease Mother    Stroke Mother     Social History   Tobacco Use   Smoking status: Never   Smokeless tobacco: Never  Vaping Use   Vaping Use: Never used  Substance Use Topics   Alcohol use: Yes    Comment: occassionally    Drug use: No    Home Medications Prior to Admission medications   Medication Sig  Start Date End Date Taking? Authorizing Provider  albuterol (VENTOLIN HFA) 108 (90 Base) MCG/ACT inhaler Inhale 2 puffs into the lungs every 6 (six) hours as needed for wheezing or shortness of breath. 04/25/21   Ladell Pier, MD  amLODipine (NORVASC) 10 MG tablet Take 1 tablet (10 mg total) by mouth daily. To lower blood pressure 04/25/21   Ladell Pier, MD  atorvastatin (LIPITOR) 10 MG tablet Take 1 tablet (10 mg total) by mouth daily. 04/27/21   Ladell Pier, MD  Blood Pressure Monitor KIT Use to check blood pressure daily 10/27/19   Fulp, Cammie, MD  fluticasone (FLOVENT HFA) 110 MCG/ACT inhaler Inhale 2 puffs into the lungs 2 (two) times daily. 05/03/20   Kennith Gain, MD  loratadine (CLARITIN) 10 MG tablet Take 1 tablet (10 mg total) by mouth daily. As needed for allergy symptoms/congestion 12/19/19   Fulp, Cammie, MD  meloxicam (MOBIC) 7.5 MG tablet TAKE 1 TABLET (7.5 MG TOTAL) BY MOUTH DAILY. 09/25/20 09/25/21  Camillia Herter, NP  Misc. Devices MISC CPAP therapy on 17 cm H2O . Patient to  use X-Small size Resmed Full Face Mask AirFit F10. Diagnosis - Obstructive sleep apnea 01/25/20   Charlott Rakes, MD  nystatin (MYCOSTATIN/NYSTOP) powder Apply 1 application topically 2 (two) times daily. Apply to affected area under breast 04/25/21   Ladell Pier, MD  Olopatadine HCl 0.7 % SOLN 1 drop each eye daily as needed for itchy/watery/red eyes 05/03/20   Kennith Gain, MD  omeprazole (PRILOSEC) 20 MG capsule Take 1 capsule (20 mg total) by mouth daily. To decrease stomach acid 04/25/21   Ladell Pier, MD  traZODone (DESYREL) 50 MG tablet Take 0.5-1 tablets (25-50 mg total) by mouth at bedtime as needed for sleep. 10/27/19   Fulp, Cammie, MD  triamcinolone (NASACORT) 55 MCG/ACT AERO nasal inhaler Place 2 sprays into the nose daily. 05/03/20   Kennith Gain, MD  valsartan-hydrochlorothiazide (DIOVAN-HCT) 320-25 MG tablet Take 1 tablet by mouth daily. 04/25/21   Ladell Pier, MD  Vitamin D, Ergocalciferol, (DRISDOL) 1.25 MG (50000 UNIT) CAPS capsule Take 1 capsule (50,000 Units total) by mouth every 7 (seven) days. Patient not taking: No sig reported 10/27/19   Fulp, Cammie, MD    Allergies    Eggs or egg-derived products and Milk-related compounds  Review of Systems   Review of Systems  All other systems reviewed and are negative.  Physical Exam Updated Vital Signs BP (!) 197/87   Pulse 69   Temp 98.7 F (37.1 C) (Oral)   Resp 16   LMP 05/08/2016   SpO2 98%   Physical Exam Vitals and nursing note reviewed.  Constitutional:      General: She is not in acute distress.    Appearance: She is well-developed.  HENT:     Head: Normocephalic and atraumatic.     Mouth/Throat:     Pharynx: No oropharyngeal exudate.  Eyes:     General: No scleral icterus.       Right eye: No discharge.        Left eye: No discharge.     Conjunctiva/sclera: Conjunctivae normal.     Pupils: Pupils are equal, round, and reactive to light.  Neck:     Thyroid: No  thyromegaly.     Vascular: No JVD.  Cardiovascular:     Rate and Rhythm: Normal rate and regular rhythm.     Heart sounds: Normal heart sounds. No murmur heard.  No friction rub. No gallop.  Pulmonary:     Effort: Pulmonary effort is normal. No respiratory distress.     Breath sounds: Normal breath sounds. No wheezing or rales.  Abdominal:     General: Bowel sounds are normal. There is no distension.     Palpations: Abdomen is soft. There is no mass.     Tenderness: There is no abdominal tenderness.  Musculoskeletal:        General: No tenderness. Normal range of motion.     Cervical back: Normal range of motion and neck supple.     Right lower leg: No edema.     Left lower leg: No edema.  Lymphadenopathy:     Cervical: No cervical adenopathy.  Skin:    General: Skin is warm and dry.     Findings: No erythema or rash.  Neurological:     Mental Status: She is alert.     Coordination: Coordination normal.     Comments: Speech is clear, cranial nerves III through XII are intact, memory is intact, strength is normal in all 4 extremities including grips, sensation is intact to light touch and pinprick in all 4 extremities. Coordination as tested by finger-nose-finger is normal, no limb ataxia. Normal gait, normal reflexes at the patellar tendons bilaterally  Psychiatric:        Behavior: Behavior normal.    ED Results / Procedures / Treatments   Labs (all labs ordered are listed, but only abnormal results are displayed) Labs Reviewed  COMPREHENSIVE METABOLIC PANEL - Abnormal; Notable for the following components:      Result Value   Potassium 3.4 (*)    Glucose, Bld 126 (*)    All other components within normal limits  URINALYSIS, ROUTINE W REFLEX MICROSCOPIC - Abnormal; Notable for the following components:   Color, Urine STRAW (*)    Specific Gravity, Urine <1.005 (*)    Hgb urine dipstick TRACE (*)    Leukocytes,Ua SMALL (*)    All other components within normal limits   SARS CORONAVIRUS 2 (TAT 6-24 HRS)  PROTIME-INR  APTT  CBC  DIFFERENTIAL  URINALYSIS, MICROSCOPIC (REFLEX)  I-STAT BETA HCG BLOOD, ED ([facility], [facility], [facility] ONLY)    EKG EKG Interpretation  Date/Time:  Sunday May 12 2021 18:22:06 EDT Ventricular Rate:  65 PR Interval:  170 QRS Duration: 98 QT Interval:  438 QTC Calculation: 455 R Axis:   52 Text Interpretation: Normal sinus rhythm Normal ECG Confirmed by Noemi Chapel [phone]) on 05/12/2021 7:20:23 PM  Radiology CT HEAD WO CONTRAST  Result Date: 05/12/2021 CLINICAL DATA:  Dizziness. EXAM: CT HEAD WITHOUT CONTRAST TECHNIQUE: Contiguous axial images were obtained from the base of the skull through the vertex without intravenous contrast. COMPARISON:  Jan 10, 2016. FINDINGS: Brain: No evidence of acute infarction, hemorrhage, hydrocephalus, extra-axial collection or mass lesion/mass effect. Vascular: No hyperdense vessel or unexpected calcification. Skull: Normal. Negative for fracture or focal lesion. Sinuses/Orbits: No acute finding. Other: None. IMPRESSION: No acute intracranial abnormality seen. Electronically Signed   By: Marijo Conception M.D.   On: 05/12/2021 19:11    Procedures Procedures   Medications Ordered in ED Medications  sodium chloride flush (NS) 0.9 % injection 3 mL (3 mLs Intravenous Not Given 05/12/21 1924)    ED Course  I have reviewed the triage vital signs and the nursing notes.  Pertinent labs & imaging results that were available during my care of the patient were reviewed by me and considered in my  medical decision making (see chart for details).    MDM Rules/Calculators/A&P                           Thankfully the patient CT scan is negative, her symptoms seem to be more global than they were focal and thus I think that stroke is a less likely answer.  Her blood pressure is elevated, she will need to take her blood pressure medications as when she gets home.  I will test her for COVID and get a urine sample,  the patient is agreeable to the plan and I anticipate discharge  Thankfully the patient's testing is rather unremarkable, her blood pressure has been elevated currently 197/87, her labs, CT scan and urinalysis are reassuring, the patient is agreeable to return should symptoms worsen, she has no focal acute findings and is stable for discharge at this time  Final Clinical Impression(s) / ED Diagnoses Final diagnoses:  Pain of left upper extremity  Rhinorrhea  Primary hypertension    Rx / DC Orders ED Discharge Orders     None        Noemi Chapel, MD 05/12/21 2108

## 2021-05-12 NOTE — ED Provider Notes (Signed)
[facility]    CSN: [id number] Arrival date & time: 05/12/21  1608      History   Chief Complaint Chief Complaint  Patient presents with   Nausea   Headache    HPI Caitlin [name] is a 55 y.o. female.   Patient presents today with a 1 day history of fatigue/malaise.  She is deaf and so ASL video interpreter was utilized during visit.  She reports not feeling herself and feeling as though her body is numb/heavy.  She reports dizziness, left arm pain/numbness, headache, nausea, tunnel vision.  She denies any chest pain, shortness of breath, syncope.  She does have a history of hypertension that is poorly controlled despite 2 antihypertensive medications.  Reports taking medication as prescribed without missing doses or noted side effects.  She does have an appointment coming up with her primary care provider to address recently high blood pressure this is not scheduled for several weeks. She does drink caffeine on a regular basis but denies any sudden increase in caffeine consumption.  She denies any decongestant use or sodium intake.   Past Medical History:  Diagnosis Date   Asthma    Deaf    Hypertension 2011   NO (nasal obstruction)     Patient Active Problem List   Diagnosis Date Noted   Viral URI with cough 09/28/2015   Obesity (BMI 30.0-34.9) 11/20/2014   Menopausal symptoms 07/27/2014   Hair loss 05/01/2014   Onychomycosis 05/01/2014   HTN (hypertension) 03/06/2012   GERD (gastroesophageal reflux disease) 03/06/2012    Past Surgical History:  Procedure Laterality Date   CESAREAN SECTION     CHOLECYSTECTOMY     TUBAL LIGATION      OB History     Gravida  3   Para  3   Term  3   Preterm      AB      Living  3      SAB      IAB      Ectopic      Multiple      Live Births               Home Medications    Prior to Admission medications   Medication Sig Start Date End Date Taking? Authorizing Provider  albuterol  (VENTOLIN HFA) 108 (90 Base) MCG/ACT inhaler Inhale 2 puffs into the lungs every 6 (six) hours as needed for wheezing or shortness of breath. 04/25/21   Ladell Pier, MD  amLODipine (NORVASC) 10 MG tablet Take 1 tablet (10 mg total) by mouth daily. To lower blood pressure 04/25/21   Ladell Pier, MD  atorvastatin (LIPITOR) 10 MG tablet Take 1 tablet (10 mg total) by mouth daily. 04/27/21   Ladell Pier, MD  Blood Pressure Monitor KIT Use to check blood pressure daily 10/27/19   Fulp, Cammie, MD  fluticasone (FLOVENT HFA) 110 MCG/ACT inhaler Inhale 2 puffs into the lungs 2 (two) times daily. 05/03/20   Kennith Gain, MD  loratadine (CLARITIN) 10 MG tablet Take 1 tablet (10 mg total) by mouth daily. As needed for allergy symptoms/congestion 12/19/19   Fulp, Cammie, MD  meloxicam (MOBIC) 7.5 MG tablet TAKE 1 TABLET (7.5 MG TOTAL) BY MOUTH DAILY. 09/25/20 09/25/21  Camillia Herter, NP  Misc. Devices MISC CPAP therapy on 17 cm H2O . Patient to use X-Small size Resmed Full Face Mask AirFit F10. Diagnosis - Obstructive sleep apnea 01/25/20   Newlin,  Enobong, MD  nystatin (MYCOSTATIN/NYSTOP) powder Apply 1 application topically 2 (two) times daily. Apply to affected area under breast 04/25/21   Ladell Pier, MD  Olopatadine HCl 0.7 % SOLN 1 drop each eye daily as needed for itchy/watery/red eyes 05/03/20   Kennith Gain, MD  omeprazole (PRILOSEC) 20 MG capsule Take 1 capsule (20 mg total) by mouth daily. To decrease stomach acid 04/25/21   Ladell Pier, MD  traZODone (DESYREL) 50 MG tablet Take 0.5-1 tablets (25-50 mg total) by mouth at bedtime as needed for sleep. 10/27/19   Fulp, Cammie, MD  triamcinolone (NASACORT) 55 MCG/ACT AERO nasal inhaler Place 2 sprays into the nose daily. 05/03/20   Kennith Gain, MD  valsartan-hydrochlorothiazide (DIOVAN-HCT) 320-25 MG tablet Take 1 tablet by mouth daily. 04/25/21   Ladell Pier, MD  Vitamin D, Ergocalciferol,  (DRISDOL) 1.25 MG (50000 UNIT) CAPS capsule Take 1 capsule (50,000 Units total) by mouth every 7 (seven) days. Patient not taking: No sig reported 10/27/19   Antony Blackbird, MD    Family History Family History  Problem Relation Age of Onset   Heart disease Mother    Stroke Mother     Social History Social History   Tobacco Use   Smoking status: Never   Smokeless tobacco: Never  Vaping Use   Vaping Use: Never used  Substance Use Topics   Alcohol use: Yes    Comment: occassionally    Drug use: No     Allergies   Eggs or egg-derived products and Milk-related compounds   Review of Systems Review of Systems  Constitutional:  Positive for fatigue. Negative for activity change, appetite change and fever.  Eyes:  Positive for visual disturbance. Negative for photophobia.  Respiratory:  Negative for cough and shortness of breath.   Cardiovascular:  Negative for chest pain.  Gastrointestinal:  Positive for nausea. Negative for abdominal pain, diarrhea and vomiting.  Musculoskeletal:  Negative for arthralgias and myalgias.  Neurological:  Positive for dizziness, light-headedness and numbness. Negative for speech difficulty, weakness and headaches.    Physical Exam Triage Vital Signs ED Triage Vitals  Enc Vitals Group     BP 05/12/21 1716 (!) 227/92     Pulse Rate 05/12/21 1713 70     Resp 05/12/21 1713 17     Temp 05/12/21 1713 98.9 F (37.2 C)     Temp Source 05/12/21 1713 Oral     SpO2 05/12/21 1713 100 %     Weight --      Height --      Head Circumference --      Peak Flow --      Pain Score 05/12/21 1711 8     Pain Loc --      Pain Edu? --      Excl. in [place]? --    No data found.  Updated Vital Signs BP (!) 197/109 (BP Location: Right Arm)   Pulse 70   Temp 98.9 F (37.2 C) (Oral)   Resp 17   LMP 05/08/2016   SpO2 100%   Visual Acuity Right Eye Distance:   Left Eye Distance:   Bilateral Distance:    Right Eye Near:   Left Eye Near:    Bilateral  Near:     Physical Exam Vitals reviewed.  Constitutional:      General: She is awake. She is not in acute distress.    Appearance: Normal appearance. She is well-developed. She is not ill-appearing.  Comments: Very pleasant female appears stated age in no acute distress sitting comfortably in exam room  HENT:     Head: Normocephalic and atraumatic.  Eyes:     Extraocular Movements: Extraocular movements intact.  Cardiovascular:     Rate and Rhythm: Normal rate and regular rhythm.     Pulses:          Posterior tibial pulses are 2+ on the right side and 2+ on the left side.     Heart sounds: Normal heart sounds, S1 normal and S2 normal. No murmur heard. Pulmonary:     Effort: Pulmonary effort is normal.     Breath sounds: Normal breath sounds. No wheezing, rhonchi or rales.     Comments: Clear to auscultation bilaterally Abdominal:     Palpations: Abdomen is soft.     Tenderness: There is no abdominal tenderness.  Musculoskeletal:     Right lower leg: No edema.     Left lower leg: No edema.     Comments: Strength 5/5 bilateral upper and lower extremities  Neurological:     Mental Status: She is alert.  Psychiatric:        Behavior: Behavior is cooperative.     UC Treatments / Results  Labs (all labs ordered are listed, but only abnormal results are displayed) Labs Reviewed - No data to display  EKG   Radiology No results found.  Procedures Procedures (including critical care time)  Medications Ordered in UC Medications - No data to display  Initial Impression / Assessment and Plan / UC Course  I have reviewed the triage vital signs and the nursing notes.  Pertinent labs & imaging results that were available during my care of the patient were reviewed by me and considered in my medical decision making (see chart for details).      EKG obtained showed no ischemic changes no significant change from 12/27/2015 tracing.  Patient's blood pressure was initially  227/92 repeat was 197/109.  Patient reports several concerning symptoms including left arm numbness, dizziness, vision changes, headache in the setting of high blood pressure.  Discussed the need for more intensive work-up including imaging/stat labs which not available in urgent care.  She is agreeable to go to the emergency room will go directly to the emergency room following visit today.  She was brought here today by a friend who will drive her to the ER after visit.  Vital signs are stable at the time of discharge.  Final Clinical Impressions(s) / UC Diagnoses   Final diagnoses:  Hypertensive emergency  Nonintractable headache, unspecified chronicity pattern, unspecified headache type  Tunnel visual field constriction of both eyes  Dizziness  Left arm pain     Discharge Instructions      Go to the emergency room for further evaluation and management     ED Prescriptions   None    PDMP not reviewed this encounter.   Terrilee Croak, PA-C 05/12/21 1748

## 2021-05-13 LAB — SARS CORONAVIRUS 2 (TAT 6-24 HRS): SARS Coronavirus 2: NEGATIVE

## 2021-05-20 ENCOUNTER — Ambulatory Visit: Payer: Self-pay

## 2021-05-20 NOTE — Telephone Encounter (Signed)
Reason for Disposition  [1] Systolic BP  >= 160 OR Diastolic >= 100 AND [2] cardiac or neurologic symptoms (e.g., chest pain, difficulty breathing, unsteady gait, blurred vision)  Answer Assessment - Initial Assessment Questions 1. BLOOD PRESSURE: "What is the blood pressure?" "Did you take at least two measurements 5 minutes apart?"     Not sure 2. ONSET: "When did you take your blood pressure?"     today 3. HOW: "How did you obtain the blood pressure?" (e.g., visiting nurse, automatic home BP monitor)     Neighbor 4. HISTORY: "Do you have a history of high blood pressure?"     yes 5. MEDICATIONS: "Are you taking any medications for blood pressure?" "Have you missed any doses recently?"     Yes.  6. OTHER SYMPTOMS: "Do you have any symptoms?" (e.g., headache, chest pain, blurred vision, difficulty breathing, weakness)     Dizzy when takes medicine for BP. Has not taken in 3-4 days "It makes me dizzy and vision blurry." Onset Sunday.  Protocols used: Blood Pressure - High-A-AH

## 2021-05-20 NOTE — Telephone Encounter (Signed)
Pt evasive historian.  Assisted by Sign Language Interpreter [phone]. Pt reports neighbor checked her BP today and it was high. Cannot recall value. States she stopped taking her BP meds 3-4 days ago "Because they made me dizzy and the old lady told me not to take them." States referring to neighbor. Reports dizziness "Spinning" CP and tunnel vision  "Vision not in focus and things are closer." Also reports "Having to take deep breaths." Advised ED, pt states will follow disposition, has driver.

## 2021-05-20 NOTE — Telephone Encounter (Signed)
Yes

## 2021-05-28 NOTE — Progress Notes (Signed)
   S:    PCP: Ricky Stabs, NP PMH: HTN, GERD, obesity  Patient presents to the clinic for hypertension evaluation, counseling, and management. Patient was referred on 04/25/21 by Dr. Laural Benes. Patient was last seen by Primary Care Provider on 09/25/20. At visit with Dr. Laural Benes, BP was 171/97, patient reported compliance with HTN medications but based on fill dates should have been run out a while ago. Referred to CPP for BP check but patient no showed. Since then, she has been to urgent care where she was referred to the ED for hypertensive emergency with BP 227/92 and symptoms including left arm numbness, dizziness, vision changes, and headache. CT scan negative for stroke. Reported medication compliance at that time.   Today, patient arrives accompanied by an ASL interpreter. Reports that she stopped taking her medications on 9/29 after her ED visit because they made her feel funny, sick, shaky. She does not have a cuff at home and does not check. She is experiencing blurry vision, left arm feels funny, had a headache yesterday but no headache or chest pain today. No weakness of left side. We called her [facility] pharmacy that her medications have been sent to and she has not picked up her antihypertensives since July 2021. She says she last picked them up 1-2 months ago.   Medication adherence denied.  Current BP Medications include:  amlodipine 10 mg daily, valsartan-HCTZ 320-25 mg daily (not taking)  O:  Home BP readings: none  Last 3 Office BP readings: BP Readings from Last 3 Encounters:  05/29/21 (!) 225/119  05/12/21 (!) 170/95  05/12/21 (!) 197/109    BMET    Component Value Date/Time   NA 137 05/12/2021 1849   NA 141 04/25/2021 1128   NA 140 05/28/2012 1937   K 3.4 (L) 05/12/2021 1849   K 3.1 (L) 05/28/2012 1937   CL 101 05/12/2021 1849   CL 103 05/28/2012 1937   CO2 27 05/12/2021 1849   CO2 26 05/28/2012 1937   GLUCOSE 126 (H) 05/12/2021 1849   GLUCOSE 98 05/28/2012 1937    BUN 15 05/12/2021 1849   BUN 19 04/25/2021 1128   BUN 15 05/28/2012 1937   CREATININE 0.87 05/12/2021 1849   CREATININE 0.81 09/28/2015 1200   CALCIUM 9.4 05/12/2021 1849   CALCIUM 8.7 05/28/2012 1937   GFRNONAA >60 05/12/2021 1849   GFRNONAA 86 09/28/2015 1200   GFRAA 82 01/05/2020 0924   GFRAA >89 09/28/2015 1200    Renal function: CrCl cannot be calculated (Unknown ideal weight.).  Clinical ASCVD: No  The ASCVD Risk score (Arnett DK, et al., 2019) failed to calculate for the following reasons:   The valid systolic blood pressure range is 90 to 200 mmHg   A/P: Hypertension currently uncontrolled due to medication noncompliance. BP in clinic 225/119 and symptomatic.  -Given significantly elevated BP and symptomatic (blurred vision, left arm "feels funny"), we have discussed with Dr. Alvis Lemmings who will see the patient today.   Total time in face-to-face counseling 25 minutes.   Pervis Hocking, PharmD PGY2 Ambulatory Care Pharmacy Resident 05/29/2021 10:01 AM

## 2021-05-29 ENCOUNTER — Other Ambulatory Visit: Payer: Self-pay

## 2021-05-29 ENCOUNTER — Encounter: Payer: Self-pay | Admitting: Family Medicine

## 2021-05-29 ENCOUNTER — Ambulatory Visit: Payer: Medicaid Other | Attending: Family Medicine | Admitting: Family Medicine

## 2021-05-29 VITALS — BP 178/81 | HR 71

## 2021-05-29 DIAGNOSIS — R0789 Other chest pain: Secondary | ICD-10-CM

## 2021-05-29 DIAGNOSIS — Z7951 Long term (current) use of inhaled steroids: Secondary | ICD-10-CM | POA: Insufficient documentation

## 2021-05-29 DIAGNOSIS — Z8249 Family history of ischemic heart disease and other diseases of the circulatory system: Secondary | ICD-10-CM | POA: Insufficient documentation

## 2021-05-29 DIAGNOSIS — H9190 Unspecified hearing loss, unspecified ear: Secondary | ICD-10-CM | POA: Insufficient documentation

## 2021-05-29 DIAGNOSIS — Z79899 Other long term (current) drug therapy: Secondary | ICD-10-CM | POA: Insufficient documentation

## 2021-05-29 DIAGNOSIS — E669 Obesity, unspecified: Secondary | ICD-10-CM | POA: Insufficient documentation

## 2021-05-29 DIAGNOSIS — I10 Essential (primary) hypertension: Secondary | ICD-10-CM

## 2021-05-29 DIAGNOSIS — Z713 Dietary counseling and surveillance: Secondary | ICD-10-CM | POA: Insufficient documentation

## 2021-05-29 DIAGNOSIS — K219 Gastro-esophageal reflux disease without esophagitis: Secondary | ICD-10-CM | POA: Diagnosis not present

## 2021-05-29 DIAGNOSIS — Z7182 Exercise counseling: Secondary | ICD-10-CM | POA: Insufficient documentation

## 2021-05-29 DIAGNOSIS — R531 Weakness: Secondary | ICD-10-CM | POA: Insufficient documentation

## 2021-05-29 DIAGNOSIS — M79602 Pain in left arm: Secondary | ICD-10-CM

## 2021-05-29 DIAGNOSIS — H538 Other visual disturbances: Secondary | ICD-10-CM | POA: Insufficient documentation

## 2021-05-29 DIAGNOSIS — Z91148 Patient's other noncompliance with medication regimen for other reason: Secondary | ICD-10-CM

## 2021-05-29 DIAGNOSIS — Z791 Long term (current) use of non-steroidal anti-inflammatories (NSAID): Secondary | ICD-10-CM | POA: Diagnosis not present

## 2021-05-29 DIAGNOSIS — Z9114 Patient's other noncompliance with medication regimen: Secondary | ICD-10-CM | POA: Diagnosis not present

## 2021-05-29 MED ORDER — CLONIDINE HCL 0.2 MG PO TABS
0.2000 mg | ORAL_TABLET | Freq: Once | ORAL | Status: AC
  Administered 2021-05-29: 0.2 mg via ORAL

## 2021-05-29 NOTE — Progress Notes (Signed)
Subjective:  Patient ID: Caitlin [name], female    DOB: 03-May-1966  Age: 55 y.o. MRN: [id number]  CC: Hypertension   HPI Caitlin [name] is a 55 y.o. year old female with a history of hypertension, hearing loss who presents today for clinical pharmacy visit for BP evaluation but was found to have accelerated hypertension with a blood pressure of 225/119 and subsequently transferred to my schedule. Accompanied by an interpreter.  Interval History: She endorses not taking her medication "as it made her feel weird". States the medications seemed okay until 05/16/21 when she stopped taking them as she does not know if "her body did not agree with them". She described sensation of feeling weak and her balance was off, she had blurry vision. She is unable to tell me clearly if symptoms resolved after she discontinued her medications. Now her L arm feels funny like a throbbing feeling, and yesterday she had chest pain but today she states she has very slight funny feeling in her chest.  On 05/12/2024 she was seen at the ED for left upper extremity pain and numbness, BP was 197/87, EKG revealed normal sinus rhythm. Past Medical History:  Diagnosis Date   Asthma    Deaf    Hypertension 2011   NO (nasal obstruction)     Past Surgical History:  Procedure Laterality Date   CESAREAN SECTION     CHOLECYSTECTOMY     TUBAL LIGATION      Family History  Problem Relation Age of Onset   Heart disease Mother    Stroke Mother     Allergies  Allergen Reactions   Eggs Or Egg-Derived Products Nausea And Vomiting   Milk-Related Compounds     Outpatient Medications Prior to Visit  Medication Sig Dispense Refill   albuterol (VENTOLIN HFA) 108 (90 Base) MCG/ACT inhaler Inhale 2 puffs into the lungs every 6 (six) hours as needed for wheezing or shortness of breath. 18 g 3   amLODipine (NORVASC) 10 MG tablet Take 1 tablet (10 mg total) by mouth daily. To lower blood pressure 90 tablet 1    atorvastatin (LIPITOR) 10 MG tablet Take 1 tablet (10 mg total) by mouth daily. 30 tablet 2   Blood Pressure Monitor KIT Use to check blood pressure daily 1 kit 0   fluticasone (FLOVENT HFA) 110 MCG/ACT inhaler Inhale 2 puffs into the lungs 2 (two) times daily. 12 g 5   loratadine (CLARITIN) 10 MG tablet Take 1 tablet (10 mg total) by mouth daily. As needed for allergy symptoms/congestion 90 tablet 3   meloxicam (MOBIC) 7.5 MG tablet TAKE 1 TABLET (7.5 MG TOTAL) BY MOUTH DAILY. 30 tablet 0   Misc. Devices MISC CPAP therapy on 17 cm H2O . Patient to use X-Small size Resmed Full Face Mask AirFit F10. Diagnosis - Obstructive sleep apnea 1 each 0   nystatin (MYCOSTATIN/NYSTOP) powder Apply 1 application topically 2 (two) times daily. Apply to affected area under breast 15 g 0   Olopatadine HCl 0.7 % SOLN 1 drop each eye daily as needed for itchy/watery/red eyes 2.5 mL 5   omeprazole (PRILOSEC) 20 MG capsule Take 1 capsule (20 mg total) by mouth daily. To decrease stomach acid 30 capsule 3   traZODone (DESYREL) 50 MG tablet Take 0.5-1 tablets (25-50 mg total) by mouth at bedtime as needed for sleep. 30 tablet 3   triamcinolone (NASACORT) 55 MCG/ACT AERO nasal inhaler Place 2 sprays into the nose daily. 16.5 g 5  valsartan-hydrochlorothiazide (DIOVAN-HCT) 320-25 MG tablet Take 1 tablet by mouth daily. 90 tablet 1   Vitamin D, Ergocalciferol, (DRISDOL) 1.25 MG (50000 UNIT) CAPS capsule Take 1 capsule (50,000 Units total) by mouth every 7 (seven) days. (Patient not taking: No sig reported) 16 capsule 0   No facility-administered medications prior to visit.     ROS Review of Systems  Constitutional:  Negative for activity change, appetite change and fatigue.  HENT:  Positive for hearing loss. Negative for congestion, sinus pressure and sore throat.   Eyes:  Negative for visual disturbance.  Respiratory:  Negative for cough, chest tightness, shortness of breath and wheezing.   Cardiovascular:   Negative for chest pain and palpitations.  Gastrointestinal:  Negative for abdominal distention, abdominal pain and constipation.  Endocrine: Negative for polydipsia.  Genitourinary:  Negative for dysuria and frequency.  Musculoskeletal:        See HPI  Skin:  Negative for rash.  Neurological:  Negative for tremors, light-headedness and numbness.  Hematological:  Does not bruise/bleed easily.  Psychiatric/Behavioral:  Negative for agitation and behavioral problems.    Objective:  BP (!) 225/119 (BP Location: Left Arm, Patient Position: Sitting, Cuff Size: Normal)   Pulse 71   LMP 05/08/2016   BP/Weight 05/29/2021 05/12/2021 12/09/9530  Systolic BP 023 343 568  Diastolic BP 616 95 837  Wt. (Lbs) - - -  BMI - - -      Physical Exam Constitutional:      Appearance: She is well-developed.  Cardiovascular:     Rate and Rhythm: Normal rate.     Heart sounds: Normal heart sounds. No murmur heard. Pulmonary:     Effort: Pulmonary effort is normal.     Breath sounds: Normal breath sounds. No wheezing or rales.  Chest:     Chest wall: No tenderness.  Abdominal:     General: Bowel sounds are normal. There is no distension.     Palpations: Abdomen is soft. There is no mass.     Tenderness: There is no abdominal tenderness.  Musculoskeletal:        General: Normal range of motion.     Right lower leg: No edema.     Left lower leg: No edema.     Comments: No tenderness on palpation of bilateral upper extremities or range of motion.  Neurological:     Mental Status: She is alert and oriented to person, place, and time.  Psychiatric:        Mood and Affect: Mood normal.    CMP Latest Ref Rng & Units 05/12/2021 04/25/2021 01/05/2020  Glucose 70 - 99 mg/dL 126(H) 100(H) 100(H)  BUN 6 - 20 mg/dL '15 19 21  ' Creatinine 0.44 - 1.00 mg/dL 0.87 0.87 0.92  Sodium 135 - 145 mmol/L 137 141 141  Potassium 3.5 - 5.1 mmol/L 3.4(L) 3.9 4.5  Chloride 98 - 111 mmol/L 101 101 98  CO2 22 - 32 mmol/L  '27 26 25  ' Calcium 8.9 - 10.3 mg/dL 9.4 9.9 9.9  Total Protein 6.5 - 8.1 g/dL 7.8 7.8 -  Total Bilirubin 0.3 - 1.2 mg/dL 0.8 0.6 -  Alkaline Phos 38 - 126 U/L 71 94 -  AST 15 - 41 U/L 25 22 -  ALT 0 - 44 U/L 25 26 -    Lipid Panel     Component Value Date/Time   CHOL 211 (H) 04/25/2021 1128   TRIG 122 04/25/2021 1128   HDL 38 (L) 04/25/2021 1128  CHOLHDL 5.6 (H) 04/25/2021 1128   CHOLHDL 4.0 03/06/2012 0542   VLDL 7 03/06/2012 0542   LDLCALC 151 (H) 04/25/2021 1128    CBC    Component Value Date/Time   WBC 5.4 05/12/2021 1849   RBC 4.37 05/12/2021 1849   HGB 12.9 05/12/2021 1849   HGB 12.8 04/25/2021 1128   HCT 39.1 05/12/2021 1849   HCT 38.8 04/25/2021 1128   PLT 395 05/12/2021 1849   PLT 385 04/25/2021 1128   MCV 89.5 05/12/2021 1849   MCV 88 04/25/2021 1128   MCV 90 05/28/2012 1937   MCH 29.5 05/12/2021 1849   MCHC 33.0 05/12/2021 1849   RDW 12.2 05/12/2021 1849   RDW 12.4 04/25/2021 1128   RDW 13.1 05/28/2012 1937   LYMPHSABS 1.9 05/12/2021 1849   LYMPHSABS 2.0 06/16/2019 1012   MONOABS 0.4 05/12/2021 1849   EOSABS 0.1 05/12/2021 1849   EOSABS 0.1 06/16/2019 1012   BASOSABS 0.0 05/12/2021 1849   BASOSABS 0.0 06/16/2019 1012    Lab Results  Component Value Date   HGBA1C 5.5 10/27/2019    Assessment & Plan:  1. Accelerated hypertension Uncontrolled due to noncompliance Advised that she can start out by taking 1 antihypertensive for 1 week and then add on the second antihypertensive the next week due to the fact that she has problems with tolerance She will follow-up with PCP to evaluate if substitution of antihypertensive is indicated Clonidine 0.2 mg administered in the clinic-side effects discussed.  Patient monitored for 30 minutes and blood pressure repeated after. Counseled on blood pressure goal of less than 130/80, low-sodium, DASH diet, medication compliance, 150 minutes of moderate intensity exercise per week. Discussed medication compliance,  adverse effects. - cloNIDine (CATAPRES) tablet 0.2 mg  2. Left arm pain She presented to the ED with similar symptoms 3 weeks ago Will perform EKG to exclude cardiac etiology  3. Atypical chest pain EKG is negative  Risk factor modification  4. Non compliance w medication regimen Counseled on the importance of compliance with medications Discussed the fact that she is at increased risk of cardiovascular events including stroke if she continues with medication nonadherence Counseled that there are various antihypertensive regimens that she could be switched from one class of medication to another if tolerance is an issue.    Meds ordered this encounter  Medications   cloNIDine (CATAPRES) tablet 0.2 mg    Follow-up: Return in about 3 weeks (around 06/19/2021) for Follow-up of hypertension with Dr. Wynetta Emery.Charlott Rakes, MD, FAAFP. [facility], [place]   05/29/2021, 10:46 AM

## 2021-06-13 ENCOUNTER — Ambulatory Visit: Payer: Medicaid Other | Admitting: Allergy

## 2021-06-21 ENCOUNTER — Ambulatory Visit: Payer: Medicaid Other | Attending: Internal Medicine | Admitting: Internal Medicine

## 2021-06-21 ENCOUNTER — Other Ambulatory Visit: Payer: Self-pay

## 2021-06-21 ENCOUNTER — Encounter: Payer: Self-pay | Admitting: Internal Medicine

## 2021-06-21 VITALS — BP 152/77 | HR 79 | Resp 16 | Wt 187.4 lb

## 2021-06-21 DIAGNOSIS — I10 Essential (primary) hypertension: Secondary | ICD-10-CM | POA: Diagnosis not present

## 2021-06-21 DIAGNOSIS — Z23 Encounter for immunization: Secondary | ICD-10-CM | POA: Diagnosis not present

## 2021-06-21 MED ORDER — CARVEDILOL 3.125 MG PO TABS: 3.1250 mg | ORAL_TABLET | Freq: Two times a day (BID) | ORAL | 3 refills | Status: DC

## 2021-06-21 NOTE — Progress Notes (Signed)
Patient ID: Caitlin [name], female    DOB: November 05, 1965  MRN: [id number]  CC: Blood Pressure Check   Subjective: Caitlin [name] is a 55 y.o. female who presents for F/U htn.  ELAINE MONTGOMERY from [facility] is with her and interprets Her concerns today include:  Pt with hx of HTN, GERD, obesity, asthma  HTN: On last visit with Dr. Margarita Rana, patient found to have accelerated hypertension with blood pressure of 225/119.  She was not taking her blood pressure medicines consistently.  Reports compliance since last visit.  Takes Norvasc at nights and Valsartan/HCTZ in the mornings Took Diovan/HCTZ this a.m She limits salt intake No CP/SOB She asked about whether she can drink ETOH beverages on her medications.  Drinks occasionally a beer or a glass of wine  Patient Active Problem List   Diagnosis Date Noted   Viral URI with cough 09/28/2015   Obesity (BMI 30.0-34.9) 11/20/2014   Menopausal symptoms 07/27/2014   Hair loss 05/01/2014   Onychomycosis 05/01/2014   HTN (hypertension) 03/06/2012   GERD (gastroesophageal reflux disease) 03/06/2012     Current Outpatient Medications on File Prior to Visit  Medication Sig Dispense Refill   albuterol (VENTOLIN HFA) 108 (90 Base) MCG/ACT inhaler Inhale 2 puffs into the lungs every 6 (six) hours as needed for wheezing or shortness of breath. 18 g 3   amLODipine (NORVASC) 10 MG tablet Take 1 tablet (10 mg total) by mouth daily. To lower blood pressure 90 tablet 1   atorvastatin (LIPITOR) 10 MG tablet Take 1 tablet (10 mg total) by mouth daily. 30 tablet 2   Blood Pressure Monitor KIT Use to check blood pressure daily 1 kit 0   fluticasone (FLOVENT HFA) 110 MCG/ACT inhaler Inhale 2 puffs into the lungs 2 (two) times daily. 12 g 5   loratadine (CLARITIN) 10 MG tablet Take 1 tablet (10 mg total) by mouth daily. As needed for allergy symptoms/congestion 90 tablet 3   meloxicam (MOBIC) 7.5 MG tablet TAKE 1 TABLET (7.5 MG TOTAL) BY MOUTH DAILY. 30 tablet 0    Misc. Devices MISC CPAP therapy on 17 cm H2O . Patient to use X-Small size Resmed Full Face Mask AirFit F10. Diagnosis - Obstructive sleep apnea 1 each 0   nystatin (MYCOSTATIN/NYSTOP) powder Apply 1 application topically 2 (two) times daily. Apply to affected area under breast 15 g 0   Olopatadine HCl 0.7 % SOLN 1 drop each eye daily as needed for itchy/watery/red eyes 2.5 mL 5   omeprazole (PRILOSEC) 20 MG capsule Take 1 capsule (20 mg total) by mouth daily. To decrease stomach acid 30 capsule 3   traZODone (DESYREL) 50 MG tablet Take 0.5-1 tablets (25-50 mg total) by mouth at bedtime as needed for sleep. 30 tablet 3   triamcinolone (NASACORT) 55 MCG/ACT AERO nasal inhaler Place 2 sprays into the nose daily. 16.5 g 5   valsartan-hydrochlorothiazide (DIOVAN-HCT) 320-25 MG tablet Take 1 tablet by mouth daily. 90 tablet 1   Vitamin D, Ergocalciferol, (DRISDOL) 1.25 MG (50000 UNIT) CAPS capsule Take 1 capsule (50,000 Units total) by mouth every 7 (seven) days. 16 capsule 0   No current facility-administered medications on file prior to visit.    Allergies  Allergen Reactions   Eggs Or Egg-Derived Products Nausea And Vomiting   Milk-Related Compounds     Social History   Socioeconomic History   Marital status: Married    Spouse name: Not on file   Number of children: 3  Years of education: Not on file   Highest education level: Not on file  Occupational History    Employer: UNEMPLOYED  Tobacco Use   Smoking status: Never   Smokeless tobacco: Never  Vaping Use   Vaping Use: Never used  Substance and Sexual Activity   Alcohol use: Yes    Comment: occassionally    Drug use: No   Sexual activity: Yes    Birth control/protection: Surgical  Other Topics Concern   Not on file  Social History Narrative   Not on file   Social Determinants of Health   Financial Resource Strain: Not on file  Food Insecurity: Not on file  Transportation Needs: Not on file  Physical Activity:  Not on file  Stress: Not on file  Social Connections: Not on file  Intimate Partner Violence: Not on file    Family History  Problem Relation Age of Onset   Heart disease Mother    Stroke Mother     Past Surgical History:  Procedure Laterality Date   CESAREAN SECTION     CHOLECYSTECTOMY     TUBAL LIGATION      ROS: Review of Systems Negative except as stated above  PHYSICAL EXAM: BP (!) 152/77   Pulse 79   Resp 16   Wt 187 lb 6.4 oz (85 kg)   LMP 05/08/2016   SpO2 98%   BMI 34.28 kg/m   Physical Exam  General appearance - alert, well appearing, and in no distress Mental status - normal mood, behavior, speech, dress, motor activity, and thought processes Chest - clear to auscultation, no wheezes, rales or rhonchi, symmetric air entry Heart - normal rate, regular rhythm, normal S1, S2, no murmurs, rubs, clicks or gallops   CMP Latest Ref Rng & Units 05/12/2021 04/25/2021 01/05/2020  Glucose 70 - 99 mg/dL 126(H) 100(H) 100(H)  BUN 6 - 20 mg/dL '15 19 21  ' Creatinine 0.44 - 1.00 mg/dL 0.87 0.87 0.92  Sodium 135 - 145 mmol/L 137 141 141  Potassium 3.5 - 5.1 mmol/L 3.4(L) 3.9 4.5  Chloride 98 - 111 mmol/L 101 101 98  CO2 22 - 32 mmol/L '27 26 25  ' Calcium 8.9 - 10.3 mg/dL 9.4 9.9 9.9  Total Protein 6.5 - 8.1 g/dL 7.8 7.8 -  Total Bilirubin 0.3 - 1.2 mg/dL 0.8 0.6 -  Alkaline Phos 38 - 126 U/L 71 94 -  AST 15 - 41 U/L 25 22 -  ALT 0 - 44 U/L 25 26 -   Lipid Panel     Component Value Date/Time   CHOL 211 (H) 04/25/2021 1128   TRIG 122 04/25/2021 1128   HDL 38 (L) 04/25/2021 1128   CHOLHDL 5.6 (H) 04/25/2021 1128   CHOLHDL 4.0 03/06/2012 0542   VLDL 7 03/06/2012 0542   LDLCALC 151 (H) 04/25/2021 1128    CBC    Component Value Date/Time   WBC 5.4 05/12/2021 1849   RBC 4.37 05/12/2021 1849   HGB 12.9 05/12/2021 1849   HGB 12.8 04/25/2021 1128   HCT 39.1 05/12/2021 1849   HCT 38.8 04/25/2021 1128   PLT 395 05/12/2021 1849   PLT 385 04/25/2021 1128   MCV 89.5  05/12/2021 1849   MCV 88 04/25/2021 1128   MCV 90 05/28/2012 1937   MCH 29.5 05/12/2021 1849   MCHC 33.0 05/12/2021 1849   RDW 12.2 05/12/2021 1849   RDW 12.4 04/25/2021 1128   RDW 13.1 05/28/2012 1937   LYMPHSABS 1.9 05/12/2021 1849  LYMPHSABS 2.0 06/16/2019 1012   MONOABS 0.4 05/12/2021 1849   EOSABS 0.1 05/12/2021 1849   EOSABS 0.1 06/16/2019 1012   BASOSABS 0.0 05/12/2021 1849   BASOSABS 0.0 06/16/2019 1012    ASSESSMENT AND PLAN: 1. Essential hypertension Blood pressure better now that she is consistent with taking her medicines.  However blood pressure not at goal which is 130/80 or lower.  We will add a low-dose of carvedilol.  I will have her follow-up with the clinical pharmacist in 2 weeks for recheck. - carvedilol (COREG) 3.125 MG tablet; Take 1 tablet (3.125 mg total) by mouth 2 (two) times daily with a meal.  Dispense: 60 tablet; Refill: 3  2. Need for influenza vaccination Given today.  3. Need for vaccination against Streptococcus pneumoniae Given Prevnar 20    Patient was given the opportunity to ask questions.  Patient verbalized understanding of the plan and was able to repeat key elements of the plan.   Orders Placed This Encounter  Procedures   Pneumococcal conjugate vaccine 20-valent (Prevnar 20)   Flu Vaccine QUAD 41moIM (Fluarix, Fluzone & Alfiuria Quad PF)      Requested Prescriptions   Signed Prescriptions Disp Refills   carvedilol (COREG) 3.125 MG tablet 60 tablet 3    Sig: Take 1 tablet (3.125 mg total) by mouth 2 (two) times daily with a meal.    [facility]  DKarle Plumber MD, FACP

## 2021-06-25 ENCOUNTER — Ambulatory Visit: Payer: Medicaid Other | Admitting: Family

## 2021-07-09 ENCOUNTER — Ambulatory Visit: Payer: Medicaid Other | Admitting: Pharmacist

## 2021-07-09 NOTE — Progress Notes (Unsigned)
   S:    Patient arrives ***.    Presents to the clinic for hypertension evaluation, counseling, and management.  Patient was referred and last seen by Primary Care Provider on ***.  Patient was referred on ***.  Patient was last seen by Primary Care Provider on ***.   Medication adherence *** .  Current BP Medications include:  ***  Antihypertensives tried in the past include: ***  Dietary habits include: *** Exercise habits include:*** Family / Social history: ***  ASCVD risk factors include:***   O:   Home BP readings: ***  Last 3 Office BP readings: BP Readings from Last 3 Encounters:  06/21/21 (!) 152/77  05/29/21 (!) 178/81  05/12/21 (!) 170/95    BMET    Component Value Date/Time   NA 137 05/12/2021 1849   NA 141 04/25/2021 1128   NA 140 05/28/2012 1937   K 3.4 (L) 05/12/2021 1849   K 3.1 (L) 05/28/2012 1937   CL 101 05/12/2021 1849   CL 103 05/28/2012 1937   CO2 27 05/12/2021 1849   CO2 26 05/28/2012 1937   GLUCOSE 126 (H) 05/12/2021 1849   GLUCOSE 98 05/28/2012 1937   BUN 15 05/12/2021 1849   BUN 19 04/25/2021 1128   BUN 15 05/28/2012 1937   CREATININE 0.87 05/12/2021 1849   CREATININE 0.81 09/28/2015 1200   CALCIUM 9.4 05/12/2021 1849   CALCIUM 8.7 05/28/2012 1937   GFRNONAA >60 05/12/2021 1849   GFRNONAA 86 09/28/2015 1200   GFRAA 82 01/05/2020 0924   GFRAA >89 09/28/2015 1200   Renal function: CrCl cannot be calculated (Patient's most recent lab result is older than the maximum 21 days allowed.).  Clinical ASCVD: No  The 10-year ASCVD risk score (Arnett DK, et al., 2019) is: 12.8%   Values used to calculate the score:     Age: 55 years     Sex: Female     Is Non-Hispanic African American: Yes     Diabetic: No     Tobacco smoker: No     Systolic Blood Pressure: 152 mmHg     Is BP treated: Yes     HDL Cholesterol: 38 mg/dL     Total Cholesterol: 211 mg/dL  A/P: Hypertension diagnosed 02/2012 currently *** on current medications. BP  Goal = < *** mmHg. Medication adherence ***.  -{Meds adjust:18428} ***.  -F/u labs ordered - *** -Counseled on lifestyle modifications for blood pressure control including reduced dietary sodium, increased exercise, adequate sleep.  Results reviewed and written information provided.   Total time in face-to-face counseling *** minutes.   F/U Clinic Visit in ***.  Patient seen with ***

## 2021-07-25 ENCOUNTER — Ambulatory Visit: Payer: Self-pay | Admitting: *Deleted

## 2021-07-25 NOTE — Telephone Encounter (Signed)
Pt calling to report BP high, however has no values. States does not have home monitor yet, has not been checked lately elsewhere. Reports has been taking amlodipine 10mg  at HS x  2 weeks. Reports lightheadedness in mornings. States resolves after drinking water. States occurs mostly in mornings but at times after other meals, always after eating. States mild lightheadedness, no spinning. No avability at practice within protocol timeframe of 3 days. Assured pt NT would route to practice for PCPs review and final disposition. CAre advise given, advised ED/UC for worsening symptoms. Also advised to have BP checked if possible, states she will check with neighbor. Please advise: [phone]  Assisted by Interpreter # [phone], "[facility] [employer] [facility]."

## 2021-07-25 NOTE — Telephone Encounter (Signed)
Reason for Disposition  [1] MILD dizziness (e.g., walking normally) AND [2] has NOT been evaluated by physician for this  (Exception: dizziness caused by heat exposure, sudden standing, or poor fluid intake)  Answer Assessment - Initial Assessment Questions 1. BLOOD PRESSURE: "What is the blood pressure?" "Did you take at least two measurements 5 minutes apart?"     Not sure 2. ONSET: "When did you take your blood pressure?"     *No Answer* 3. HOW: "How did you obtain the blood pressure?" (e.g., visiting nurse, automatic home BP monitor)     *No Answer* 4. HISTORY: "Do you have a history of high blood pressure?"     *No Answer* 5. MEDICATIONS: "Are you taking any medications for blood pressure?" "Have you missed any doses recently?"     *No Answer* 6. OTHER SYMPTOMS: "Do you have any symptoms?" (e.g., headache, chest pain, blurred vision, difficulty breathing, weakness)     *No Answer* 7. PREGNANCY: "Is there any chance you are pregnant?" "When was your last menstrual period?"     *No Answer*  Answer Assessment - Initial Assessment Questions 1. DESCRIPTION: "Describe your dizziness."     Lightheaded after eating 2. LIGHTHEADED: "Do you feel lightheaded?" (e.g., somewhat faint, woozy, weak upon standing)     Yes, after eating only 3. VERTIGO: "Do you feel like either you or the room is spinning or tilting?" (i.e. vertigo)     No 4. SEVERITY: "How bad is it?"  "Do you feel like you are going to faint?" "Can you stand and walk?"   - MILD: Feels slightly dizzy, but walking normally.   - MODERATE: Feels unsteady when walking, but not falling; interferes with normal activities (e.g., school, work).   - SEVERE: Unable to walk without falling, or requires assistance to walk without falling; feels like passing out now.      mild 5. ONSET:  "When did the dizziness begin?"     2 days ago 6. AGGRAVATING FACTORS: "Does anything make it worse?" (e.g., standing, change in head position)    Better  once I drink water 7. HEART RATE: "Can you tell me your heart rate?" "How many beats in 15 seconds?"  (Note: not all patients can do this)        8. CAUSE: "What do you think is causing the dizziness?"     Unsure 9. RECURRENT SYMPTOM: "Have you had dizziness before?" If Yes, ask: "When was the last time?" "What happened that time?"     no 10. OTHER SYMPTOMS: "Do you have any other symptoms?" (e.g., fever, chest pain, vomiting, diarrhea, bleeding)       NO  Protocols used: Blood Pressure - High-A-AH, Dizziness - Lightheadedness-A-AH

## 2021-07-26 ENCOUNTER — Ambulatory Visit: Payer: Self-pay | Admitting: *Deleted

## 2021-07-26 NOTE — Telephone Encounter (Signed)
Pt has fatigue, and concerned for bp.  Yesterday it was 135/80.Marland Kitchen  wanted to come for bp check. Please advise   Called patient and communicated via sign language interpreter [phone]. Concerned BP too high and reviewed with patient BP WNL for hypertensive patient. Reports she took BP with wrist cuff yesterday at neighbors house. C/o fatigue x 2-3 days. Feels exhausted, no energy, "brain groggy" falls asleep 5 minutes and feels better. Feels "lazy" and doesn't want to do regular activities. Denies any other symptoms .  No stress. No appt available within the next 3 days . Patient would like BP checked by PCP. Recommended My Chart E visit or go to UC if symptoms persist. Care advise given. Patient verbalized understanding and to call back or go to UC if symptoms worsen.

## 2021-07-26 NOTE — Telephone Encounter (Signed)
Called patient with interpreter ID [phone] and a vm was left to call [phone] to schedule an appt.

## 2021-07-26 NOTE — Telephone Encounter (Signed)
Reason for Disposition  [1] MILD weakness (i.e., does not interfere with ability to work, go to school, normal activities) AND [2] persists > 1 week    Persists 2-3 days  Answer Assessment - Initial Assessment Questions 1. DESCRIPTION: "Describe how you are feeling."     Exhausted feeling  2. SEVERITY: "How bad is it?"  "Can you stand and walk?"   - MILD - Feels weak or tired, but does not interfere with work, school or normal activities   - MODERATE - Able to stand and walk; weakness interferes with work, school, or normal activities   - SEVERE - Unable to stand or walk     Moderate - able to do activities just doesn't want to do anything 3. ONSET:  "When did the weakness begin?"     2-3 days ago  4. CAUSE: "What do you think is causing the weakness?"     Not sure  5. MEDICINES: "Have you recently started a new medicine or had a change in the amount of a medicine?"     No  6. OTHER SYMPTOMS: "Do you have any other symptoms?" (e.g., chest pain, fever, cough, SOB, vomiting, diarrhea, bleeding, other areas of pain)     No , feeling "lazy"  7. PREGNANCY: "Is there any chance you are pregnant?" "When was your last menstrual period?"     na  Protocols used: Weakness (Generalized) and Fatigue-A-AH

## 2021-07-26 NOTE — Telephone Encounter (Signed)
Could you contact pt and schedule  

## 2021-07-29 NOTE — Telephone Encounter (Signed)
Contacted pt and scheduled a bp check with luke for 1/9 at 10am

## 2021-08-25 NOTE — Progress Notes (Signed)
S:    PCP: Dr. Laural Benes  Patient presents to the clinic for hypertension evaluation, counseling, and management. Patient was referred and last seen by Primary Care Provider on 06/21/21 and carvedilol was added for elevated BP. Patient called last month complaining of fatigue and wanted a BP check.   Patient arrives in good spirits accompanied by an in person ASL interpreter. She denies headache, dizziness, blurred vision, chest pain. Endorses some fatigue for the past month or so which comes and goes. She has been not been eating as good lately due to the holidays and has not been walking.   Medication adherence reported but she is only taking carvedilol once per day.   Current BP Medications include: amlodipine 10 mg daily, valsartan-HCTZ 320-25 mg daily, carvedilol 3.125 mg BID (taking daily)  Dietary habits include: denies salt intake Exercise habits include: has not been exercising for the past month or so, needs to start walking again Family / Social history: never smoker. Fhx heart disease and stroke in mother.   O:  Today's Vitals   08/26/21 1017  BP: (!) 146/83  Pulse: 66   Home BP readings: checked BP once about a month ago and it was 135/80  Last 3 Office BP readings: BP Readings from Last 3 Encounters:  08/26/21 (!) 146/83  06/21/21 (!) 152/77  05/29/21 (!) 178/81   BMET    Component Value Date/Time   NA 137 05/12/2021 1849   NA 141 04/25/2021 1128   NA 140 05/28/2012 1937   K 3.4 (L) 05/12/2021 1849   K 3.1 (L) 05/28/2012 1937   CL 101 05/12/2021 1849   CL 103 05/28/2012 1937   CO2 27 05/12/2021 1849   CO2 26 05/28/2012 1937   GLUCOSE 126 (H) 05/12/2021 1849   GLUCOSE 98 05/28/2012 1937   BUN 15 05/12/2021 1849   BUN 19 04/25/2021 1128   BUN 15 05/28/2012 1937   CREATININE 0.87 05/12/2021 1849   CREATININE 0.81 09/28/2015 1200   CALCIUM 9.4 05/12/2021 1849   CALCIUM 8.7 05/28/2012 1937   GFRNONAA >60 05/12/2021 1849   GFRNONAA 86 09/28/2015 1200    GFRAA 82 01/05/2020 0924   GFRAA >89 09/28/2015 1200   Renal function: CrCl cannot be calculated (Patient's most recent lab result is older than the maximum 21 days allowed.).  Clinical ASCVD: No  The 10-year ASCVD risk score (Arnett DK, et al., 2019) is: 11.4%   Values used to calculate the score:     Age: 13 years     Sex: Female     Is Non-Hispanic African American: Yes     Diabetic: No     Tobacco smoker: No     Systolic Blood Pressure: 146 mmHg     Is BP treated: Yes     HDL Cholesterol: 38 mg/dL     Total Cholesterol: 211 mg/dL  A/P: Hypertension currently uncontrolled on current medications. BP Goal = < 130/80 mmHg. Medication adherence reported but only taking carvedilol daily instead of BID. BP today is the best it has been since September 2021. Suspect that fatigue may be due to her body adjusting to having a BP that is trending down to goal and could also be from not exercising as regularly and not eating as well lately.  -Continue current medications as prescribed but educated patient to start taking carvedilol twice daily.  -Counseled on lifestyle modifications for blood pressure control including reduced dietary sodium, increased exercise, adequate sleep.  Total time in face-to-face  counseling 30 minutes. F/U Clinic Visit in 1 month with PCP.    Pervis Hocking, PharmD PGY2 Ambulatory Care Pharmacy Resident 08/26/2021 10:18 AM

## 2021-08-26 ENCOUNTER — Ambulatory Visit: Payer: Medicaid Other | Attending: Internal Medicine | Admitting: Pharmacist

## 2021-08-26 ENCOUNTER — Other Ambulatory Visit: Payer: Self-pay

## 2021-08-26 VITALS — BP 146/83 | HR 66

## 2021-08-26 DIAGNOSIS — I10 Essential (primary) hypertension: Secondary | ICD-10-CM | POA: Diagnosis not present

## 2021-09-26 ENCOUNTER — Encounter: Payer: Self-pay | Admitting: Internal Medicine

## 2021-09-26 ENCOUNTER — Ambulatory Visit: Payer: Medicaid Other | Attending: Internal Medicine | Admitting: Internal Medicine

## 2021-09-26 VITALS — BP 121/74 | HR 59 | Resp 16 | Wt 184.6 lb

## 2021-09-26 DIAGNOSIS — E669 Obesity, unspecified: Secondary | ICD-10-CM

## 2021-09-26 DIAGNOSIS — I10 Essential (primary) hypertension: Secondary | ICD-10-CM | POA: Diagnosis not present

## 2021-09-26 DIAGNOSIS — R059 Cough, unspecified: Secondary | ICD-10-CM

## 2021-09-26 DIAGNOSIS — E66811 Obesity, class 1: Secondary | ICD-10-CM

## 2021-09-26 MED ORDER — LORATADINE 10 MG PO TABS: 10.0000 mg | ORAL_TABLET | Freq: Every day | ORAL | 3 refills | Status: DC | PRN

## 2021-09-26 NOTE — Patient Instructions (Signed)
Healthy Eating °Following a healthy eating pattern may help you to achieve and maintain a healthy body weight, reduce the risk of chronic disease, and live a long and productive life. It is important to follow a healthy eating pattern at an appropriate calorie level for your body. Your nutritional needs should be met primarily through food by choosing a variety of nutrient-rich foods. °What are tips for following this plan? °Reading food labels °Read labels and choose the following: °Reduced or low sodium. °Juices with 100% fruit juice. °Foods with low saturated fats and high polyunsaturated and monounsaturated fats. °Foods with whole grains, such as whole wheat, cracked wheat, brown rice, and wild rice. °Whole grains that are fortified with folic acid. This is recommended for women who are pregnant or who want to become pregnant. °Read labels and avoid the following: °Foods with a lot of added sugars. These include foods that contain brown sugar, corn sweetener, corn syrup, dextrose, fructose, glucose, high-fructose corn syrup, honey, invert sugar, lactose, malt syrup, maltose, molasses, raw sugar, sucrose, trehalose, or turbinado sugar. °Do not eat more than the following amounts of added sugar per day: °6 teaspoons (25 g) for women. °9 teaspoons (38 g) for men. °Foods that contain processed or refined starches and grains. °Refined grain products, such as white flour, degermed cornmeal, white bread, and white rice. °Shopping °Choose nutrient-rich snacks, such as vegetables, whole fruits, and nuts. Avoid high-calorie and high-sugar snacks, such as potato chips, fruit snacks, and candy. °Use oil-based dressings and spreads on foods instead of solid fats such as butter, stick margarine, or cream cheese. °Limit pre-made sauces, mixes, and "instant" products such as flavored rice, instant noodles, and ready-made pasta. °Try more plant-protein sources, such as tofu, tempeh, black beans, edamame, lentils, nuts, and  seeds. °Explore eating plans such as the Mediterranean diet or vegetarian diet. °Cooking °Use oil to sauté or stir-fry foods instead of solid fats such as butter, stick margarine, or lard. °Try baking, boiling, grilling, or broiling instead of frying. °Remove the fatty part of meats before cooking. °Steam vegetables in water or broth. °Meal planning ° °At meals, imagine dividing your plate into fourths: °One-half of your plate is fruits and vegetables. °One-fourth of your plate is whole grains. °One-fourth of your plate is protein, especially lean meats, poultry, eggs, tofu, beans, or nuts. °Include low-fat dairy as part of your daily diet. °Lifestyle °Choose healthy options in all settings, including home, work, school, restaurants, or stores. °Prepare your food safely: °Wash your hands after handling raw meats. °Keep food preparation surfaces clean by regularly washing with hot, soapy water. °Keep raw meats separate from ready-to-eat foods, such as fruits and vegetables. °Cook seafood, meat, poultry, and eggs to the recommended internal temperature. °Store foods at safe temperatures. In general: °Keep cold foods at 40°F (4.4°C) or below. °Keep hot foods at 140°F (60°C) or above. °Keep your freezer at 0°F (-17.8°C) or below. °Foods are no longer safe to eat when they have been between the temperatures of 40°-140°F (4.4-60°C) for more than 2 hours. °What foods should I eat? °Fruits °Aim to eat 2 cup-equivalents of fresh, canned (in natural juice), or frozen fruits each day. Examples of 1 cup-equivalent of fruit include 1 small apple, 8 large strawberries, 1 cup canned fruit, ½ cup dried fruit, or 1 cup 100% juice. °Vegetables °Aim to eat 2½-3 cup-equivalents of fresh and frozen vegetables each day, including different varieties and colors. Examples of 1 cup-equivalent of vegetables include 2 medium carrots, 2 cups raw,   leafy greens, 1 cup chopped vegetable (raw or cooked), or 1 medium baked potato. °Grains °Aim to  eat 6 ounce-equivalents of whole grains each day. Examples of 1 ounce-equivalent of grains include 1 slice of bread, 1 cup ready-to-eat cereal, 3 cups popcorn, or ½ cup cooked rice, pasta, or cereal. °Meats and other proteins °Aim to eat 5-6 ounce-equivalents of protein each day. Examples of 1 ounce-equivalent of protein include 1 egg, 1/2 cup nuts or seeds, or 1 tablespoon (16 g) peanut butter. A cut of meat or fish that is the size of a deck of cards is about 3-4 ounce-equivalents. °Of the protein you eat each week, try to have at least 8 ounces come from seafood. This includes salmon, trout, herring, and anchovies. °Dairy °Aim to eat 3 cup-equivalents of fat-free or low-fat dairy each day. Examples of 1 cup-equivalent of dairy include 1 cup (240 mL) milk, 8 ounces (250 g) yogurt, 1½ ounces (44 g) natural cheese, or 1 cup (240 mL) fortified soy milk. °Fats and oils °Aim for about 5 teaspoons (21 g) per day. Choose monounsaturated fats, such as canola and olive oils, avocados, peanut butter, and most nuts, or polyunsaturated fats, such as sunflower, corn, and soybean oils, walnuts, pine nuts, sesame seeds, sunflower seeds, and flaxseed. °Beverages °Aim for six 8-oz glasses of water per day. Limit coffee to three to five 8-oz cups per day. °Limit caffeinated beverages that have added calories, such as soda and energy drinks. °Limit alcohol intake to no more than 1 drink a day for nonpregnant women and 2 drinks a day for men. One drink equals 12 oz of beer (355 mL), 5 oz of wine (148 mL), or 1½ oz of hard liquor (44 mL). °Seasoning and other foods °Avoid adding excess amounts of salt to your foods. Try flavoring foods with herbs and spices instead of salt. °Avoid adding sugar to foods. °Try using oil-based dressings, sauces, and spreads instead of solid fats. °This information is based on general [place] nutrition guidelines. For more information, visit choosemyplate.gov. Exact amounts may vary based on your nutrition  needs. °Summary °A healthy eating plan may help you to maintain a healthy weight, reduce the risk of chronic diseases, and stay active throughout your life. °Plan your meals. Make sure you eat the right portions of a variety of nutrient-rich foods. °Try baking, boiling, grilling, or broiling instead of frying. °Choose healthy options in all settings, including home, work, school, restaurants, or stores. °This information is not intended to replace advice given to you by your health care provider. Make sure you discuss any questions you have with your health care provider. °Document Revised: 04/02/2021 Document Reviewed: 04/02/2021 °Elsevier Patient Education © 2022 [employer] Inc. ° °

## 2021-09-26 NOTE — Progress Notes (Signed)
Patient ID: Caitlin [name], female    DOB: June 17, 1966  MRN: [id number]  CC: Hypertension and Gastroesophageal Reflux   Subjective: Caitlin [name] is a 56 y.o. female who presents for f/u BP.  Last saw me 06/2021. Caitlin [name] from [facility] is with her and interprets using sign languange. Her concerns today include:  Pt with hx of HTN, GERD, obesity, asthma   HTN: Patient on Norvasc at nights and Diovan/HCTZ in the AM.  On last visit with me I added carvedilol 3.125 mg twice a day.  She followed up with the clinical pharmacist 1 month ago.  Blood pressure was still elevated.  It turns out that she was taking the carvedilol once a day instead of twice a day. -reports compliance with meds since then No device to check BP Limits salt in foods  Obesity:  wgh down 11 lbs since 04/2021.  She tells me this is intentional weight loss.  She has been working on her eating habits.  She eats 3 meals a day and rarely snacks in between.  She walks 2 to 3 days a week for 30 minutes.  She has been thinking about starting [employer] to help achieve further wgh lose.  Drinks mainly water and has a Diet Coke every now and then.    Has cough productive of white phlegm. Has a sticky feeling in throat after she coughs x 2 wks.  Getting better.  No problems swallowing but sometimes she feels she has to cough something up.  Taking Omeprazole at nights.  She feels her acid reflux is under good control with it..  Endorses drainage at back of throat.  No nasal congestion  Patient Active Problem List   Diagnosis Date Noted   Viral URI with cough 09/28/2015   Obesity (BMI 30.0-34.9) 11/20/2014   Menopausal symptoms 07/27/2014   Hair loss 05/01/2014   Onychomycosis 05/01/2014   HTN (hypertension) 03/06/2012   GERD (gastroesophageal reflux disease) 03/06/2012     Current Outpatient Medications on File Prior to Visit  Medication Sig Dispense Refill   albuterol (VENTOLIN HFA) 108 (90 Base) MCG/ACT inhaler Inhale 2  puffs into the lungs every 6 (six) hours as needed for wheezing or shortness of breath. 18 g 3   amLODipine (NORVASC) 10 MG tablet Take 1 tablet (10 mg total) by mouth daily. To lower blood pressure 90 tablet 1   atorvastatin (LIPITOR) 10 MG tablet Take 1 tablet (10 mg total) by mouth daily. 30 tablet 2   Blood Pressure Monitor KIT Use to check blood pressure daily 1 kit 0   carvedilol (COREG) 3.125 MG tablet Take 1 tablet (3.125 mg total) by mouth 2 (two) times daily with a meal. 60 tablet 3   fluticasone (FLOVENT HFA) 110 MCG/ACT inhaler Inhale 2 puffs into the lungs 2 (two) times daily. 12 g 5   Misc. Devices MISC CPAP therapy on 17 cm H2O . Patient to use X-Small size Resmed Full Face Mask AirFit F10. Diagnosis - Obstructive sleep apnea 1 each 0   nystatin (MYCOSTATIN/NYSTOP) powder Apply 1 application topically 2 (two) times daily. Apply to affected area under breast 15 g 0   Olopatadine HCl 0.7 % SOLN 1 drop each eye daily as needed for itchy/watery/red eyes 2.5 mL 5   omeprazole (PRILOSEC) 20 MG capsule Take 1 capsule (20 mg total) by mouth daily. To decrease stomach acid 30 capsule 3   traZODone (DESYREL) 50 MG tablet Take 0.5-1 tablets (25-50 mg total) by mouth  at bedtime as needed for sleep. 30 tablet 3   triamcinolone (NASACORT) 55 MCG/ACT AERO nasal inhaler Place 2 sprays into the nose daily. 16.5 g 5   valsartan-hydrochlorothiazide (DIOVAN-HCT) 320-25 MG tablet Take 1 tablet by mouth daily. 90 tablet 1   Vitamin D, Ergocalciferol, (DRISDOL) 1.25 MG (50000 UNIT) CAPS capsule Take 1 capsule (50,000 Units total) by mouth every 7 (seven) days. 16 capsule 0   No current facility-administered medications on file prior to visit.    Allergies  Allergen Reactions   Eggs Or Egg-Derived Products Nausea And Vomiting   Milk-Related Compounds     Social History   Socioeconomic History   Marital status: Married    Spouse name: Not on file   Number of children: 3    Years of education: Not  on file   Highest education level: Not on file  Occupational History    Employer: UNEMPLOYED  Tobacco Use   Smoking status: Never   Smokeless tobacco: Never  Vaping Use   Vaping Use: Never used  Substance and Sexual Activity   Alcohol use: Yes    Comment: occassionally    Drug use: No   Sexual activity: Yes    Birth control/protection: Surgical  Other Topics Concern   Not on file  Social History Narrative   Not on file   Social Determinants of Health   Financial Resource Strain: Not on file  Food Insecurity: Not on file  Transportation Needs: Not on file  Physical Activity: Not on file  Stress: Not on file  Social Connections: Not on file  Intimate Partner Violence: Not on file    Family History  Problem Relation Age of Onset   Heart disease Mother    Stroke Mother     Past Surgical History:  Procedure Laterality Date   CESAREAN SECTION     CHOLECYSTECTOMY     TUBAL LIGATION      ROS: Review of Systems Negative except as stated above  PHYSICAL EXAM: BP 121/74    Pulse (!) 59    Resp 16    Wt 184 lb 9.6 oz (83.7 kg)    LMP 05/08/2016    SpO2 98%    BMI 33.76 kg/m   Wt Readings from Last 3 Encounters:  09/26/21 184 lb 9.6 oz (83.7 kg)  06/21/21 187 lb 6.4 oz (85 kg)  04/25/21 195 lb (88.5 kg)    Physical Exam  General appearance - alert, well appearing, middle-age African-American female and in no distress Mental status -patient answers questions appropriately. Mouth -oral mucosa is moist.  Throat is clear without erythema or exudates.  No drainage of mucus noted at the back of the throat. Neck -no thyroid enlargement.  No cervical lymphadenopathy. Chest - clear to auscultation, no wheezes, rales or rhonchi, symmetric air entry Heart - normal rate, regular rhythm, normal S1, S2, no murmurs, rubs, clicks or gallops Extremities - peripheral pulses normal, no pedal edema, no clubbing or cyanosis   CMP Latest Ref Rng & Units 05/12/2021 04/25/2021 01/05/2020   Glucose 70 - 99 mg/dL 126(H) 100(H) 100(H)  BUN 6 - 20 mg/dL '15 19 21  ' Creatinine 0.44 - 1.00 mg/dL 0.87 0.87 0.92  Sodium 135 - 145 mmol/L 137 141 141  Potassium 3.5 - 5.1 mmol/L 3.4(L) 3.9 4.5  Chloride 98 - 111 mmol/L 101 101 98  CO2 22 - 32 mmol/L '27 26 25  ' Calcium 8.9 - 10.3 mg/dL 9.4 9.9 9.9  Total Protein 6.5 - 8.1  g/dL 7.8 7.8 -  Total Bilirubin 0.3 - 1.2 mg/dL 0.8 0.6 -  Alkaline Phos 38 - 126 U/L 71 94 -  AST 15 - 41 U/L 25 22 -  ALT 0 - 44 U/L 25 26 -   Lipid Panel     Component Value Date/Time   CHOL 211 (H) 04/25/2021 1128   TRIG 122 04/25/2021 1128   HDL 38 (L) 04/25/2021 1128   CHOLHDL 5.6 (H) 04/25/2021 1128   CHOLHDL 4.0 03/06/2012 0542   VLDL 7 03/06/2012 0542   LDLCALC 151 (H) 04/25/2021 1128    CBC    Component Value Date/Time   WBC 5.4 05/12/2021 1849   RBC 4.37 05/12/2021 1849   HGB 12.9 05/12/2021 1849   HGB 12.8 04/25/2021 1128   HCT 39.1 05/12/2021 1849   HCT 38.8 04/25/2021 1128   PLT 395 05/12/2021 1849   PLT 385 04/25/2021 1128   MCV 89.5 05/12/2021 1849   MCV 88 04/25/2021 1128   MCV 90 05/28/2012 1937   MCH 29.5 05/12/2021 1849   MCHC 33.0 05/12/2021 1849   RDW 12.2 05/12/2021 1849   RDW 12.4 04/25/2021 1128   RDW 13.1 05/28/2012 1937   LYMPHSABS 1.9 05/12/2021 1849   LYMPHSABS 2.0 06/16/2019 1012   MONOABS 0.4 05/12/2021 1849   EOSABS 0.1 05/12/2021 1849   EOSABS 0.1 06/16/2019 1012   BASOSABS 0.0 05/12/2021 1849   BASOSABS 0.0 06/16/2019 1012    ASSESSMENT AND PLAN: 1. Essential hypertension At goal.  Continue carvedilol, Diovan/HCTZ and Norvasc.  Pulse rate slightly low due to the carvedilol but we can observe for now.  2. Cough, unspecified type Sounds like she may have postnasal drip.  I recommend Claritin to use as needed - loratadine (CLARITIN) 10 MG tablet; Take 1 tablet (10 mg total) by mouth daily as needed for allergies. For cough associated with postnasal drip.  Dispense: 30 tablet; Refill: 3  3. Obesity (BMI  30.0-34.9) Commended her on weight loss so far.  Encouraged her to keep up the good work.  Encouraged her to get in several servings of fresh fruits daily.  Incorporate fresh vegetables into her diet with at least one of her meals daily.  Advised changing to whole-grain wheat bread.  Continue regular exercise.    Patient was given the opportunity to ask questions.  Patient verbalized understanding of the plan and was able to repeat key elements of the plan.   No orders of the defined types were placed in this encounter.    Requested Prescriptions   Signed Prescriptions Disp Refills   loratadine (CLARITIN) 10 MG tablet 30 tablet 3    Sig: Take 1 tablet (10 mg total) by mouth daily as needed for allergies. For cough associated with postnasal drip.    Return in about 4 months (around 01/24/2022).  Karle Plumber, MD, FACP

## 2021-12-07 ENCOUNTER — Other Ambulatory Visit: Payer: Self-pay | Admitting: Internal Medicine

## 2021-12-07 DIAGNOSIS — I10 Essential (primary) hypertension: Secondary | ICD-10-CM

## 2021-12-09 NOTE — Telephone Encounter (Signed)
Requested medication (s) are due for refill today: Yes ? ?Requested medication (s) are on the active medication list: Yes ? ?Last refill:  04/25/21 ? ?Future visit scheduled: Yes ? ?Notes to clinic:  Protocol indicates pt. Needs lab work. ? ? ? ?Requested Prescriptions  ?Pending Prescriptions Disp Refills  ? valsartan-hydrochlorothiazide (DIOVAN-HCT) 320-25 MG tablet [Pharmacy Med Name: Valsartan-hydroCHLOROthiazide 320-25 MG Oral Tablet] 90 tablet 0  ?  Sig: Take 1 tablet by mouth once daily  ?  ? Cardiovascular: ARB + Diuretic Combos Failed - 12/07/2021  9:18 AM  ?  ?  Failed - K in normal range and within 180 days  ?  Potassium  ?Date Value Ref Range Status  ?05/12/2021 3.4 (L) 3.5 - 5.1 mmol/L Final  ?05/28/2012 3.1 (L) 3.5 - 5.1 mmol/L Final  ?  ?  ?  ?  Failed - Na in normal range and within 180 days  ?  Sodium  ?Date Value Ref Range Status  ?05/12/2021 137 135 - 145 mmol/L Final  ?04/25/2021 141 134 - 144 mmol/L Final  ?05/28/2012 140 136 - 145 mmol/L Final  ?  ?  ?  ?  Failed - Cr in normal range and within 180 days  ?  Creat  ?Date Value Ref Range Status  ?09/28/2015 0.81 0.50 - 1.10 mg/dL Final  ? ?Creatinine, Ser  ?Date Value Ref Range Status  ?05/12/2021 0.87 0.44 - 1.00 mg/dL Final  ?  ?  ?  ?  Failed - eGFR is 10 or above and within 180 days  ?  GFR, Est African American  ?Date Value Ref Range Status  ?09/28/2015 >89 >=60 mL/min Final  ? ?GFR calc Af Amer  ?Date Value Ref Range Status  ?01/05/2020 82 >59 mL/min/1.73 Final  ?  Comment:  ?  **[facility] currently reports eGFR in compliance with the current** ?  recommendations of the [employer]. [facility] will ?  update reporting as new guidelines are published from the NKF-ASN ?  Task force. ?  ? ?GFR, Est Non African American  ?Date Value Ref Range Status  ?09/28/2015 86 >=60 mL/min Final  ?  Comment:  ?    ?The estimated GFR is a calculation valid for adults (>=66 years old) ?that uses the CKD-EPI algorithm to adjust for age and sex. It is    ?not to be used for children, pregnant women, hospitalized patients,    ?patients on dialysis, or with rapidly changing kidney function. ?According to the NKDEP, eGFR >89 is normal, 60-89 shows mild ?impairment, 30-59 shows moderate impairment, 15-29 shows severe ?impairment and <15 is ESRD. ?  ?  ? ?GFR, Estimated  ?Date Value Ref Range Status  ?05/12/2021 >60 >60 mL/min Final  ?  Comment:  ?  (NOTE) ?Calculated using the CKD-EPI Creatinine Equation (2021) ?  ? ?eGFR  ?Date Value Ref Range Status  ?04/25/2021 79 >59 mL/min/1.73 Final  ?  ?  ?  ?  Passed - Patient is not pregnant  ?  ?  Passed - Last BP in normal range  ?  BP Readings from Last 1 Encounters:  ?09/26/21 121/74  ?  ?  ?  ?  Passed - Valid encounter within last 6 months  ?  Recent Outpatient Visits   ? ?      ? 2 months ago Essential hypertension  ? [place] Ladell Pier, MD  ? 3 months ago Primary hypertension  ? [place], RPH-CPP  ? 5 months ago Essential hypertension  ? [place] Ladell Pier, MD  ? 6 months ago Accelerated hypertension  ? [place], Charlane Ferretti, MD  ? 7 months ago Dyspepsia  ? [place] Ladell Pier, MD  ? ?  ?  ?Future Appointments   ? ?        ? In 2 weeks Ladell Pier, MD [place]  ? ?  ? ? ?  ?  ?  ? ?

## 2021-12-10 IMAGING — MG MM DIGITAL SCREENING BILAT W/ TOMO AND CAD
8 of 14 series · 8 of 40 positions shown · non-contrast
Comparison: Previous exam(s).

CLINICAL DATA: Screening.

EXAM:
DIGITAL SCREENING BILATERAL MAMMOGRAM WITH TOMOSYNTHESIS AND CAD
TECHNIQUE: Bilateral screening digital craniocaudal and mediolateral oblique
mammograms were obtained. Bilateral screening digital breast
tomosynthesis was performed. The images were evaluated with
computer-aided detection.

[L MLO synth-2D]
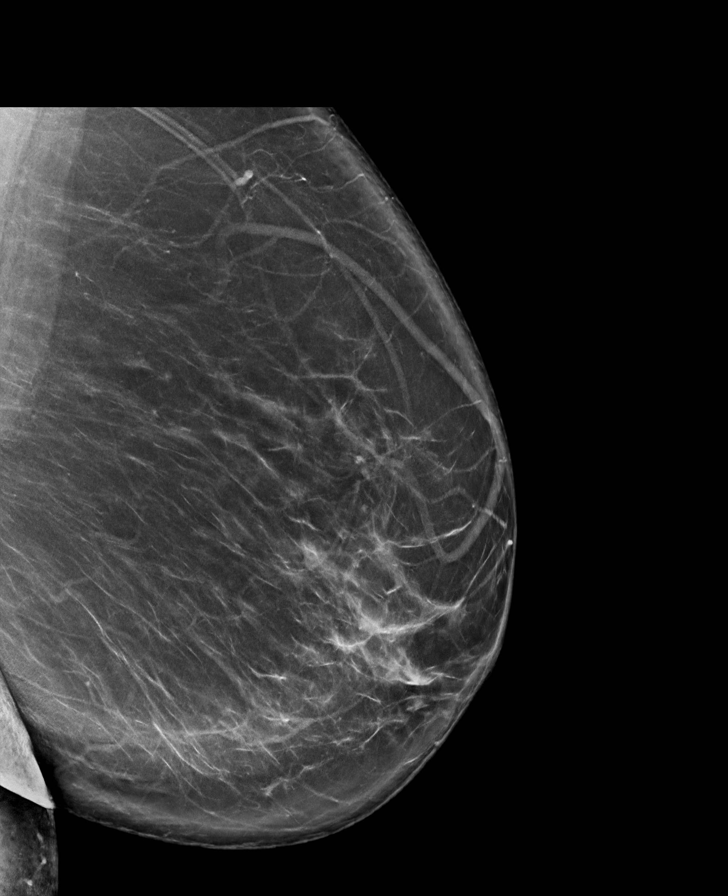

[R MLO synth-2D (1 of 2)]
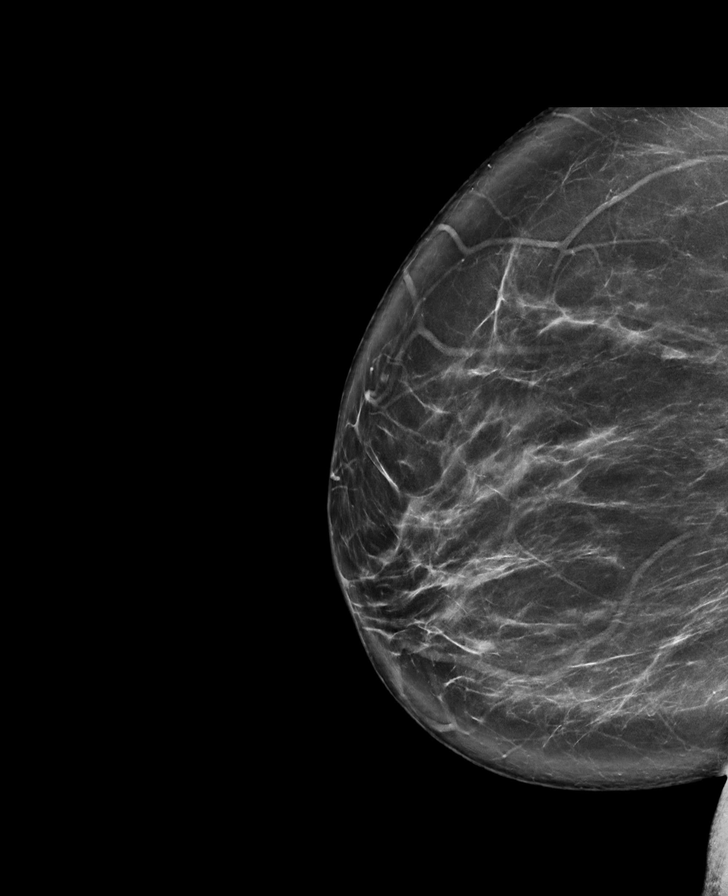

[R MLO synth-2D (2 of 2)]
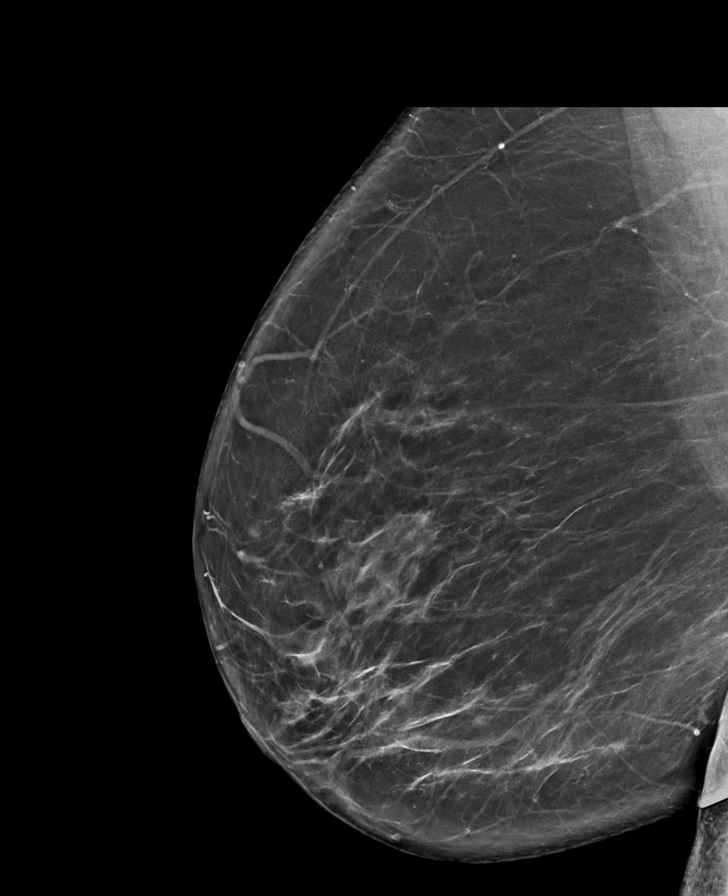

[R CV synth-2D]
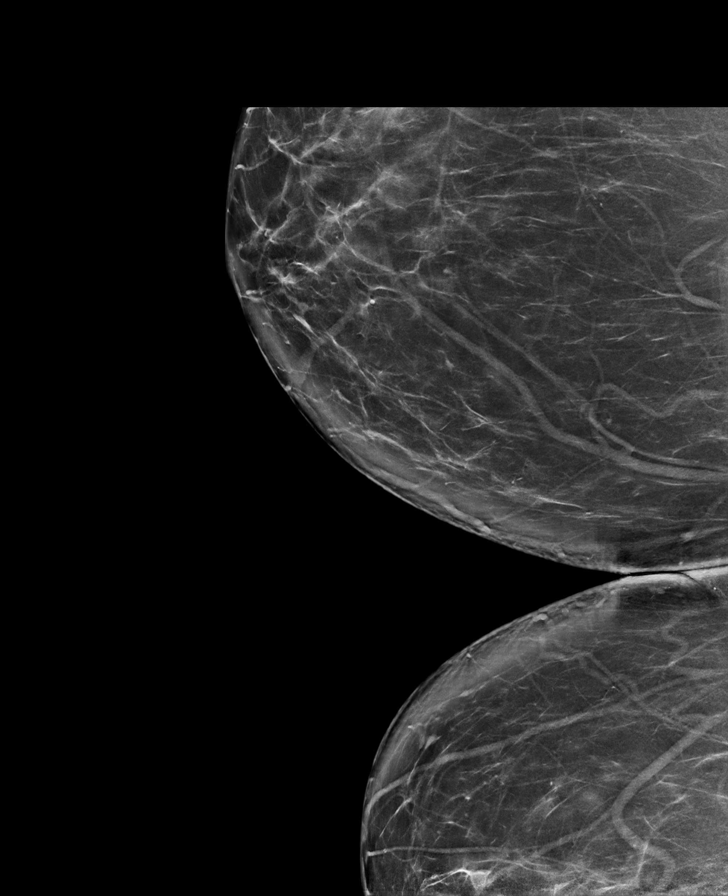

[L CC synth-2D (1 of 2)]
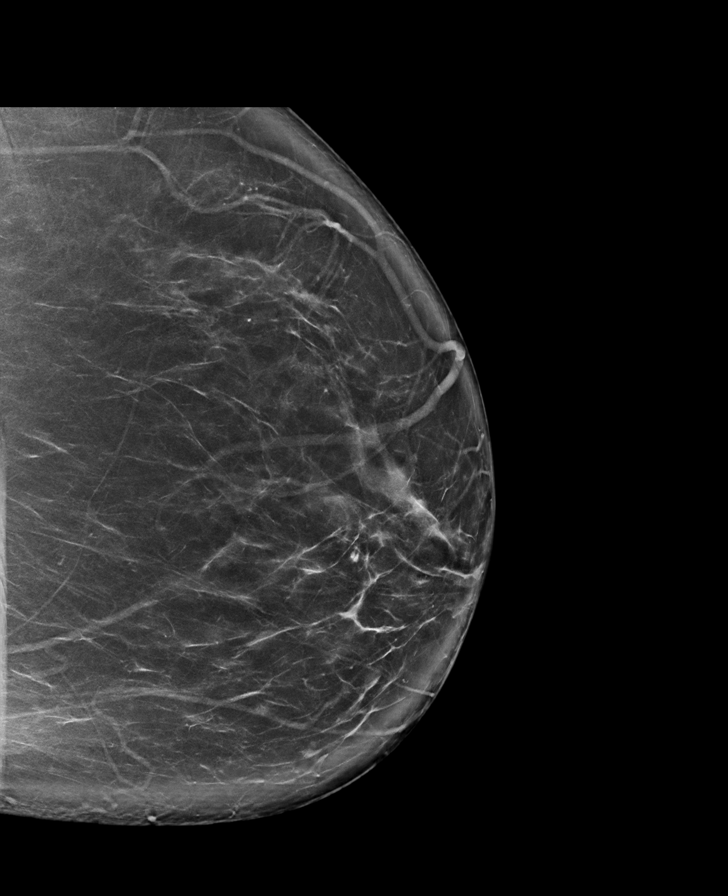

[R CC synth-2D]
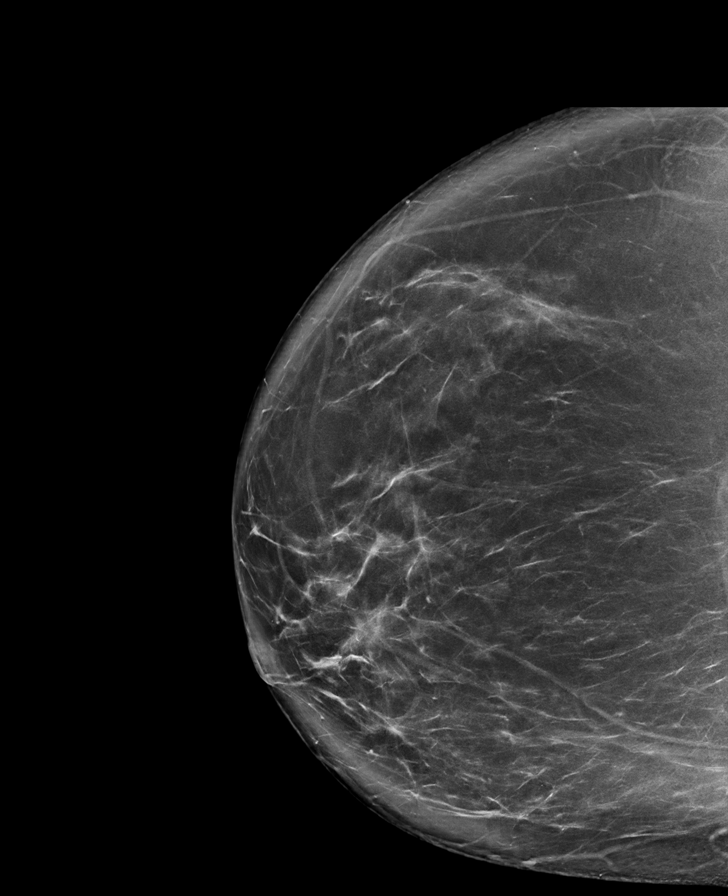

[L CC synth-2D (2 of 2)]
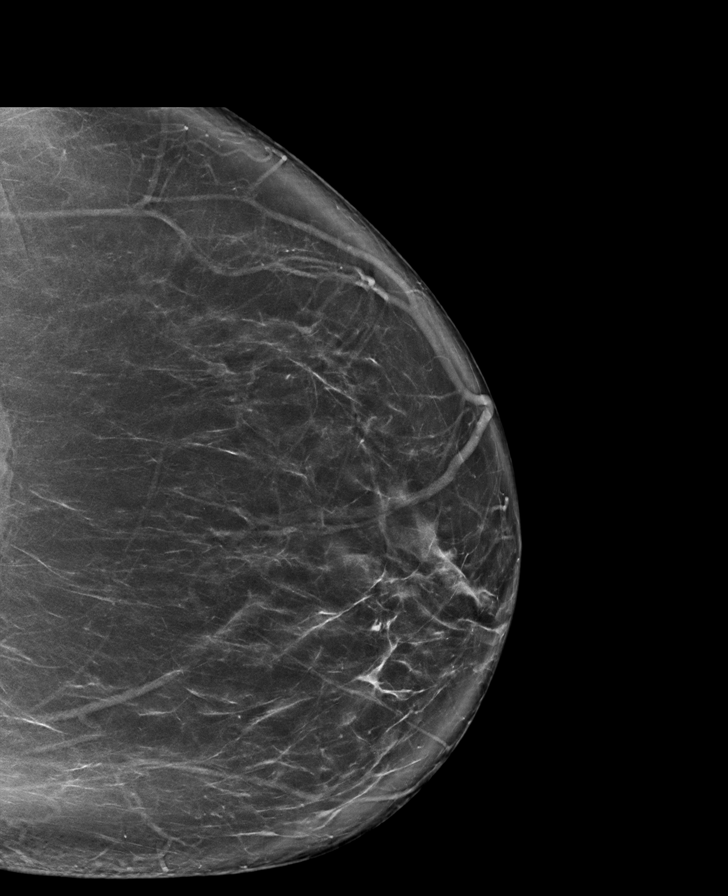

[R CC tomo · tomo slice 51/100.0]
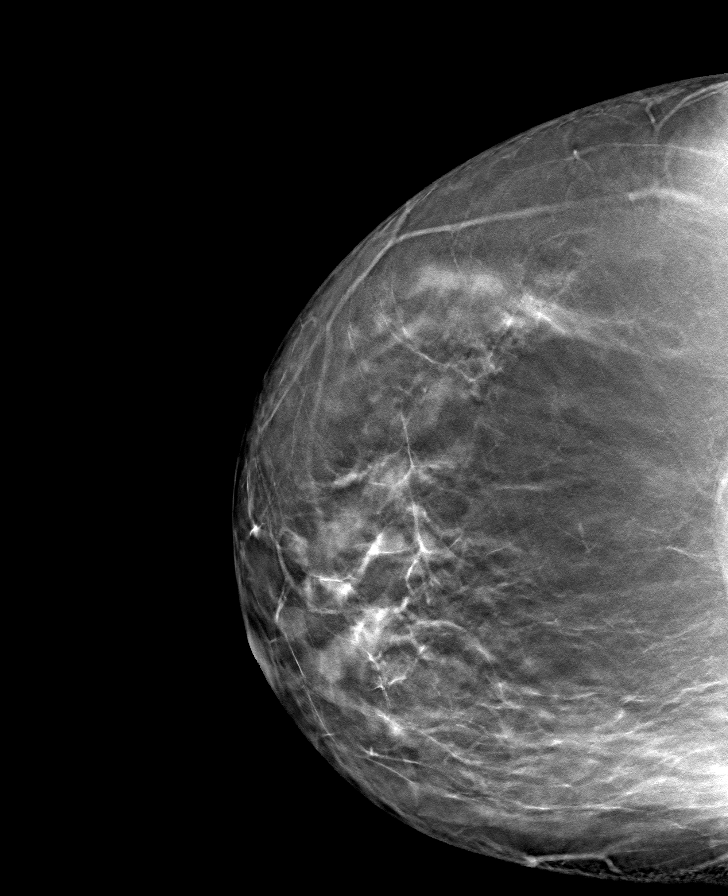

[8 of 40 positions shown; findings below may reference images not displayed]

ACR Breast Density Category b: There are scattered areas of
fibroglandular density.
FINDINGS: There are no findings suspicious for malignancy.
IMPRESSION: No mammographic evidence of malignancy. A result letter of this
screening mammogram will be mailed directly to the patient.

RECOMMENDATION:
Screening mammogram in one year. (Code:51-O-LD2)

BI-RADS CATEGORY  1: Negative.

## 2021-12-12 ENCOUNTER — Ambulatory Visit (INDEPENDENT_AMBULATORY_CARE_PROVIDER_SITE_OTHER): Payer: Medicaid Other | Admitting: Allergy

## 2021-12-12 ENCOUNTER — Encounter: Payer: Self-pay | Admitting: Allergy

## 2021-12-12 VITALS — BP 122/76 | HR 87 | Temp 97.5°F | Resp 16 | Ht 62.0 in | Wt 187.5 lb

## 2021-12-12 DIAGNOSIS — H1013 Acute atopic conjunctivitis, bilateral: Secondary | ICD-10-CM

## 2021-12-12 DIAGNOSIS — K2100 Gastro-esophageal reflux disease with esophagitis, without bleeding: Secondary | ICD-10-CM | POA: Diagnosis not present

## 2021-12-12 DIAGNOSIS — J4540 Moderate persistent asthma, uncomplicated: Secondary | ICD-10-CM

## 2021-12-12 DIAGNOSIS — J3089 Other allergic rhinitis: Secondary | ICD-10-CM

## 2021-12-12 MED ORDER — LEVOCETIRIZINE DIHYDROCHLORIDE 5 MG PO TABS: 5.0000 mg | ORAL_TABLET | Freq: Every evening | ORAL | 5 refills | Status: DC

## 2021-12-12 MED ORDER — ALBUTEROL SULFATE HFA 108 (90 BASE) MCG/ACT IN AERS: 2.0000 | INHALATION_SPRAY | Freq: Four times a day (QID) | RESPIRATORY_TRACT | 1 refills | Status: DC | PRN

## 2021-12-12 MED ORDER — OLOPATADINE HCL 0.2 % OP SOLN: 1.0000 [drp] | Freq: Every day | OPHTHALMIC | 5 refills | Status: DC | PRN

## 2021-12-12 MED ORDER — BUDESONIDE-FORMOTEROL FUMARATE 80-4.5 MCG/ACT IN AERO: 2.0000 | INHALATION_SPRAY | Freq: Two times a day (BID) | RESPIRATORY_TRACT | 5 refills | Status: DC

## 2021-12-12 NOTE — Progress Notes (Signed)
? ? ?Follow-up Note ? ?RE: Caitlin [name] MRN: [id number] DOB: 1966/06/26 ?Date of Office Visit: 12/12/2021 ? ? ?History of present illness: ?Caitlin [name] is a 56 y.o. female presenting today for follow-up of asthma, allergic rhinitis with conjunctivitis and reflux.  She was last seen in the office on 05/03/2020 by myself. ?She presents today with ASL interpreter. ?She has not had any major health changes, surgeries or hospitalizations since her last visit. ? ?With her asthma she states she can get short of breath with activity like exercise.  She does try to exercise at least 3 days a week more.  Plan for activity includes walking.  She may need to use her albuterol to remove these activities.  She usually uses her albuterol prior to activity and then may need to use it another time during the day for total of twice a day use.  She is using the Flovent 3 puffs twice a day.  She has not required any urgent care visits or any systemic steroid use for her asthma since last visit. ? ?She does report symptoms of itchy/watery eyes, post-nasal drip and sneezing.  She states pollen is really affecting her.  She is using Claritin daily and does not feel like it helps.  She has not been using any nasal sprays as she does not like the feeling or taste of no sprays.  She has not had access to olopatadine as it was not provided from her pharmacy. ? ?She states she does have reflux symptoms that can have heartburn and brash taste in her mouth.  She does note that tomato products seem to worsen her reflux.  She does eat sometimes late at night prior to bed but this is not often.  She tries not to do this.  She has used omeprazole in the past and does not feel this has been helpful for reflux control. ? ?Review of systems: ?Review of Systems  ?Constitutional: Negative.   ?HENT: Negative.    ?Eyes: Negative.   ?Respiratory: Negative.    ?Cardiovascular: Negative.   ?Gastrointestinal: Negative.   ?Musculoskeletal: Negative.    ?Skin: Negative.   ?Allergic/Immunologic: Negative.   ?Neurological: Negative.    ? ?All other systems negative unless noted above in HPI ? ?Past medical/social/surgical/family history have been reviewed and are unchanged unless specifically indicated below. ? ?No changes ? ?Medication List: ?Current Outpatient Medications  ?Medication Sig Dispense Refill  ? amLODipine (NORVASC) 10 MG tablet Take 1 tablet (10 mg total) by mouth daily. To lower blood pressure 90 tablet 1  ? atorvastatin (LIPITOR) 10 MG tablet Take 1 tablet (10 mg total) by mouth daily. 30 tablet 2  ? Blood Pressure Monitor KIT Use to check blood pressure daily 1 kit 0  ? budesonide-formoterol (SYMBICORT) 80-4.5 MCG/ACT inhaler Inhale 2 puffs into the lungs in the morning and at bedtime. 1 each 5  ? carvedilol (COREG) 3.125 MG tablet Take 1 tablet (3.125 mg total) by mouth 2 (two) times daily with a meal. 60 tablet 3  ? loratadine (CLARITIN) 10 MG tablet Take 1 tablet (10 mg total) by mouth daily as needed for allergies. For cough associated with postnasal drip. 30 tablet 3  ? Misc. Devices MISC CPAP therapy on 17 cm H2O . Patient to use X-Small size Resmed Full Face Mask AirFit F10. Diagnosis - Obstructive sleep apnea 1 each 0  ? nystatin (MYCOSTATIN/NYSTOP) powder Apply 1 application topically 2 (two) times daily. Apply to affected area under breast 15 g  0  ? Olopatadine HCl 0.2 % SOLN Apply 1 drop to eye daily as needed (itchy/watery eyes). 2.5 mL 5  ? omeprazole (PRILOSEC) 20 MG capsule Take 1 capsule (20 mg total) by mouth daily. To decrease stomach acid 30 capsule 3  ? traZODone (DESYREL) 50 MG tablet Take 0.5-1 tablets (25-50 mg total) by mouth at bedtime as needed for sleep. 30 tablet 3  ? valsartan-hydrochlorothiazide (DIOVAN-HCT) 320-25 MG tablet Take 1 tablet by mouth once daily 90 tablet 0  ? Vitamin D, Ergocalciferol, (DRISDOL) 1.25 MG (50000 UNIT) CAPS capsule Take 1 capsule (50,000 Units total) by mouth every 7 (seven) days. 16  capsule 0  ? albuterol (VENTOLIN HFA) 108 (90 Base) MCG/ACT inhaler Inhale 2 puffs into the lungs every 6 (six) hours as needed for wheezing or shortness of breath. 18 g 1  ? triamcinolone (NASACORT) 55 MCG/ACT AERO nasal inhaler Place 2 sprays into the nose daily. (Patient not taking: Reported on 12/12/2021) 16.5 g 5  ? ?No current facility-administered medications for this visit.  ?  ? ?Known medication allergies: ?Allergies  ?Allergen Reactions  ? Eggs Or Egg-Derived Products Nausea And Vomiting  ? Milk-Related Compounds   ? ? ? ?Physical examination: ?Blood pressure 122/76, pulse 87, temperature (!) 97.5 ?F (36.4 ?C), resp. rate 16, height _0  (1.575 m), weight 187 lb 8 oz (85 kg), last menstrual period 05/08/2016, SpO2 97 %. ? ?General: Alert, interactive, in no acute distress. ?HEENT: PERRLA, TMs pearly gray, turbinates non-edematous without discharge, post-pharynx non erythematous. ?Neck: Supple without lymphadenopathy. ?Lungs: Clear to auscultation without wheezing, rhonchi or rales. {no increased work of breathing. ?CV: Normal S1, S2 without murmurs. ?Abdomen: Nondistended, nontender. ?Skin: Warm and dry, without lesions or rashes. ?Extremities:  No clubbing, cyanosis or edema. ?Neuro:   Grossly intact. ? ?Diagnositics/Labs: ? ?Spirometry: FEV1: 2.02L 95%, FVC: 2.16L 82%, ratio consistent with nonobstructive pattern ? ? ?Assessment and plan: ?  ?Asthma ? - lung function testing is normal! ? - you have a component of exercise induced asthma ? - have access to albuterol inhaler 2 puffs every 4-6 hours as needed for cough/wheeze/shortness of breath/chest tightness.  May use 15-20 minutes prior to activity.   Monitor frequency of use.   ? - stop Flovent.  Start Symbicort 36mg 2 puffs twice a day ? - use inhalers with spacer device  ? ?Asthma control goals:  ?Full participation in all desired activities (may need albuterol before activity) ?Albuterol use two time or less a week on average (not counting use  with activity) ?Cough interfering with sleep two time or less a month ?Oral steroids no more than once a year ?No hospitalizations ? ?Allergic rhinitis with conjunctivitis ? - continue avoidance measures for grasses, weeds, trees, molds, dust mite cat and cockroach ? - stop Claritin .  Start Xyzal 566mdaily.   ? - use nasal saline spray to help moisturize nose and flush out allergens, germs etc from the nose   ? - use Olapatadine 0.2% 1 drop each eye daily as needed for itchy/watery/red eyes ? - if medication management is ineffective then consider course of allergen immunotherapy (allergy shots) ? ?Reflux ? - use Pepcid 2050m tab twice a day ? ?Follow-up 4 months or sooner if needed ? ?I appreciate the opportunity to take part in Caitlin [name]'s care. Please do not hesitate to contact me with questions. ? ?Sincerely, ? ? ?ShaPrudy FeelerD ?Allergy/Immunology ?[facility] ? ? ?

## 2021-12-12 NOTE — Patient Instructions (Addendum)
Asthma ? - lung function testing is normal! ? - you have a component of exercise induced asthma ? - have access to albuterol inhaler 2 puffs every 4-6 hours as needed for cough/wheeze/shortness of breath/chest tightness.  May use 15-20 minutes prior to activity.   Monitor frequency of use.   ? - stop Flovent.  Start Symbicort 2 puffs twice a day ? - use inhalers with spacer device  ? ?Asthma control goals:  ?Full participation in all desired activities (may need albuterol before activity) ?Albuterol use two time or less a week on average (not counting use with activity) ?Cough interfering with sleep two time or less a month ?Oral steroids no more than once a year ?No hospitalizations ? ?Allergies ? - continue avoidance measures for grasses, weeds, trees, molds, dust mite cat and cockroach ? - stop Claritin .  Start Xyzal 5mg  daily.   ? - use nasal saline spray to help moisturize nose and flush out allergens, germs etc from the nose   ? - use Olapatadine 0.2% 1 drop each eye daily as needed for itchy/watery/red eyes ? - if medication management is ineffective then consider course of allergen immunotherapy (allergy shots) ? ?Reflux ? - use Pepcid 20mg  1 tab twice a day ? ?Follow-up 4 months or sooner if needed ?

## 2021-12-24 ENCOUNTER — Ambulatory Visit: Payer: Medicaid Other | Attending: Internal Medicine | Admitting: Internal Medicine

## 2021-12-24 ENCOUNTER — Encounter: Payer: Self-pay | Admitting: Internal Medicine

## 2021-12-24 VITALS — BP 118/77 | HR 65 | Temp 98.2°F | Resp 16 | Ht 63.0 in | Wt 188.0 lb

## 2021-12-24 DIAGNOSIS — I10 Essential (primary) hypertension: Secondary | ICD-10-CM | POA: Diagnosis not present

## 2021-12-24 DIAGNOSIS — E669 Obesity, unspecified: Secondary | ICD-10-CM

## 2021-12-24 NOTE — Patient Instructions (Signed)
Healthy Eating ?Following a healthy eating pattern may help you to achieve and maintain a healthy body weight, reduce the risk of chronic disease, and live a long and productive life. It is important to follow a healthy eating pattern at an appropriate calorie level for your body. Your nutritional needs should be met primarily through food by choosing a variety of nutrient-rich foods. ?What are tips for following this plan? ?Reading food labels ?Read labels and choose the following: ?Reduced or low sodium. ?Juices with 100% fruit juice. ?Foods with low saturated fats and high polyunsaturated and monounsaturated fats. ?Foods with whole grains, such as whole wheat, cracked wheat, brown rice, and wild rice. ?Whole grains that are fortified with folic acid. This is recommended for women who are pregnant or who want to become pregnant. ?Read labels and avoid the following: ?Foods with a lot of added sugars. These include foods that contain brown sugar, corn sweetener, corn syrup, dextrose, fructose, glucose, high-fructose corn syrup, honey, invert sugar, lactose, malt syrup, maltose, molasses, raw sugar, sucrose, trehalose, or turbinado sugar. ?Do not eat more than the following amounts of added sugar per day: ?6 teaspoons (25 g) for women. ?9 teaspoons (38 g) for men. ?Foods that contain processed or refined starches and grains. ?Refined grain products, such as white flour, degermed cornmeal, white bread, and white rice. ?Shopping ?Choose nutrient-rich snacks, such as vegetables, whole fruits, and nuts. Avoid high-calorie and high-sugar snacks, such as potato chips, fruit snacks, and candy. ?Use oil-based dressings and spreads on foods instead of solid fats such as butter, stick margarine, or cream cheese. ?Limit pre-made sauces, mixes, and "instant" products such as flavored rice, instant noodles, and ready-made pasta. ?Try more plant-protein sources, such as tofu, tempeh, black beans, edamame, lentils, nuts, and  seeds. ?Explore eating plans such as the Mediterranean diet or vegetarian diet. ?Cooking ?Use oil to saut? or stir-fry foods instead of solid fats such as butter, stick margarine, or lard. ?Try baking, boiling, grilling, or broiling instead of frying. ?Remove the fatty part of meats before cooking. ?Steam vegetables in water or broth. ?Meal planning ? ?At meals, imagine dividing your plate into fourths: ?One-half of your plate is fruits and vegetables. ?One-fourth of your plate is whole grains. ?One-fourth of your plate is protein, especially lean meats, poultry, eggs, tofu, beans, or nuts. ?Include low-fat dairy as part of your daily diet. ?Lifestyle ?Choose healthy options in all settings, including home, work, school, restaurants, or stores. ?Prepare your food safely: ?Wash your hands after handling raw meats. ?Keep food preparation surfaces clean by regularly washing with hot, soapy water. ?Keep raw meats separate from ready-to-eat foods, such as fruits and vegetables. ?Cook seafood, meat, poultry, and eggs to the recommended internal temperature. ?Store foods at safe temperatures. In general: ?Keep cold foods at 40?F (4.4?C) or below. ?Keep hot foods at 140?F (60?C) or above. ?Keep your freezer at 0?F (-17.8?C) or below. ?Foods are no longer safe to eat when they have been between the temperatures of 40?-140?F (4.4-60?C) for more than 2 hours. ?What foods should I eat? ?Fruits ?Aim to eat 2 cup-equivalents of fresh, canned (in natural juice), or frozen fruits each day. Examples of 1 cup-equivalent of fruit include 1 small apple, 8 large strawberries, 1 cup canned fruit, ? cup dried fruit, or 1 cup 100% juice. ?Vegetables ?Aim to eat 2?-3 cup-equivalents of fresh and frozen vegetables each day, including different varieties and colors. Examples of 1 cup-equivalent of vegetables include 2 medium carrots, 2 cups raw,  leafy greens, 1 cup chopped vegetable (raw or cooked), or 1 medium baked potato. ?Grains ?Aim to  eat 6 ounce-equivalents of whole grains each day. Examples of 1 ounce-equivalent of grains include 1 slice of bread, 1 cup ready-to-eat cereal, 3 cups popcorn, or ? cup cooked rice, pasta, or cereal. ?Meats and other proteins ?Aim to eat 5-6 ounce-equivalents of protein each day. Examples of 1 ounce-equivalent of protein include 1 egg, 1/2 cup nuts or seeds, or 1 tablespoon (16 g) peanut butter. A cut of meat or fish that is the size of a deck of cards is about 3-4 ounce-equivalents. ?Of the protein you eat each week, try to have at least 8 ounces come from seafood. This includes salmon, trout, herring, and anchovies. ?Dairy ?Aim to eat 3 cup-equivalents of fat-free or low-fat dairy each day. Examples of 1 cup-equivalent of dairy include 1 cup (240 mL) milk, 8 ounces (250 g) yogurt, 1? ounces (44 g) natural cheese, or 1 cup (240 mL) fortified soy milk. ?Fats and oils ?Aim for about 5 teaspoons (21 g) per day. Choose monounsaturated fats, such as canola and olive oils, avocados, peanut butter, and most nuts, or polyunsaturated fats, such as sunflower, corn, and soybean oils, walnuts, pine nuts, sesame seeds, sunflower seeds, and flaxseed. ?Beverages ?Aim for six 8-oz glasses of water per day. Limit coffee to three to five 8-oz cups per day. ?Limit caffeinated beverages that have added calories, such as soda and energy drinks. ?Limit alcohol intake to no more than 1 drink a day for nonpregnant women and 2 drinks a day for men. One drink equals 12 oz of beer (355 mL), 5 oz of wine (148 mL), or 1? oz of hard liquor (44 mL). ?Seasoning and other foods ?Avoid adding excess amounts of salt to your foods. Try flavoring foods with herbs and spices instead of salt. ?Avoid adding sugar to foods. ?Try using oil-based dressings, sauces, and spreads instead of solid fats. ?This information is based on general [place] nutrition guidelines. For more information, visit choosemyplate.gov. Exact amounts may vary based on your nutrition  needs. ?Summary ?A healthy eating plan may help you to maintain a healthy weight, reduce the risk of chronic diseases, and stay active throughout your life. ?Plan your meals. Make sure you eat the right portions of a variety of nutrient-rich foods. ?Try baking, boiling, grilling, or broiling instead of frying. ?Choose healthy options in all settings, including home, work, school, restaurants, or stores. ?This information is not intended to replace advice given to you by your health care provider. Make sure you discuss any questions you have with your health care provider. ?Document Revised: 04/02/2021 Document Reviewed: 04/02/2021 ?Elsevier Patient Education ? 2023 Elsevier Inc. ? ?

## 2021-12-24 NOTE — Progress Notes (Signed)
Follow up HTN 

## 2021-12-24 NOTE — Progress Notes (Signed)
? ? ?Patient ID: Caitlin [name], female    DOB: 06-Sep-1965  MRN: [id number] ? ?CC: No chief complaint on file. ? ? ?Subjective: ?Caitlin [name] is a 56 y.o. female who presents for f/u HTN.  Cleda Clarks, with [facility] is doing sign lang ?Her concerns today include:  ?Pt with hx of HTN, GERD, obesity, asthma ? ?Obesity: up 4 lbs since last visit, was eating more with family ?Walks every night for 30 mins after dinner. ?Reports she is over eating.  Cooks a lot for family.  Trying to eat healthier.  Baking meats ?Moving to [place] next mth due to lower housing cost.  ? ?HTN:  compliant with meds that include carvedilol, Diovan/HCTZ and amlodipine. ?Not limiting salt as much when she eats family cooked food.  No chest pains or shortness of breath. ? ?HM:  due for Shingrix.  Does not want  ?Patient Active Problem List  ? Diagnosis Date Noted  ? Viral URI with cough 09/28/2015  ? Obesity (BMI 30.0-34.9) 11/20/2014  ? Menopausal symptoms 07/27/2014  ? Hair loss 05/01/2014  ? Onychomycosis 05/01/2014  ? HTN (hypertension) 03/06/2012  ? GERD (gastroesophageal reflux disease) 03/06/2012  ?  ? ?Current Outpatient Medications on File Prior to Visit  ?Medication Sig Dispense Refill  ? albuterol (VENTOLIN HFA) 108 (90 Base) MCG/ACT inhaler Inhale 2 puffs into the lungs every 6 (six) hours as needed for wheezing or shortness of breath. 18 g 1  ? amLODipine (NORVASC) 10 MG tablet Take 1 tablet (10 mg total) by mouth daily. To lower blood pressure 90 tablet 1  ? atorvastatin (LIPITOR) 10 MG tablet Take 1 tablet (10 mg total) by mouth daily. 30 tablet 2  ? Blood Pressure Monitor KIT Use to check blood pressure daily 1 kit 0  ? budesonide-formoterol (SYMBICORT) 80-4.5 MCG/ACT inhaler Inhale 2 puffs into the lungs in the morning and at bedtime. 1 each 5  ? carvedilol (COREG) 3.125 MG tablet Take 1 tablet (3.125 mg total) by mouth 2 (two) times daily with a meal. 60 tablet 3  ? levocetirizine (XYZAL) 5 MG tablet Take 1 tablet (5  mg total) by mouth every evening. 30 tablet 5  ? Misc. Devices MISC CPAP therapy on 17 cm H2O . Patient to use X-Small size Resmed Full Face Mask AirFit F10. Diagnosis - Obstructive sleep apnea 1 each 0  ? Olopatadine HCl 0.2 % SOLN Apply 1 drop to eye daily as needed (itchy/watery eyes). 2.5 mL 5  ? triamcinolone (NASACORT) 55 MCG/ACT AERO nasal inhaler Place 2 sprays into the nose daily. 16.5 g 5  ? valsartan-hydrochlorothiazide (DIOVAN-HCT) 320-25 MG tablet Take 1 tablet by mouth once daily 90 tablet 0  ? Vitamin D, Ergocalciferol, (DRISDOL) 1.25 MG (50000 UNIT) CAPS capsule Take 1 capsule (50,000 Units total) by mouth every 7 (seven) days. 16 capsule 0  ? nystatin (MYCOSTATIN/NYSTOP) powder Apply 1 application topically 2 (two) times daily. Apply to affected area under breast (Patient not taking: Reported on 12/24/2021) 15 g 0  ? traZODone (DESYREL) 50 MG tablet Take 0.5-1 tablets (25-50 mg total) by mouth at bedtime as needed for sleep. (Patient not taking: Reported on 12/24/2021) 30 tablet 3  ? ?No current facility-administered medications on file prior to visit.  ? ? ?Allergies  ?Allergen Reactions  ? Eggs Or Egg-Derived Products Nausea And Vomiting  ? Milk-Related Compounds   ? ? ?Social History  ? ?Socioeconomic History  ? Marital status: Married  ?  Spouse name: Not on  file  ? Number of children: 3   ? Years of education: Not on file  ? Highest education level: Not on file  ?Occupational History  ?  Employer: UNEMPLOYED  ?Tobacco Use  ? Smoking status: Never  ? Smokeless tobacco: Never  ?Vaping Use  ? Vaping Use: Never used  ?Substance and Sexual Activity  ? Alcohol use: Yes  ?  Comment: occassionally   ? Drug use: No  ? Sexual activity: Yes  ?  Birth control/protection: Surgical  ?Other Topics Concern  ? Not on file  ?Social History Narrative  ? Not on file  ? ?Social Determinants of Health  ? ?Financial Resource Strain: Not on file  ?Food Insecurity: Not on file  ?Transportation Needs: Not on file  ?Physical  Activity: Not on file  ?Stress: Not on file  ?Social Connections: Not on file  ?Intimate Partner Violence: Not on file  ? ? ?Family History  ?Problem Relation Age of Onset  ? Heart disease Mother   ? Stroke Mother   ? ? ?Past Surgical History:  ?Procedure Laterality Date  ? CESAREAN SECTION    ? CHOLECYSTECTOMY    ? TUBAL LIGATION    ? ? ?ROS: ?Review of Systems ?Negative except as stated above ? ?PHYSICAL EXAM: ?BP 118/77 (BP Location: Left Arm, Patient Position: Sitting, Cuff Size: Large)   Pulse 65   Temp 98.2 ?F (36.8 ?C)   Resp 16   Ht 5' 3" (1.6 m)   Wt 188 lb (85.3 kg)   LMP 05/08/2016   SpO2 98%   BMI 33.30 kg/m?   ?Wt Readings from Last 3 Encounters:  ?12/24/21 188 lb (85.3 kg)  ?12/12/21 187 lb 8 oz (85 kg)  ?09/26/21 184 lb 9.6 oz (83.7 kg)  ? ? ?Physical Exam ? ?General appearance - alert, well appearing, and in no distress ?Mental status - normal mood, behavior, speech, dress, motor activity, and thought processes ?Neck - supple, no significant adenopathy ?Chest - clear to auscultation, no wheezes, rales or rhonchi, symmetric air entry ?Heart - normal rate, regular rhythm, normal S1, S2, no murmurs, rubs, clicks or gallops ?Extremities - peripheral pulses normal, no pedal edema, no clubbing or cyanosis ? ? ? ?  Latest Ref Rng & Units 05/12/2021  ?  6:49 PM 04/25/2021  ? 11:28 AM 01/05/2020  ?  9:24 AM  ?CMP  ?Glucose 70 - 99 mg/dL 126   100   100    ?BUN 6 - 20 mg/dL _0 ?Creatinine 0.44 - 1.00 mg/dL 0.87   0.87   0.92    ?Sodium 135 - 145 mmol/L 137   141   141    ?Potassium 3.5 - 5.1 mmol/L 3.4   3.9   4.5    ?Chloride 98 - 111 mmol/L 101   101   98    ?CO2 22 - 32 mmol/L _1 ?Calcium 8.9 - 10.3 mg/dL 9.4   9.9   9.9    ?Total Protein 6.5 - 8.1 g/dL 7.8   7.8     ?Total Bilirubin 0.3 - 1.2 mg/dL 0.8   0.6     ?Alkaline Phos 38 - 126 U/L 71   94     ?AST 15 - 41 U/L 25   22     ?ALT 0 - 44 U/L 25   26     ? ?Lipid Panel  ?   ?  Component Value Date/Time  ? CHOL 211 (H)  04/25/2021 1128  ? TRIG 122 04/25/2021 1128  ? HDL 38 (L) 04/25/2021 1128  ? CHOLHDL 5.6 (H) 04/25/2021 1128  ? CHOLHDL 4.0 03/06/2012 0542  ? VLDL 7 03/06/2012 0542  ? LDLCALC 151 (H) 04/25/2021 1128  ? ? ?CBC ?   ?Component Value Date/Time  ? WBC 5.4 05/12/2021 1849  ? RBC 4.37 05/12/2021 1849  ? HGB 12.9 05/12/2021 1849  ? HGB 12.8 04/25/2021 1128  ? HCT 39.1 05/12/2021 1849  ? HCT 38.8 04/25/2021 1128  ? PLT 395 05/12/2021 1849  ? PLT 385 04/25/2021 1128  ? MCV 89.5 05/12/2021 1849  ? MCV 88 04/25/2021 1128  ? MCV 90 05/28/2012 1937  ? MCH 29.5 05/12/2021 1849  ? MCHC 33.0 05/12/2021 1849  ? RDW 12.2 05/12/2021 1849  ? RDW 12.4 04/25/2021 1128  ? RDW 13.1 05/28/2012 1937  ? LYMPHSABS 1.9 05/12/2021 1849  ? LYMPHSABS 2.0 06/16/2019 1012  ? MONOABS 0.4 05/12/2021 1849  ? EOSABS 0.1 05/12/2021 1849  ? EOSABS 0.1 06/16/2019 1012  ? BASOSABS 0.0 05/12/2021 1849  ? BASOSABS 0.0 06/16/2019 1012  ? ? ?ASSESSMENT AND PLAN: ?1. Essential hypertension ?At goal.  Continue carvedilol 3.125 mg twice a day, Norvasc 10 mg daily and Diovan/HCTZ 320/25 mg daily.  DASH diet discussed and encouraged. ?- Basic Metabolic Panel ? ?2. Obesity (BMI 30.0-34.9) ?Patient advised to eliminate sugary drinks from the diet, cut back on portion sizes especially of white carbohydrates, eat more white lean meat like chicken [place] and seafood instead of beef or pork and incorporate fresh fruits and vegetables into the diet daily. ?Commended her on regular exercise.  Encouraged her to keep up the good work. ? ? ? ?Patient was given the opportunity to ask questions.  Patient verbalized understanding of the plan and was able to repeat key elements of the plan.  ? ?This documentation was completed using Radio producer.  Any transcriptional errors are unintentional. ? ?Orders Placed This Encounter  ?Procedures  ? Basic Metabolic Panel  ? ? ? ?Requested Prescriptions  ? ? No prescriptions requested or ordered in this encounter   ? ? ?Return in about 4 months (around 04/26/2022). ? ?Karle Plumber, MD, FACP ? ?

## 2021-12-25 LAB — BASIC METABOLIC PANEL
BUN/Creatinine Ratio: 12 (ref 9–23)
BUN: 10 mg/dL (ref 6–24)
CO2: 26 mmol/L (ref 20–29)
Calcium: 9.4 mg/dL (ref 8.7–10.2)
Chloride: 99 mmol/L (ref 96–106)
Creatinine, Ser: 0.84 mg/dL (ref 0.57–1.00)
Glucose: 98 mg/dL (ref 70–99)
Potassium: 4.1 mmol/L (ref 3.5–5.2)
Sodium: 139 mmol/L (ref 134–144)
eGFR: 82 mL/min/{1.73_m2} (ref >59)

## 2022-01-02 ENCOUNTER — Other Ambulatory Visit: Payer: Self-pay | Admitting: Internal Medicine

## 2022-01-02 DIAGNOSIS — I10 Essential (primary) hypertension: Secondary | ICD-10-CM

## 2022-01-02 NOTE — Telephone Encounter (Signed)
Requested Prescriptions  Pending Prescriptions Disp Refills  . amLODipine (NORVASC) 10 MG tablet [Pharmacy Med Name: amLODIPine Besylate 10 MG Oral Tablet] 90 tablet 0    Sig: TAKE 1 TABLET BY MOUTH ONCE DAILY. TO LOWER BLOOD PRESSURE     Cardiovascular: Calcium Channel Blockers 2 Passed - 01/02/2022 11:21 AM      Passed - Last BP in normal range    BP Readings from Last 1 Encounters:  12/24/21 118/77         Passed - Last Heart Rate in normal range    Pulse Readings from Last 1 Encounters:  12/24/21 65         Passed - Valid encounter within last 6 months    Recent Outpatient Visits          1 week ago Essential hypertension   [place], MD   3 months ago Essential hypertension   [place], MD   4 months ago Primary hypertension   [place], Stephen L, RPH-CPP   6 months ago Essential hypertension   [place], MD   7 months ago Accelerated hypertension   [place], [place], MD      Future Appointments            In 3 months Wynetta Emery, Dalbert Batman, MD [place]

## 2022-01-14 ENCOUNTER — Other Ambulatory Visit: Payer: Self-pay | Admitting: Family

## 2022-01-14 DIAGNOSIS — Z1231 Encounter for screening mammogram for malignant neoplasm of breast: Secondary | ICD-10-CM

## 2022-02-12 ENCOUNTER — Other Ambulatory Visit: Payer: Self-pay | Admitting: Internal Medicine

## 2022-02-12 DIAGNOSIS — I10 Essential (primary) hypertension: Secondary | ICD-10-CM

## 2022-02-12 MED ORDER — CARVEDILOL 3.125 MG PO TABS: 3.1250 mg | ORAL_TABLET | Freq: Two times a day (BID) | ORAL | 0 refills | Status: DC

## 2022-02-12 MED ORDER — VALSARTAN-HYDROCHLOROTHIAZIDE 320-25 MG PO TABS: 1.0000 | ORAL_TABLET | Freq: Every day | ORAL | 0 refills | Status: DC

## 2022-02-12 NOTE — Telephone Encounter (Signed)
Requested medication (s) are due for refill today: no  Requested medication (s) are on the active medication list: yes  Last refill:  coreg- 06/21/21 #60 3 refills, diovan- 12/09/21 #90 0 refills   Future visit scheduled: yes in  32month  Notes to clinic:  out of medications , should have enough to last until July. Do you want to refill Rxs?     Requested Prescriptions  Pending Prescriptions Disp Refills   carvedilol (COREG) 3.125 MG tablet 60 tablet 3    Sig: Take 1 tablet (3.125 mg total) by mouth 2 (two) times daily with a meal.     Cardiovascular: Beta Blockers 3 Passed - 02/12/2022  2:34 PM      Passed - Cr in normal range and within 360 days    Creat  Date Value Ref Range Status  09/28/2015 0.81 0.50 - 1.10 mg/dL Final   Creatinine, Ser  Date Value Ref Range Status  12/24/2021 0.84 0.57 - 1.00 mg/dL Final         Passed - AST in normal range and within 360 days    AST  Date Value Ref Range Status  05/12/2021 25 15 - 41 U/L Final   SGOT(AST)  Date Value Ref Range Status  05/28/2012 22 15 - 37 Unit/L Final         Passed - ALT in normal range and within 360 days    ALT  Date Value Ref Range Status  05/12/2021 25 0 - 44 U/L Final   SGPT (ALT)  Date Value Ref Range Status  05/28/2012 22 12 - 78 U/L Final         Passed - Last BP in normal range    BP Readings from Last 1 Encounters:  12/24/21 118/77         Passed - Last Heart Rate in normal range    Pulse Readings from Last 1 Encounters:  12/24/21 65         Passed - Valid encounter within last 6 months    Recent Outpatient Visits           1 month ago Essential hypertension   [place] [facility] [place]   4 months ago Essential hypertension   [facility] [place]   5 months ago Primary hypertension   [place] Stephen L, RPH-CPP   7 months ago Essential hypertension   [place] MD   8 months ago Accelerated hypertension   [place] En[place], MD       Future Appointments             In 2 months J[place]adell Pier MD [place]            valsartan-hydrochlorothiazide (DIOVAN-HCT) 320-25 MG tablet 90 tablet 0    Sig: Take 1 tablet by mouth daily.     Cardiovascular: ARB + Diuretic Combos Passed - 02/12/2022  2:34 PM      Passed - K in normal range and within 180 days    Potassium  Date Value Ref Range Status  12/24/2021 4.1 3.5 - 5.2 mmol/L Final  05/28/2012 3.1 (L) 3.5 - 5.1 mmol/L Final         Passed - Na in normal range and within 180 days    Sodium  Date Value Ref Range Status  12/24/2021 139 134 - 144 mmol/L Final  05/28/2012 140 136 - 145 mmol/L Final         Passed - Cr in normal range and within 180 days    Creat  Date Value Ref Range Status  09/28/2015 0.81 0.50 - 1.10 mg/dL Final   Creatinine, Ser  Date Value Ref Range Status  12/24/2021 0.84 0.57 - 1.00 mg/dL Final         Passed - eGFR is 10 or above and within 180 days    GFR, Est African American  Date Value Ref Range Status  09/28/2015 >89 >=60 mL/min Final   GFR calc Af Amer  Date Value Ref Range Status  01/05/2020 82 >59 mL/min/1.73 Final    Comment:    **[facility] currently reports eGFR in compliance with the current**   recommendations of the [employer]. [facility] will   update reporting as new guidelines are published from the NKF-ASN   Task force.    GFR, Est Non African American  Date Value Ref Range Status  09/28/2015 86 >=60 mL/min Final    Comment:      The estimated GFR is a calculation valid for adults (>=110 years old) that uses the CKD-EPI algorithm to adjust for age and sex. It is   not to be used for children, pregnant women, hospitalized patients,    patients on dialysis, or with rapidly changing kidney  function. According to the NKDEP, eGFR >89 is normal, 60-89 shows mild impairment, 30-59 shows moderate impairment, 15-29 shows severe impairment and <15 is ESRD.      GFR, Estimated  Date Value Ref Range Status  05/12/2021 >60 >60 mL/min Final    Comment:    (NOTE) Calculated using the CKD-EPI Creatinine Equation (2021)    eGFR  Date Value Ref Range Status  12/24/2021 82 >59 mL/min/1.73 Final         Passed - Patient is not pregnant      Passed - Last BP in normal range    BP Readings from Last 1 Encounters:  12/24/21 118/77         Passed - Valid encounter within last 6 months    Recent Outpatient Visits           1 month ago Essential hypertension   [place], Deborah B, MD   4 months ago Essential hypertension   [place], MD   5 months ago Primary hypertension   [place], Jarome Matin, RPH-CPP   7 months ago Essential hypertension   [place], MD   8 months ago Accelerated hypertension   [place], Enobong, MD       Future Appointments             In 2 months Wynetta Emery, Dalbert Batman, MD [place]

## 2022-02-12 NOTE — Telephone Encounter (Signed)
Medication Refill - Medication: carvedilol (COREG) 3.125 MG tablet,valsartan-hydrochlorothiazide (DIOVAN-HCT) 320-25 MG tablet  Pt is out of her medication has been out for two days.   Has the patient contacted their pharmacy? Yes.   Pt stated pharmacy was supposed to request refills.   (Agent: If yes, when and what did the pharmacy advise?)  Preferred Pharmacy (with phone number or street name):  [facility] [address]), [place] - [address]  [address]) [place] [address]  Phone: [phone] Fax: [phone]  Hours: Not open 24 hours   Has the patient been seen for an appointment in the last year OR does the patient have an upcoming appointment? Yes.    Agent: Please be advised that RX refills may take up to 3 business days. We ask that you follow-up with your pharmacy.

## 2022-02-13 ENCOUNTER — Ambulatory Visit: Payer: Medicaid Other

## 2022-03-03 ENCOUNTER — Telehealth: Payer: Self-pay | Admitting: Emergency Medicine

## 2022-03-03 NOTE — Telephone Encounter (Signed)
Copied from CRM [id number]. Topic: Referral - Status >> Mar 03, 2022  3:54 PM Macon Large wrote: Reason for CRM: Pt called in with sign language interpreter and requested referral to a dentist in [facility]. Cb# [phone]

## 2022-03-04 ENCOUNTER — Ambulatory Visit: Payer: Medicaid Other

## 2022-04-01 ENCOUNTER — Ambulatory Visit: Payer: Medicaid Other

## 2022-04-24 ENCOUNTER — Ambulatory Visit: Payer: Medicaid Other | Admitting: Allergy

## 2022-04-29 ENCOUNTER — Ambulatory Visit: Payer: Medicaid Other | Attending: Internal Medicine | Admitting: Internal Medicine

## 2022-04-29 ENCOUNTER — Ambulatory Visit
Admission: RE | Admit: 2022-04-29 | Discharge: 2022-04-29 | Disposition: A | Discharge status: Home / Self Care | Payer: Medicaid Other | Source: Ambulatory Visit | Attending: Family | Admitting: Family

## 2022-04-29 ENCOUNTER — Encounter: Payer: Self-pay | Admitting: Internal Medicine

## 2022-04-29 VITALS — BP 130/82 | HR 69 | Temp 99.0°F | Ht 64.0 in | Wt 188.4 lb

## 2022-04-29 DIAGNOSIS — Z1231 Encounter for screening mammogram for malignant neoplasm of breast: Secondary | ICD-10-CM

## 2022-04-29 DIAGNOSIS — E669 Obesity, unspecified: Secondary | ICD-10-CM | POA: Diagnosis not present

## 2022-04-29 DIAGNOSIS — E782 Mixed hyperlipidemia: Secondary | ICD-10-CM | POA: Diagnosis not present

## 2022-04-29 DIAGNOSIS — I10 Essential (primary) hypertension: Secondary | ICD-10-CM | POA: Diagnosis not present

## 2022-04-29 DIAGNOSIS — J069 Acute upper respiratory infection, unspecified: Secondary | ICD-10-CM | POA: Diagnosis not present

## 2022-04-29 MED ORDER — BENZONATATE 100 MG PO CAPS
100.0000 mg | ORAL_CAPSULE | Freq: Two times a day (BID) | ORAL | 0 refills | Status: DC | PRN
Start: 2022-04-29 — End: 2023-04-22

## 2022-04-29 NOTE — Patient Instructions (Signed)
Please get your COVID-19 booster vaccine once you get over this upper respiratory infection. Please get your flu vaccine once you get over the current upper respiratory infection. Please have the pharmacy send me the information once you get the vaccines.

## 2022-04-29 NOTE — Progress Notes (Signed)
Pt states she had cough w/ sore throat   Pt states sx's began 1wk ago   Pt states she still having sx's

## 2022-04-29 NOTE — Progress Notes (Signed)
Patient ID: Caitlin [name], female    DOB: 12/12/1965  MRN: [id number]  CC: Hypertension   Subjective: Caitlin [name] is a 56 y.o. female who presents for chronic ds management.  Henrene Hawking from [facility] is with her and provide sign language services. Her concerns today include:  Pt with hx of HTN, GERD, obesity, asthma  Patient complains of having cough and sore throat x1 week.  The cough is worse at night.  She feels tired.  No chest or nasal congestion.  No shortness of breath.  Her oldest daughter was sick last week but tested negative for COVID.  Patient has not done a home COVID test.  She has had 2 COVID Moderna vaccines.  Did not have a booster.  She has not had a flu shot as yet for this season. She reports no major flares in asthma since last visit.  She continues to take the Symbicort inhaler twice a day.  HTN: Reports compliance with taking medications including amlodipine 10 mg daily, carvedilol 3.125 mg twice a day, Diovan/HCTZ 320/25 mg daily.  She is not limiting salt as much as she should.  No lower extremity edema.  HL: Taking and tolerating atorvastatin 10 mg daily.  Obesity: Weight is stable at 188 pounds since last visit.  She is eating more fruits and vegetables and drinking more water.  She tries to bake her meats.  She is walking daily for 30 minutes.  Needs to have some dental work done but having difficulty finding a dentist who takes Medicaid.  Patient Active Problem List   Diagnosis Date Noted   Viral URI with cough 09/28/2015   Obesity (BMI 30.0-34.9) 11/20/2014   Menopausal symptoms 07/27/2014   Hair loss 05/01/2014   Onychomycosis 05/01/2014   HTN (hypertension) 03/06/2012   GERD (gastroesophageal reflux disease) 03/06/2012     Current Outpatient Medications on File Prior to Visit  Medication Sig Dispense Refill   albuterol (VENTOLIN HFA) 108 (90 Base) MCG/ACT inhaler Inhale 2 puffs into the lungs every 6 (six) hours as needed for wheezing or  shortness of breath. 18 g 1   amLODipine (NORVASC) 10 MG tablet TAKE 1 TABLET BY MOUTH ONCE DAILY. TO LOWER BLOOD PRESSURE 90 tablet 1   atorvastatin (LIPITOR) 10 MG tablet Take 1 tablet (10 mg total) by mouth daily. 30 tablet 2   Blood Pressure Monitor KIT Use to check blood pressure daily 1 kit 0   budesonide-formoterol (SYMBICORT) 80-4.5 MCG/ACT inhaler Inhale 2 puffs into the lungs in the morning and at bedtime. 1 each 5   carvedilol (COREG) 3.125 MG tablet Take 1 tablet (3.125 mg total) by mouth 2 (two) times daily with a meal. 180 tablet 0   levocetirizine (XYZAL) 5 MG tablet Take 1 tablet (5 mg total) by mouth every evening. 30 tablet 5   Misc. Devices MISC CPAP therapy on 17 cm H2O . Patient to use X-Small size Resmed Full Face Mask AirFit F10. Diagnosis - Obstructive sleep apnea 1 each 0   nystatin (MYCOSTATIN/NYSTOP) powder Apply 1 application topically 2 (two) times daily. Apply to affected area under breast 15 g 0   Olopatadine HCl 0.2 % SOLN Apply 1 drop to eye daily as needed (itchy/watery eyes). 2.5 mL 5   traZODone (DESYREL) 50 MG tablet Take 0.5-1 tablets (25-50 mg total) by mouth at bedtime as needed for sleep. 30 tablet 3   triamcinolone (NASACORT) 55 MCG/ACT AERO nasal inhaler Place 2 sprays into the nose daily. 16.5  g 5   valsartan-hydrochlorothiazide (DIOVAN-HCT) 320-25 MG tablet Take 1 tablet by mouth daily. 90 tablet 0   Vitamin D, Ergocalciferol, (DRISDOL) 1.25 MG (50000 UNIT) CAPS capsule Take 1 capsule (50,000 Units total) by mouth every 7 (seven) days. 16 capsule 0   No current facility-administered medications on file prior to visit.    Allergies  Allergen Reactions   Eggs Or Egg-Derived Products Nausea And Vomiting   Milk-Related Compounds     Social History   Socioeconomic History   Marital status: Married    Spouse name: Not on file   Number of children: 3    Years of education: Not on file   Highest education level: Not on file  Occupational History     Employer: UNEMPLOYED  Tobacco Use   Smoking status: Never   Smokeless tobacco: Never  Vaping Use   Vaping Use: Never used  Substance and Sexual Activity   Alcohol use: Yes    Comment: occassionally    Drug use: No   Sexual activity: Yes    Birth control/protection: Surgical  Other Topics Concern   Not on file  Social History Narrative   Not on file   Social Determinants of Health   Financial Resource Strain: Not on file  Food Insecurity: Not on file  Transportation Needs: Not on file  Physical Activity: Not on file  Stress: Not on file  Social Connections: Not on file  Intimate Partner Violence: Not on file    Family History  Problem Relation Age of Onset   Heart disease Mother    Stroke Mother    Breast cancer Neg Hx     Past Surgical History:  Procedure Laterality Date   CESAREAN SECTION     CHOLECYSTECTOMY     TUBAL LIGATION      ROS: Review of Systems Negative except as stated above  PHYSICAL EXAM: BP 130/82   Pulse 69   Temp 99 F (37.2 C) (Oral)   Ht 5' 4" (1.626 m)   Wt 188 lb 6.4 oz (85.5 kg)   LMP 05/08/2016   SpO2 98%   BMI 32.34 kg/m   Wt Readings from Last 3 Encounters:  04/29/22 188 lb 6.4 oz (85.5 kg)  12/24/21 188 lb (85.3 kg)  12/12/21 187 lb 8 oz (85 kg)    Physical Exam  General appearance - alert, well appearing, and in no distress Mental status - normal mood, behavior, speech, dress, motor activity, and thought processes Nose - normal and patent, no erythema, discharge or polyps Mouth -throat is without erythema or exudate. Neck - supple, no significant adenopathy Chest - clear to auscultation, no wheezes, rales or rhonchi, symmetric air entry Heart - normal rate, regular rhythm, normal S1, S2, no murmurs, rubs, clicks or gallops Extremities - peripheral pulses normal, no pedal edema, no clubbing or cyanosis      Latest Ref Rng & Units 12/24/2021   12:00 PM 05/12/2021    6:49 PM 04/25/2021   11:28 AM  CMP  Glucose 70 -  99 mg/dL 98  126  100   BUN 6 - 24 mg/dL _0 Creatinine 0.57 - 1.00 mg/dL 0.84  0.87  0.87   Sodium 134 - 144 mmol/L 139  137  141   Potassium 3.5 - 5.2 mmol/L 4.1  3.4  3.9   Chloride 96 - 106 mmol/L 99  101  101   CO2 20 - 29 mmol/L 26  27  26   Calcium 8.7 - 10.2 mg/dL 9.4  9.4  9.9   Total Protein 6.5 - 8.1 g/dL  7.8  7.8   Total Bilirubin 0.3 - 1.2 mg/dL  0.8  0.6   Alkaline Phos 38 - 126 U/L  71  94   AST 15 - 41 U/L  25  22   ALT 0 - 44 U/L  25  26    Lipid Panel     Component Value Date/Time   CHOL 211 (H) 04/25/2021 1128   TRIG 122 04/25/2021 1128   HDL 38 (L) 04/25/2021 1128   CHOLHDL 5.6 (H) 04/25/2021 1128   CHOLHDL 4.0 03/06/2012 0542   VLDL 7 03/06/2012 0542   LDLCALC 151 (H) 04/25/2021 1128    CBC    Component Value Date/Time   WBC 5.4 05/12/2021 1849   RBC 4.37 05/12/2021 1849   HGB 12.9 05/12/2021 1849   HGB 12.8 04/25/2021 1128   HCT 39.1 05/12/2021 1849   HCT 38.8 04/25/2021 1128   PLT 395 05/12/2021 1849   PLT 385 04/25/2021 1128   MCV 89.5 05/12/2021 1849   MCV 88 04/25/2021 1128   MCV 90 05/28/2012 1937   MCH 29.5 05/12/2021 1849   MCHC 33.0 05/12/2021 1849   RDW 12.2 05/12/2021 1849   RDW 12.4 04/25/2021 1128   RDW 13.1 05/28/2012 1937   LYMPHSABS 1.9 05/12/2021 1849   LYMPHSABS 2.0 06/16/2019 1012   MONOABS 0.4 05/12/2021 1849   EOSABS 0.1 05/12/2021 1849   EOSABS 0.1 06/16/2019 1012   BASOSABS 0.0 05/12/2021 1849   BASOSABS 0.0 06/16/2019 1012    ASSESSMENT AND PLAN: 1. Upper respiratory infection, acute Most likely common cold.  However I recommend COVID testing.  Our current batch of COVID test are outdated so we are unable to do 1 in the office today.  Advised patient to purchase 1 over-the-counter.  If positive please let me know so that we can place her on Paxlovid.  Otherwise I recommended conservative measures.  Prescription given for Tessalon Perles to use as needed for the cough.  She can use lozenges or gargle with  warm water and salt for the sore throat. - benzonatate (TESSALON) 100 MG capsule; Take 1 capsule (100 mg total) by mouth 2 (two) times daily as needed for cough.  Dispense: 20 capsule; Refill: 0  2. Essential hypertension Close to goal.  Continue Diovan HCTZ, Norvasc and carvedilol. - CBC  3. Obesity (BMI 30.0-34.9) Commended her on trying to make positive changes in her eating habits.  Encouraged to keep up the good works.  Encouraged her to continue to walk daily.  4. Mixed hyperlipidemia Continue atorvastatin. - Lipid panel - Hepatic function panel     Patient was given the opportunity to ask questions.  Patient verbalized understanding of the plan and was able to repeat key elements of the plan.   This documentation was completed using Radio producer.  Any transcriptional errors are unintentional.  Orders Placed This Encounter  Procedures   Lipid panel   CBC   Hepatic function panel     Requested Prescriptions   Signed Prescriptions Disp Refills   benzonatate (TESSALON) 100 MG capsule 20 capsule 0    Sig: Take 1 capsule (100 mg total) by mouth 2 (two) times daily as needed for cough.    Return in about 4 months (around 08/29/2022).  Karle Plumber, MD, FACP

## 2022-04-30 LAB — LIPID PANEL
Chol/HDL Ratio: 5.3 ratio — ABNORMAL HIGH (ref 0.0–4.4)
Cholesterol, Total: 201 mg/dL — ABNORMAL HIGH (ref 100–199)
HDL: 38 mg/dL — ABNORMAL LOW (ref >39)
LDL Chol Calc (NIH): 134 mg/dL — ABNORMAL HIGH (ref 0–99)
Triglycerides: 161 mg/dL — ABNORMAL HIGH (ref 0–149)
VLDL Cholesterol Cal: 29 mg/dL (ref 5–40)

## 2022-04-30 LAB — CBC
Hematocrit: 39.1 % (ref 34.0–46.6)
Hemoglobin: 13 g/dL (ref 11.1–15.9)
MCH: 29.5 pg (ref 26.6–33.0)
MCHC: 33.2 g/dL (ref 31.5–35.7)
MCV: 89 fL (ref 79–97)
Platelets: 442 10*3/uL (ref 150–450)
RBC: 4.4 x10E6/uL (ref 3.77–5.28)
RDW: 12 % (ref 11.7–15.4)
WBC: 6.6 10*3/uL (ref 3.4–10.8)

## 2022-04-30 LAB — HEPATIC FUNCTION PANEL
ALT: 18 IU/L (ref 0–32)
AST: 13 IU/L (ref 0–40)
Albumin: 4.7 g/dL (ref 3.8–4.9)
Alkaline Phosphatase: 85 IU/L (ref 44–121)
Bilirubin Total: 0.4 mg/dL (ref 0.0–1.2)
Bilirubin, Direct: <0.1 mg/dL (ref 0.00–0.40)
Total Protein: 8 g/dL (ref 6.0–8.5)

## 2022-05-05 ENCOUNTER — Ambulatory Visit: Payer: Medicaid Other | Admitting: Family

## 2022-05-12 ENCOUNTER — Ambulatory Visit: Payer: Medicaid Other | Admitting: Family

## 2022-05-30 ENCOUNTER — Ambulatory Visit: Payer: Self-pay

## 2022-05-30 NOTE — Telephone Encounter (Signed)
Chief Complaint: questions about cholesterol medication Symptoms: N/A Frequency: N/A Pertinent Negatives: Patient denies N/A Disposition: [] ED /[] Urgent Care (no appt availability in office) / [] Appointment(In office/virtual)/ []  [place] [facility]/ [] Home Care/ [] Refused Recommended Disposition /[] [place] [facility]/ [x]  Follow-up with PCP Additional Notes: Using [employer], patient called saying a nurse left a message that Dr. Wynetta Emery wanted her to start on cholesterol medication and she wants to know what to do about that. Advised per lab results on 04/29/22 that she read on 05/25/22, she says yes she read them. Advised that Dr. Wynetta Emery wants to increase the atorvastatin to 20 mg daily if not taking the 10 mg daily. She says she's never taken any cholesterol medication. Advised I will send this to Dr. Wynetta Emery and someone will call with her recommendation on atorvastatin and the dosage to take. She verbalized understanding.   Reason for Disposition  [1] Caller has NON-URGENT medicine question about med that PCP prescribed AND [2] triager unable to answer question  Answer Assessment - Initial Assessment Questions 1. NAME of MEDICINE: "What medicine(s) are you calling about?"     Cholesterol medicine 2. QUESTION: "What is your question?" (e.g., double dose of medicine, side effect)     N/A 3. PRESCRIBER: "Who prescribed the medicine?" Reason: if prescribed by specialist, call should be referred to that group.     N/A 4. SYMPTOMS: "Do you have any symptoms?" If Yes, ask: "What symptoms are you having?"  "How bad are the symptoms (e.g., mild, moderate, severe)     N/A 5. PREGNANCY:  "Is there any chance that you are pregnant?" "When was your last menstrual period?"     N/A  Protocols used: Medication Question Call-A-AH

## 2022-05-30 NOTE — Telephone Encounter (Signed)
Patient called, left VM to return the call to the office to discuss medication with a nurse.  Summary: Cholesterol Medication questions   Pt has questions about her cholesterol medication

## 2022-06-02 ENCOUNTER — Other Ambulatory Visit: Payer: Self-pay | Admitting: Internal Medicine

## 2022-06-02 DIAGNOSIS — I10 Essential (primary) hypertension: Secondary | ICD-10-CM

## 2022-06-02 MED ORDER — ATORVASTATIN CALCIUM 10 MG PO TABS: 10.0000 mg | ORAL_TABLET | Freq: Every day | ORAL | 2 refills | Status: DC

## 2022-06-02 MED ORDER — VALSARTAN-HYDROCHLOROTHIAZIDE 320-25 MG PO TABS: 1.0000 | ORAL_TABLET | Freq: Every day | ORAL | 1 refills | Status: DC

## 2022-06-02 NOTE — Telephone Encounter (Signed)
Should patient remain on 20MG  medication.

## 2022-06-05 NOTE — Telephone Encounter (Signed)
Vm was left informing patient that she should be taking 10mg  lipitor medication and refill was sent to pharmacy.

## 2022-07-17 ENCOUNTER — Other Ambulatory Visit: Payer: Self-pay | Admitting: Internal Medicine

## 2022-07-17 DIAGNOSIS — I10 Essential (primary) hypertension: Secondary | ICD-10-CM

## 2022-07-17 MED ORDER — AMLODIPINE BESYLATE 10 MG PO TABS: ORAL_TABLET | ORAL | 0 refills | Status: DC

## 2022-07-17 MED ORDER — CARVEDILOL 3.125 MG PO TABS: 3.1250 mg | ORAL_TABLET | Freq: Two times a day (BID) | ORAL | 0 refills | Status: DC

## 2022-07-17 NOTE — Telephone Encounter (Signed)
Copied from CRM [id number]. Topic: General - Other >> Jul 17, 2022  3:31 PM Everette C wrote: Reason for CRM: Medication Refill - Medication: amLODipine (NORVASC) 10 MG tablet [[id number]]  carvedilol (COREG) 3.125 MG tablet [[id number]]    Has the patient contacted their pharmacy? No. (Agent: If no, request that the patient contact the pharmacy for the refill. If patient does not wish to contact the pharmacy document the reason why and proceed with request.) (Agent: If yes, when and what did the pharmacy advise?)  Preferred Pharmacy (with phone number or street name): [facility] [address] ([address]), [place] - [address]) [place] [address] Phone: [phone] Fax: [phone] Hours: Not open 24 hours   Has the patient been seen for an appointment in the last year OR does the patient have an upcoming appointment? Yes.    Agent: Please be advised that RX refills may take up to 3 business days. We ask that you follow-up with your pharmacy.

## 2022-07-17 NOTE — Telephone Encounter (Signed)
Requested medication (s) are due for refill today: yes  Requested medication (s) are on the active medication list: yes  Last refill:  02/12/22 #180 0 refills  Future visit scheduled: yes in 2 weeks  Notes to clinic:  no refills remain. Do you want to refill Rx?     Requested Prescriptions  Pending Prescriptions Disp Refills   carvedilol (COREG) 3.125 MG tablet 180 tablet 0    Sig: Take 1 tablet (3.125 mg total) by mouth 2 (two) times daily with a meal.     Cardiovascular: Beta Blockers 3 Passed - 07/17/2022  5:25 PM      Passed - Cr in normal range and within 360 days    Creat  Date Value Ref Range Status  09/28/2015 0.81 0.50 - 1.10 mg/dL Final   Creatinine, Ser  Date Value Ref Range Status  12/24/2021 0.84 0.57 - 1.00 mg/dL Final         Passed - AST in normal range and within 360 days    AST  Date Value Ref Range Status  04/29/2022 13 0 - 40 IU/L Final   SGOT(AST)  Date Value Ref Range Status  05/28/2012 22 15 - 37 Unit/L Final         Passed - ALT in normal range and within 360 days    ALT  Date Value Ref Range Status  04/29/2022 18 0 - 32 IU/L Final   SGPT (ALT)  Date Value Ref Range Status  05/28/2012 22 12 - 78 U/L Final         Passed - Last BP in normal range    BP Readings from Last 1 Encounters:  04/29/22 130/82         Passed - Last Heart Rate in normal range    Pulse Readings from Last 1 Encounters:  04/29/22 69         Passed - Valid encounter within last 6 months    Recent Outpatient Visits           2 months ago Upper respiratory infection, acute   [facility] Marcine Matar, MD   6 months ago Essential hypertension   [facility] Marcine Matar, MD   9 months ago Essential hypertension   [facility] Marcine Matar, MD   10 months ago Primary hypertension   [facility] Drucilla Chalet, RPH-CPP    1 year ago Essential hypertension   [facility] Marcine Matar, MD       Future Appointments             In 2 weeks Marcine Matar, MD [facility]   In 1 month Marcine Matar, MD [facility]            Signed Prescriptions Disp Refills   amLODipine (NORVASC) 10 MG tablet 90 tablet 0    Sig: TAKE 1 TABLET BY MOUTH ONCE DAILY. TO LOWER BLOOD PRESSURE     Cardiovascular: Calcium Channel Blockers 2 Passed - 07/17/2022  5:25 PM      Passed - Last BP in normal range    BP Readings from Last 1 Encounters:  04/29/22 130/82         Passed - Last Heart Rate in normal range    Pulse Readings from Last 1 Encounters:  04/29/22  68         Passed - Valid encounter within last 6 months    Recent Outpatient Visits           2 months ago Upper respiratory infection, acute   [facility] Marcine Matar, MD   6 months ago Essential hypertension   [facility] Marcine Matar, MD   9 months ago Essential hypertension   [facility] Marcine Matar, MD   10 months ago Primary hypertension   [facility] Drucilla Chalet, RPH-CPP   1 year ago Essential hypertension   [facility] Marcine Matar, MD       Future Appointments             In 2 weeks Marcine Matar, MD [facility]   In 1 month Laural Benes, Binnie Rail, MD [facility]

## 2022-07-17 NOTE — Telephone Encounter (Signed)
Future visit in 2 weeks. Requested Prescriptions  Pending Prescriptions Disp Refills   amLODipine (NORVASC) 10 MG tablet 90 tablet 0    Sig: TAKE 1 TABLET BY MOUTH ONCE DAILY. TO LOWER BLOOD PRESSURE     Cardiovascular: Calcium Channel Blockers 2 Passed - 07/17/2022  5:25 PM      Passed - Last BP in normal range    BP Readings from Last 1 Encounters:  04/29/22 130/82         Passed - Last Heart Rate in normal range    Pulse Readings from Last 1 Encounters:  04/29/22 69         Passed - Valid encounter within last 6 months    Recent Outpatient Visits           2 months ago Upper respiratory infection, acute   [facility] [place], Gavin Pound B, MD   6 months ago Essential hypertension   [facility] Marcine Matar, MD   9 months ago Essential hypertension   [facility] Marcine Matar, MD   10 months ago Primary hypertension   [facility] Drucilla Chalet, RPH-CPP   1 year ago Essential hypertension   [facility] Marcine Matar, MD       Future Appointments             In 2 weeks Marcine Matar, MD [facility]   In 1 month Laural Benes, Binnie Rail, MD [facility]             carvedilol (COREG) 3.125 MG tablet 180 tablet 0    Sig: Take 1 tablet (3.125 mg total) by mouth 2 (two) times daily with a meal.     Cardiovascular: Beta Blockers 3 Passed - 07/17/2022  5:25 PM      Passed - Cr in normal range and within 360 days    Creat  Date Value Ref Range Status  09/28/2015 0.81 0.50 - 1.10 mg/dL Final   Creatinine, Ser  Date Value Ref Range Status  12/24/2021 0.84 0.57 - 1.00 mg/dL Final         Passed - AST in normal range and within 360 days    AST  Date Value Ref Range Status  04/29/2022 13 0 - 40 IU/L Final   SGOT(AST)  Date Value Ref  Range Status  05/28/2012 22 15 - 37 Unit/L Final         Passed - ALT in normal range and within 360 days    ALT  Date Value Ref Range Status  04/29/2022 18 0 - 32 IU/L Final   SGPT (ALT)  Date Value Ref Range Status  05/28/2012 22 12 - 78 U/L Final         Passed - Last BP in normal range    BP Readings from Last 1 Encounters:  04/29/22 130/82         Passed - Last Heart Rate in normal range    Pulse Readings from Last 1 Encounters:  04/29/22 69         Passed - Valid encounter within last 6 months    Recent Outpatient Visits           2 months ago Upper respiratory infection, acute   [facility] Marcine Matar, MD  6 months ago Essential hypertension   [facility] Marcine Matar, MD   9 months ago Essential hypertension   [facility] Marcine Matar, MD   10 months ago Primary hypertension   [facility] [place] Hux[place], Cornelius Moras, RPH-CPP   1 year ago Essential hypertension   [facility] Marcine Matar, MD       Future Appointments             In 2 weeks Marcine Matar, MD [facility]   In 1 month Laural Benes, Binnie Rail, MD [facility]

## 2022-08-01 ENCOUNTER — Ambulatory Visit: Payer: Medicaid Other | Admitting: Internal Medicine

## 2022-08-20 ENCOUNTER — Telehealth: Payer: Self-pay

## 2022-08-20 NOTE — Telephone Encounter (Signed)
I spoke to [employer] - regarding the 2 way speech-generating device.  She said that the patient contacted them after speaking with a representative at a conference for the deaf in [place]. They have verified that her insurance will cover some, if not all, of the cost of the device.  They will not know if it will cover 50%, 80%, 100% until all required documentation is submitted to the insurance company.  After the documentation is [employer] can take 30 days to approve/deny.  She explained that  the fax document that Dr Wynetta Emery received outlines what they need at this time.    They need a referral for SLP for AAC ( Augmentative and alternating communication device) evaluation.  She stated that not every SLP is trained for performing this type of evaluation.  The referral can be sent to [facility] and they will arrange for the evaluation.   The patient's office visit notes discussing the need for this device are also required.   She said the this device does not need WiFi.  It is actually 2 devices that can be up to 100 ft apart and two individuals can communicate with each other using these devices. She said they are like laptops and the patient and other person they are communicating by typing their conversations to each other. The examples they provided on the fax are related to use in the medical field but they can be used in the home.  The clinic can still use the video and in person interpreters, we do not have to use these devices.

## 2022-08-29 ENCOUNTER — Ambulatory Visit: Payer: Medicaid Other | Admitting: Internal Medicine

## 2022-09-04 ENCOUNTER — Ambulatory Visit: Payer: Medicaid Other | Admitting: Internal Medicine

## 2022-09-04 ENCOUNTER — Ambulatory Visit: Payer: Medicaid Other | Admitting: Allergy

## 2022-10-07 ENCOUNTER — Ambulatory Visit: Payer: Medicaid Other | Admitting: Internal Medicine

## 2022-11-06 ENCOUNTER — Other Ambulatory Visit: Payer: Self-pay | Admitting: Internal Medicine

## 2022-11-06 DIAGNOSIS — I10 Essential (primary) hypertension: Secondary | ICD-10-CM

## 2022-11-07 MED ORDER — AMLODIPINE BESYLATE 10 MG PO TABS: ORAL_TABLET | ORAL | 0 refills | Status: DC

## 2022-11-24 ENCOUNTER — Ambulatory Visit: Payer: Medicaid Other | Admitting: Nurse Practitioner

## 2022-12-18 ENCOUNTER — Other Ambulatory Visit: Payer: Self-pay | Admitting: Internal Medicine

## 2022-12-18 DIAGNOSIS — I10 Essential (primary) hypertension: Secondary | ICD-10-CM

## 2023-01-05 DIAGNOSIS — Z0489 Encounter for examination and observation for other specified reasons: Secondary | ICD-10-CM | POA: Diagnosis not present

## 2023-01-05 DIAGNOSIS — K0889 Other specified disorders of teeth and supporting structures: Secondary | ICD-10-CM | POA: Diagnosis not present

## 2023-01-11 ENCOUNTER — Other Ambulatory Visit: Payer: Self-pay

## 2023-01-11 ENCOUNTER — Emergency Department
Admission: EM | Admit: 2023-01-11 | Discharge: 2023-01-11 | Disposition: A | Discharge status: Home / Self Care | Payer: Medicaid Other | Attending: Student in an Organized Health Care Education/Training Program | Admitting: Student in an Organized Health Care Education/Training Program

## 2023-01-11 DIAGNOSIS — K0889 Other specified disorders of teeth and supporting structures: Secondary | ICD-10-CM | POA: Insufficient documentation

## 2023-01-11 DIAGNOSIS — I10 Essential (primary) hypertension: Secondary | ICD-10-CM | POA: Diagnosis not present

## 2023-01-11 LAB — TROPONIN I (HIGH SENSITIVITY): Troponin I (High Sensitivity): 3 ng/L (ref <18)

## 2023-01-11 LAB — BASIC METABOLIC PANEL
Anion gap: 11 (ref 5–15)
BUN: 13 mg/dL (ref 6–20)
CO2: 23 mmol/L (ref 22–32)
Calcium: 8.5 mg/dL — ABNORMAL LOW (ref 8.9–10.3)
Chloride: 98 mmol/L (ref 98–111)
Creatinine, Ser: 0.81 mg/dL (ref 0.44–1.00)
GFR, Estimated: >60 mL/min (ref >60)
Glucose, Bld: 121 mg/dL — ABNORMAL HIGH (ref 70–99)
Potassium: 2.9 mmol/L — ABNORMAL LOW (ref 3.5–5.1)
Sodium: 132 mmol/L — ABNORMAL LOW (ref 135–145)

## 2023-01-11 LAB — CBC
HCT: 35.4 % — ABNORMAL LOW (ref 36.0–46.0)
Hemoglobin: 11.8 g/dL — ABNORMAL LOW (ref 12.0–15.0)
MCH: 29.4 pg (ref 26.0–34.0)
MCHC: 33.3 g/dL (ref 30.0–36.0)
MCV: 88.1 fL (ref 80.0–100.0)
Platelets: 345 10*3/uL (ref 150–400)
RBC: 4.02 MIL/uL (ref 3.87–5.11)
RDW: 11.9 % (ref 11.5–15.5)
WBC: 5.4 10*3/uL (ref 4.0–10.5)
nRBC: 0 % (ref 0.0–0.2)

## 2023-01-11 MED ORDER — OXYCODONE-ACETAMINOPHEN 5-325 MG PO TABS
1.0000 | ORAL_TABLET | Freq: Once | ORAL | Status: AC
  Administered 2023-01-11: 1 via ORAL
  Filled 2023-01-11: qty 1

## 2023-01-11 MED ORDER — POTASSIUM CHLORIDE CRYS ER 20 MEQ PO TBCR
40.0000 meq | EXTENDED_RELEASE_TABLET | Freq: Once | ORAL | Status: AC
  Administered 2023-01-11: 40 meq via ORAL
  Filled 2023-01-11: qty 2

## 2023-01-11 MED ORDER — POTASSIUM CHLORIDE 20 MEQ PO PACK: 60.0000 meq | PACK | Freq: Once | ORAL | Status: DC

## 2023-01-11 MED ORDER — NAPROXEN 500 MG PO TABS: 500.0000 mg | ORAL_TABLET | Freq: Two times a day (BID) | ORAL | 0 refills | Status: AC

## 2023-01-11 NOTE — ED Triage Notes (Addendum)
Pt to ED from home via POV due to mouth pain. She has a dentist apt scheduled for June 17. She is having a headache as well with her dental pain. Pt is CAOx4, in no acute distress and ambulatory in triage.  Pt also states "I am dizzy when I stand like I might pass out". This has been going on for about a month. It is intermittent.

## 2023-01-11 NOTE — ED Provider Notes (Signed)
[facility] Provider Note    Event Date/Time   First MD Initiated Contact with Patient 01/11/23 1826     (approximate)   History   Dental Pain   HPI  Caitlin [name] is a 57 y.o. female with a history of poor dentition and severe gingivitis follow-up with dental office with plan for procedure on the 17th of next month currently on antibiotics presents to the ER for lightheadedness particular with standing and dental pain.  She has not had any fevers.  States that she stopped taking the antibiotic because she was thinking this was causing her headache.  States that she has not been eating as much because it hurts to chew.  Denies any chest pain or shortness of breath.     Physical Exam   Triage Vital Signs: ED Triage Vitals  Enc Vitals Group     BP 01/11/23 1805 132/79     Pulse Rate 01/11/23 1805 74     Resp 01/11/23 1805 16     Temp 01/11/23 1805 98 F (36.7 C)     Temp Source 01/11/23 1805 Oral     SpO2 01/11/23 1805 98 %     Weight 01/11/23 1803 188 lb (85.3 kg)     Height 01/11/23 1803 5\' 4"  (1.626 m)     Head Circumference --      Peak Flow --      Pain Score 01/11/23 1803 8     Pain Loc --      Pain Edu? --      Excl. in GC? --     Most recent vital signs: Vitals:   01/11/23 1805  BP: 132/79  Pulse: 74  Resp: 16  Temp: 98 F (36.7 C)  SpO2: 98%     Constitutional: Alert  Eyes: Conjunctivae are normal.  Head: Atraumatic. Nose: No congestion/rhinnorhea. Mouth/Throat: Mucous membranes are moist.  Poor dentition with significant gingival changes.  No sublingual firmness or erythema.  No trismus.  No fluctuance or abscess. Neck: Painless ROM.  Cardiovascular:   Good peripheral circulation. Respiratory: Normal respiratory effort.  No retractions.  Gastrointestinal: Soft and nontender.  Musculoskeletal:  no deformity Neurologic:  MAE spontaneously. No gross focal neurologic deficits are appreciated.  Skin:  Skin is warm, dry  and intact. No rash noted. Psychiatric: Mood and affect are normal. Speech and behavior are normal.    ED Results / Procedures / Treatments   Labs (all labs ordered are listed, but only abnormal results are displayed) Labs Reviewed  BASIC METABOLIC PANEL - Abnormal; Notable for the following components:      Result Value   Sodium 132 (*)    Potassium 2.9 (*)    Glucose, Bld 121 (*)    Calcium 8.5 (*)    All other components within normal limits  CBC - Abnormal; Notable for the following components:   Hemoglobin 11.8 (*)    HCT 35.4 (*)    All other components within normal limits  TROPONIN I (HIGH SENSITIVITY)     EKG ED ECG REPORT I, Willy Eddy, the attending physician, personally viewed and interpreted this ECG.   Date: 01/11/2023  EKG Time: 18:15  Rate: 70  Rhythm: sinus  Axis: normal  Intervals: normal  ST&T Change: no stemi, no depressions     RADIOLOGY   PROCEDURES:  Critical Care performed: No  Procedures   MEDICATIONS ORDERED IN ED: Medications  oxyCODONE-acetaminophen (PERCOCET/ROXICET) 5-325 MG per tablet 1 tablet (has  no administration in time range)  potassium chloride SA (KLOR-CON M) CR tablet 40 mEq (has no administration in time range)     IMPRESSION / MDM / ASSESSMENT AND PLAN / ED COURSE  I reviewed the triage vital signs and the nursing notes.                              Differential diagnosis includes, but is not limited to, dehydration, electrolyte abnormality, carious dentition, Ludwig's angina, periapical abscess, dry socket, gingivitis  Patient presented to the ER for evaluation symptoms as described above she is nontoxic.  Able to ambulate down the hall without any discomfort no near syncopal symptoms.  Does have mild hypokalemia and drop in her sodium likely secondary to poor p.o. intake but patient is tolerating p.o.  Do not feel that she needs IV fluids.  Patient will be given pain medication given her significant  findings of gingivitis.  She was instructed on the importance of following up with dentistry as well as continue take antibiotics as needed.  States that she needs prescription for anti-inflammatories as she is not able to afford them over-the-counter.  Will be given prescription for naproxen.  Discussed signs and symptoms which she should return to the ER.   Clinical Course as of 01/11/23 1903  Wynelle Link Jan 11, 2023  1850 Chest x-ray my review and interpretation without evidence of consolidation.  No pneumothorax. [PR]    Clinical Course User Index [PR] Willy Eddy, MD     FINAL CLINICAL IMPRESSION(S) / ED DIAGNOSES   Final diagnoses:  Pain, dental     Rx / DC Orders   ED Discharge Orders          Ordered    naproxen (NAPROSYN) 500 MG tablet  2 times daily with meals        01/11/23 1859             Note:  This document was prepared using Dragon voice recognition software and may include unintentional dictation errors.    Willy Eddy, MD 01/11/23 [phone]

## 2023-01-23 ENCOUNTER — Ambulatory Visit: Payer: Medicaid Other | Admitting: Allergy

## 2023-01-28 ENCOUNTER — Emergency Department
Admission: EM | Admit: 2023-01-28 | Discharge: 2023-01-29 | Disposition: A | Discharge status: Home / Self Care | Payer: Medicaid Other | Attending: Emergency Medicine | Admitting: Emergency Medicine

## 2023-01-28 ENCOUNTER — Emergency Department: Payer: Medicaid Other

## 2023-01-28 ENCOUNTER — Other Ambulatory Visit: Payer: Self-pay

## 2023-01-28 DIAGNOSIS — R0789 Other chest pain: Secondary | ICD-10-CM | POA: Insufficient documentation

## 2023-01-28 DIAGNOSIS — I10 Essential (primary) hypertension: Secondary | ICD-10-CM | POA: Diagnosis not present

## 2023-01-28 DIAGNOSIS — R079 Chest pain, unspecified: Secondary | ICD-10-CM

## 2023-01-28 DIAGNOSIS — J45909 Unspecified asthma, uncomplicated: Secondary | ICD-10-CM | POA: Insufficient documentation

## 2023-01-28 LAB — TROPONIN I (HIGH SENSITIVITY)
Troponin I (High Sensitivity): 3 ng/L (ref <18)
Troponin I (High Sensitivity): 3 ng/L (ref <18)

## 2023-01-28 LAB — CBC
HCT: 32.3 % — ABNORMAL LOW (ref 36.0–46.0)
Hemoglobin: 10.7 g/dL — ABNORMAL LOW (ref 12.0–15.0)
MCH: 29.6 pg (ref 26.0–34.0)
MCHC: 33.1 g/dL (ref 30.0–36.0)
MCV: 89.5 fL (ref 80.0–100.0)
Platelets: 477 10*3/uL — ABNORMAL HIGH (ref 150–400)
RBC: 3.61 MIL/uL — ABNORMAL LOW (ref 3.87–5.11)
RDW: 13 % (ref 11.5–15.5)
WBC: 5.2 10*3/uL (ref 4.0–10.5)
nRBC: 0 % (ref 0.0–0.2)

## 2023-01-28 LAB — HEPATIC FUNCTION PANEL
ALT: 20 U/L (ref 0–44)
AST: 22 U/L (ref 15–41)
Albumin: 4.1 g/dL (ref 3.5–5.0)
Alkaline Phosphatase: 63 U/L (ref 38–126)
Bilirubin, Direct: <0.1 mg/dL (ref 0.0–0.2)
Total Bilirubin: 0.6 mg/dL (ref 0.3–1.2)
Total Protein: 7.7 g/dL (ref 6.5–8.1)

## 2023-01-28 LAB — BASIC METABOLIC PANEL
Anion gap: 10 (ref 5–15)
BUN: 18 mg/dL (ref 6–20)
CO2: 27 mmol/L (ref 22–32)
Calcium: 9.6 mg/dL (ref 8.9–10.3)
Chloride: 101 mmol/L (ref 98–111)
Creatinine, Ser: 1.09 mg/dL — ABNORMAL HIGH (ref 0.44–1.00)
GFR, Estimated: 60 mL/min — ABNORMAL LOW (ref >60)
Glucose, Bld: 100 mg/dL — ABNORMAL HIGH (ref 70–99)
Potassium: 3.4 mmol/L — ABNORMAL LOW (ref 3.5–5.1)
Sodium: 138 mmol/L (ref 135–145)

## 2023-01-28 LAB — LIPASE, BLOOD: Lipase: 40 U/L (ref 11–51)

## 2023-01-28 MED ORDER — OMEPRAZOLE MAGNESIUM 20 MG PO TBEC: 40.0000 mg | DELAYED_RELEASE_TABLET | Freq: Every day | ORAL | 1 refills | Status: DC

## 2023-01-28 MED ORDER — FAMOTIDINE 20 MG PO TABS
40.0000 mg | ORAL_TABLET | Freq: Once | ORAL | Status: AC
  Administered 2023-01-28: 40 mg via ORAL
  Filled 2023-01-28: qty 2

## 2023-01-28 MED ORDER — FAMOTIDINE 20 MG PO TABS: 20.0000 mg | ORAL_TABLET | Freq: Two times a day (BID) | ORAL | 1 refills | Status: DC

## 2023-01-28 MED ORDER — ALUM & MAG HYDROXIDE-SIMETH 200-200-20 MG/5ML PO SUSP
30.0000 mL | Freq: Once | ORAL | Status: AC
  Administered 2023-01-28: 30 mL via ORAL
  Filled 2023-01-28: qty 30

## 2023-01-28 MED ORDER — PANTOPRAZOLE SODIUM 40 MG PO TBEC: 40.0000 mg | DELAYED_RELEASE_TABLET | Freq: Every day | ORAL | Status: DC

## 2023-01-28 NOTE — Discharge Instructions (Signed)
Take 40 mg of Pepcid once daily. Take 40 mg of omeprazole once daily. Please make follow-up appointment with GI.

## 2023-01-28 NOTE — ED Provider Notes (Signed)
[facility] Provider Note  Patient Contact: 10:23 PM (approximate)   History   Chest Pain   HPI  Caitlin [name] is a 57 y.o. female with a history of hypertension and asthma, presents to the emergency department with concern for acid reflux.  Patient states that she has experienced the symptoms multiple times in the past and it presents with some chest tightness and pressure.  She states that she has been taking Tums but has not been relieving her symptoms.  She has had no associated shortness of breath.  No vomiting or abdominal pain.  No fever, chills or cough.      Physical Exam   Triage Vital Signs: ED Triage Vitals  Enc Vitals Group     BP 01/28/23 2002 129/62     Pulse Rate 01/28/23 2002 70     Resp 01/28/23 2002 18     Temp 01/28/23 2002 98.5 F (36.9 C)     Temp Source 01/28/23 2002 Oral     SpO2 01/28/23 2002 95 %     Weight 01/28/23 2010 180 lb (81.6 kg)     Height 01/28/23 2010 5\' 4"  (1.626 m)     Head Circumference --      Peak Flow --      Pain Score 01/28/23 2010 8     Pain Loc --      Pain Edu? --      Excl. in GC? --     Most recent vital signs: Vitals:   01/28/23 2002  BP: 129/62  Pulse: 70  Resp: 18  Temp: 98.5 F (36.9 C)  SpO2: 95%     General: Alert and in no acute distress. Eyes:  PERRL. EOMI. Head: No acute traumatic findings ENT:      Nose: No congestion/rhinnorhea.      Mouth/Throat: Mucous membranes are moist. Neck: No stridor. No cervical spine tenderness to palpation. Cardiovascular:  Good peripheral perfusion Respiratory: Normal respiratory effort without tachypnea or retractions. Lungs CTAB. Good air entry to the bases with no decreased or absent breath sounds. Gastrointestinal: Bowel sounds 4 quadrants. Soft and nontender to palpation. No guarding or rigidity. No palpable masses. No distention. No CVA tenderness. Musculoskeletal: Full range of motion to all extremities.  Neurologic:  No gross  focal neurologic deficits are appreciated.  Skin:   No rash noted Other:   ED Results / Procedures / Treatments   Labs (all labs ordered are listed, but only abnormal results are displayed) Labs Reviewed  BASIC METABOLIC PANEL - Abnormal; Notable for the following components:      Result Value   Potassium 3.4 (*)    Glucose, Bld 100 (*)    Creatinine, Ser 1.09 (*)    GFR, Estimated 60 (*)    All other components within normal limits  CBC - Abnormal; Notable for the following components:   RBC 3.61 (*)    Hemoglobin 10.7 (*)    HCT 32.3 (*)    Platelets 477 (*)    All other components within normal limits  HEPATIC FUNCTION PANEL  LIPASE, BLOOD  TROPONIN I (HIGH SENSITIVITY)  TROPONIN I (HIGH SENSITIVITY)        RADIOLOGY  I personally viewed and evaluated these images as part of my medical decision making, as well as reviewing the written report by the radiologist.  ED Provider Interpretation: No acute abnormality on chest x-ray.   PROCEDURES:  Critical Care performed: No  Procedures   MEDICATIONS  ORDERED IN ED: Medications  pantoprazole (PROTONIX) EC tablet 40 mg (has no administration in time range)  alum & mag hydroxide-simeth (MAALOX/MYLANTA) 200-200-20 MG/5ML suspension 30 mL (30 mLs Oral Given 01/28/23 2305)  famotidine (PEPCID) tablet 40 mg (40 mg Oral Given 01/28/23 2303)     IMPRESSION / MDM / ASSESSMENT AND PLAN / ED COURSE  I reviewed the triage vital signs and the nursing notes.                              Assessment and plan Chest discomfort 57 year old female presents to the emergency department with chest discomfort after eating that is consistent with her prior episodes of acid reflux.  Vital signs are reassuring at triage.  On exam, patient alert and nontoxic-appearing.  No adventitious lung sounds were auscultated on exam.  CBC and BMP reassuring.  Chest x-ray unremarkable.  EKG indicates normal sinus rhythm without ST segment  elevation or other apparent arrhythmia.  Will obtain hepatic function panel and lipase as well as second troponin and will reassess.  Will administer GI cocktail and Pepcid.   Hepatic function panel and lipase reassuring.  Upon reassessment, patient is feeling significantly improved.  Pepcid and omeprazole were sent to patient's pharmacy.  Patient care turned over to Dr. Dolores Frame at shift change to await second troponin.  FINAL CLINICAL IMPRESSION(S) / ED DIAGNOSES   Final diagnoses:  Chest pain, unspecified type     Rx / DC Orders   ED Discharge Orders          Ordered    omeprazole (PRILOSEC OTC) 20 MG tablet  Daily,   Status:  Discontinued        01/28/23 2320    famotidine (PEPCID) 20 MG tablet  2 times daily,   Status:  Discontinued        01/28/23 2320    famotidine (PEPCID) 20 MG tablet  2 times daily        01/28/23 2332    omeprazole (PRILOSEC OTC) 20 MG tablet  Daily        01/28/23 2332             Note:  This document was prepared using Dragon voice recognition software and may include unintentional dictation errors.   Pia Mau [employer], Cordelia Poche 01/28/23 2336    Chesley Noon, MD 02/01/23 [phone]

## 2023-01-28 NOTE — ED Provider Notes (Signed)
-----------------------------------------   11:49 PM on 01/28/2023 -----------------------------------------   Second troponin negative.  Patient will be discharged home, prescription sent to pharmacy by previous provider.  Strict return precautions given.  Patient verbalizes understanding agrees with plan of care.   Irean Hong, MD 01/28/23 [phone]

## 2023-01-28 NOTE — ED Triage Notes (Addendum)
Pt to ED via POV c/o chest pain. Pt states it has been going on for 3 days, mainly after she eats. Says she has taken acid reflux medication but does not help with pain. Pt reports pain is in center of chest and sometimes feels it moving up to throat, says it feels like squeezing. Does not radiate. Denies SOB, dizziness, fevers, N/V. Hx of HTN

## 2023-02-06 DIAGNOSIS — S0181XA Laceration without foreign body of other part of head, initial encounter: Secondary | ICD-10-CM | POA: Diagnosis not present

## 2023-02-13 ENCOUNTER — Other Ambulatory Visit: Payer: Self-pay | Admitting: Internal Medicine

## 2023-02-13 DIAGNOSIS — I10 Essential (primary) hypertension: Secondary | ICD-10-CM

## 2023-03-05 ENCOUNTER — Ambulatory Visit: Payer: Medicaid Other | Admitting: Internal Medicine

## 2023-03-23 ENCOUNTER — Other Ambulatory Visit: Payer: Self-pay | Admitting: Internal Medicine

## 2023-03-23 DIAGNOSIS — I10 Essential (primary) hypertension: Secondary | ICD-10-CM

## 2023-03-23 NOTE — Telephone Encounter (Signed)
Medication Refill - Medication: amLODipine (NORVASC) 10 MG tablet   valsartan-hydrochlorothiazide (DIOVAN-HCT) 320-25 MG tablet   Has the patient contacted their pharmacy? No.  Preferred Pharmacy (with phone number or street name): [facility] [address]), [place] - [address]  [address]) [place] [address]  Phone:  [phone]  Fax:  [phone]    Has the patient been seen for an appointment in the last year OR does the patient have an upcoming appointment? No   Agent: Please be advised that RX refills may take up to 3 business days. We ask that you follow-up with your pharmacy.

## 2023-03-24 MED ORDER — AMLODIPINE BESYLATE 10 MG PO TABS
ORAL_TABLET | ORAL | 0 refills | Status: DC
Start: 2023-03-24 — End: 2023-07-01

## 2023-03-24 MED ORDER — VALSARTAN-HYDROCHLOROTHIAZIDE 320-25 MG PO TABS
1.0000 | ORAL_TABLET | Freq: Every day | ORAL | 0 refills | Status: DC
Start: 2023-03-24 — End: 2023-07-01

## 2023-03-24 NOTE — Telephone Encounter (Signed)
Requested Prescriptions  Pending Prescriptions Disp Refills   valsartan-hydrochlorothiazide (DIOVAN-HCT) 320-25 MG tablet 90 tablet 0    Sig: Take 1 tablet by mouth daily.     Cardiovascular: ARB + Diuretic Combos Failed - 03/23/2023 12:27 PM      Failed - K in normal range and within 180 days    Potassium  Date Value Ref Range Status  01/28/2023 3.4 (L) 3.5 - 5.1 mmol/L Final  05/28/2012 3.1 (L) 3.5 - 5.1 mmol/L Final         Failed - Cr in normal range and within 180 days    Creat  Date Value Ref Range Status  09/28/2015 0.81 0.50 - 1.10 mg/dL Final   Creatinine, Ser  Date Value Ref Range Status  01/28/2023 1.09 (H) 0.44 - 1.00 mg/dL Final         Failed - Last BP in normal range    BP Readings from Last 1 Encounters:  01/28/23 (!) 142/70         Failed - Valid encounter within last 6 months    Recent Outpatient Visits           10 months ago Upper respiratory infection, acute   [place] [facility] Marcine Matar, MD   1 year ago Essential hypertension   [place] [facility] Marcine Matar, MD   1 year ago Essential hypertension   [facility] Marcine Matar, MD   1 year ago Primary hypertension   [place] [facility] Drucilla Chalet, RPH-CPP   1 year ago Essential hypertension   [place] [facility] Marcine Matar, MD       Future Appointments             In 4 weeks Pardue, Monico Blitz, DO [place] [facility]            Passed - Na in normal range and within 180 days    Sodium  Date Value Ref Range Status  01/28/2023 138 135 - 145 mmol/L Final  12/24/2021 139 134 - 144 mmol/L Final  05/28/2012 140 136 - 145 mmol/L Final         Passed - eGFR is 10 or above and within 180 days    GFR, Est African American  Date Value Ref Range Status  09/28/2015 >89 >=60 mL/min Final    GFR calc Af Amer  Date Value Ref Range Status  01/05/2020 82 >59 mL/min/1.73 Final    Comment:    **[facility] currently reports eGFR in compliance with the current**   recommendations of the [employer]. [facility] will   update reporting as new guidelines are published from the NKF-ASN   Task force.    GFR, Est Non African American  Date Value Ref Range Status  09/28/2015 86 >=60 mL/min Final    Comment:      The estimated GFR is a calculation valid for adults (>=72 years old) that uses the CKD-EPI algorithm to adjust for age and sex. It is   not to be used for children, pregnant women, hospitalized patients,    patients on dialysis, or with rapidly changing kidney function. According to the NKDEP, eGFR >89 is normal, 60-89 shows mild impairment, 30-59 shows moderate impairment, 15-29 shows severe impairment and <15 is ESRD.      GFR, Estimated  Date Value Ref  Range Status  01/28/2023 60 (L) >60 mL/min Final    Comment:    (NOTE) Calculated using the CKD-EPI Creatinine Equation (2021)    eGFR  Date Value Ref Range Status  12/24/2021 82 >59 mL/min/1.73 Final         Passed - Patient is not pregnant       amLODipine (NORVASC) 10 MG tablet 90 tablet 0    Sig: TAKE 1 TABLET BY MOUTH ONCE DAILY. TO LOWER BLOOD PRESSURE     Cardiovascular: Calcium Channel Blockers 2 Failed - 03/23/2023 12:27 PM      Failed - Last BP in normal range    BP Readings from Last 1 Encounters:  01/28/23 (!) 142/70         Failed - Valid encounter within last 6 months    Recent Outpatient Visits           10 months ago Upper respiratory infection, acute   [place] Marcine Matar, MD   1 year ago Essential hypertension   [place] [facility] Marcine Matar, MD   1 year ago Essential hypertension   [place] [facility] Marcine Matar, MD   1 year ago Primary hypertension    [place] [facility] Drucilla Chalet, RPH-CPP   1 year ago Essential hypertension    [facility] Marcine Matar, MD       Future Appointments             In 4 weeks Pardue, Monico Blitz, DO  [facility], [facility]            Passed - Last Heart Rate in normal range    Pulse Readings from Last 1 Encounters:  01/28/23 (!) 55

## 2023-04-22 ENCOUNTER — Encounter: Payer: Self-pay | Admitting: Family Medicine

## 2023-04-22 ENCOUNTER — Ambulatory Visit: Payer: Medicaid Other | Admitting: Family Medicine

## 2023-04-22 ENCOUNTER — Telehealth: Payer: Self-pay | Admitting: Family Medicine

## 2023-04-22 VITALS — BP 135/66 | HR 58 | Temp 98.1°F | Ht 65.0 in | Wt 181.0 lb

## 2023-04-22 DIAGNOSIS — E559 Vitamin D deficiency, unspecified: Secondary | ICD-10-CM | POA: Diagnosis not present

## 2023-04-22 DIAGNOSIS — J4540 Moderate persistent asthma, uncomplicated: Secondary | ICD-10-CM | POA: Diagnosis not present

## 2023-04-22 DIAGNOSIS — H547 Unspecified visual loss: Secondary | ICD-10-CM | POA: Diagnosis not present

## 2023-04-22 DIAGNOSIS — E782 Mixed hyperlipidemia: Secondary | ICD-10-CM

## 2023-04-22 DIAGNOSIS — H9193 Unspecified hearing loss, bilateral: Secondary | ICD-10-CM | POA: Insufficient documentation

## 2023-04-22 DIAGNOSIS — K219 Gastro-esophageal reflux disease without esophagitis: Secondary | ICD-10-CM

## 2023-04-22 DIAGNOSIS — I10 Essential (primary) hypertension: Secondary | ICD-10-CM | POA: Diagnosis not present

## 2023-04-22 DIAGNOSIS — L659 Nonscarring hair loss, unspecified: Secondary | ICD-10-CM | POA: Diagnosis not present

## 2023-04-22 DIAGNOSIS — R5382 Chronic fatigue, unspecified: Secondary | ICD-10-CM | POA: Diagnosis not present

## 2023-04-22 DIAGNOSIS — Z9189 Other specified personal risk factors, not elsewhere classified: Secondary | ICD-10-CM | POA: Diagnosis not present

## 2023-04-22 MED ORDER — MINOXIDIL 5 % EX FOAM
1.0000 | Freq: Two times a day (BID) | CUTANEOUS | 0 refills | Status: DC
Start: 2023-04-22 — End: 2024-06-20

## 2023-04-22 MED ORDER — BUDESONIDE-FORMOTEROL FUMARATE 80-4.5 MCG/ACT IN AERO: 2.0000 | INHALATION_SPRAY | Freq: Two times a day (BID) | RESPIRATORY_TRACT | 5 refills | Status: DC

## 2023-04-22 MED ORDER — ALBUTEROL SULFATE HFA 108 (90 BASE) MCG/ACT IN AERS: 2.0000 | INHALATION_SPRAY | Freq: Four times a day (QID) | RESPIRATORY_TRACT | 1 refills | Status: DC | PRN

## 2023-04-22 MED ORDER — CARVEDILOL 3.125 MG PO TABS: 3.1250 mg | ORAL_TABLET | Freq: Two times a day (BID) | ORAL | 3 refills | Status: DC

## 2023-04-22 MED ORDER — FAMOTIDINE 20 MG PO TABS
20.0000 mg | ORAL_TABLET | Freq: Every day | ORAL | 1 refills | Status: DC
Start: 2023-04-22 — End: 2024-02-22

## 2023-04-22 NOTE — Assessment & Plan Note (Signed)
Well-controlled today Continue amlodipine 10 mg daily and valsartan-hydrochlorothiazide 320-25 mg daily.

## 2023-04-22 NOTE — Telephone Encounter (Signed)
Received a fax from [employer]mymeds for Albuterol Sulfate HFA 130mcg/act  Key: [id number]

## 2023-04-22 NOTE — Telephone Encounter (Signed)
Received a fax from covermymeds for Breyna 80-4.90mcg  Key: [id number]

## 2023-04-22 NOTE — Progress Notes (Signed)
New patient visit   Patient: Caitlin [name]   DOB: 1966/04/15   57 y.o. Female  MRN: [id number] Visit Date: 04/22/2023  Today's healthcare provider: Sherlyn Hay, DO   Chief Complaint  Patient presents with   Establish Care   Subjective    Caitlin [name] is a 57 y.o. female who presents today as a new patient to establish care.  HPI  American sign language interpreter: Caitlin [name] [phone]   Patient endorses being tired frequently In particular, routinely feels tired after breakfast and it continues through the day.  Took doxycyline in May 2024 and amoxicillin in June 2024 for a dental infection.  Patient has not yet followed up with the dentist and due to difficulty with rides.  Using CPAP? Turned it in because she was told she didn't need it anymore Hx asthma -using albuterol intermittently.  Not using Symbicort regularly.  She lives with her daughter, however, there is mold in house and on walls and they may have to move  Vaccines?  Does want flu vaccine (allergic to eggs)  Does not want shingles vaccine.  Speech device?  Caitlin [name] - regarding the 2 way speech-generating device   - was not able to get it (not ordered based on chart)  - reading is sometimes difficult as English is not the first language;   - large font would be very effective though  Right eye seems slow to move and slow to focus. It is especially hard at night; has to turn on bright lights. Her peripheral vision also seems to be suffering.  Hairloss at hairline; has been ongoing over several years.   Past Medical History:  Diagnosis Date   Asthma    Deaf    GERD (gastroesophageal reflux disease)    Hypertension 2011   NO (nasal obstruction)    OSA (obstructive sleep apnea)    Past Surgical History:  Procedure Laterality Date   CESAREAN SECTION     CHOLECYSTECTOMY     TUBAL LIGATION     Family Status  Relation Name Status   Mother  Alive   Father  Alive   Neg Hx  (Not Specified)   No partnership data on file   Family History  Problem Relation Age of Onset   Heart disease Mother    Stroke Mother    Breast cancer Neg Hx    Social History   Socioeconomic History   Marital status: Legally Separated    Spouse name: Not on file   Number of children: Not on file   Years of education: Not on file   Highest education level: Not on file  Occupational History    Employer: UNEMPLOYED  Tobacco Use   Smoking status: Never   Smokeless tobacco: Never  Vaping Use   Vaping status: Never Used  Substance and Sexual Activity   Alcohol use: Yes    Alcohol/week: 1.0 standard drink of alcohol    Types: 1 Glasses of wine per week    Comment: occassionally    Drug use: Not Currently    Types: "Crack" cocaine   Sexual activity: Yes    Birth control/protection: Surgical  Other Topics Concern   Not on file  Social History Narrative   Not on file   Social Determinants of Health   Financial Resource Strain: Not on file  Food Insecurity: No Food Insecurity (04/22/2023)   Hunger Vital Sign    Worried About Running Out of Food in the Last Year: Never  true    Ran Out of Food in the Last Year: Never true  Transportation Needs: Unmet Transportation Needs (04/22/2023)   PRAPARE - Administrator, Civil Service (Medical): Yes    Lack of Transportation (Non-Medical): Yes  Physical Activity: Unknown (04/22/2023)   Exercise Vital Sign    Days of Exercise per Week: 0 days    Minutes of Exercise per Session: Not on file  Stress: Not on file  Social Connections: Moderately Isolated (04/22/2023)   Social Connection and Isolation Panel [NHANES]    Frequency of Communication with Friends and Family: More than three times a week    Frequency of Social Gatherings with Friends and Family: Once a week    Attends Religious Services: Never    Database administrator or Organizations: Yes    Attends Engineer, structural: More than 4 times per year    Marital Status: Separated    Outpatient Medications Prior to Visit  Medication Sig   amLODipine (NORVASC) 10 MG tablet TAKE 1 TABLET BY MOUTH ONCE DAILY. TO LOWER BLOOD PRESSURE   atorvastatin (LIPITOR) 10 MG tablet Take 1 tablet by mouth once daily   Blood Pressure Monitor KIT Use to check blood pressure daily   levocetirizine (XYZAL) 5 MG tablet Take 1 tablet (5 mg total) by mouth every evening.   naproxen (NAPROSYN) 500 MG tablet Take 1 tablet (500 mg total) by mouth 2 (two) times daily with a meal.   nystatin (MYCOSTATIN/NYSTOP) powder Apply 1 application topically 2 (two) times daily. Apply to affected area under breast   valsartan-hydrochlorothiazide (DIOVAN-HCT) 320-25 MG tablet Take 1 tablet by mouth daily.   [DISCONTINUED] albuterol (VENTOLIN HFA) 108 (90 Base) MCG/ACT inhaler Inhale 2 puffs into the lungs every 6 (six) hours as needed for wheezing or shortness of breath.   [DISCONTINUED] budesonide-formoterol (SYMBICORT) 80-4.5 MCG/ACT inhaler Inhale 2 puffs into the lungs in the morning and at bedtime.   [DISCONTINUED] carvedilol (COREG) 3.125 MG tablet TAKE 1 TABLET BY MOUTH TWICE DAILY WITH A MEAL   Misc. Devices MISC CPAP therapy on 17 cm H2O . Patient to use X-Small size Resmed Full Face Mask AirFit F10. Diagnosis - Obstructive sleep apnea (Patient not taking: Reported on 04/22/2023)   Olopatadine HCl 0.2 % SOLN Apply 1 drop to eye daily as needed (itchy/watery eyes). (Patient not taking: Reported on 04/22/2023)   omeprazole (PRILOSEC OTC) 20 MG tablet Take 2 tablets (40 mg total) by mouth daily for 14 days.   traZODone (DESYREL) 50 MG tablet Take 0.5-1 tablets (25-50 mg total) by mouth at bedtime as needed for sleep. (Patient not taking: Reported on 04/22/2023)   [DISCONTINUED] benzonatate (TESSALON) 100 MG capsule Take 1 capsule (100 mg total) by mouth 2 (two) times daily as needed for cough. (Patient not taking: Reported on 04/22/2023)   [DISCONTINUED] famotidine (PEPCID) 20 MG tablet Take 1 tablet (20 mg total)  by mouth 2 (two) times daily for 14 days.   [DISCONTINUED] triamcinolone (NASACORT) 55 MCG/ACT AERO nasal inhaler Place 2 sprays into the nose daily. (Patient not taking: Reported on 04/22/2023)   [DISCONTINUED] Vitamin D, Ergocalciferol, (DRISDOL) 1.25 MG (50000 UNIT) CAPS capsule Take 1 capsule (50,000 Units total) by mouth every 7 (seven) days. (Patient not taking: Reported on 04/22/2023)   No facility-administered medications prior to visit.   Allergies  Allergen Reactions   Egg-Derived Products Nausea And Vomiting   Milk-Related Compounds     Immunization History  Administered Date(s) Administered  Influenza,inj,Quad PF,6+ Mos 05/22/2014, 06/16/2019, 09/25/2020, 06/21/2021   Moderna Sars-Covid-2 Vaccination 06/12/2020, 07/10/2020   PNEUMOCOCCAL CONJUGATE-20 06/21/2021   Tdap 06/16/2019    Health Maintenance  Topic Date Due   Hepatitis C Screening  Never done   COVID-19 Vaccine (3 - 2023-24 season) 04/19/2023   Zoster Vaccines- Shingrix (1 of 2) 07/22/2023 (Originally 08/05/2016)   INFLUENZA VACCINE  11/16/2023 (Originally 03/19/2023)   PAP SMEAR-Modifier  07/05/2023   MAMMOGRAM  04/29/2024   Colonoscopy  05/24/2029   DTaP/Tdap/Td (2 - Td or Tdap) 06/15/2029   HIV Screening  Completed   HPV VACCINES  Aged Out    Patient Care Team: Sheldon Amara, Monico Blitz, DO as PCP - General (Family Medicine)  Review of Systems  Constitutional:  Negative for appetite change, chills, fatigue and fever.  Eyes:  Positive for visual disturbance.  Respiratory:  Negative for chest tightness and shortness of breath.   Cardiovascular:  Negative for chest pain and palpitations.  Gastrointestinal:  Negative for abdominal pain, nausea and vomiting.  Neurological:  Negative for dizziness and weakness.        Objective    BP 135/66 (BP Location: Left Arm, Patient Position: Sitting, Cuff Size: Normal)   Pulse (!) 58   Temp 98.1 F (36.7 C) (Oral)   Ht 5\' 5"  (1.651 m)   Wt 181 lb (82.1 kg)   LMP  05/08/2016   SpO2 100%   BMI 30.12 kg/m     Physical Exam Vitals and nursing note reviewed.  Constitutional:      General: She is not in acute distress.    Appearance: Normal appearance.  HENT:     Head: Normocephalic and atraumatic.  Eyes:     General: No scleral icterus.    Conjunctiva/sclera: Conjunctivae normal.  Cardiovascular:     Rate and Rhythm: Normal rate.  Pulmonary:     Effort: Pulmonary effort is normal.  Neurological:     Mental Status: She is alert and oriented to person, place, and time. Mental status is at baseline.  Psychiatric:        Mood and Affect: Mood normal.        Behavior: Behavior normal.     Depression Screen    04/22/2023   10:02 AM 12/24/2021   11:17 AM 09/26/2021    9:57 AM 06/21/2021   10:34 AM  PHQ 2/9 Scores  PHQ - 2 Score 0 0 1 0  PHQ- 9 Score  0     No results found for any visits on 04/22/23.  Assessment & Plan     Primary hypertension Assessment & Plan: Well-controlled today Continue amlodipine 10 mg daily and valsartan-hydrochlorothiazide 320-25 mg daily.  Orders: -     Comprehensive metabolic panel -     Lipid panel -     Carvedilol; Take 1 tablet (3.125 mg total) by mouth 2 (two) times daily with a meal.  Dispense: 60 tablet; Refill: 3 -     AMB Referral to Managed Medicaid Care Management  Gastroesophageal reflux disease, unspecified whether esophagitis present Assessment & Plan: Refill  Orders: -     Famotidine; Take 1 tablet (20 mg total) by mouth daily before breakfast.  Dispense: 30 tablet; Refill: 1  Moderate persistent asthma without complication Assessment & Plan: Patient has not had her Symbicort in some time She continues to use albuterol intermittently Denies recent exacerbations Will refer her to pulmonology for further recommendations  Orders: -     Ambulatory referral to Pulmonology -  Budesonide-Formoterol Fumarate; Inhale 2 puffs into the lungs in the morning and at bedtime.  Dispense: 1 each;  Refill: 5 -     Albuterol Sulfate HFA; Inhale 2 puffs into the lungs every 6 (six) hours as needed for wheezing or shortness of breath.  Dispense: 18 g; Refill: 1 -     AMB Referral to Managed Medicaid Care Management  Deaf, bilateral Assessment & Plan: Discussed Caitlin [name] 2 way speech-generating device with patient, which she was previously pursuing.  She states she would like to go ahead and try to get it.  She is willing and able to use it since it does offer a large font that she would be able to see easily.  - Referred as noted below.  Orders: -     Ambulatory referral to Speech Therapy -     AMB Referral to Managed Medicaid Care Management  Hair loss Assessment & Plan: Discussed options with patient.  Will try topical minoxidil and reassess on follow-up.  Orders: -     Minoxidil; Apply 1 Application topically 2 (two) times daily.  Dispense: 60 g; Refill: 0  Mixed hyperlipidemia -     Lipid panel  Vitamin D deficiency -     VITAMIN D 25 Hydroxy (Vit-D Deficiency, Fractures)  Chronic fatigue -     VITAMIN D 25 Hydroxy (Vit-D Deficiency, Fractures) -     Vitamin B12  Vision problem -     Ambulatory referral to Ophthalmology -     AMB Referral to Managed Medicaid Care Management  At risk for obstructive sleep apnea -     Ambulatory referral to Pulmonology    Return in about 4 weeks (around 05/20/2023) for CPE.     I discussed the assessment and treatment plan with the patient  The patient was provided an opportunity to ask questions and all were answered. The patient agreed with the plan and demonstrated an understanding of the instructions.   The patient was advised to call back or seek an in-person evaluation if the symptoms worsen or if the condition fails to improve as anticipated.  Total time was 60 minutes. That includes chart review before the visit, the actual patient visit, and time spent on documentation after the visit.    Sherlyn Hay, DO  [facility]  Health [facility] [phone] (phone) [phone] (fax)  [facility]

## 2023-04-24 NOTE — Telephone Encounter (Signed)
Archived on September 4 by [id number] [facility] for [id number]  Archived by [id number] 2 days ago for [facility]

## 2023-04-27 ENCOUNTER — Telehealth: Payer: Self-pay

## 2023-04-27 NOTE — Telephone Encounter (Signed)
Copied from CRM [id number]. Topic: Referral - Question >> Apr 27, 2023  1:04 PM Everette C wrote: Reason for CRM: Inetta Fermo with [facility] would like to speak with a member of referrals staff when possible   Please contact further when available

## 2023-04-30 NOTE — Assessment & Plan Note (Signed)
Discussed options with patient.  Will try topical minoxidil and reassess on follow-up.

## 2023-04-30 NOTE — Assessment & Plan Note (Signed)
Discussed Caitlin [name]/SComm 2 way speech-generating device with patient, which she was previously pursuing.  She states she would like to go ahead and try to get it.  She is willing and able to use it since it does offer a large font that she would be able to see easily.  - Referred as noted below.

## 2023-04-30 NOTE — Assessment & Plan Note (Signed)
Refill

## 2023-04-30 NOTE — Assessment & Plan Note (Addendum)
Patient has not had her Symbicort in some time She continues to use albuterol intermittently Denies recent exacerbations Will refer her to pulmonology for further recommendations

## 2023-05-01 ENCOUNTER — Other Ambulatory Visit: Payer: Medicaid Other

## 2023-05-01 NOTE — Patient Outreach (Signed)
Medicaid Managed Care Social Work Note  05/01/2023 Name:  Caitlin [name] MRN:  [id number] DOB:  November 14, 1965  Caitlin [name] is an 57 y.o. year old female who is a primary patient of Caitlin Hay, DO.  The Medicaid Managed Care Coordination team was consulted for assistance with:  Community Resources   Ms. Lawson was given information about Medicaid Managed Care Coordination team services today. Caitlin [name] Patient agreed to services and verbal consent obtained.  Engaged with patient  for by telephone forinitial visit in response to referral for case management and/or care coordination services.   Assessments/Interventions:  Review of past medical history, allergies, medications, health status, including review of consultants reports, laboratory and other test data, was performed as part of comprehensive evaluation and provision of chronic care management services.  SDOH: (Social Determinant of Health) assessments and interventions performed: SDOH Interventions    Flowsheet Row Office Visit from 04/22/2023 in [facility]  SDOH Interventions   Food Insecurity Interventions Intervention Not Indicated  Housing Interventions Intervention Not Indicated  Utilities Interventions Intervention Not Indicated  Social Connections Interventions Intervention Not Indicated     BSW completed a telephone outreach with patient via sign language interpreter. Patient states she needs resources for transportation to doctors appointments and to the store. Patient does receieve SSI monthly and will soon be moving in with her daughter. BSW will mail patient resources for transportation. No other resources are needed at this time.  Advanced Directives Status:  Not addressed in this encounter.  Care Plan                 Allergies  Allergen Reactions   Egg-Derived Products Nausea And Vomiting   Milk-Related Compounds     Medications Reviewed Today   Medications  were not reviewed in this encounter     Patient Active Problem List   Diagnosis Date Noted   Mixed hyperlipidemia 04/22/2023   Vitamin D deficiency 04/22/2023   Chronic fatigue 04/22/2023   Vision problem 04/22/2023   Moderate persistent asthma without complication 04/22/2023   At risk for obstructive sleep apnea 04/22/2023   Deaf, bilateral 04/22/2023   Viral URI with cough 09/28/2015   Obesity (BMI 30.0-34.9) 11/20/2014   Menopausal symptoms 07/27/2014   Hair loss 05/01/2014   Onychomycosis 05/01/2014   Essential hypertension 03/06/2012   GERD (gastroesophageal reflux disease) 03/06/2012    Conditions to be addressed/monitored per PCP order:   transportation  There are no care plans that you recently modified to display for this patient.   Follow up:  Patient agrees to Care Plan and Follow-up.  Plan: The Managed Medicaid care management team will reach out to the patient again over the next 30-45 days.  Date/time of next scheduled Social Work care management/care coordination outreach:  06/03/23  Gus Puma, Kenard Gower, [facility] Health  Managed [facility] Social Worker [phone]

## 2023-05-01 NOTE — Patient Instructions (Signed)
Visit Information  Ms. Empie was given information about Medicaid Managed Care team care coordination services as a part of their [facility] Medicaid benefit. Lourena Simmonds Glore verbally consented to engagement with the [facility] Managed Care team.   If you are experiencing a medical emergency, please call 911 or report to your local emergency department or urgent care.   If you have a non-emergency medical problem during routine business hours, please contact your provider's office and ask to speak with a nurse.   For questions related to your Healthy [facility] health plan, please call: [phone] or visit the homepage here: [email or web address]  If you would like to schedule transportation through your Healthy [facility] plan, please call the following number at least 2 days in advance of your appointment: [phone]  For information about your ride after you set it up, call Ride Assist at [phone]. Use this number to activate a Will Call pickup, or if your transportation is late for a scheduled pickup. Use this number, too, if you need to make a change or cancel a previously scheduled reservation.  If you need transportation services right away, call [phone]. The after-hours call center is staffed 24 hours to handle ride assistance and urgent reservation requests (including discharges) 365 days a year. Urgent trips include sick visits, hospital discharge requests and life-sustaining treatment.  Call the [facility] Line at [phone], at any time, 24 hours a day, 7 days a week. If you are in danger or need immediate medical attention call 911.  If you would like help to quit smoking, call 1-800-QUIT-NOW ((323)813-8882) OR Espaol: 1-855-Djelo-Ya (3-220-254-2706) o para ms informacin haga clic aqu or Text READY to 237-628 to register via text  Ms. Proby - following are the goals we discussed in your visit today:    Goals Addressed   None      Social Worker will follow up on 06/03/23.   Gus Puma, Kenard Gower, MHA [facility] Health  Managed Medicaid Social Worker [phone]   Following is a copy of your plan of care:  There are no care plans that you recently modified to display for this patient.

## 2023-05-15 ENCOUNTER — Ambulatory Visit: Payer: Self-pay

## 2023-05-15 NOTE — Telephone Encounter (Signed)
  Chief Complaint: medication problem Symptoms: low HR at times and feels funny Frequency: several weeks but only happens couple of times a week Pertinent Negatives: Patient denies any current sx Disposition: [] ED /[] Urgent Care (no appt availability in office) / [x] Appointment(In office/virtual)/ []  [place] [facility]/ [] Home Care/ [] Refused Recommended Disposition /[]  Mobile Bus/ []  Follow-up with PCP Additional Notes: Pt states she has been having periods of low HR and feeling funny but then goes normal. Pt has upcoming appt on 05/22/23 at 1040 with PCP. Advised pt to monitor sx and if gets worse to call back for sooner appt. Pt preferred to continue medication until she is seen so her BP isn't affected.   Summary: medication reaction   Patient called using sign language interpreter [phone]. Patient stated she is on blood pressure medication and when she take it she feels funny. Her heart is beating slow and then seconds later she is fine. If he miss a dose she feels fine. Please f/u with patient         Reason for Disposition  [1] Caller has NON-URGENT medicine question about med that PCP prescribed AND [2] triager unable to answer question  Answer Assessment - Initial Assessment Questions 1. NAME of MEDICINE: "What medicine(s) are you calling about?"     Amlodipine carvedilol  2. QUESTION: "What is your question?" (e.g., double dose of medicine, side effect)     Unsure if sx r/t medication or needing to stop couple days out of the week  3. PRESCRIBER: "Who prescribed the medicine?" Reason: if prescribed by specialist, call should be referred to that group.     Dr. Payton Mccallum 4. SYMPTOMS: "Do you have any symptoms?" If Yes, ask: "What symptoms are you having?"  "How bad are the symptoms (e.g., mild, moderate, severe)     Feeling funny and having low HR and then goes back normal  Protocols used: Medication Question Call-A-AH

## 2023-05-22 ENCOUNTER — Ambulatory Visit: Payer: Medicaid Other | Admitting: Family Medicine

## 2023-05-22 NOTE — Progress Notes (Deleted)
      Established patient visit   Patient: Caitlin [name]   DOB: 1966-03-25   57 y.o. Female  MRN: [id number] Visit Date: 05/22/2023  Today's healthcare provider: Sherlyn Hay, DO   No chief complaint on file.  Subjective    HPI   ***  {History (Optional):23778}  Medications: Outpatient Medications Prior to Visit  Medication Sig   albuterol (VENTOLIN HFA) 108 (90 Base) MCG/ACT inhaler Inhale 2 puffs into the lungs every 6 (six) hours as needed for wheezing or shortness of breath.   amLODipine (NORVASC) 10 MG tablet TAKE 1 TABLET BY MOUTH ONCE DAILY. TO LOWER BLOOD PRESSURE   atorvastatin (LIPITOR) 10 MG tablet Take 1 tablet by mouth once daily   Blood Pressure Monitor KIT Use to check blood pressure daily   budesonide-formoterol (SYMBICORT) 80-4.5 MCG/ACT inhaler Inhale 2 puffs into the lungs in the morning and at bedtime.   carvedilol (COREG) 3.125 MG tablet Take 1 tablet (3.125 mg total) by mouth 2 (two) times daily with a meal.   famotidine (PEPCID) 20 MG tablet Take 1 tablet (20 mg total) by mouth daily before breakfast.   levocetirizine (XYZAL) 5 MG tablet Take 1 tablet (5 mg total) by mouth every evening.   Minoxidil 5 % FOAM Apply 1 Application topically 2 (two) times daily.   Misc. Devices MISC CPAP therapy on 17 cm H2O . Patient to use X-Small size Resmed Full Face Mask AirFit F10. Diagnosis - Obstructive sleep apnea (Patient not taking: Reported on 04/22/2023)   naproxen (NAPROSYN) 500 MG tablet Take 1 tablet (500 mg total) by mouth 2 (two) times daily with a meal.   nystatin (MYCOSTATIN/NYSTOP) powder Apply 1 application topically 2 (two) times daily. Apply to affected area under breast   Olopatadine HCl 0.2 % SOLN Apply 1 drop to eye daily as needed (itchy/watery eyes). (Patient not taking: Reported on 04/22/2023)   omeprazole (PRILOSEC OTC) 20 MG tablet Take 2 tablets (40 mg total) by mouth daily for 14 days.   traZODone (DESYREL) 50 MG tablet Take 0.5-1 tablets  (25-50 mg total) by mouth at bedtime as needed for sleep. (Patient not taking: Reported on 04/22/2023)   valsartan-hydrochlorothiazide (DIOVAN-HCT) 320-25 MG tablet Take 1 tablet by mouth daily.   No facility-administered medications prior to visit.    Review of Systems  ***  {Insert previous labs (optional):23779} {See past labs  Heme  Chem  Endocrine  Serology  Results Review (optional):1}   Objective    LMP 05/08/2016  {Insert last BP/Wt (optional):23777}{See vitals history (optional):1}   Physical Exam  ***  No results found for any visits on 05/22/23.  Assessment & Plan    There are no diagnoses linked to this encounter.   ***  No follow-ups on file.      I discussed the assessment and treatment plan with the patient  The patient was provided an opportunity to ask questions and all were answered. The patient agreed with the plan and demonstrated an understanding of the instructions.   The patient was advised to call back or seek an in-person evaluation if the symptoms worsen or if the condition fails to improve as anticipated.    Sherlyn Hay, DO  [facility] [phone] (phone) [phone] (fax)  [facility]

## 2023-06-03 ENCOUNTER — Other Ambulatory Visit: Payer: Self-pay

## 2023-06-03 NOTE — Patient Instructions (Signed)
Visit Information  Caitlin [name] was given information about Medicaid Managed Care team care coordination services as a part of their Healthy [facility] Medicaid benefit. Caitlin [name] verbally consented to engagement with the [facility] Managed Care team.   If you are experiencing a medical emergency, please call 911 or report to your local emergency department or urgent care.   If you have a non-emergency medical problem during routine business hours, please contact your provider's office and ask to speak with a nurse.   For questions related to your Healthy [facility] health plan, please call: [phone] or visit the homepage here: [email or web address]  If you would like to schedule transportation through your Healthy [facility] plan, please call the following number at least 2 days in advance of your appointment: [phone]  For information about your ride after you set it up, call Ride Assist at [phone]. Use this number to activate a Will Call pickup, or if your transportation is late for a scheduled pickup. Use this number, too, if you need to make a change or cancel a previously scheduled reservation.  If you need transportation services right away, call [phone]. The after-hours call center is staffed 24 hours to handle ride assistance and urgent reservation requests (including discharges) 365 days a year. Urgent trips include sick visits, hospital discharge requests and life-sustaining treatment.  Call the [employer] Line at [phone], at any time, 24 hours a day, 7 days a week. If you are in danger or need immediate medical attention call 911.  If you would like help to quit smoking, call 1-800-QUIT-NOW (917-602-1730) OR Espaol: 1-855-Djelo-Ya (5-462-703-5009) o para ms informacin haga clic aqu or Text READY to 381-829 to register via text  Caitlin [name] - following are the goals we discussed in your visit today:    Goals Addressed   None      Social Worker will follow up in 30 days.   Gus Puma, Kenard Gower, MHA [facility]  Managed Medicaid Social Worker [phone]   Following is a copy of your plan of care:  There are no care plans that you recently modified to display for this patient.

## 2023-06-03 NOTE — Patient Outreach (Addendum)
  Medicaid Managed Care Social Work Note  06/03/2023 Name:  Caitlin [name] MRN:  [id number] DOB:  06-21-1966  Caitlin [name] is an 57 y.o. year old female who is a primary patient of Caitlin Hay, DO.  The [facility] Managed Care Coordination team was consulted for assistance with:  Transportation Needs   Ms. Caitlin [name] was given information about Medicaid Managed Care Coordination team services today. Caitlin [name] Patient agreed to services and verbal consent obtained.  Engaged with patient  for by telephone forfollow up visit in response to referral for case management and/or care coordination services.   Assessments/Interventions:  Review of past medical history, allergies, medications, health status, including review of consultants reports, laboratory and other test data, was performed as part of comprehensive evaluation and provision of chronic care management services.  SDOH: (Social Determinant of Health) assessments and interventions performed: SDOH Interventions    Flowsheet Row Office Visit from 04/22/2023 in [facility]  SDOH Interventions   Food Insecurity Interventions Intervention Not Indicated  Housing Interventions Intervention Not Indicated  Utilities Interventions Intervention Not Indicated  Social Connections Interventions Intervention Not Indicated     BSW completed a telephone outreach with patient using a sign language interpreter. Patient states she did receive the transportation resources. She has not moved in with her daughter yet and will complete the application for SCAT. No other resources are needed at this time.  Advanced Directives Status:  Not addressed in this encounter.  Care Plan                 Allergies  Allergen Reactions   Egg-Derived Products Nausea And Vomiting   Milk-Related Compounds     Medications Reviewed Today   Medications were not reviewed in this encounter     Patient Active Problem List    Diagnosis Date Noted   Mixed hyperlipidemia 04/22/2023   Vitamin D deficiency 04/22/2023   Chronic fatigue 04/22/2023   Vision problem 04/22/2023   Moderate persistent asthma without complication 04/22/2023   At risk for obstructive sleep apnea 04/22/2023   Deaf, bilateral 04/22/2023   Viral URI with cough 09/28/2015   Obesity (BMI 30.0-34.9) 11/20/2014   Menopausal symptoms 07/27/2014   Hair loss 05/01/2014   Onychomycosis 05/01/2014   Essential hypertension 03/06/2012   GERD (gastroesophageal reflux disease) 03/06/2012    Conditions to be addressed/monitored per PCP order:   transportation   There are no care plans that you recently modified to display for this patient.   Follow up:  Patient agrees to Care Plan and Follow-up.  Plan: The Managed Medicaid care management team will reach out to the patient again over the next 30 days.  Date/time of next scheduled Social Work care management/care coordination outreach:  07/06/23  Caitlin [name], Caitlin [name], [facility]  Managed [facility] Social Worker [phone]

## 2023-06-08 ENCOUNTER — Encounter: Payer: Self-pay | Admitting: Emergency Medicine

## 2023-06-08 ENCOUNTER — Other Ambulatory Visit: Payer: Self-pay

## 2023-06-08 ENCOUNTER — Emergency Department
Admission: EM | Admit: 2023-06-08 | Discharge: 2023-06-08 | Disposition: A | Discharge status: Home / Self Care | Payer: Medicaid Other | Attending: Emergency Medicine | Admitting: Emergency Medicine

## 2023-06-08 DIAGNOSIS — R42 Dizziness and giddiness: Secondary | ICD-10-CM | POA: Insufficient documentation

## 2023-06-08 DIAGNOSIS — M545 Low back pain, unspecified: Secondary | ICD-10-CM | POA: Insufficient documentation

## 2023-06-08 DIAGNOSIS — G8929 Other chronic pain: Secondary | ICD-10-CM | POA: Diagnosis not present

## 2023-06-08 DIAGNOSIS — I10 Essential (primary) hypertension: Secondary | ICD-10-CM | POA: Diagnosis not present

## 2023-06-08 DIAGNOSIS — R002 Palpitations: Secondary | ICD-10-CM | POA: Diagnosis not present

## 2023-06-08 LAB — BASIC METABOLIC PANEL
Anion gap: 7 (ref 5–15)
BUN: 13 mg/dL (ref 6–20)
CO2: 28 mmol/L (ref 22–32)
Calcium: 9 mg/dL (ref 8.9–10.3)
Chloride: 102 mmol/L (ref 98–111)
Creatinine, Ser: 0.89 mg/dL (ref 0.44–1.00)
GFR, Estimated: >60 mL/min (ref >60)
Glucose, Bld: 91 mg/dL (ref 70–99)
Potassium: 3.3 mmol/L — ABNORMAL LOW (ref 3.5–5.1)
Sodium: 137 mmol/L (ref 135–145)

## 2023-06-08 LAB — CBC
HCT: 36.2 % (ref 36.0–46.0)
Hemoglobin: 11.8 g/dL — ABNORMAL LOW (ref 12.0–15.0)
MCH: 28.9 pg (ref 26.0–34.0)
MCHC: 32.6 g/dL (ref 30.0–36.0)
MCV: 88.5 fL (ref 80.0–100.0)
Platelets: 397 10*3/uL (ref 150–400)
RBC: 4.09 MIL/uL (ref 3.87–5.11)
RDW: 12.6 % (ref 11.5–15.5)
WBC: 4.5 10*3/uL (ref 4.0–10.5)
nRBC: 0 % (ref 0.0–0.2)

## 2023-06-08 LAB — URINALYSIS, ROUTINE W REFLEX MICROSCOPIC
Bilirubin Urine: NEGATIVE
Glucose, UA: NEGATIVE mg/dL
Hgb urine dipstick: NEGATIVE
Ketones, ur: NEGATIVE mg/dL
Nitrite: NEGATIVE
Protein, ur: NEGATIVE mg/dL
Specific Gravity, Urine: 1.003 — ABNORMAL LOW (ref 1.005–1.030)
pH: 7 (ref 5.0–8.0)

## 2023-06-08 NOTE — ED Notes (Signed)
See triage note  Presents with some concerns of her heart fluttering  No pain    Provider in room on arrival

## 2023-06-08 NOTE — ED Provider Triage Note (Signed)
Emergency Medicine Provider Triage Evaluation Note  Azilda Steidel , a 57 y.o. female  was evaluated in triage.  Pt complains of dizziness, sob, heart flutters, and back pain for 2 days  Review of Systems  Positive:  Negative:   Physical Exam  BP (!) 155/82   Pulse 71   Temp 98.5 F (36.9 C) (Oral)   Resp 20   LMP 05/08/2016   SpO2 100%  Gen:   Awake, no distress   Resp:  Normal effort  MSK:   Moves extremities without difficulty  Other:    Medical Decision Making  Medically screening exam initiated at 1:34 PM.  Appropriate orders placed.  Omah Delahoussaye Toft was informed that the remainder of the evaluation will be completed by another provider, this initial triage assessment does not replace that evaluation, and the importance of remaining in the ED until their evaluation is complete.     Faythe Ghee, PA-C 06/08/23 1428

## 2023-06-08 NOTE — ED Provider Notes (Signed)
[facility] Provider Note   Event Date/Time   First MD Initiated Contact with Patient 06/08/23 1742     (approximate) History  Dizziness  HPI Caitlin [name] is a 57 y.o. female with a past medical history of bilateral deafness, chronic fatigue, obesity, hypertension, and hyperlipidemia who presents complaining of palpitations, lightheadedness, and chronic low back pain that has been on and off over the last month but worse in the last 2 days.  Patient states that she is having intermittent palpitations or "this sensation my heart stops" with associated lightheadedness.  Patient also states that she has a mild sensation that the room is spinning around her and the sensation improves after sitting for approximately 2 minutes. ROS: Patient currently denies any vision changes, tinnitus, difficulty speaking, facial droop, sore throat, chest pain, shortness of breath, abdominal pain, nausea/vomiting/diarrhea, dysuria, or weakness/numbness/paresthesias in any extremity   Physical Exam  Triage Vital Signs: ED Triage Vitals  Encounter Vitals Group     BP 06/08/23 1328 (!) 155/82     Systolic BP Percentile --      Diastolic BP Percentile --      Pulse Rate 06/08/23 1328 71     Resp 06/08/23 1328 20     Temp 06/08/23 1328 98.5 F (36.9 C)     Temp Source 06/08/23 1328 Oral     SpO2 06/08/23 1328 100 %     Weight 06/08/23 1730 181 lb (82.1 kg)     Height 06/08/23 1730 5\' 5"  (1.651 m)     Head Circumference --      Peak Flow --      Pain Score 06/08/23 1330 0     Pain Loc --      Pain Education --      Exclude from Growth Chart --    Most recent vital signs: Vitals:   06/08/23 1328 06/08/23 1744  BP: (!) 155/82 (!) 150/80  Pulse: 71 70  Resp: 20 20  Temp: 98.5 F (36.9 C) 98 F (36.7 C)  SpO2: 100% 100%   General: Awake, oriented x4. CV:  Good peripheral perfusion.  Resp:  Normal effort.  Abd:  No distention.  Other:  Middle-aged overweight  African-American female resting comfortably in no acute distress.  Bilateral deafness ED Results / Procedures / Treatments  Labs (all labs ordered are listed, but only abnormal results are displayed) Labs Reviewed  BASIC METABOLIC PANEL - Abnormal; Notable for the following components:      Result Value   Potassium 3.3 (*)    All other components within normal limits  CBC - Abnormal; Notable for the following components:   Hemoglobin 11.8 (*)    All other components within normal limits  URINALYSIS, ROUTINE W REFLEX MICROSCOPIC - Abnormal; Notable for the following components:   Color, Urine COLORLESS (*)    APPearance CLEAR (*)    Specific Gravity, Urine 1.003 (*)    Leukocytes,Ua TRACE (*)    Bacteria, UA RARE (*)    All other components within normal limits  CBG MONITORING, ED   EKG ED ECG REPORT I, Merwyn Katos, the attending physician, personally viewed and interpreted this ECG. Date: 06/08/2023 EKG Time: 1336 Rate: 67 Rhythm: normal sinus rhythm QRS Axis: normal Intervals: normal ST/T Wave abnormalities: normal Narrative Interpretation: no evidence of acute ischemia PROCEDURES: Critical Care performed: No .1-3 Lead EKG Interpretation  Performed by: Merwyn Katos, MD Authorized by: Merwyn Katos, MD  Interpretation: normal     ECG rate:  71   ECG rate assessment: normal     Rhythm: sinus rhythm     Ectopy: none     Conduction: normal    MEDICATIONS ORDERED IN ED: Medications - No data to display IMPRESSION / MDM / ASSESSMENT AND PLAN / ED COURSE  I reviewed the triage vital signs and the nursing notes.                             The patient is on the cardiac monitor to evaluate for evidence of arrhythmia and/or significant heart rate changes. Patient's presentation is most consistent with acute presentation with potential threat to life or bodily function. 57 year old female presents with palpitations. EKG: No STEMI and no evidence of Brugada's  sign, delta wave, epsilon wave, significantly prolonged QTc, or malignant arrhythmia. Based on H&P and testing, this patient appears to be low risk for emergent causes of palpitations such as, but not limited to, a malignant cardiac arrhythmia, ACS, pulmonary embolism, thyrotoxicosis, PNA, PTX.  The patient has been given strict return precautions and understands the need for further outpatient testing and treatment.  Dispo: Discharge home with cardiology follow-up   FINAL CLINICAL IMPRESSION(S) / ED DIAGNOSES   Final diagnoses:  Dizziness  Chronic bilateral low back pain without sciatica  Palpitations   Rx / DC Orders   ED Discharge Orders          Ordered    Ambulatory referral to Cardiology       Comments: If you have not heard from the Cardiology office within the next 72 hours please call [phone].   06/08/23 1811           Note:  This document was prepared using Dragon voice recognition software and may include unintentional dictation errors.   Merwyn Katos, MD 06/08/23 [phone]

## 2023-06-08 NOTE — ED Triage Notes (Signed)
Pt ambulatory to triage for dizziness, heart flutters, and back pain. Started a month ago off and on with last couple days worsening.   Video interpreter used.

## 2023-06-29 DIAGNOSIS — H5213 Myopia, bilateral: Secondary | ICD-10-CM | POA: Diagnosis not present

## 2023-06-30 ENCOUNTER — Institutional Professional Consult (permissible substitution): Payer: Medicaid Other | Admitting: Nurse Practitioner

## 2023-06-30 ENCOUNTER — Other Ambulatory Visit: Payer: Self-pay | Admitting: Internal Medicine

## 2023-06-30 DIAGNOSIS — I10 Essential (primary) hypertension: Secondary | ICD-10-CM

## 2023-07-01 NOTE — Telephone Encounter (Signed)
Requested Prescriptions  Pending Prescriptions Disp Refills   valsartan-hydrochlorothiazide (DIOVAN-HCT) 320-25 MG tablet [Pharmacy Med Name: Valsartan-hydroCHLOROthiazide 320-25 MG Oral Tablet] 90 tablet 0    Sig: Take 1 tablet by mouth once daily     Cardiovascular: ARB + Diuretic Combos Failed - 06/30/2023 11:46 AM      Failed - K in normal range and within 180 days    Potassium  Date Value Ref Range Status  06/08/2023 3.3 (L) 3.5 - 5.1 mmol/L Final  05/28/2012 3.1 (L) 3.5 - 5.1 mmol/L Final         Failed - Last BP in normal range    BP Readings from Last 1 Encounters:  06/08/23 (!) 150/80         Passed - Na in normal range and within 180 days    Sodium  Date Value Ref Range Status  06/08/2023 137 135 - 145 mmol/L Final  12/24/2021 139 134 - 144 mmol/L Final  05/28/2012 140 136 - 145 mmol/L Final         Passed - Cr in normal range and within 180 days    Creat  Date Value Ref Range Status  09/28/2015 0.81 0.50 - 1.10 mg/dL Final   Creatinine, Ser  Date Value Ref Range Status  06/08/2023 0.89 0.44 - 1.00 mg/dL Final         Passed - eGFR is 10 or above and within 180 days    GFR, Est African American  Date Value Ref Range Status  09/28/2015 >89 >=60 mL/min Final   GFR calc Af Amer  Date Value Ref Range Status  01/05/2020 82 >59 mL/min/1.73 Final    Comment:    **[facility] currently reports eGFR in compliance with the current**   recommendations of the [employer]. [facility] will   update reporting as new guidelines are published from the NKF-ASN   Task force.    GFR, Est Non African American  Date Value Ref Range Status  09/28/2015 86 >=60 mL/min Final    Comment:      The estimated GFR is a calculation valid for adults (>=68 years old) that uses the CKD-EPI algorithm to adjust for age and sex. It is   not to be used for children, pregnant women, hospitalized patients,    patients on dialysis, or with rapidly changing kidney  function. According to the NKDEP, eGFR >89 is normal, 60-89 shows mild impairment, 30-59 shows moderate impairment, 15-29 shows severe impairment and <15 is ESRD.      GFR, Estimated  Date Value Ref Range Status  06/08/2023 >60 >60 mL/min Final    Comment:    (NOTE) Calculated using the CKD-EPI Creatinine Equation (2021)    eGFR  Date Value Ref Range Status  12/24/2021 82 >59 mL/min/1.73 Final         Passed - Patient is not pregnant      Passed - Valid encounter within last 6 months    Recent Outpatient Visits           2 months ago Primary hypertension   [place] [facility], Monico Blitz, DO   1 year ago Upper respiratory infection, acute   [facility] [place] Marcine Matar, MD   1 year ago Essential hypertension   [place] [facility] [place] [facility] [place] Marcine Matar, MD   1 year ago Essential hypertension  [place] [facility] [place] [facility] [place]. [facility] Marcine Matar, MD   1 year ago Primary hypertension   [place] [facility] [place] [facility] [place]. [facility] Lois Huxley, Cornelius Moras, RPH-CPP       Future Appointments             In 1 week Gollan, Tollie Pizza, MD [place]             amLODipine (NORVASC) 10 MG tablet [Pharmacy Med Name: amLODIPine Besylate 10 MG Oral Tablet] 90 tablet 0    Sig: TAKE 1 TABLET BY MOUTH ONCE DAILY TO LOWER BLOOD PRESSURE     Cardiovascular: Calcium Channel Blockers 2 Failed - 06/30/2023 11:46 AM      Failed - Last BP in normal range    BP Readings from Last 1 Encounters:  06/08/23 (!) 150/80         Passed - Last Heart Rate in normal range    Pulse Readings from Last 1 Encounters:  06/08/23 70         Passed - Valid encounter within last 6 months    Recent Outpatient Visits           2 months ago Primary hypertension    [place] [facility] [place], Monico Blitz, DO   1 year ago Upper respiratory infection, acute   [place] [facility] [place]. [facility] Marcine Matar, MD   1 year ago Essential hypertension   [place] [facility] [place] [facility] [place]. [facility] Marcine Matar, MD   1 year ago Essential hypertension   [place] [facility] [place] [facility] [place]. [facility] Marcine Matar, MD   1 year ago Primary hypertension   [place] [facility] [place] [facility] [place]. [facility] Drucilla Chalet, RPH-CPP       Future Appointments             In 1 week Gollan, Tollie Pizza, MD [facility]

## 2023-07-06 ENCOUNTER — Other Ambulatory Visit: Payer: Self-pay

## 2023-07-06 NOTE — Patient Instructions (Signed)
Visit Information  Ms. Caitlin [name] was given information about Medicaid Managed Care team care coordination services as a part of their Healthy [facility] Medicaid benefit. Caitlin [name] verbally consented to engagement with the [facility] Managed Care team.   If you are experiencing a medical emergency, please call 911 or report to your local emergency department or urgent care.   If you have a non-emergency medical problem during routine business hours, please contact your provider's office and ask to speak with a nurse.   For questions related to your Healthy [facility] health plan, please call: [phone] or visit the homepage here: [email or web address]  If you would like to schedule transportation through your Healthy [facility] plan, please call the following number at least 2 days in advance of your appointment: [phone]  For information about your ride after you set it up, call Ride Assist at [phone]. Use this number to activate a Will Call pickup, or if your transportation is late for a scheduled pickup. Use this number, too, if you need to make a change or cancel a previously scheduled reservation.  If you need transportation services right away, call [phone]. The after-hours call center is staffed 24 hours to handle ride assistance and urgent reservation requests (including discharges) 365 days a year. Urgent trips include sick visits, hospital discharge requests and life-sustaining treatment.  Call the [facility] Line at [phone], at any time, 24 hours a day, 7 days a week. If you are in danger or need immediate medical attention call 911.  If you would like help to quit smoking, call 1-800-QUIT-NOW (682-199-9802) OR Espaol: 1-855-Djelo-Ya (7-564-332-9518) o para ms informacin haga clic aqu or Text READY to 841-660 to register via text  Ms. Jans - following are the goals we discussed in your visit today:    Goals Addressed   None      The  Patient                                              has been provided with contact information for the Managed Medicaid care management team and has been advised to call with any health related questions or concerns.   Caitlin [name], Caitlin [name], MHA [facility]  Managed Medicaid Social Worker [phone]   Following is a copy of your plan of care:  There are no care plans that you recently modified to display for this patient.

## 2023-07-06 NOTE — Patient Outreach (Signed)
  Medicaid Managed Care Social Work Note  07/06/2023 Name:  Caitlin [name] MRN:  [id number] DOB:  1966/02/11  Caitlin [name] is an 57 y.o. year old female who is a primary patient of Caitlin Hay, DO.  The Medicaid Managed Care Coordination team was consulted for assistance with:  Community Resources   Caitlin [name] was given information about Medicaid Managed Care Coordination team services today. Caitlin [name] Patient agreed to services and verbal consent obtained.  Engaged with patient  for by telephone forfollow up visit in response to referral for case management and/or care coordination services.   Assessments/Interventions:  Review of past medical history, allergies, medications, health status, including review of consultants reports, laboratory and other test data, was performed as part of comprehensive evaluation and provision of chronic care management services.  SDOH: (Social Determinant of Health) assessments and interventions performed: SDOH Interventions    Flowsheet Row Office Visit from 04/22/2023 in [facility]  SDOH Interventions   Food Insecurity Interventions Intervention Not Indicated  Housing Interventions Intervention Not Indicated  Utilities Interventions Intervention Not Indicated  Social Connections Interventions Intervention Not Indicated     BSW completed a telephone outreach with patient, she states she still has not moved in with her daughter yet, they are looking from a 2 bedroom for about $1000 per month. Patient states she did turn in her information for housing on Friday and will be giving them a call this afternoon to make sure they received it. Patient states no other resources are needed at this time, Call was disconnected, BSW made 2 attempts at calling patient back with no answer.  Advanced Directives Status:  Not addressed in this encounter.  Care Plan                 Allergies  Allergen Reactions    Egg-Derived Products Nausea And Vomiting   Milk-Related Compounds     Medications Reviewed Today   Medications were not reviewed in this encounter     Patient Active Problem List   Diagnosis Date Noted   Mixed hyperlipidemia 04/22/2023   Vitamin D deficiency 04/22/2023   Chronic fatigue 04/22/2023   Vision problem 04/22/2023   Moderate persistent asthma without complication 04/22/2023   At risk for obstructive sleep apnea 04/22/2023   Deaf, bilateral 04/22/2023   Viral URI with cough 09/28/2015   Obesity (BMI 30.0-34.9) 11/20/2014   Menopausal symptoms 07/27/2014   Hair loss 05/01/2014   Onychomycosis 05/01/2014   Essential hypertension 03/06/2012   GERD (gastroesophageal reflux disease) 03/06/2012    Conditions to be addressed/monitored per PCP order:   community resources  There are no care plans that you recently modified to display for this patient.   Follow up:  Patient agrees to Care Plan and Follow-up.  Plan: The  Patient has been provided with contact information for the Managed Medicaid care management team and has been advised to call with any health related questions or concerns.    Abelino Derrick, MHA [facility]  Managed [facility] Social Worker [phone]

## 2023-07-08 ENCOUNTER — Telehealth: Payer: Self-pay

## 2023-07-08 NOTE — Telephone Encounter (Signed)
Copied from CRM [id number]. Topic: General - Other >> Jul 08, 2023 10:04 AM Macon Large wrote: Reason for CRM: Tobi Bastos with [facility] requests call back regarding the prior authorization for speech assistance device. Tobi Bastos stated that she is trying to prevent a denial on this code and would like to redirect for another company. Please contact Tobi Bastos at [phone] may leave message/secure voicemail

## 2023-07-10 ENCOUNTER — Ambulatory Visit: Payer: Medicaid Other | Admitting: Cardiovascular Disease

## 2023-07-28 ENCOUNTER — Telehealth: Payer: Self-pay

## 2023-07-28 NOTE — Telephone Encounter (Signed)
Just an FYI   Copied from CRM [id number]. Topic: Referral - Status >> Jul 28, 2023  9:56 AM Shon Hale wrote: Reason for CRM: Received referral to have pt start on speech device. They have attempted to reach out to patient 3 times and unable to reach pt to get process started for speech device. Called and left message for patient on 07/14/23, 07/20/23 and 07/22/2023.

## 2023-08-21 ENCOUNTER — Ambulatory Visit: Payer: Medicaid Other | Admitting: Family Medicine

## 2023-08-27 ENCOUNTER — Ambulatory Visit: Payer: Medicaid Other | Admitting: Family Medicine

## 2023-08-27 VITALS — BP 135/82 | HR 70 | Resp 18 | Ht 65.0 in | Wt 187.7 lb

## 2023-08-27 DIAGNOSIS — L304 Erythema intertrigo: Secondary | ICD-10-CM | POA: Diagnosis not present

## 2023-08-27 DIAGNOSIS — R5382 Chronic fatigue, unspecified: Secondary | ICD-10-CM | POA: Diagnosis not present

## 2023-08-27 DIAGNOSIS — I10 Essential (primary) hypertension: Secondary | ICD-10-CM

## 2023-08-27 DIAGNOSIS — Z713 Dietary counseling and surveillance: Secondary | ICD-10-CM | POA: Diagnosis not present

## 2023-08-27 DIAGNOSIS — E66811 Obesity, class 1: Secondary | ICD-10-CM

## 2023-08-27 DIAGNOSIS — D649 Anemia, unspecified: Secondary | ICD-10-CM

## 2023-08-27 DIAGNOSIS — E782 Mixed hyperlipidemia: Secondary | ICD-10-CM

## 2023-08-27 DIAGNOSIS — E559 Vitamin D deficiency, unspecified: Secondary | ICD-10-CM | POA: Diagnosis not present

## 2023-08-27 DIAGNOSIS — R001 Bradycardia, unspecified: Secondary | ICD-10-CM

## 2023-08-27 MED ORDER — NALTREXONE HCL 50 MG PO TABS: 25.0000 mg | ORAL_TABLET | Freq: Every day | ORAL | 2 refills | Status: DC

## 2023-08-27 MED ORDER — BUPROPION HCL ER (XL) 150 MG PO TB24: 150.0000 mg | ORAL_TABLET | Freq: Every day | ORAL | 0 refills | Status: DC

## 2023-08-27 NOTE — Patient Instructions (Addendum)
 Sleep specialists: Please call [phone] to get on the schedule.    [employer] - speech-generating device for adults  Mail order  [phone]    Increase your intake of fresh/frozen fruits and vegetables.  The Mediterranean diet is a great reference for meal ideas.  I strongly encourage you to incorporate exercise into your daily routine (at least 30 minutes most days of the week) and monitor your dietary intake. - I encourage you to measure/weigh your foods/beverages for a few days to get a more accurate idea of what different amounts of things look like on your plate or in your glass.  - Using smaller plates/glasses also helps in that it tricks our minds into thinking we've eaten more than we have. Studies have shown that we eat more when presented with larger plates, even with the same amount of food on them.   - Plan to eat until you are no longer hungry, rather than until you're full.  If you don't normally exercise, start with something simple or more enjoyable. You can plan to walk for your half hour or do something like dancing if you enjoy it.  Build up your activity over time; this will make it more enjoyable and reduce your risk of injury.  VIDEOS: Here are some videos which offer a variety of different workout classes with different levels: [email or web address]  It is completely free. If a full video is too much for you to do, you can do them in bite-sized chunks (just pausing when needed and resuming later in the day).   Even if these actions don't ultimately lead to weight loss, increasing your activity will still help to reduce your insulin resistance and will improve your cardiovascular health (making heart attack/stroke less likely as you age).

## 2023-08-27 NOTE — Assessment & Plan Note (Addendum)
 Desires weight loss. Reports difficulty with diet and exercise. Discussed weight loss medications and side effects. Chose Wellbutrin  with naltrexone  due to concerns about heart rate and preference against injections. Provided dietary advice and exercise resources. - Prescribe Wellbutrin  (bupropion ) with naltrexone  - Provide dietary advice including high protein meals, avoiding greasy and fried foods, using smaller plates; patient is considering meal replacements like Premier protein shakes - Encourage exercise, including at-home exercises and provide exercise video resources - Schedule follow-up in 2 months to monitor progress

## 2023-08-27 NOTE — Progress Notes (Signed)
 Established patient visit   Patient: Caitlin [name]   DOB: 08-May-1966   58 y.o. Female  MRN: [id number] Visit Date: 08/27/2023  Today's healthcare provider: LAURAINE LOISE BUOY, DO   Chief Complaint  Patient presents with   Hyperlipidemia   Hypertension   Subjective    HPI Sign language interpreter: Lucienne Amble [phone]   The patient, with a history of heart concerns, presents with a sensation of a slow heart rate that began a couple of months ago. She reports that this sensation is particularly noticeable during physical activity, such as housecleaning, leading her to take frequent breaks. The patient also reports a persistent rash under the breasts for the past two months, which she attributes to moisture. She has not been using any powders or other products to manage this issue.  The patient expresses a desire to lose weight and is considering medication to assist with this. She recalls taking a water pill for weight loss in the past, but is open to other options. She acknowledges a habit of eating fried foods and expresses a willingness to make dietary changes. She does not currently engage in regular exercise, citing cold weather as a deterrent, but is open to at-home exercises.  The patient also mentions a scheduled sleep study, which she has yet to arrange due to personal responsibilities. She has not been diagnosed with diabetes but has a history of vitamin D  deficiency. She has received two doses of the COVID-19 vaccine and has declined the shingles vaccine. She has not been screened for Hepatitis C and is not interested in being screened at this time.  The patient also mentions a previous interest in a speech-generating device, but she has  not yet followed up with the company providing the device. She has a dental appointment scheduled in the coming months due to gum issues.     Medications: Outpatient Medications Prior to Visit  Medication Sig   albuterol  (VENTOLIN   HFA) 108 (90 Base) MCG/ACT inhaler Inhale 2 puffs into the lungs every 6 (six) hours as needed for wheezing or shortness of breath.   amLODipine  (NORVASC ) 10 MG tablet TAKE 1 TABLET BY MOUTH ONCE DAILY TO LOWER BLOOD PRESSURE   atorvastatin  (LIPITOR) 10 MG tablet Take 1 tablet by mouth once daily   Blood Pressure Monitor KIT Use to check blood pressure daily   budesonide -formoterol  (SYMBICORT ) 80-4.5 MCG/ACT inhaler Inhale 2 puffs into the lungs in the morning and at bedtime.   carvedilol  (COREG ) 3.125 MG tablet Take 1 tablet (3.125 mg total) by mouth 2 (two) times daily with a meal.   famotidine  (PEPCID ) 20 MG tablet Take 1 tablet (20 mg total) by mouth daily before breakfast.   levocetirizine (XYZAL ) 5 MG tablet Take 1 tablet (5 mg total) by mouth every evening.   Minoxidil  5 % FOAM Apply 1 Application topically 2 (two) times daily.   Misc. Devices MISC CPAP therapy on 17 cm H2O . Patient to use X-Small size Resmed Full Face Mask AirFit F10. Diagnosis - Obstructive sleep apnea   naproxen  (NAPROSYN ) 500 MG tablet Take 1 tablet (500 mg total) by mouth 2 (two) times daily with a meal.   nystatin  (MYCOSTATIN /NYSTOP ) powder Apply 1 application topically 2 (two) times daily. Apply to affected area under breast   Olopatadine  HCl 0.2 % SOLN Apply 1 drop to eye daily as needed (itchy/watery eyes).   traZODone  (DESYREL ) 50 MG tablet Take 0.5-1 tablets (25-50 mg total) by mouth at bedtime  as needed for sleep.   valsartan -hydrochlorothiazide  (DIOVAN -HCT) 320-25 MG tablet Take 1 tablet by mouth once daily   omeprazole  (PRILOSEC  OTC) 20 MG tablet Take 2 tablets (40 mg total) by mouth daily for 14 days.   No facility-administered medications prior to visit.        Objective    BP 135/82   Pulse 70   Resp 18   Ht 5' 5 (1.651 m)   Wt 187 lb 11.2 oz (85.1 kg)   LMP 05/08/2016   SpO2 100%   BMI 31.23 kg/m     Physical Exam Vitals and nursing note reviewed.  Constitutional:      General: She is  not in acute distress.    Appearance: Normal appearance.  HENT:     Head: Normocephalic and atraumatic.  Eyes:     General: No scleral icterus.    Conjunctiva/sclera: Conjunctivae normal.  Cardiovascular:     Rate and Rhythm: Normal rate.  Pulmonary:     Effort: Pulmonary effort is normal.  Skin:    Comments: Darkened moist area under breast bilaterally without evidence of erythema or skin breakdown  Neurological:     Mental Status: She is alert and oriented to person, place, and time. Mental status is at baseline.  Psychiatric:        Mood and Affect: Mood normal.        Behavior: Behavior normal.      Results for orders placed or performed in visit on 08/27/23  Comprehensive metabolic panel  Result Value Ref Range   Glucose 118 (H) 70 - 99 mg/dL   BUN 15 6 - 24 mg/dL   Creatinine, Ser 9.06 0.57 - 1.00 mg/dL   eGFR 72 >40 fO/fpw/8.26   BUN/Creatinine Ratio 16 9 - 23   Sodium 139 134 - 144 mmol/L   Potassium 3.9 3.5 - 5.2 mmol/L   Chloride 100 96 - 106 mmol/L   CO2 24 20 - 29 mmol/L   Calcium  9.6 8.7 - 10.2 mg/dL   Total Protein 7.6 6.0 - 8.5 g/dL   Albumin 4.3 3.8 - 4.9 g/dL   Globulin, Total 3.3 1.5 - 4.5 g/dL   Bilirubin Total 0.3 0.0 - 1.2 mg/dL   Alkaline Phosphatase 87 44 - 121 IU/L   AST 19 0 - 40 IU/L   ALT 22 0 - 32 IU/L  Lipid panel  Result Value Ref Range   Cholesterol, Total 198 100 - 199 mg/dL   Triglycerides 84 0 - 149 mg/dL   HDL 40 >60 mg/dL   VLDL Cholesterol Cal 15 5 - 40 mg/dL   LDL Chol Calc (NIH) 856 (H) 0 - 99 mg/dL   Chol/HDL Ratio 5.0 (H) 0.0 - 4.4 ratio  VITAMIN D  25 Hydroxy (Vit-D Deficiency, Fractures)  Result Value Ref Range   Vit D, 25-Hydroxy 14.6 (L) 30.0 - 100.0 ng/mL  CBC  Result Value Ref Range   WBC 5.1 3.4 - 10.8 x10E3/uL   RBC 3.96 3.77 - 5.28 x10E6/uL   Hemoglobin 11.6 11.1 - 15.9 g/dL   Hematocrit 64.4 65.9 - 46.6 %   MCV 90 79 - 97 fL   MCH 29.3 26.6 - 33.0 pg   MCHC 32.7 31.5 - 35.7 g/dL   RDW 88.5 (L) 88.2 - 84.5 %    Platelets 419 150 - 450 x10E3/uL  Iron, TIBC and Ferritin Panel  Result Value Ref Range   Total Iron Binding Capacity 312 250 - 450 ug/dL   UIBC 765  131 - 425 ug/dL   Iron 78 27 - 840 ug/dL   Iron Saturation 25 15 - 55 %   Ferritin 293 (H) 15 - 150 ng/mL    Assessment & Plan    Essential hypertension Assessment & Plan: Stable.  - continue amlodipine  10 mg daily - continue valsartan -hydrochlorothiazide  320-25 mg daily  Orders: -     Comprehensive metabolic panel  Weight loss counseling, encounter for Assessment & Plan: Desires weight loss. Reports difficulty with diet and exercise. Discussed weight loss medications and side effects. Chose Wellbutrin  with naltrexone  due to concerns about heart rate and preference against injections. Provided dietary advice and exercise resources. - Prescribe Wellbutrin  (bupropion ) with naltrexone  - Provide dietary advice including high protein meals, avoiding greasy and fried foods, using smaller plates; patient is considering meal replacements like Premier protein shakes - Encourage exercise, including at-home exercises and provide exercise video resources - Schedule follow-up in 2 months to monitor progress  Orders: -     buPROPion  HCl ER (XL); Take 1 tablet (150 mg total) by mouth daily. Take 1 tablet daily for 3 days, then take 2 tablets daily  Dispense: 59 tablet; Refill: 0 -     Naltrexone  HCl; Take 0.5 tablets (25 mg total) by mouth daily.  Dispense: 15 tablet; Refill: 2  Obesity (BMI 30.0-34.9) Assessment & Plan: Counseled regarding weight loss as noted.  Orders: -     buPROPion  HCl ER (XL); Take 1 tablet (150 mg total) by mouth daily. Take 1 tablet daily for 3 days, then take 2 tablets daily  Dispense: 59 tablet; Refill: 0 -     Naltrexone  HCl; Take 0.5 tablets (25 mg total) by mouth daily.  Dispense: 15 tablet; Refill: 2  Bradycardia Assessment & Plan: Reports feeling slow heart rate during activity. Heart rate today is 70 bpm,  within normal range. No history of heart problems. Cardiologist appointment scheduled for further evaluation. Discussed home monitoring with a pulse oximeter. Advised that 60-100 bpm is normal. - Recommend purchasing a pulse oximeter for home monitoring - Adjust antihypertensive medications if necessary after cardiology evaluation - Follow up with cardiology as scheduled   Vitamin D  deficiency Assessment & Plan: Previously diagnosed with vitamin D  deficiency. Will recheck vitamin D  level. - Order blood work to check vitamin D  levels  Orders: -     VITAMIN D  25 Hydroxy (Vit-D Deficiency, Fractures)  Mixed hyperlipidemia Assessment & Plan: Will order fasting lipid panel; patient to return to clinic to have it drawn.  - continue atorvastatin  10 mg daily.  Orders: -     Lipid panel  Normocytic anemia -     CBC -     Iron, TIBC and Ferritin Panel  Chronic fatigue Assessment & Plan: Will evaluate for metabolic source with blood work as noted below.  Orders: -     CBC -     Iron, TIBC and Ferritin Panel  Intertrigo Assessment & Plan: Under breast bilaterally x 2 months. No evidence of infection on exam.  Advised patient to keep the area clean and dry as much as possible, wear breathable clothing and apply baby powder and/or skin barrier creams such as Desitin.   General Health Maintenance Declined shingles vaccine despite discussion of risks and benefits. Declined hepatitis C screening. Up to date on COVID-19 vaccinations. - Discuss shingles vaccine at future visits - Discontinue hepatitis C screening prompt unless requested  Follow-up - Follow up in 2 months for weight loss progress and general health -  Call sleep specialist to schedule sleep study - Call speech generating device company for follow-up.  Return in about 2 months (around 10/25/2023).      I discussed the assessment and treatment plan with the patient  The patient was provided an opportunity to ask questions  and all were answered. The patient agreed with the plan and demonstrated an understanding of the instructions.   The patient was advised to call back or seek an in-person evaluation if the symptoms worsen or if the condition fails to improve as anticipated.  Total time was 45 minutes. That includes chart review before the visit, the actual patient visit, and time spent on documentation after the visit.    LAURAINE LOISE BUOY, DO  [facility] [phone] (phone) [phone] (fax)  [facility]

## 2023-08-31 DIAGNOSIS — R5382 Chronic fatigue, unspecified: Secondary | ICD-10-CM | POA: Diagnosis not present

## 2023-08-31 DIAGNOSIS — I10 Essential (primary) hypertension: Secondary | ICD-10-CM | POA: Diagnosis not present

## 2023-08-31 DIAGNOSIS — E559 Vitamin D deficiency, unspecified: Secondary | ICD-10-CM | POA: Diagnosis not present

## 2023-08-31 DIAGNOSIS — E782 Mixed hyperlipidemia: Secondary | ICD-10-CM | POA: Diagnosis not present

## 2023-08-31 DIAGNOSIS — D649 Anemia, unspecified: Secondary | ICD-10-CM | POA: Diagnosis not present

## 2023-09-01 DIAGNOSIS — L304 Erythema intertrigo: Secondary | ICD-10-CM | POA: Insufficient documentation

## 2023-09-01 LAB — IRON,TIBC AND FERRITIN PANEL
Ferritin: 293 ng/mL — ABNORMAL HIGH (ref 15–150)
Iron Saturation: 25 % (ref 15–55)
Iron: 78 ug/dL (ref 27–159)
Total Iron Binding Capacity: 312 ug/dL (ref 250–450)
UIBC: 234 ug/dL (ref 131–425)

## 2023-09-01 LAB — COMPREHENSIVE METABOLIC PANEL
ALT: 22 [IU]/L (ref 0–32)
AST: 19 [IU]/L (ref 0–40)
Albumin: 4.3 g/dL (ref 3.8–4.9)
Alkaline Phosphatase: 87 [IU]/L (ref 44–121)
BUN/Creatinine Ratio: 16 (ref 9–23)
BUN: 15 mg/dL (ref 6–24)
Bilirubin Total: 0.3 mg/dL (ref 0.0–1.2)
CO2: 24 mmol/L (ref 20–29)
Calcium: 9.6 mg/dL (ref 8.7–10.2)
Chloride: 100 mmol/L (ref 96–106)
Creatinine, Ser: 0.93 mg/dL (ref 0.57–1.00)
Globulin, Total: 3.3 g/dL (ref 1.5–4.5)
Glucose: 118 mg/dL — ABNORMAL HIGH (ref 70–99)
Potassium: 3.9 mmol/L (ref 3.5–5.2)
Sodium: 139 mmol/L (ref 134–144)
Total Protein: 7.6 g/dL (ref 6.0–8.5)
eGFR: 72 mL/min/{1.73_m2} (ref >59)

## 2023-09-01 LAB — VITAMIN D 25 HYDROXY (VIT D DEFICIENCY, FRACTURES): Vit D, 25-Hydroxy: 14.6 ng/mL — ABNORMAL LOW (ref 30.0–100.0)

## 2023-09-01 LAB — LIPID PANEL
Chol/HDL Ratio: 5 {ratio} — ABNORMAL HIGH (ref 0.0–4.4)
Cholesterol, Total: 198 mg/dL (ref 100–199)
HDL: 40 mg/dL (ref >39)
LDL Chol Calc (NIH): 143 mg/dL — ABNORMAL HIGH (ref 0–99)
Triglycerides: 84 mg/dL (ref 0–149)
VLDL Cholesterol Cal: 15 mg/dL (ref 5–40)

## 2023-09-01 LAB — CBC
Hematocrit: 35.5 % (ref 34.0–46.6)
Hemoglobin: 11.6 g/dL (ref 11.1–15.9)
MCH: 29.3 pg (ref 26.6–33.0)
MCHC: 32.7 g/dL (ref 31.5–35.7)
MCV: 90 fL (ref 79–97)
Platelets: 419 10*3/uL (ref 150–450)
RBC: 3.96 x10E6/uL (ref 3.77–5.28)
RDW: 11.4 % — ABNORMAL LOW (ref 11.7–15.4)
WBC: 5.1 10*3/uL (ref 3.4–10.8)

## 2023-09-01 NOTE — Assessment & Plan Note (Addendum)
 Under breast bilaterally x 2 months. No evidence of infection on exam.  Advised patient to keep the area clean and dry as much as possible, wear breathable clothing and apply baby powder and/or skin barrier creams such as Desitin.

## 2023-09-05 ENCOUNTER — Encounter: Payer: Self-pay | Admitting: Family Medicine

## 2023-09-05 DIAGNOSIS — R001 Bradycardia, unspecified: Secondary | ICD-10-CM | POA: Insufficient documentation

## 2023-09-05 DIAGNOSIS — D649 Anemia, unspecified: Secondary | ICD-10-CM | POA: Insufficient documentation

## 2023-09-05 MED ORDER — VITAMIN D (ERGOCALCIFEROL) 1.25 MG (50000 UNIT) PO CAPS: 50000.0000 [IU] | ORAL_CAPSULE | ORAL | 1 refills | Status: AC

## 2023-09-05 NOTE — Assessment & Plan Note (Signed)
Previously diagnosed with vitamin D deficiency. Will recheck vitamin D level. - Order blood work to check vitamin D levels

## 2023-09-05 NOTE — Assessment & Plan Note (Signed)
History of anemia noted. Will recheck levels today.

## 2023-09-05 NOTE — Assessment & Plan Note (Signed)
Reports feeling slow heart rate during activity. Heart rate today is 70 bpm, within normal range. No history of heart problems. Cardiologist appointment scheduled for further evaluation. Discussed home monitoring with a pulse oximeter. Advised that 60-100 bpm is normal. - Recommend purchasing a pulse oximeter for home monitoring - Adjust antihypertensive medications if necessary after cardiology evaluation - Follow up with cardiology as scheduled

## 2023-09-05 NOTE — Assessment & Plan Note (Signed)
Will order fasting lipid panel; patient to return to clinic to have it drawn.  - continue atorvastatin 10 mg daily.

## 2023-09-05 NOTE — Assessment & Plan Note (Signed)
Will evaluate for metabolic source with blood work as noted below.

## 2023-09-05 NOTE — Assessment & Plan Note (Signed)
Stable.  - continue amlodipine 10 mg daily - continue valsartan-hydrochlorothiazide 320-25 mg daily

## 2023-09-05 NOTE — Assessment & Plan Note (Signed)
Counseled regarding weight loss as noted.

## 2023-09-23 ENCOUNTER — Encounter: Payer: Self-pay | Admitting: Cardiology

## 2023-09-23 ENCOUNTER — Ambulatory Visit: Payer: Medicaid Other | Attending: Cardiology | Admitting: Cardiology

## 2023-09-23 ENCOUNTER — Ambulatory Visit: Payer: Medicaid Other

## 2023-09-23 VITALS — BP 144/72 | HR 62 | Ht 64.0 in | Wt 187.8 lb

## 2023-09-23 DIAGNOSIS — R002 Palpitations: Secondary | ICD-10-CM

## 2023-09-23 DIAGNOSIS — I10 Essential (primary) hypertension: Secondary | ICD-10-CM

## 2023-09-23 DIAGNOSIS — R072 Precordial pain: Secondary | ICD-10-CM | POA: Diagnosis not present

## 2023-09-23 DIAGNOSIS — E7800 Pure hypercholesterolemia, unspecified: Secondary | ICD-10-CM

## 2023-09-23 NOTE — Patient Instructions (Signed)
Medication Instructions:  - Your physician recommends that you continue on your current medications as directed. Please refer to the Current Medication list given to you today.  *If you need a refill on your cardiac medications before your next appointment, please call your pharmacy*   Lab Work: - none ordered  If you have labs (blood work) drawn today and your tests are completely normal, you will receive your results only by: MyChart Message (if you have MyChart) OR A paper copy in the mail If you have any lab test that is abnormal or we need to change your treatment, we will call you to review the results.   Testing/Procedures:  1) Echocardiogram: - Your physician has requested that you have an echocardiogram. Echocardiography is a painless test that uses sound waves to create images of your heart. It provides your doctor with information about the size and shape of your heart and how well your heart's chambers and valves are working. This procedure takes approximately one hour. There are no restrictions for this procedure. Please do NOT wear cologne, perfume, aftershave, or lotions (deodorant is allowed). Please arrive 15 minutes prior to your appointment time.  There is a possibility that an IV may need to be started during your test to inject an image enhancing agent. This is done to obtain more optimal pictures of your heart. Therefore we ask that you do at least drink some water prior to coming in to hydrate your veins.   Please note: We ask at that you not bring children with you during ultrasound (echo/ vascular) testing. Due to room size and safety concerns, children are not allowed in the ultrasound rooms during exams. Our front office staff cannot provide observation of children in our lobby area while testing is being conducted. An adult accompanying a patient to their appointment will only be allowed in the ultrasound room at the discretion of the ultrasound technician under  special circumstances. We apologize for any inconvenience.   2) Heart Monitor:  Your physician has requested you wear a ZIO XT (heart) monitor for 14 days.  Your monitor will be mailed to your home address within 3-5 business days. This is sent via Fed Ex from [employer]. However, if you have not received your monitor after 5 business days please send [place] a MyChart message or call the office at [phone], so we may follow up on this for you.   This monitor is a medical device (single patch monitor) that records the heart's electrical activity. Doctors most often use these monitors to diagnose arrhythmias. Arrhythmias are problems with the speed or rhythm of the heartbeat.   [employer] supplies 1 patch per enrollment. Additional stickers are not available.  Please DO NOT apply the patch if you will be having a Nuclear Stress Test, Echocardiogram, Cardiac CT, Cardiac MRI, Chest X-ray during the period you would be wearing the monitor. The patch cannot be worn during these tests.  You cannot remove and re-apply the ZIO patch monitor.   Applying the Monitor: Once you receive your monitor, this will include a small razor, abrader, and 4 alcohol pads. Shave hair from upper left chest Rub abrader disc in 40 strokes over the left upper chest as indicated in your monitor instructions Clean area with 4 enclosed alcohol pads (there may be a mild & brief stinging sensation over the newly abraded area, but this is normal). Let dry Apply patch as indicated in monitor instructions. Patch will be placed under collarbone on  the left side of the chest with arrow pointing upward. Rub adhesive wings for 2 minutes. Remove white label marked "1". Remove the white label marked "2". Rub patch adhesive wings for an 2 minutes.  While looking in a mirror, press and release button in the center of the patch. You may hear a "click". A small green light will flash 4-6 times and then stop. This will be your  indicator that the monitor has been turned on.  Wearing the Monitor: Avoid showering during the first 24 hours of wearing the monitor.  After 24 hours you may shower with the patch on. Take brief showers with your back facing the shower head.  Avoid excessive sweating to help maximize wear time. Do not submerge the device, no hot tubs, and no swimming pools. Keep any lotions or oils away from the patch. Press the button if you feel a symptom. You will hear a small click. Record date, time, and symptoms in the Patient Logbook or App.  Monitor Issues: Call [employer] Customer Care at [phone] if you have questions regarding your Zio Patch Monitor. Call them immediately if you see an orange/ amber colored light blinking on your monitor. If your monitor falls off and you cannot get this reapplied or if you need suggestions for securing your monitor call [employer] at [phone].   Returning the Monitor: Once you have completed wearing your monitor, follow instructions on the last 2 pages of the Patient Logbook. Stick monitor patch on to the last page of the Patient Logbook.  Place Patient Logbook with monitor in the return box provided. Use locking tab on box and tape box closed securely. The return box has pre-paid postage on it.  Place the return box in the regular [place] Mail box as soon as possible It will take anywhere from 1-2 weeks for your provider to receive and review your results once you mail this back. If for some reason you have misplaced your return box then call our office and we can provide another box and/or mail it off for you.   Billing  and Patient Assistance Program Information: We have supplied [employer] with any of your insurance information on file for billing purposes. [employer] offers a sliding scale Patient Assistance Program for patients that do not have insurance, or whose insurance does not completely cover the cost of the ZIO monitor. You must apply for  the Patient Assistance Program to qualify for this discounted rate. To apply, please call [employer] at [phone], select option 1, ask to apply for the Patient Assistance Program. [employer] will ask your household income, and how many people are in your household. They will quote your out-of-pocket cost based on that information. [employer] will also be able to set up for a 5-month, interest-free payment plan if needed.    Follow-Up: At [facility], you and your health needs are our priority.  As part of our continuing mission to provide you with exceptional heart care, we have created designated Provider Care Teams.  These Care Teams include your primary Cardiologist (physician) and Advanced Practice Providers (APPs -  Physician Assistants and Nurse Practitioners) who all work together to provide you with the care you need, when you need it.  We recommend signing up for the patient portal called "MyChart".  Sign up information is provided on this After Visit Summary.  MyChart is used to connect with patients for Virtual Visits (Telemedicine).  Patients are able to view lab/test results, encounter notes, upcoming appointments,  etc.  Non-urgent messages can be sent to your provider as well.   To learn more about what you can do with MyChart, go to [email or web address].    Your next appointment:   3 month(s)  Provider:   You may see Debbe Odea, MD  or one of the following Advanced Practice Providers on your designated Care Team:   Nicolasa Ducking, NP Eula Listen, PA-C Cadence Fransico Michael, PA-C Charlsie Quest, NP Carlos Levering, NP    Other Instructions Echocardiogram An echocardiogram is a test that uses sound waves to make images of your heart. This way of making images is often called ultrasound. The images from this test can help find out many things about your heart, including: The size and shape of your heart. The strength of your heart muscle and how well it's  working. The size, thickness, and movement of your heart's walls. How your heart valves are working. Problems such as: A tumor or a growth from an infection around the heart valves. Areas of heart muscle that aren't working well because of poor blood flow or injury from a heart attack. An aneurysm. This is a weak or damaged part of an artery wall. An artery is a blood vessel. Tell a health care provider about: Any allergies you have. All medicines you're taking, including vitamins, herbs, eye drops, creams, and over-the-counter medicines. Any bleeding problems you have. Any surgeries you've had. Any medical problems you have. Whether you're pregnant or may be pregnant. What are the risks? Your health care provider will talk with you about risks. These may include an allergic reaction to IV dye that may be used during the test. What happens before the test? You don't need to do anything to get ready for this test. You may eat and drink normally. What happens during the test?  You'll take off your clothes from the waist up and put on a hospital gown. Sticky patches called electrodes may be placed on your chest. These will be connected to a machine that monitors your heart rate and rhythm. You'll lie down on a table for the exam. A wand covered in gel will be moved over your chest. Sound waves from the wand will go to your heart and bounce back--or "echo" back. The sound waves will go to a computer that uses them to make images of your heart. The images can be viewed on a monitor. The images will also be recorded on the computer so your provider can look at them later. You may be asked to change positions or hold your breath for a short time. This makes it easier to get different views or better views of your heart. In some cases, you may be given a dye through an IV. The IV is put into one of your veins. This dye can make the areas of your heart easier to see. The procedure may vary among  providers and hospitals. What can I expect after the test? You may return to your normal diet, activities, and medicines unless your provider tells you not to. If an IV was placed for the test, it will be removed. It's up to you to get the results of your test. Ask your provider, or the department that's doing the test, when your results will be ready. This information is not intended to replace advice given to you by your health care provider. Make sure you discuss any questions you have with your health care provider. Document Revised: 10/03/2022 Document Reviewed: 10/03/2022 Elsevier Patient  Education  2024 [employer].

## 2023-09-23 NOTE — Progress Notes (Signed)
 Cardiology Office Note:    Date:  09/23/2023   ID:  Caitlin [name], DOB 10/29/1965, MRN [id number]  PCP:  Donzella Lauraine SAILOR, DO   [facility] Providers Cardiologist:  None     Referring MD: Donzella Lauraine SAILOR, DO   Chief Complaint  Patient presents with   New Patient (Initial Visit)    Referred for cardiology evaluation of Dizziness and Palpitations with no cardiac history.  Patient reports intermittent feeling of heart beating funny that last about 15-20 minutes with last episode last week.       History of Present Illness:    Alphia [name] is a 58 y.o. female with a hx of hypertension, hyperlipidemia, asthma, deafness presenting with dizziness and palpitations.  Sign interpreter used for this visit due to patient's condition/deafness.  She states having symptoms of palpitations over the past 3 months.  Symptoms occur about 3 times weekly lasting a few minutes.  Occasionally has dizziness and chest pain when she has palpitations.  Was on cholesterol medicines, stopped taking over the past year, recent blood work showed elevated cholesterol level.  Restarted Lipitor 10 mg daily about 2 weeks ago.  Denies smoking, but states being exposed to secondhand smoke.  Denies any history of heart disease.  Past Medical History:  Diagnosis Date   Asthma    Deaf    GERD (gastroesophageal reflux disease)    Hypertension 2011   NO (nasal obstruction)    OSA (obstructive sleep apnea)     Past Surgical History:  Procedure Laterality Date   CESAREAN SECTION     CHOLECYSTECTOMY     TUBAL LIGATION      Current Medications: Current Meds  Medication Sig   albuterol  (VENTOLIN  HFA) 108 (90 Base) MCG/ACT inhaler Inhale 2 puffs into the lungs every 6 (six) hours as needed for wheezing or shortness of breath.   amLODipine  (NORVASC ) 10 MG tablet TAKE 1 TABLET BY MOUTH ONCE DAILY TO LOWER BLOOD PRESSURE   atorvastatin  (LIPITOR) 10 MG tablet Take 1 tablet by mouth once daily    Blood Pressure Monitor KIT Use to check blood pressure daily   budesonide -formoterol  (SYMBICORT ) 80-4.5 MCG/ACT inhaler Inhale 2 puffs into the lungs in the morning and at bedtime.   carvedilol  (COREG ) 3.125 MG tablet Take 1 tablet (3.125 mg total) by mouth 2 (two) times daily with a meal.   famotidine  (PEPCID ) 20 MG tablet Take 1 tablet (20 mg total) by mouth daily before breakfast.   valsartan -hydrochlorothiazide  (DIOVAN -HCT) 320-25 MG tablet Take 1 tablet by mouth once daily   Vitamin D , Ergocalciferol , (DRISDOL ) 1.25 MG (50000 UNIT) CAPS capsule Take 1 capsule (50,000 Units total) by mouth every 7 (seven) days.     Allergies:   Egg-derived products and Milk-related compounds   Social History   Socioeconomic History   Marital status: Legally Separated    Spouse name: Not on file   Number of children: Not on file   Years of education: Not on file   Highest education level: Not on file  Occupational History    Employer: UNEMPLOYED  Tobacco Use   Smoking status: Never   Smokeless tobacco: Never  Vaping Use   Vaping status: Never Used  Substance and Sexual Activity   Alcohol use: Yes    Alcohol/week: 1.0 standard drink of alcohol    Types: 1 Glasses of wine per week    Comment: occassionally    Drug use: Not Currently    Types: Crack cocaine  Sexual activity: Yes    Birth control/protection: Surgical  Other Topics Concern   Not on file  Social History Narrative   Not on file   Social Drivers of Health   Financial Resource Strain: Not on file  Food Insecurity: No Food Insecurity (04/22/2023)   Hunger Vital Sign    Worried About Running Out of Food in the Last Year: Never true    Ran Out of Food in the Last Year: Never true  Transportation Needs: Unmet Transportation Needs (04/22/2023)   PRAPARE - Administrator, Civil Service (Medical): Yes    Lack of Transportation (Non-Medical): Yes  Physical Activity: Unknown (04/22/2023)   Exercise Vital Sign    Days of  Exercise per Week: 0 days    Minutes of Exercise per Session: Not on file  Stress: Not on file  Social Connections: Moderately Isolated (04/22/2023)   Social Connection and Isolation Panel [NHANES]    Frequency of Communication with Friends and Family: More than three times a week    Frequency of Social Gatherings with Friends and Family: Once a week    Attends Religious Services: Never    Database Administrator or Organizations: Yes    Attends Engineer, Structural: More than 4 times per year    Marital Status: Separated     Family History: The patient's family history includes Heart disease in her mother; Stroke in her mother. There is no history of Breast cancer.  ROS:   Please see the history of present illness.     All other systems reviewed and are negative.  EKGs/Labs/Other Studies Reviewed:    The following studies were reviewed today:  EKG Interpretation Date/Time:  Wednesday September 23 2023 08:34:53 EST Ventricular Rate:  62 PR Interval:  176 QRS Duration:  92 QT Interval:  426 QTC Calculation: 432 R Axis:   18  Text Interpretation: Normal sinus rhythm with sinus arrhythmia Anteroseptal infarct Confirmed by Darliss Rogue ([id number]) on 09/23/2023 8:42:41 AM    Recent Labs: 08/31/2023: ALT 22; BUN 15; Creatinine, Ser 0.93; Hemoglobin 11.6; Platelets 419; Potassium 3.9; Sodium 139  Recent Lipid Panel    Component Value Date/Time   CHOL 198 08/31/2023 1024   TRIG 84 08/31/2023 1024   HDL 40 08/31/2023 1024   CHOLHDL 5.0 (H) 08/31/2023 1024   CHOLHDL 4.0 03/06/2012 0542   VLDL 7 03/06/2012 0542   LDLCALC 143 (H) 08/31/2023 1024     Risk Assessment/Calculations:        Physical Exam:    VS:  BP (!) 144/72 (BP Location: Left Arm, Patient Position: Sitting, Cuff Size: Large)   Pulse 62   Ht 5' 4 (1.626 m)   Wt 187 lb 12.8 oz (85.2 kg)   LMP 05/08/2016   SpO2 98%   BMI 32.24 kg/m     Wt Readings from Last 3 Encounters:  09/23/23 187 lb 12.8 oz  (85.2 kg)  08/27/23 187 lb 11.2 oz (85.1 kg)  06/08/23 181 lb (82.1 kg)     GEN:  Well nourished, well developed in no acute distress HEENT: Normal NECK: No JVD; No carotid bruits CARDIAC: RRR, no murmurs, rubs, gallops RESPIRATORY:  Clear to auscultation without rales, wheezing or rhonchi  ABDOMEN: Soft, non-tender, non-distended MUSCULOSKELETAL:  No edema; No deformity  SKIN: Warm and dry NEUROLOGIC:  Alert and oriented x 3 PSYCHIATRIC:  Normal affect   ASSESSMENT:    1. Palpitations   2. Precordial pain   3. Primary  hypertension   4. Pure hypercholesterolemia    PLAN:    In order of problems listed above:  Palpitations, place cardiac monitor to evaluate any significant arrhythmias. Chest pain, coinciding with palpitations, not associated with exertion.  Not consistent with angina.  Obtain echo to rule out any structural abnormalities. Hypertension, BP elevated today, usually controlled.  Advised to check BP at home and keep log.  Continue Diovan  HCT 320-25 mg daily, Norvasc  10 mg daily, Coreg  3.125 mg twice daily. Hyperlipidemia, agree with restarting Lipitor.  Continue Lipitor 10 mg daily.   Follow-up after cardiac monitor      Medication Adjustments/Labs and Tests Ordered: Current medicines are reviewed at length with the patient today.  Concerns regarding medicines are outlined above.  Orders Placed This Encounter  Procedures   LONG TERM MONITOR (3-14 DAYS)   EKG 12-Lead   ECHOCARDIOGRAM COMPLETE   No orders of the defined types were placed in this encounter.   Patient Instructions  Medication Instructions:  - Your physician recommends that you continue on your current medications as directed. Please refer to the Current Medication list given to you today.  *If you need a refill on your cardiac medications before your next appointment, please call your pharmacy*   Lab Work: - none ordered  If you have labs (blood work) drawn today and your tests are  completely normal, you will receive your results only by: MyChart Message (if you have MyChart) OR A paper copy in the mail If you have any lab test that is abnormal or we need to change your treatment, we will call you to review the results.   Testing/Procedures:  1) Echocardiogram: - Your physician has requested that you have an echocardiogram. Echocardiography is a painless test that uses sound waves to create images of your heart. It provides your doctor with information about the size and shape of your heart and how well your heart's chambers and valves are working. This procedure takes approximately one hour. There are no restrictions for this procedure. Please do NOT wear cologne, perfume, aftershave, or lotions (deodorant is allowed). Please arrive 15 minutes prior to your appointment time.  There is a possibility that an IV may need to be started during your test to inject an image enhancing agent. This is done to obtain more optimal pictures of your heart. Therefore we ask that you do at least drink some water prior to coming in to hydrate your veins.   Please note: We ask at that you not bring children with you during ultrasound (echo/ vascular) testing. Due to room size and safety concerns, children are not allowed in the ultrasound rooms during exams. Our front office staff cannot provide observation of children in our lobby area while testing is being conducted. An adult accompanying a patient to their appointment will only be allowed in the ultrasound room at the discretion of the ultrasound technician under special circumstances. We apologize for any inconvenience.   2) Heart Monitor:  Your physician has requested you wear a ZIO XT (heart) monitor for 14 days.  Your monitor will be mailed to your home address within 3-5 business days. This is sent via Fed Ex from [employer]. However, if you have not received your monitor after 5 business days please send us  a MyChart  message or call the office at [phone], so we may follow up on this for you.   This monitor is a medical device (single patch monitor) that records the heart's electrical  activity. Doctors most often use these monitors to diagnose arrhythmias. Arrhythmias are problems with the speed or rhythm of the heartbeat.   [employer] supplies 1 patch per enrollment. Additional stickers are not available.  Please DO NOT apply the patch if you will be having a Nuclear Stress Test, Echocardiogram, Cardiac CT, Cardiac MRI, Chest X-ray during the period you would be wearing the monitor. The patch cannot be worn during these tests.  You cannot remove and re-apply the ZIO patch monitor.   Applying the Monitor: Once you receive your monitor, this will include a small razor, abrader, and 4 alcohol pads. Shave hair from upper left chest Rub abrader disc in 40 strokes over the left upper chest as indicated in your monitor instructions Clean area with 4 enclosed alcohol pads (there may be a mild & brief stinging sensation over the newly abraded area, but this is normal). Let dry Apply patch as indicated in monitor instructions. Patch will be placed under collarbone on the left side of the chest with arrow pointing upward. Rub adhesive wings for 2 minutes. Remove white label marked 1. Remove the white label marked 2. Rub patch adhesive wings for an 2 minutes.  While looking in a mirror, press and release button in the center of the patch. You may hear a click. A small green light will flash 4-6 times and then stop. This will be your indicator that the monitor has been turned on.  Wearing the Monitor: Avoid showering during the first 24 hours of wearing the monitor.  After 24 hours you may shower with the patch on. Take brief showers with your back facing the shower head.  Avoid excessive sweating to help maximize wear time. Do not submerge the device, no hot tubs, and no swimming pools. Keep any lotions or  oils away from the patch. Press the button if you feel a symptom. You will hear a small click. Record date, time, and symptoms in the Patient Logbook or App.  Monitor Issues: Call [employer] Customer Care at [phone] if you have questions regarding your Zio Patch Monitor. Call them immediately if you see an orange/ amber colored light blinking on your monitor. If your monitor falls off and you cannot get this reapplied or if you need suggestions for securing your monitor call [employer] at [phone].   Returning the Monitor: Once you have completed wearing your monitor, follow instructions on the last 2 pages of the Patient Logbook. Stick monitor patch on to the last page of the Patient Logbook.  Place Patient Logbook with monitor in the return box provided. Use locking tab on box and tape box closed securely. The return box has pre-paid postage on it.  Place the return box in the regular [place]  Mail box as soon as possible It will take anywhere from 1-2 weeks for your provider to receive and review your results once you mail this back. If for some reason you have misplaced your return box then call our office and we can provide another box and/or mail it off for you.   Billing  and Patient Assistance Program Information: We have supplied [employer] with any of your insurance information on file for billing purposes. [employer] offers a sliding scale Patient Assistance Program for patients that do not have insurance, or whose insurance does not completely cover the cost of the ZIO monitor. You must apply for the Patient Assistance Program to qualify for this discounted rate. To apply, please call [employer] at [phone], select option 1,  ask to apply for the Patient Assistance Program. [employer] will ask your household income, and how many people are in your household. They will quote your out-of-pocket cost based on that information. [employer] will also be able to set up for a 46-month,  interest-free payment plan if needed.    Follow-Up: At [facility], you and your health needs are our priority.  As part of our continuing mission to provide you with exceptional heart care, we have created designated Provider Care Teams.  These Care Teams include your primary Cardiologist (physician) and Advanced Practice Providers (APPs -  Physician Assistants and Nurse Practitioners) who all work together to provide you with the care you need, when you need it.  We recommend signing up for the patient portal called MyChart.  Sign up information is provided on this After Visit Summary.  MyChart is used to connect with patients for Virtual Visits (Telemedicine).  Patients are able to view lab/test results, encounter notes, upcoming appointments, etc.  Non-urgent messages can be sent to your provider as well.   To learn more about what you can do with MyChart, go to [email or web address].    Your next appointment:   3 month(s)  Provider:   You may see Redell Cave, MD  or one of the following Advanced Practice Providers on your designated Care Team:   Lonni Meager, NP Bernardino Bring, PA-C Cadence Franchester, PA-C Tylene Lunch, NP Barnie Hila, NP    Other Instructions Echocardiogram An echocardiogram is a test that uses sound waves to make images of your heart. This way of making images is often called ultrasound. The images from this test can help find out many things about your heart, including: The size and shape of your heart. The strength of your heart muscle and how well it's working. The size, thickness, and movement of your heart's walls. How your heart valves are working. Problems such as: A tumor or a growth from an infection around the heart valves. Areas of heart muscle that aren't working well because of poor blood flow or injury from a heart attack. An aneurysm. This is a weak or damaged part of an artery wall. An artery is a blood vessel. Tell a  health care provider about: Any allergies you have. All medicines you're taking, including vitamins, herbs, eye drops, creams, and over-the-counter medicines. Any bleeding problems you have. Any surgeries you've had. Any medical problems you have. Whether you're pregnant or may be pregnant. What are the risks? Your health care provider will talk with you about risks. These may include an allergic reaction to IV dye that may be used during the test. What happens before the test? You don't need to do anything to get ready for this test. You may eat and drink normally. What happens during the test?  You'll take off your clothes from the waist up and put on a hospital gown. Sticky patches called electrodes may be placed on your chest. These will be connected to a machine that monitors your heart rate and rhythm. You'll lie down on a table for the exam. A wand covered in gel will be moved over your chest. Sound waves from the wand will go to your heart and bounce back--or echo back. The sound waves will go to a computer that uses them to make images of your heart. The images can be viewed on a monitor. The images will also be recorded on the computer so your provider can look at them later. You may  be asked to change positions or hold your breath for a short time. This makes it easier to get different views or better views of your heart. In some cases, you may be given a dye through an IV. The IV is put into one of your veins. This dye can make the areas of your heart easier to see. The procedure may vary among providers and hospitals. What can I expect after the test? You may return to your normal diet, activities, and medicines unless your provider tells you not to. If an IV was placed for the test, it will be removed. It's up to you to get the results of your test. Ask your provider, or the department that's doing the test, when your results will be ready. This information is not intended to  replace advice given to you by your health care provider. Make sure you discuss any questions you have with your health care provider. Document Revised: 10/03/2022 Document Reviewed: 10/03/2022 Elsevier Patient Education  2024 Elsevier Inc.         Signed, Redell Cave, MD  09/23/2023 9:29 AM    [facility]

## 2023-09-24 ENCOUNTER — Institutional Professional Consult (permissible substitution): Payer: Medicaid Other | Admitting: Internal Medicine

## 2023-09-26 ENCOUNTER — Other Ambulatory Visit: Payer: Self-pay | Admitting: Internal Medicine

## 2023-09-26 DIAGNOSIS — I10 Essential (primary) hypertension: Secondary | ICD-10-CM

## 2023-09-28 ENCOUNTER — Telehealth: Payer: Self-pay | Admitting: Family Medicine

## 2023-09-28 NOTE — Telephone Encounter (Signed)
 Both prescriptions sent to pharmacy today via separate encounter.

## 2023-09-28 NOTE — Telephone Encounter (Signed)
 Requested Prescriptions  Pending Prescriptions Disp Refills   amLODipine  (NORVASC ) 10 MG tablet [Pharmacy Med Name: amLODIPine  Besylate 10 MG Oral Tablet] 90 tablet 0    Sig: TAKE 1 TABLET BY MOUTH ONCE DAILY TO LOWER BLOOD PRESSURE     Cardiovascular: Calcium  Channel Blockers 2 Failed - 09/28/2023 12:04 PM      Failed - Last BP in normal range    BP Readings from Last 1 Encounters:  09/23/23 (!) 144/72         Passed - Last Heart Rate in normal range    Pulse Readings from Last 1 Encounters:  09/23/23 62         Passed - Valid encounter within last 6 months    Recent Outpatient Visits           1 month ago Essential hypertension   [place] [facility] [place], Asencion Blacksmith, DO   5 months ago Primary hypertension   [place] [facility] [place], Asencion Blacksmith, DO   1 year ago Upper respiratory infection, acute   [facility] [facility] [place] [place] Presume, MD   1 year ago Essential hypertension   [place] [facility] [place] [place] Presume, MD   2 years ago Essential hypertension   [place] [facility] [place] [facility] [place] [place] Presume, MD       Future Appointments             In 4 weeks [place], Asencion Blacksmith, DO [facility] [facility]   In 2 months Constancia Delton, MD [facility]             valsartan -hydrochlorothiazide  (DIOVAN -HCT) 320-25 MG tablet [Pharmacy Med Name: Valsartan -hydroCHLOROthiazide  320-25 MG Oral Tablet] 90 tablet 0    Sig: Take 1 tablet by mouth once daily     Cardiovascular: ARB + Diuretic Combos Failed - 09/28/2023 12:04 PM      Failed - Last BP in normal range    BP Readings from Last 1 Encounters:  09/23/23 (!) 144/72         Passed - K in normal range and within 180 days    Potassium  Date Value Ref Range Status  08/31/2023 3.9 3.5 -  5.2 mmol/L Final  05/28/2012 3.1 (L) 3.5 - 5.1 mmol/L Final         Passed - Na in normal range and within 180 days    Sodium  Date Value Ref Range Status  08/31/2023 139 134 - 144 mmol/L Final  05/28/2012 140 136 - 145 mmol/L Final         Passed - Cr in normal range and within 180 days    Creat  Date Value Ref Range Status  09/28/2015 0.81 0.50 - 1.10 mg/dL Final   Creatinine, Ser  Date Value Ref Range Status  08/31/2023 0.93 0.57 - 1.00 mg/dL Final         Passed - eGFR is 10 or above and within 180 days    GFR, Est African American  Date Value Ref Range Status  09/28/2015 >89 >=60 mL/min Final   GFR calc Af Amer  Date Value Ref Range Status  01/05/2020 82 >59 mL/min/1.73 Final    Comment:    **[facility] currently reports eGFR in compliance with the current**   recommendations  of the [employer]. [facility] will   update reporting as new guidelines are published from the NKF-ASN   Task force.    GFR, Est Non African American  Date Value Ref Range Status  09/28/2015 86 >=60 mL/min Final    Comment:      The estimated GFR is a calculation valid for adults (>=27 years old) that uses the CKD-EPI algorithm to adjust for age and sex. It is   not to be used for children, pregnant women, hospitalized patients,    patients on dialysis, or with rapidly changing kidney function. According to the NKDEP, eGFR >89 is normal, 60-89 shows mild impairment, 30-59 shows moderate impairment, 15-29 shows severe impairment and <15 is ESRD.      GFR, Estimated  Date Value Ref Range Status  06/08/2023 >60 >60 mL/min Final    Comment:    (NOTE) Calculated using the CKD-EPI Creatinine Equation (2021)    eGFR  Date Value Ref Range Status  08/31/2023 72 >59 mL/min/1.73 Final         Passed - Patient is not pregnant      Passed - Valid encounter within last 6 months    Recent Outpatient Visits           1 month ago Essential hypertension   [place] [facility] [place], Asencion Blacksmith, DO   5 months ago Primary hypertension   [place] [facility] [place], Asencion Blacksmith, DO   1 year ago Upper respiratory infection, acute   [place] [facility] [place] [place] Presume, MD   1 year ago Essential hypertension   [place] [facility] [place] [facility] [place] [place] Presume, MD   2 years ago Essential hypertension   [place] [facility] [place] [facility] [place] [place] Presume, MD       Future Appointments             In 4 weeks [place], Asencion Blacksmith, DO [facility]   In 2 months Constancia Delton, MD [facility]

## 2023-09-28 NOTE — Telephone Encounter (Signed)
 [facility] faxed refill request for the following medications:   amLODipine  (NORVASC ) 10 MG tablet   valsartan -hydrochlorothiazide  (DIOVAN -HCT) 320-25 MG tablet    Please advise.

## 2023-10-08 ENCOUNTER — Ambulatory Visit: Payer: Medicaid Other

## 2023-10-21 ENCOUNTER — Institutional Professional Consult (permissible substitution): Payer: Medicaid Other | Admitting: Internal Medicine

## 2023-10-26 ENCOUNTER — Ambulatory Visit: Payer: Self-pay | Admitting: Family Medicine

## 2023-10-29 ENCOUNTER — Ambulatory Visit
Admission: RE | Admit: 2023-10-29 | Discharge: 2023-10-29 | Disposition: A | Discharge status: Home / Self Care | Source: Ambulatory Visit | Attending: Internal Medicine | Admitting: Internal Medicine

## 2023-10-29 ENCOUNTER — Encounter: Payer: Self-pay | Admitting: Internal Medicine

## 2023-10-29 ENCOUNTER — Ambulatory Visit: Admitting: Internal Medicine

## 2023-10-29 VITALS — BP 126/70 | HR 73 | Temp 97.6°F | Ht 64.0 in | Wt 189.6 lb

## 2023-10-29 DIAGNOSIS — E669 Obesity, unspecified: Secondary | ICD-10-CM | POA: Diagnosis not present

## 2023-10-29 DIAGNOSIS — J4520 Mild intermittent asthma, uncomplicated: Secondary | ICD-10-CM | POA: Insufficient documentation

## 2023-10-29 DIAGNOSIS — Z6832 Body mass index (BMI) 32.0-32.9, adult: Secondary | ICD-10-CM

## 2023-10-29 DIAGNOSIS — J45909 Unspecified asthma, uncomplicated: Secondary | ICD-10-CM | POA: Diagnosis not present

## 2023-10-29 DIAGNOSIS — G4733 Obstructive sleep apnea (adult) (pediatric): Secondary | ICD-10-CM

## 2023-10-29 NOTE — Patient Instructions (Signed)
 Recommend home sleep test to assess for sleep apnea Recommend pulmonary function testing to assess for lung function  Recommend chest x-ray to assess lungs  Continue Symbicort inhaler as prescribed Rinse mouth after every use Continue albuterol as needed  Avoid Allergens and Irritants Avoid secondhand smoke Avoid SICK contacts Recommend  Masking  when appropriate Recommend Keep up-to-date with vaccinations  Recommend weight loss

## 2023-10-29 NOTE — Progress Notes (Signed)
 Name: Caitlin [name] MRN: [id number] DOB: 06-Apr-1966    CHIEF COMPLAINT:  EXCESSIVE DAYTIME SLEEPINESS ASSESSMENT FOR SLEEP APNEA   HISTORY OF PRESENT ILLNESS: Patient is seen today for problems and issues with sleep related to excessive daytime sleepiness Patient  has been having sleep problems for many years Patient has been having excessive daytime sleepiness for a long time Patient has been having extreme fatigue and tiredness, lack of energy +  very Loud snoring every night + struggling breathe at night and gasps for air   Discussed sleep data and reviewed with patient.  Encouraged proper weight management.  Discussed driving precautions and its relationship with hypersomnolence.  Discussed operating dangerous equipment and its relationship with hypersomnolence.  Discussed sleep hygiene, and benefits of a fixed sleep waked time.  The importance of getting eight or more hours of sleep discussed with patient.  Discussed limiting the use of the computer and television before bedtime.  Decrease naps during the day, so night time sleep will become enhanced.  Limit caffeine, and sleep deprivation.  HTN, stroke, and heart failure are potential risk factors.   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 and teeth pain  Patient is a non-smoker Nonalcoholic No secondhand smoke exposure  Patient diagnosed with asthma 5 years ago Seems to be controlled at this time however she is dependent on Symbicort inhaler Uses albuterol infrequently  No exacerbation at this time No evidence of heart failure at this time No evidence or signs of infection at this time No respiratory distress No fevers, chills, nausea, vomiting, diarrhea No evidence of lower extremity edema No evidence hemoptysis   EPWORTH SLEEP SCORE    10/29/2023     9:59 AM  Results of the Epworth flowsheet  Sitting and reading 2  Watching TV 2  Sitting, inactive in a public place (e.g. a theatre or a meeting) 0  As a passenger in a car for an hour without a break 0  Lying down to rest in the afternoon when circumstances permit 2  Sitting and talking to someone 2  Sitting quietly after a lunch without alcohol 3  In a car, while stopped for a few minutes in traffic 2  Total score 13      PAST MEDICAL HISTORY :   has a past medical history of Asthma, Deaf, GERD (gastroesophageal reflux disease), Hypertension (2011), NO (nasal obstruction), and OSA (obstructive sleep apnea).  has a past surgical history that includes Cholecystectomy; Cesarean section; and Tubal ligation. Prior to Admission medications   Medication Sig Start Date End Date Taking? Authorizing Provider  albuterol (VENTOLIN HFA) 108 (90 Base) MCG/ACT inhaler Inhale 2 puffs into the lungs every 6 (six) hours as needed for wheezing or shortness of breath. 04/22/23   Sherlyn Hay, DO  amLODipine (NORVASC) 10 MG tablet TAKE 1 TABLET BY MOUTH ONCE DAILY TO LOWER BLOOD PRESSURE 09/28/23   Sherlyn Hay, DO  atorvastatin (LIPITOR) 10 MG tablet Take 1 tablet by mouth once daily 12/19/22   Marcine Matar, MD  Blood Pressure Monitor KIT Use to check blood pressure daily 10/27/19   Fulp, Cammie, MD  budesonide-formoterol (SYMBICORT) 80-4.5 MCG/ACT inhaler Inhale 2 puffs into the lungs in the morning and at bedtime. 04/22/23   Sherlyn Hay, DO  buPROPion (WELLBUTRIN XL) 150 MG 24 hr  tablet Take 1 tablet (150 mg total) by mouth daily. Take 1 tablet daily for 3 days, then take 2 tablets daily Patient not taking: Reported on 09/23/2023 08/27/23   Sherlyn Hay, DO  carvedilol (COREG) 3.125 MG tablet Take 1 tablet (3.125 mg total) by mouth 2 (two) times daily with a meal. 04/22/23   Pardue, Monico Blitz, DO  famotidine (PEPCID) 20 MG tablet Take 1 tablet (20 mg total) by mouth daily before breakfast. 04/22/23    Pardue, Monico Blitz, DO  levocetirizine (XYZAL) 5 MG tablet Take 1 tablet (5 mg total) by mouth every evening. Patient not taking: Reported on 09/23/2023 12/12/21   Marcelyn Bruins, MD  Minoxidil 5 % FOAM Apply 1 Application topically 2 (two) times daily. Patient not taking: Reported on 09/23/2023 04/22/23   Sherlyn Hay, DO  Misc. Devices MISC CPAP therapy on 17 cm H2O . Patient to use X-Small size Resmed Full Face Mask AirFit F10. Diagnosis - Obstructive sleep apnea Patient not taking: Reported on 09/23/2023 01/25/20   Hoy Register, MD  naltrexone (DEPADE) 50 MG tablet Take 0.5 tablets (25 mg total) by mouth daily. Patient not taking: Reported on 09/23/2023 08/27/23   Sherlyn Hay, DO  naproxen (NAPROSYN) 500 MG tablet Take 1 tablet (500 mg total) by mouth 2 (two) times daily with a meal. Patient not taking: Reported on 09/23/2023 01/11/23 01/11/24  Willy Eddy, MD  nystatin (MYCOSTATIN/NYSTOP) powder Apply 1 application topically 2 (two) times daily. Apply to affected area under breast Patient not taking: Reported on 09/23/2023 04/25/21   Marcine Matar, MD  Olopatadine HCl 0.2 % SOLN Apply 1 drop to eye daily as needed (itchy/watery eyes). Patient not taking: Reported on 09/23/2023 12/12/21   Marcelyn Bruins, MD  omeprazole (PRILOSEC OTC) 20 MG tablet Take 2 tablets (40 mg total) by mouth daily for 14 days. Patient not taking: Reported on 09/23/2023 01/28/23 02/11/23  Orvil Feil, PA-C  traZODone (DESYREL) 50 MG tablet Take 0.5-1 tablets (25-50 mg total) by mouth at bedtime as needed for sleep. Patient not taking: Reported on 09/23/2023 10/27/19   Cain Saupe, MD  valsartan-hydrochlorothiazide (DIOVAN-HCT) 320-25 MG tablet Take 1 tablet by mouth once daily 09/28/23   Pardue, Sarah N, DO  Vitamin D, Ergocalciferol, (DRISDOL) 1.25 MG (50000 UNIT) CAPS capsule Take 1 capsule (50,000 Units total) by mouth every 7 (seven) days. 09/05/23   Sherlyn Hay, DO   Allergies  Allergen Reactions    Egg-Derived Products Nausea And Vomiting   Milk-Related Compounds     FAMILY HISTORY:  family history includes Heart disease in her mother; Stroke in her mother. SOCIAL HISTORY:  reports that she has never smoked. She has never used smokeless tobacco. She reports current alcohol use of about 1.0 standard drink of alcohol per week. She reports that she does not currently use drugs after having used the following drugs: "Crack" cocaine.   Review of Systems:  Gen:  Denies  fever, sweats, chills weight loss  HEENT: Denies blurred vision, double vision, ear pain, eye pain, hearing loss, nose bleeds, sore throat Cardiac:  No dizziness, chest pain or heaviness, chest tightness,edema, No JVD Resp:   No cough, -sputum production, -shortness of breath,-wheezing, -hemoptysis,  Gi: Denies swallowing difficulty, stomach pain, nausea or vomiting, diarrhea, constipation, bowel incontinence Gu:  Denies bladder incontinence, burning urine Ext:   Denies Joint pain, stiffness or swelling Skin: Denies  skin rash, easy bruising or bleeding or hives Endoc:  Denies polyuria,  polydipsia , polyphagia or weight change Psych:   Denies depression, insomnia or hallucinations  Other:  All other systems negative   ALL OTHER ROS ARE NEGATIVE BP 126/70 (BP Location: Left Arm, Patient Position: Sitting, Cuff Size: Normal)   Pulse 73   Temp 97.6 F (36.4 C) (Temporal)   Ht 5\' 4"  (1.626 m)   Wt 189 lb 9.6 oz (86 kg)   LMP 05/08/2016   SpO2 97%   BMI 32.54 kg/m    Physical Examination:   General Appearance: No distress  EYES PERRLA, EOM intact.   NECK Supple, No JVD ORAL CAVITY MALLAMPATI 4 Pulmonary: normal breath sounds, No wheezing.  CardiovascularNormal S1,S2.  No m/r/g.   Abdomen: Benign, Soft, non-tender. Skin:   warm, no rashes, no ecchymosis  Extremities: normal, no cyanosis, clubbing. Neuro:without focal findings,  speech normal  PSYCHIATRIC: Mood, affect within normal limits.   ALL OTHER  ROS ARE NEGATIVE    ASSESSMENT AND PLAN SYNOPSIS 59 year old pleasant female seen today for assessment for sleep apnea and has actually diagnosis of severe sleep apnea with AHI of 34 in the setting of underlying reactive airways disease with asthma   Assessment for asthma Well-controlled Continue to take Symbicort 2 puffs twice daily Recommend rinsing mouth out after use Continue to use albuterol as needed Avoid Allergens and Irritants Avoid secondhand smoke Avoid SICK contacts Recommend  Masking  when appropriate Recommend Keep up-to-date with vaccinations Obtain pulmonary function testing Obtain 2 view chest x-ray   Patient with signs and symptoms of excessive daytime sleepiness with  diagnosis of obstructive sleep apnea in the setting of obesity and deconditioned state AHI 34 Recommend home sleep test for further assessment and establishment   Obesity -recommend significant weight loss -recommend changing diet  Deconditioned state -Recommend increased daily activity and exercise   MEDICATION ADJUSTMENTS/LABS AND TESTS ORDERED: Recommend Sleep Study Recommend weight loss   CURRENT MEDICATIONS REVIEWED AT LENGTH WITH PATIENT TODAY   Patient  satisfied with Plan of action and management. All questions answered  Follow up  3 months   I spent a total of  65 minutes reviewing chart data, face-to-face evaluation with the patient, counseling and coordination of care as detailed above.    Lucie Leather, M.D.  [facility]  Medical Director [facility] Medical Director [facility]

## 2023-11-04 DIAGNOSIS — R002 Palpitations: Secondary | ICD-10-CM

## 2023-11-06 ENCOUNTER — Encounter: Payer: Self-pay | Admitting: *Deleted

## 2023-11-10 ENCOUNTER — Other Ambulatory Visit: Payer: Self-pay | Admitting: Internal Medicine

## 2023-11-11 ENCOUNTER — Ambulatory Visit: Payer: Medicaid Other | Attending: Cardiology

## 2023-11-11 DIAGNOSIS — R072 Precordial pain: Secondary | ICD-10-CM

## 2023-11-11 LAB — ECHOCARDIOGRAM COMPLETE
AR max vel: 1.76 cm2
AV Area VTI: 1.81 cm2
AV Area mean vel: 1.75 cm2
AV Mean grad: 5 mmHg
AV Peak grad: 9.5 mmHg
Ao pk vel: 1.54 m/s
Area-P 1/2: 3.48 cm2
S' Lateral: 2.4 cm

## 2023-11-12 ENCOUNTER — Other Ambulatory Visit: Payer: Self-pay | Admitting: Family Medicine

## 2023-11-12 DIAGNOSIS — J4540 Moderate persistent asthma, uncomplicated: Secondary | ICD-10-CM

## 2023-11-12 NOTE — Telephone Encounter (Unsigned)
 Copied from CRM [id number]. Topic: Clinical - Medication Refill >> Nov 12, 2023  9:49 AM Shelah Lewandowsky wrote: Most Recent Primary Care Visit:  Provider: Sherlyn Hay  Department: [id number]  Visit Type: OFFICE VISIT  Date: 08/27/2023  Medication: atorvastatin (LIPITOR) 10 MG tablet budesonide-formoterol (SYMBICORT) 80-4.5 MCG/ACT inhaler   Has the patient contacted their pharmacy? Yes (Agent: If no, request that the patient contact the pharmacy for the refill. If patient does not wish to contact the pharmacy document the reason why and proceed with request.) (Agent: If yes, when and what did the pharmacy advise?)  Is this the correct pharmacy for this prescription? Yes If no, delete pharmacy and type the correct one.  This is the patient's preferred pharmacy:   [facility] [address]), [place] - [address] [place]) [place] [address] Phone: [phone] Fax: [phone]   Has the prescription been filled recently? Yes  Is the patient out of the medication? Yes  Has the patient been seen for an appointment in the last year OR does the patient have an upcoming appointment? Yes  Can we respond through MyChart? Yes  Agent: Please be advised that Rx refills may take up to 3 business days. We ask that you follow-up with your pharmacy.

## 2023-11-13 MED ORDER — BUDESONIDE-FORMOTEROL FUMARATE 80-4.5 MCG/ACT IN AERO: 2.0000 | INHALATION_SPRAY | Freq: Two times a day (BID) | RESPIRATORY_TRACT | 2 refills | Status: DC

## 2023-11-13 MED ORDER — ATORVASTATIN CALCIUM 10 MG PO TABS: 10.0000 mg | ORAL_TABLET | Freq: Every day | ORAL | 0 refills | Status: DC

## 2023-11-13 NOTE — Telephone Encounter (Signed)
 Last OV 09/02/23 within protocol.  Requested Prescriptions  Pending Prescriptions Disp Refills   atorvastatin (LIPITOR) 10 MG tablet 90 tablet 0    Sig: Take 1 tablet (10 mg total) by mouth daily.     Cardiovascular:  Antilipid - Statins Failed - 11/13/2023  1:57 PM      Failed - Valid encounter within last 12 months    Recent Outpatient Visits           1 year ago Upper respiratory infection, acute   [facility] [place]. [facility] Marcine Matar, MD   1 year ago Essential hypertension   [facility] [place]. [facility] Marcine Matar, MD   2 years ago Essential hypertension   [facility] [place] [facility] [place]. [facility] Marcine Matar, MD   2 years ago Primary hypertension   [place] [facility] [place]. [facility] Drucilla Chalet, RPH-CPP   2 years ago Essential hypertension   [facility] [facility] [place] [facility] [place]. [facility] Marcine Matar, MD       Future Appointments             In 1 month Azucena Cecil, Arlys John, MD [facility]            Failed - Lipid Panel in normal range within the last 12 months    Cholesterol, Total  Date Value Ref Range Status  08/31/2023 198 100 - 199 mg/dL Final   LDL Chol Calc (NIH)  Date Value Ref Range Status  08/31/2023 143 (H) 0 - 99 mg/dL Final   HDL  Date Value Ref Range Status  08/31/2023 40 >39 mg/dL Final   Triglycerides  Date Value Ref Range Status  08/31/2023 84 0 - 149 mg/dL Final         Passed - Patient is not pregnant       budesonide-formoterol (SYMBICORT) 80-4.5 MCG/ACT inhaler 1 each 2    Sig: Inhale 2 puffs into the lungs in the morning and at bedtime.     Pulmonology:  Combination Products Failed - 11/13/2023  1:57 PM      Failed - Valid encounter within last 12 months    Recent  Outpatient Visits           1 year ago Upper respiratory infection, acute   [place] [facility] [place]. [facility] Marcine Matar, MD   1 year ago Essential hypertension   [place] [facility] [place] [facility] [place]. [facility] Marcine Matar, MD   2 years ago Essential hypertension   [place] [facility] [place] [facility] Of [place]. [facility] Marcine Matar, MD   2 years ago Primary hypertension   [place] [facility] [place]. [facility] Drucilla Chalet, RPH-CPP   2 years ago Essential hypertension   [place] [facility] [place] [facility] Of [place]. [facility] Marcine Matar, MD       Future Appointments             In 1 month Agbor-Etang, Arlys John, MD [facility] at [facility]

## 2023-11-20 ENCOUNTER — Ambulatory Visit: Admitting: Family Medicine

## 2023-11-20 ENCOUNTER — Encounter: Payer: Self-pay | Admitting: Family Medicine

## 2023-11-20 VITALS — BP 113/67 | HR 64 | Ht 64.0 in | Wt 191.9 lb

## 2023-11-20 DIAGNOSIS — J4540 Moderate persistent asthma, uncomplicated: Secondary | ICD-10-CM | POA: Diagnosis not present

## 2023-11-20 DIAGNOSIS — E559 Vitamin D deficiency, unspecified: Secondary | ICD-10-CM

## 2023-11-20 DIAGNOSIS — E66811 Obesity, class 1: Secondary | ICD-10-CM

## 2023-11-20 DIAGNOSIS — Z8679 Personal history of other diseases of the circulatory system: Secondary | ICD-10-CM | POA: Diagnosis not present

## 2023-11-20 DIAGNOSIS — N950 Postmenopausal bleeding: Secondary | ICD-10-CM

## 2023-11-20 NOTE — Patient Instructions (Signed)
 [employer] [phone] or [phone] Available Monday - Friday, 8:00am to 5:00pm CST Original number given - [phone]

## 2023-11-20 NOTE — Progress Notes (Signed)
 Established patient visit   Patient: Caitlin [name]   DOB: 1965-09-02   58 y.o. Female  MRN: [id number] Visit Date: 11/20/2023  Today's healthcare provider: Carlean Charter, DO   Chief Complaint  Patient presents with   Follow-up   Subjective    HPI Caitlin [name] is a 58 year old female who presents with abnormal uterine bleeding after menopause.  Sign language interpreter present in the room.  She experienced an unusual menstrual period starting this past Friday, lasting for three days. The bleeding was accompanied by increased pain on the first day, with significantly reduced bleeding on the second day, and complete cessation by the third day. She has not had a period since the age of 81, making this episode atypical. In 2002, she experienced a similar episode of bleeding due to cysts, which resolved with antibiotics.  Over the past two months, she has experienced headaches and dizziness. The headaches occur once or twice a week, while dizziness is occasional, typically when she stands up too quickly after sitting for a while. She does not experience dizziness when getting up from a lying position. The dizziness is not present every day.  She has a history of supraventricular tachycardia, which was detected once during a heart monitor test. She is currently on carvedilol, which she continues to take. She occasionally experiences palpitations, particularly when she is not active, such as when shopping.  She finds it difficult to lose weight and is awaiting a cardiac catheterization scheduled for May before starting weight loss medication (generic Contrave). She has been taking vitamin D once a week and iron supplements regularly. She uses Symbicort daily and albuterol as needed for her respiratory condition.  She reports feeling more easily fatigued than in the past, attributing it to aging. She occasionally consumes alcohol, typically just one drink at a time. No ongoing  chest pain.      Medications: Outpatient Medications Prior to Visit  Medication Sig   albuterol (VENTOLIN HFA) 108 (90 Base) MCG/ACT inhaler Inhale 2 puffs into the lungs every 6 (six) hours as needed for wheezing or shortness of breath.   amLODipine (NORVASC) 10 MG tablet TAKE 1 TABLET BY MOUTH ONCE DAILY TO LOWER BLOOD PRESSURE   atorvastatin (LIPITOR) 10 MG tablet Take 1 tablet (10 mg total) by mouth daily.   Blood Pressure Monitor KIT Use to check blood pressure daily   budesonide-formoterol (SYMBICORT) 80-4.5 MCG/ACT inhaler Inhale 2 puffs into the lungs in the morning and at bedtime.   buPROPion (WELLBUTRIN XL) 150 MG 24 hr tablet Take 1 tablet (150 mg total) by mouth daily. Take 1 tablet daily for 3 days, then take 2 tablets daily   carvedilol (COREG) 3.125 MG tablet Take 1 tablet (3.125 mg total) by mouth 2 (two) times daily with a meal.   famotidine (PEPCID) 20 MG tablet Take 1 tablet (20 mg total) by mouth daily before breakfast.   levocetirizine (XYZAL) 5 MG tablet Take 1 tablet (5 mg total) by mouth every evening.   Minoxidil 5 % FOAM Apply 1 Application topically 2 (two) times daily.   Misc. Devices MISC CPAP therapy on 17 cm H2O . Patient to use X-Small size Resmed Full Face Mask AirFit F10. Diagnosis - Obstructive sleep apnea   naltrexone (DEPADE) 50 MG tablet Take 0.5 tablets (25 mg total) by mouth daily.   naproxen (NAPROSYN) 500 MG tablet Take 1 tablet (500 mg total) by mouth 2 (two) times daily with  a meal.   nystatin (MYCOSTATIN/NYSTOP) powder Apply 1 application topically 2 (two) times daily. Apply to affected area under breast   Olopatadine HCl 0.2 % SOLN Apply 1 drop to eye daily as needed (itchy/watery eyes).   traZODone (DESYREL) 50 MG tablet Take 0.5-1 tablets (25-50 mg total) by mouth at bedtime as needed for sleep.   valsartan-hydrochlorothiazide (DIOVAN-HCT) 320-25 MG tablet Take 1 tablet by mouth once daily   Vitamin D, Ergocalciferol, (DRISDOL) 1.25 MG (50000  UNIT) CAPS capsule Take 1 capsule (50,000 Units total) by mouth every 7 (seven) days.   omeprazole (PRILOSEC OTC) 20 MG tablet Take 2 tablets (40 mg total) by mouth daily for 14 days. (Patient not taking: Reported on 09/23/2023)   No facility-administered medications prior to visit.        Objective    BP 113/67   Pulse 64   Ht 5\' 4"  (1.626 m)   Wt 191 lb 14.4 oz (87 kg)   LMP 05/08/2016   SpO2 100%   BMI 32.94 kg/m     Physical Exam Constitutional:      Appearance: Normal appearance.  HENT:     Head: Normocephalic and atraumatic.  Eyes:     General: No scleral icterus.    Extraocular Movements: Extraocular movements intact.     Conjunctiva/sclera: Conjunctivae normal.  Cardiovascular:     Rate and Rhythm: Normal rate and regular rhythm.     Pulses: Normal pulses.     Heart sounds: Normal heart sounds.  Pulmonary:     Effort: Pulmonary effort is normal. No respiratory distress.     Breath sounds: Normal breath sounds.  Abdominal:     General: Bowel sounds are normal.     Palpations: Abdomen is soft.  Musculoskeletal:     Right lower leg: No edema.     Left lower leg: No edema.  Skin:    General: Skin is warm and dry.  Neurological:     Mental Status: She is alert and oriented to person, place, and time. Mental status is at baseline.  Psychiatric:        Mood and Affect: Mood normal.        Behavior: Behavior normal.      No results found for any visits on 11/20/23.  Assessment & Plan    Postmenopausal vaginal bleeding -     US  PELVIC COMPLETE WITH TRANSVAGINAL; Future -     Ambulatory referral to Obstetrics / Gynecology  Obesity (BMI 30.0-34.9)  Personal history of supraventricular tachycardia  Moderate persistent asthma without complication  Vitamin D deficiency   Postmenopausal bleeding Unexpected bleeding post-menopause suggests endometrial hyperplasia or malignancy. - Order pelvic ultrasound. - Refer to OB-GYN for further  evaluation.  Dizziness Dizziness upon standing indicates possible orthostatic hypotension. - Advise to rise slowly from sitting to standing. - No recent changes in medications; encouraged increased intake of fluids.  Will continue to monitor for the time being.  Advised patient to contact clinic if not resolving or if getting worse.  History of supraventricular tachycardia (SVT) Single SVT episode with palpitations; on carvedilol with low heart rate. - Continue carvedilol as prescribed. - Monitor heart rate and symptoms.  Asthma Uses Symbicort and albuterol; inhaler prescription needs renewal. - Renew prescription for Symbicort and albuterol inhalers.  Obesity Weight loss difficult; cardiologist advised delaying medication until post-cardiac catheterization. - Await results of cardiac catheterization before initiating generic Contrave.  Vitamin D deficiency On weekly supplementation; deficiency may cause fatigue. - Continue vitamin  D supplementation. - Recheck vitamin D levels in six months.  General [facility] Maintenance Clinic stopped flu shots; advised for next season. - Advise to receive flu vaccination next season.   Follow-up Scheduled for re-evaluation and blood work in three months. - Schedule follow-up appointment in three months. - Plan to recheck blood work at follow-up. - Assess weight loss progress and general health - See sleep specialist for sleep study -scheduled next week - Call speech generating device company for follow-up; gave patient multiple members to try to reach them at.  Return in about 3 months (around 02/19/2024) for chronic /fu.      I discussed the assessment and treatment plan with the patient  The patient was provided an opportunity to ask questions and all were answered. The patient agreed with the plan and demonstrated an understanding of the instructions.   The patient was advised to call back or seek an in-person evaluation if the symptoms  worsen or if the condition fails to improve as anticipated.    Carlean Charter, DO  [facility] [phone] (phone) [phone] (fax)  [facility]

## 2023-11-27 ENCOUNTER — Ambulatory Visit: Attending: Family Medicine

## 2023-12-01 DIAGNOSIS — Z8679 Personal history of other diseases of the circulatory system: Secondary | ICD-10-CM | POA: Insufficient documentation

## 2023-12-01 DIAGNOSIS — N950 Postmenopausal bleeding: Secondary | ICD-10-CM | POA: Insufficient documentation

## 2023-12-10 ENCOUNTER — Encounter: Admitting: Obstetrics and Gynecology

## 2023-12-24 ENCOUNTER — Encounter: Payer: Self-pay | Admitting: Cardiology

## 2023-12-24 ENCOUNTER — Ambulatory Visit: Payer: Medicaid Other | Attending: Cardiology | Admitting: Cardiology

## 2023-12-24 VITALS — BP 96/66 | HR 68 | Ht 64.0 in | Wt 189.0 lb

## 2023-12-24 DIAGNOSIS — R002 Palpitations: Secondary | ICD-10-CM | POA: Diagnosis not present

## 2023-12-24 DIAGNOSIS — E7800 Pure hypercholesterolemia, unspecified: Secondary | ICD-10-CM | POA: Diagnosis not present

## 2023-12-24 DIAGNOSIS — I10 Essential (primary) hypertension: Secondary | ICD-10-CM

## 2023-12-24 DIAGNOSIS — R072 Precordial pain: Secondary | ICD-10-CM | POA: Diagnosis not present

## 2023-12-24 NOTE — Progress Notes (Signed)
 Cardiology Office Note:    Date:  12/24/2023   ID:  Caitlin [name], DOB December 15, 1965, MRN [id number]  PCP:  Carlean Charter, DO   [facility] Providers Cardiologist:  None     Referring MD: Carlean Charter, DO   No chief complaint on file.   History of Present Illness:    Caitlin [name] is a 58 y.o. female with a hx of hypertension, hyperlipidemia, asthma, deafness presenting with dizziness and palpitations.  Sign interpreter used for this visit due to patient's condition/deafness.  Previously seen for chest pain and palpitations.  Echo and cardiac monitor ordered to evaluate cardiac etiology.  Patient denies any new concerns, symptoms of palpitations overall have improved.  She takes Lipitor as prescribed, has no issues.  Complains of heartburn, burping and reflux symptoms moving up to her throat.  Has not taken omeprazole  for some time now.  Presents for cardiac testing results.  Past Medical History:  Diagnosis Date   Asthma    Deaf    GERD (gastroesophageal reflux disease)    Hypertension 2011   NO (nasal obstruction)    OSA (obstructive sleep apnea)     Past Surgical History:  Procedure Laterality Date   CESAREAN SECTION     CHOLECYSTECTOMY     TUBAL LIGATION      Current Medications: Current Meds  Medication Sig   albuterol  (VENTOLIN  HFA) 108 (90 Base) MCG/ACT inhaler Inhale 2 puffs into the lungs every 6 (six) hours as needed for wheezing or shortness of breath.   amLODipine  (NORVASC ) 10 MG tablet TAKE 1 TABLET BY MOUTH ONCE DAILY TO LOWER BLOOD PRESSURE   atorvastatin  (LIPITOR) 10 MG tablet Take 1 tablet (10 mg total) by mouth daily.   Blood Pressure Monitor KIT Use to check blood pressure daily   budesonide -formoterol  (SYMBICORT ) 80-4.5 MCG/ACT inhaler Inhale 2 puffs into the lungs in the morning and at bedtime.   buPROPion  (WELLBUTRIN  XL) 150 MG 24 hr tablet Take 1 tablet (150 mg total) by mouth daily. Take 1 tablet daily for 3 days, then take  2 tablets daily   carvedilol  (COREG ) 3.125 MG tablet Take 1 tablet (3.125 mg total) by mouth 2 (two) times daily with a meal.   famotidine  (PEPCID ) 20 MG tablet Take 1 tablet (20 mg total) by mouth daily before breakfast.   levocetirizine (XYZAL ) 5 MG tablet Take 1 tablet (5 mg total) by mouth every evening.   Minoxidil  5 % FOAM Apply 1 Application topically 2 (two) times daily.   Misc. Devices MISC CPAP therapy on 17 cm H2O . Patient to use X-Small size Resmed Full Face Mask AirFit F10. Diagnosis - Obstructive sleep apnea   naltrexone  (DEPADE) 50 MG tablet Take 0.5 tablets (25 mg total) by mouth daily.   naproxen  (NAPROSYN ) 500 MG tablet Take 1 tablet (500 mg total) by mouth 2 (two) times daily with a meal.   nystatin  (MYCOSTATIN /NYSTOP ) powder Apply 1 application topically 2 (two) times daily. Apply to affected area under breast   Olopatadine  HCl 0.2 % SOLN Apply 1 drop to eye daily as needed (itchy/watery eyes).   traZODone  (DESYREL ) 50 MG tablet Take 0.5-1 tablets (25-50 mg total) by mouth at bedtime as needed for sleep.   valsartan -hydrochlorothiazide  (DIOVAN -HCT) 320-25 MG tablet Take 1 tablet by mouth once daily   Vitamin D , Ergocalciferol , (DRISDOL ) 1.25 MG (50000 UNIT) CAPS capsule Take 1 capsule (50,000 Units total) by mouth every 7 (seven) days.     Allergies:  Egg-derived products and Milk-related compounds   Social History   Socioeconomic History   Marital status: Legally Separated    Spouse name: Not on file   Number of children: Not on file   Years of education: Not on file   Highest education level: Not on file  Occupational History    Employer: UNEMPLOYED  Tobacco Use   Smoking status: Never   Smokeless tobacco: Never  Vaping Use   Vaping status: Never Used  Substance and Sexual Activity   Alcohol use: Yes    Alcohol/week: 1.0 standard drink of alcohol    Types: 1 Glasses of wine per week    Comment: occassionally    Drug use: Not Currently    Types: "Crack"  cocaine   Sexual activity: Yes    Birth control/protection: Surgical  Other Topics Concern   Not on file  Social History Narrative   Not on file   Social Drivers of Health   Financial Resource Strain: Not on file  Food Insecurity: No Food Insecurity (04/22/2023)   Hunger Vital Sign    Worried About Running Out of Food in the Last Year: Never true    Ran Out of Food in the Last Year: Never true  Transportation Needs: Unmet Transportation Needs (04/22/2023)   PRAPARE - Transportation    Lack of Transportation (Medical): Yes    Lack of Transportation (Non-Medical): Yes  Physical Activity: Unknown (04/22/2023)   Exercise Vital Sign    Days of Exercise per Week: 0 days    Minutes of Exercise per Session: Not on file  Stress: Not on file  Social Connections: Moderately Isolated (04/22/2023)   Social Connection and Isolation Panel [NHANES]    Frequency of Communication with Friends and Family: More than three times a week    Frequency of Social Gatherings with Friends and Family: Once a week    Attends Religious Services: Never    Database administrator or Organizations: Yes    Attends Engineer, structural: More than 4 times per year    Marital Status: Separated     Family History: The patient's family history includes Heart disease in her mother; Stroke in her mother. There is no history of Breast cancer.  ROS:   Please see the history of present illness.     All other systems reviewed and are negative.  EKGs/Labs/Other Studies Reviewed:    The following studies were reviewed today:  EKG Interpretation Date/Time:  Thursday Dec 24 2023 09:52:27 EDT Ventricular Rate:  68 PR Interval:  180 QRS Duration:  92 QT Interval:  396 QTC Calculation: 421 R Axis:   -1  Text Interpretation: Normal sinus rhythm Inferior infarct , age undetermined Anterior infarct Confirmed by Constancia Delton ([id number]) on 12/24/2023 10:25:28 AM    Recent Labs: 08/31/2023: ALT 22; BUN 15; Creatinine,  Ser 0.93; Hemoglobin 11.6; Platelets 419; Potassium 3.9; Sodium 139  Recent Lipid Panel    Component Value Date/Time   CHOL 198 08/31/2023 1024   TRIG 84 08/31/2023 1024   HDL 40 08/31/2023 1024   CHOLHDL 5.0 (H) 08/31/2023 1024   CHOLHDL 4.0 03/06/2012 0542   VLDL 7 03/06/2012 0542   LDLCALC 143 (H) 08/31/2023 1024     Risk Assessment/Calculations:        Physical Exam:    VS:  BP 96/66   Pulse 68   Ht 5\' 4"  (1.626 m)   Wt 189 lb (85.7 kg)   LMP 05/08/2016   SpO2 99%  BMI 32.44 kg/m     Wt Readings from Last 3 Encounters:  12/24/23 189 lb (85.7 kg)  11/20/23 191 lb 14.4 oz (87 kg)  10/29/23 189 lb 9.6 oz (86 kg)     GEN:  Well nourished, well developed in no acute distress HEENT: Normal NECK: No JVD; No carotid bruits CARDIAC: RRR, no murmurs, rubs, gallops RESPIRATORY:  Clear to auscultation without rales, wheezing or rhonchi  ABDOMEN: Soft, non-tender, non-distended MUSCULOSKELETAL:  No edema; No deformity  SKIN: Warm and dry NEUROLOGIC:  Alert and oriented x 3 PSYCHIATRIC:  Normal affect   ASSESSMENT:    1. Palpitations   2. Precordial pain   3. Primary hypertension   4. Pure hypercholesterolemia    PLAN:    In order of problems listed above:  Palpitations, cardiac monitor 10/2023 1 run of NSVT lasting 7 beats.  No sustained arrhythmias.  Echo 10/2023 EF 60 to 65%.  No significant structural abnormalities.  Symptoms currently resolved.  Patient reassured. Chest pain, coinciding with palpitations, not associated with exertion.  Echo with no significant structural abnormalities.  Complains of GERD.  Advised to take OTC omeprazole  40 mg daily.  Follow-up with PCP for management of reflux symptoms. Hypertension, BP  controlled/low normal.    Continue Diovan  HCT 320-25 mg daily, Norvasc  10 mg daily, Coreg  3.125 mg twice daily. Hyperlipidemia, continue Lipitor 10 mg daily.   Follow-up as needed      Medication Adjustments/Labs and Tests Ordered: Current  medicines are reviewed at length with the patient today.  Concerns regarding medicines are outlined above.  Orders Placed This Encounter  Procedures   EKG 12-Lead   No orders of the defined types were placed in this encounter.   Patient Instructions  Medication Instructions:  Your Physician recommend you continue on your current medication as directed.    *If you need a refill on your cardiac medications before your next appointment, please call your pharmacy*  Lab Work: No labs ordered today  If you have labs (blood work) drawn today and your tests are completely normal, you will receive your results only by: MyChart Message (if you have MyChart) OR A paper copy in the mail If you have any lab test that is abnormal or we need to change your treatment, we will call you to review the results.  Testing/Procedures: No test ordered today   Follow-Up: At [facility], you and your health needs are our priority.  As part of our continuing mission to provide you with exceptional heart care, our providers are all part of one team.  This team includes your primary Cardiologist (physician) and Advanced Practice Providers or APPs (Physician Assistants and Nurse Practitioners) who all work together to provide you with the care you need, when you need it.  Your next appointment:   Follow up as needed   We recommend signing up for the patient portal called "MyChart".  Sign up information is provided on this After Visit Summary.  MyChart is used to connect with patients for Virtual Visits (Telemedicine).  Patients are able to view lab/test results, encounter notes, upcoming appointments, etc.  Non-urgent messages can be sent to your provider as well.   To learn more about what you can do with MyChart, go to [email or web address].         Signed, Constancia Delton, MD  12/24/2023 11:49 AM    [facility]

## 2023-12-24 NOTE — Patient Instructions (Signed)
 Medication Instructions:  Your Physician recommend you continue on your current medication as directed.    *If you need a refill on your cardiac medications before your next appointment, please call your pharmacy*  Lab Work: No labs ordered today  If you have labs (blood work) drawn today and your tests are completely normal, you will receive your results only by: MyChart Message (if you have MyChart) OR A paper copy in the mail If you have any lab test that is abnormal or we need to change your treatment, we will call you to review the results.  Testing/Procedures: No test ordered today   Follow-Up: At [facility], you and your health needs are our priority.  As part of our continuing mission to provide you with exceptional heart care, our providers are all part of one team.  This team includes your primary Cardiologist (physician) and Advanced Practice Providers or APPs (Physician Assistants and Nurse Practitioners) who all work together to provide you with the care you need, when you need it.  Your next appointment:   Follow up as needed.   We recommend signing up for the patient portal called "MyChart".  Sign up information is provided on this After Visit Summary.  MyChart is used to connect with patients for Virtual Visits (Telemedicine).  Patients are able to view lab/test results, encounter notes, upcoming appointments, etc.  Non-urgent messages can be sent to your provider as well.   To learn more about what you can do with MyChart, go to [email or web address].

## 2023-12-28 ENCOUNTER — Other Ambulatory Visit: Payer: Self-pay | Admitting: Family Medicine

## 2023-12-28 DIAGNOSIS — I10 Essential (primary) hypertension: Secondary | ICD-10-CM

## 2023-12-28 NOTE — Telephone Encounter (Unsigned)
 Copied from CRM [id number]. Topic: Clinical - Medication Refill >> Dec 28, 2023  3:38 PM Carla L wrote: Medication: amLODipine  (NORVASC ) 10 MG tablet carvedilol  (COREG ) 3.125 MG tablet omeprazole  (PRILOSEC  OTC) 20 MG tablet valsartan -hydrochlorothiazide  (DIOVAN -HCT) 320-25 MG tablet  Has the patient contacted their pharmacy? Yes Told to contact the office  This is the patient's preferred pharmacy: [facility] [address] ([address]), [place] - [address] Carlean Charter [place]) [place] [address] Phone: [phone] Fax: [phone]  Is this the correct pharmacy for this prescription? Yes If no, delete pharmacy and type the correct one.   Has the prescription been filled recently? No  Is the patient out of the medication? No  Has the patient been seen for an appointment in the last year OR does the patient have an upcoming appointment? Yes  Can we respond through MyChart? No  Agent: Please be advised that Rx refills may take up to 3 business days. We ask that you follow-up with your pharmacy.

## 2023-12-29 ENCOUNTER — Encounter: Admitting: Obstetrics & Gynecology

## 2023-12-30 MED ORDER — AMLODIPINE BESYLATE 10 MG PO TABS: ORAL_TABLET | ORAL | 0 refills | Status: DC

## 2023-12-30 MED ORDER — VALSARTAN-HYDROCHLOROTHIAZIDE 320-25 MG PO TABS: 1.0000 | ORAL_TABLET | Freq: Every day | ORAL | 0 refills | Status: DC

## 2023-12-30 MED ORDER — CARVEDILOL 3.125 MG PO TABS: 3.1250 mg | ORAL_TABLET | Freq: Two times a day (BID) | ORAL | 3 refills | Status: DC

## 2023-12-30 NOTE — Telephone Encounter (Signed)
 LOV 11/20/2023.  Refused Prilosec  OTC 20 mg because it was for 14 days only.   Per notes pt is no longer taking.  Requested Prescriptions  Pending Prescriptions Disp Refills   omeprazole  (PRILOSEC  OTC) 20 MG tablet 28 tablet 1    Sig: Take 2 tablets (40 mg total) by mouth daily for 14 days.     Gastroenterology: Proton Pump Inhibitors Failed - 12/30/2023 12:40 PM      Failed - Valid encounter within last 12 months    Recent Outpatient Visits           1 month ago Postmenopausal vaginal bleeding   [facility] [place], Asencion Blacksmith, DO   1 year ago Upper respiratory infection, acute   [facility] - A Dept Of [place]. [facility] Lawrance Presume, MD   2 years ago Essential hypertension   [facility] - [facility] Dept Of [place]. [facility] Lawrance Presume, MD   2 years ago Essential hypertension   [facility] [place] - A Dept Of [place]. [facility] Lawrance Presume, MD   2 years ago Primary hypertension   [facility] [place] - A Dept Of [place]. [facility] Freada Jacobs, Jonathon Neighbors, RPH-CPP               carvedilol  (COREG ) 3.125 MG tablet 60 tablet 3    Sig: Take 1 tablet (3.125 mg total) by mouth 2 (two) times daily with a meal.     Cardiovascular: Beta Blockers 3 Failed - 12/30/2023 12:40 PM      Failed - Valid encounter within last 6 months    Recent Outpatient Visits           1 month ago Postmenopausal vaginal bleeding   [facility] [place], Asencion Blacksmith, DO   1 year ago Upper respiratory infection, acute   [facility] - A Dept Of [place]. [facility] Lawrance Presume, MD   2 years ago Essential hypertension   [facility] - A Dept Of [place]. [facility] Lawrance Presume, MD   2 years ago Essential hypertension   [facility] [place] - A Dept Of [place]  [facility]. [facility] Lawrance Presume, MD   2 years ago Primary hypertension   [facility] [place] - A Dept Of [place]. [facility] [place], Jonathon Neighbors, RPH-CPP              Passed - Cr in normal range and within 360 days    Creat  Date Value Ref Range Status  09/28/2015 0.81 0.50 - 1.10 mg/dL Final   Creatinine, Ser  Date Value Ref Range Status  08/31/2023 0.93 0.57 - 1.00 mg/dL Final         Passed - AST in normal range and within 360 days    AST  Date Value Ref Range Status  08/31/2023 19 0 - 40 IU/L Final   SGOT(AST)  Date Value Ref Range Status  05/28/2012 22 15 - 37 Unit/L Final         Passed - ALT in normal range and within 360 days    ALT  Date Value Ref Range Status  08/31/2023 22 0 - 32 IU/L Final   SGPT (ALT)  Date Value Ref Range Status  05/28/2012 22 12 - 78 U/L Final         Passed - Last BP in normal range    BP Readings from Last 1 Encounters:  12/24/23 96/66         Passed - Last Heart Rate in normal range    Pulse Readings from Last 1 Encounters:  12/24/23 68          amLODipine  (NORVASC ) 10 MG tablet 90 tablet 0    Sig: TAKE 1 TABLET BY MOUTH ONCE DAILY. TO LOWER BLOOD PRESSURE     Cardiovascular: Calcium  Channel Blockers 2 Failed - 12/30/2023 12:40 PM      Failed - Valid encounter within last 6 months    Recent Outpatient Visits           1 month ago Postmenopausal vaginal bleeding   [facility] [place], Asencion Blacksmith, DO   1 year ago Upper respiratory infection, acute   [facility] - A Dept Of [place]. [facility] Lawrance Presume, MD   2 years ago Essential hypertension   [facility] [place] - A Dept Of [place]. [facility] Lawrance Presume, MD   2 years ago Essential hypertension   [facility] [place] - A Dept Of [place]. [facility] Lawrance Presume, MD   2 years ago Primary hypertension    [facility] [place] - A Dept Of [place]. [facility] [place], [place] L, RPH-CPP              Passed - Last BP in normal range    BP Readings from Last 1 Encounters:  12/24/23 96/66         Passed - Last Heart Rate in normal range    Pulse Readings from Last 1 Encounters:  12/24/23 68          valsartan -hydrochlorothiazide  (DIOVAN -HCT) 320-25 MG tablet 90 tablet 0    Sig: Take 1 tablet by mouth daily.     Cardiovascular: ARB + Diuretic Combos Failed - 12/30/2023 12:40 PM      Failed - Valid encounter within last 6 months    Recent Outpatient Visits           1 month ago Postmenopausal vaginal bleeding   [facility] [place], Asencion Blacksmith, DO   1 year ago Upper respiratory infection, acute   [facility] - A Dept Of [place]. [facility] Lawrance Presume, MD   2 years ago Essential hypertension   [facility] - A Dept Of [place]. [facility] Lawrance Presume, MD   2 years ago Essential hypertension   [facility] [place] - A Dept Of [place]. [facility] Lawrance Presume, MD   2 years ago Primary hypertension   [facility] [place] - A Dept Of . [facility] [place], [employer] L, RPH-CPP              Passed - K in normal range and within 180 days    Potassium  Date Value Ref Range Status  08/31/2023 3.9 3.5 - 5.2 mmol/L Final  05/28/2012 3.1 (L) 3.5 - 5.1 mmol/L Final         Passed - Na in normal range and within 180 days    Sodium  Date Value Ref  Range Status  08/31/2023 139 134 - 144 mmol/L Final  05/28/2012 140 136 - 145 mmol/L Final         Passed - Cr in normal range and within 180 days    Creat  Date Value Ref Range Status  09/28/2015 0.81 0.50 - 1.10 mg/dL Final   Creatinine, Ser  Date Value Ref Range Status  08/31/2023 0.93 0.57 - 1.00 mg/dL Final         Passed - eGFR is 10 or above  and within 180 days    GFR, Est African American  Date Value Ref Range Status  09/28/2015 >89 >=60 mL/min Final   GFR calc Af Amer  Date Value Ref Range Status  01/05/2020 82 >59 mL/min/1.73 Final    Comment:    **[facility] currently reports eGFR in compliance with the current**   recommendations of the [employer]. [facility] will   update reporting as new guidelines are published from the NKF-ASN   Task force.    GFR, Est Non African American  Date Value Ref Range Status  09/28/2015 86 >=60 mL/min Final    Comment:      The estimated GFR is a calculation valid for adults (>=35 years old) that uses the CKD-EPI algorithm to adjust for age and sex. It is   not to be used for children, pregnant women, hospitalized patients,    patients on dialysis, or with rapidly changing kidney function. According to the NKDEP, eGFR >89 is normal, 60-89 shows mild impairment, 30-59 shows moderate impairment, 15-29 shows severe impairment and <15 is ESRD.      GFR, Estimated  Date Value Ref Range Status  06/08/2023 >60 >60 mL/min Final    Comment:    (NOTE) Calculated using the CKD-EPI Creatinine Equation (2021)    eGFR  Date Value Ref Range Status  08/31/2023 72 >59 mL/min/1.73 Final         Passed - Patient is not pregnant      Passed - Last BP in normal range    BP Readings from Last 1 Encounters:  12/24/23 96/66

## 2024-01-27 NOTE — Progress Notes (Deleted)
    GYNECOLOGY PROGRESS NOTE  Subjective:  PCP: Carlean Charter, DO  Patient ID: Coletta Davidson, female    DOB: [birth year], 58 y.o.   MRN: [id number]  HPI  Patient is a 58 y.o. G21P3003 female who presents for   {Common ambulatory SmartLinks:19316}  Review of Systems {ros; complete:30496}   Objective:   Last menstrual period 05/08/2016. There is no height or weight on file to calculate BMI.  General appearance: {general exam:16600} Abdomen: {abdominal exam:16834} Pelvic: {pelvic exam:16852::cervix normal in appearance,external genitalia normal,no adnexal masses or tenderness,no cervical motion tenderness,rectovaginal septum normal,uterus normal size, shape, and consistency,vagina normal without discharge} Extremities: {extremity exam:5109} Neurologic: {neuro exam:17854}   Assessment/Plan:   1. Encounter to establish care      1. Encounter to establish care (Primary) ***      Sofia Dunn, DO [facility]

## 2024-01-28 ENCOUNTER — Encounter: Admitting: Obstetrics

## 2024-02-03 ENCOUNTER — Other Ambulatory Visit: Payer: Self-pay

## 2024-02-03 DIAGNOSIS — R0602 Shortness of breath: Secondary | ICD-10-CM

## 2024-02-04 ENCOUNTER — Encounter: Payer: Self-pay | Admitting: Obstetrics and Gynecology

## 2024-02-05 ENCOUNTER — Ambulatory Visit: Admitting: Internal Medicine

## 2024-02-05 DIAGNOSIS — J4540 Moderate persistent asthma, uncomplicated: Secondary | ICD-10-CM

## 2024-02-05 DIAGNOSIS — R0602 Shortness of breath: Secondary | ICD-10-CM

## 2024-02-05 DIAGNOSIS — J4520 Mild intermittent asthma, uncomplicated: Secondary | ICD-10-CM

## 2024-02-05 LAB — PULMONARY FUNCTION TEST
DL/VA % pred: 116 %
DL/VA: 4.93 ml/min/mmHg/L
DLCO unc % pred: 103 %
DLCO unc: 20.98 ml/min/mmHg
FEF 25-75 Pre: 3.17 L/s
FEF2575-%Pred-Pre: 127 %
FEV1-%Pred-Pre: 84 %
FEV1-Pre: 2.24 L
FEV1FVC-%Pred-Pre: 127 %
FEV6-%Pred-Pre: 68 %
FEV6-Pre: 2.24 L
FEV6FVC-%Pred-Pre: 103 %
FVC-%Pred-Pre: 66 %
FVC-Pre: 2.24 L
Pre FEV1/FVC ratio: 100 %
Pre FEV6/FVC Ratio: 100 %
RV % pred: 66 %
RV: 1.28 L
TLC % pred: 83 %
TLC: 4.23 L

## 2024-02-05 MED ORDER — BUDESONIDE-FORMOTEROL FUMARATE 80-4.5 MCG/ACT IN AERO: 2.0000 | INHALATION_SPRAY | Freq: Two times a day (BID) | RESPIRATORY_TRACT | 2 refills | Status: DC

## 2024-02-05 NOTE — Patient Instructions (Signed)
 Full PFT completed without post.

## 2024-02-05 NOTE — Progress Notes (Signed)
 Full PFT completed without post. Sign language interpreter in office for PFT.

## 2024-02-10 ENCOUNTER — Other Ambulatory Visit: Payer: Self-pay | Admitting: Family Medicine

## 2024-02-10 NOTE — Telephone Encounter (Unsigned)
 Copied from CRM [id number]. Topic: Clinical - Medication Refill >> Feb 10, 2024 12:20 PM Nathanel BROCKS wrote: Medication: atorvastatin  (LIPITOR) 10 MG tablet  Has the patient contacted their pharmacy? Yes  This is the patient's preferred pharmacy:  [facility]  [address] [place] [address] Phone: [phone] Fax: [phone]  Is this the correct pharmacy for this prescription? Yes If no, delete pharmacy and type the correct one.   Has the prescription been filled recently? Yes  Is the patient out of the medication? Yes  Has the patient been seen for an appointment in the last year OR does the patient have an upcoming appointment? Yes  Can we respond through MyChart? No  Agent: Please be advised that Rx refills may take up to 3 business days. We ask that you follow-up with your pharmacy.

## 2024-02-11 ENCOUNTER — Ambulatory Visit: Admitting: Internal Medicine

## 2024-02-11 MED ORDER — ATORVASTATIN CALCIUM 10 MG PO TABS: 10.0000 mg | ORAL_TABLET | Freq: Every day | ORAL | 1 refills | Status: DC

## 2024-02-11 NOTE — Telephone Encounter (Signed)
 Requested Prescriptions  Pending Prescriptions Disp Refills   atorvastatin  (LIPITOR) 10 MG tablet 90 tablet 1    Sig: Take 1 tablet (10 mg total) by mouth daily.     Cardiovascular:  Antilipid - Statins Failed - 02/11/2024  3:30 PM      Failed - Valid encounter within last 12 months    Recent Outpatient Visits           2 months ago Postmenopausal vaginal bleeding   [facility] [place], Lauraine SAILOR, DO   1 year ago Upper respiratory infection, acute   [facility] - A Dept Of [place]. [facility] Vicci Barnie NOVAK, MD   2 years ago Essential hypertension   [facility] [place] - A Dept Of [place]. [facility] Vicci Barnie NOVAK, MD   2 years ago Essential hypertension   [facility] - A Dept Of [place]. [facility] Barnie NOVAK, MD   2 years ago Primary hypertension   [facility] [place] - A Dept Of [place]. [facility] Fleeta Morris, [place] L, RPH-CPP              Failed - Lipid Panel in normal range within the last 12 months    Cholesterol, Total  Date Value Ref Range Status  08/31/2023 198 100 - 199 mg/dL Final   LDL Chol Calc (NIH)  Date Value Ref Range Status  08/31/2023 143 (H) 0 - 99 mg/dL Final   HDL  Date Value Ref Range Status  08/31/2023 40 >39 mg/dL Final   Triglycerides  Date Value Ref Range Status  08/31/2023 84 0 - 149 mg/dL Final         Passed - Patient is not pregnant

## 2024-02-14 ENCOUNTER — Other Ambulatory Visit: Payer: Self-pay | Admitting: Internal Medicine

## 2024-02-15 NOTE — Telephone Encounter (Signed)
 Copied from CRM [id number]. Topic: Clinical - Prescription Issue >> Feb 15, 2024 11:02 AM Caitlin [name] wrote: Reason for CRM: atorvastatin  (LIPITOR) 10 MG tablet - still waiting on refill, pharmacy gave 5 pills to hold over, has 4 left- [phone]

## 2024-02-15 NOTE — Telephone Encounter (Signed)
 Spoke with interpreter pt made aware she was given a paper rx for pt to drop off at pharmacy. Pt still had the rx form at home she will drop off today to be able to get medication.

## 2024-02-17 NOTE — Telephone Encounter (Unsigned)
 Copied from CRM [id number]. Topic: Clinical - Medication Refill >> Feb 17, 2024  3:56 PM Tiffini S wrote: Medication: atorvastatin  (LIPITOR) 10 MG tablet  Has the patient contacted their pharmacy? Yes, patient is out of refills  (Agent: If no, request that the patient contact the pharmacy for the refill. If patient does not wish to contact the pharmacy document the reason why and proceed with request.) (Agent: If yes, when and what did the pharmacy advise?)  This is the patient's preferred pharmacy:  [facility] [address] ([address]), [place] - [address] [place]) [place] [address] Phone: [phone] Fax: [phone]  Is this the correct pharmacy for this prescription? Yes If no, delete pharmacy and type the correct one.   Has the prescription been filled recently? No  Is the patient out of the medication? Yes, patient is out of the medication. Take the last tablet today.   Has the patient been seen for an appointment in the last year OR does the patient have an upcoming appointment? Yes  Can we respond through MyChart? No  Agent: Please be advised that Rx refills may take up to 3 business days. We ask that you follow-up with your pharmacy.

## 2024-02-22 ENCOUNTER — Ambulatory Visit: Admitting: Family Medicine

## 2024-02-22 ENCOUNTER — Encounter: Payer: Self-pay | Admitting: Family Medicine

## 2024-02-22 VITALS — BP 133/77 | HR 69 | Resp 14 | Ht 64.0 in | Wt 192.5 lb

## 2024-02-22 DIAGNOSIS — Z7689 Persons encountering health services in other specified circumstances: Secondary | ICD-10-CM | POA: Diagnosis not present

## 2024-02-22 DIAGNOSIS — R519 Headache, unspecified: Secondary | ICD-10-CM

## 2024-02-22 DIAGNOSIS — J302 Other seasonal allergic rhinitis: Secondary | ICD-10-CM | POA: Diagnosis not present

## 2024-02-22 DIAGNOSIS — R7309 Other abnormal glucose: Secondary | ICD-10-CM

## 2024-02-22 DIAGNOSIS — H9313 Tinnitus, bilateral: Secondary | ICD-10-CM | POA: Diagnosis not present

## 2024-02-22 DIAGNOSIS — N182 Chronic kidney disease, stage 2 (mild): Secondary | ICD-10-CM

## 2024-02-22 DIAGNOSIS — J4540 Moderate persistent asthma, uncomplicated: Secondary | ICD-10-CM | POA: Diagnosis not present

## 2024-02-22 DIAGNOSIS — I10 Essential (primary) hypertension: Secondary | ICD-10-CM | POA: Diagnosis not present

## 2024-02-22 DIAGNOSIS — K219 Gastro-esophageal reflux disease without esophagitis: Secondary | ICD-10-CM | POA: Diagnosis not present

## 2024-02-22 DIAGNOSIS — G4733 Obstructive sleep apnea (adult) (pediatric): Secondary | ICD-10-CM | POA: Diagnosis not present

## 2024-02-22 DIAGNOSIS — E782 Mixed hyperlipidemia: Secondary | ICD-10-CM | POA: Diagnosis not present

## 2024-02-22 DIAGNOSIS — E559 Vitamin D deficiency, unspecified: Secondary | ICD-10-CM

## 2024-02-22 DIAGNOSIS — N950 Postmenopausal bleeding: Secondary | ICD-10-CM | POA: Diagnosis not present

## 2024-02-22 MED ORDER — CARVEDILOL 3.125 MG PO TABS: 3.1250 mg | ORAL_TABLET | Freq: Two times a day (BID) | ORAL | 3 refills | Status: DC

## 2024-02-22 MED ORDER — LEVOCETIRIZINE DIHYDROCHLORIDE 5 MG PO TABS
5.0000 mg | ORAL_TABLET | Freq: Every evening | ORAL | 3 refills | Status: DC
Start: 2024-02-22 — End: 2024-05-31

## 2024-02-22 MED ORDER — VALSARTAN-HYDROCHLOROTHIAZIDE 320-25 MG PO TABS: 1.0000 | ORAL_TABLET | Freq: Every day | ORAL | 3 refills | Status: DC

## 2024-02-22 MED ORDER — ATORVASTATIN CALCIUM 10 MG PO TABS
10.0000 mg | ORAL_TABLET | Freq: Every day | ORAL | 3 refills | Status: DC
Start: 2024-02-22 — End: 2024-07-04

## 2024-02-22 MED ORDER — AMLODIPINE BESYLATE 10 MG PO TABS: ORAL_TABLET | ORAL | 3 refills | Status: DC

## 2024-02-22 MED ORDER — ALBUTEROL SULFATE HFA 108 (90 BASE) MCG/ACT IN AERS: 2.0000 | INHALATION_SPRAY | Freq: Four times a day (QID) | RESPIRATORY_TRACT | 6 refills | Status: AC | PRN

## 2024-02-22 MED ORDER — FAMOTIDINE 20 MG PO TABS: 20.0000 mg | ORAL_TABLET | Freq: Every day | ORAL | 3 refills | Status: DC

## 2024-02-22 NOTE — Progress Notes (Unsigned)
 Established patient visit   Patient: Caitlin [name]   DOB: 04-19-1966   58 y.o. Female  MRN: [id number] Visit Date: 02/22/2024  Today's healthcare provider: LAURAINE LOISE BUOY, DO   Chief Complaint  Patient presents with  . Follow-up   Subjective    HPI    Did not see gynecology??? [facility] - pt to call  *** Follow-up Scheduled for re-evaluation and blood work in three months. - Schedule follow-up appointment in three months. - Plan to recheck blood work at follow-up. - Assess weight loss progress and general health - See sleep specialist for sleep study -scheduled in August - Call speech generating device company for follow-up; gave patient multiple members to try to reach them at. ***   Headaches Loud sound in ear - causing ha; occurring almost every day - sometimes with dizziness -    {History (Optional):23778}  Medications: Outpatient Medications Prior to Visit  Medication Sig  . albuterol  (VENTOLIN  HFA) 108 (90 Base) MCG/ACT inhaler Inhale 2 puffs into the lungs every 6 (six) hours as needed for wheezing or shortness of breath.  . amLODipine  (NORVASC ) 10 MG tablet TAKE 1 TABLET BY MOUTH ONCE DAILY. TO LOWER BLOOD PRESSURE  . atorvastatin  (LIPITOR) 10 MG tablet Take 1 tablet (10 mg total) by mouth daily.  . Blood Pressure Monitor KIT Use to check blood pressure daily  . budesonide -formoterol  (SYMBICORT ) 80-4.5 MCG/ACT inhaler Inhale 2 puffs into the lungs in the morning and at bedtime.  . buPROPion  (WELLBUTRIN  XL) 150 MG 24 hr tablet Take 1 tablet (150 mg total) by mouth daily. Take 1 tablet daily for 3 days, then take 2 tablets daily  . carvedilol  (COREG ) 3.125 MG tablet Take 1 tablet (3.125 mg total) by mouth 2 (two) times daily with a meal.  . famotidine  (PEPCID ) 20 MG tablet Take 1 tablet (20 mg total) by mouth daily before breakfast.  . levocetirizine (XYZAL ) 5 MG tablet Take 1 tablet (5 mg total) by mouth every evening.  . Minoxidil  5 % FOAM  Apply 1 Application topically 2 (two) times daily.  . Misc. Devices MISC CPAP therapy on 17 cm H2O . Patient to use X-Small size Resmed Full Face Mask AirFit F10. Diagnosis - Obstructive sleep apnea  . naltrexone  (DEPADE) 50 MG tablet Take 0.5 tablets (25 mg total) by mouth daily.  . nystatin  (MYCOSTATIN /NYSTOP ) powder Apply 1 application topically 2 (two) times daily. Apply to affected area under breast  . Olopatadine  HCl 0.2 % SOLN Apply 1 drop to eye daily as needed (itchy/watery eyes).  . omeprazole  (PRILOSEC  OTC) 20 MG tablet Take 2 tablets (40 mg total) by mouth daily for 14 days.  . traZODone  (DESYREL ) 50 MG tablet Take 0.5-1 tablets (25-50 mg total) by mouth at bedtime as needed for sleep.  . valsartan -hydrochlorothiazide  (DIOVAN -HCT) 320-25 MG tablet Take 1 tablet by mouth daily.  . Vitamin D , Ergocalciferol , (DRISDOL ) 1.25 MG (50000 UNIT) CAPS capsule Take 1 capsule (50,000 Units total) by mouth every 7 (seven) days.   No facility-administered medications prior to visit.    Review of Systems ***  {Insert previous labs (optional):23779} {See past labs  Heme  Chem  Endocrine  Serology  Results Review (optional):1}   Objective    BP 133/77 (BP Location: Right Arm, Patient Position: Sitting, Cuff Size: Normal)   Pulse 69   Resp 14   Ht 5' 4 (1.626 m)   Wt 192 lb 8 oz (87.3 kg)   LMP 05/08/2016  SpO2 98%   BMI 33.04 kg/m  {Insert last BP/Wt (optional):23777}{See vitals history (optional):1}   Physical Exam Vitals reviewed.  Constitutional:      General: She is not in acute distress.    Appearance: Normal appearance. She is well-developed. She is not diaphoretic.  HENT:     Head: Normocephalic and atraumatic.     Right Ear: Tympanic membrane, ear canal and external ear normal.     Left Ear: Tympanic membrane, ear canal and external ear normal.  Eyes:     General: No scleral icterus.    Conjunctiva/sclera: Conjunctivae normal.     Pupils: Pupils are equal, round,  and reactive to light.  Cardiovascular:     Rate and Rhythm: Normal rate and regular rhythm.     Pulses: Normal pulses.     Heart sounds: Normal heart sounds. No murmur heard. Pulmonary:     Effort: Pulmonary effort is normal. No respiratory distress.     Breath sounds: Normal breath sounds. No wheezing or rales.  Musculoskeletal:     Cervical back: Neck supple.     Right lower leg: No edema.     Left lower leg: No edema.  Lymphadenopathy:     Cervical: No cervical adenopathy.  Skin:    General: Skin is warm and dry.     Findings: No rash.  Neurological:     Mental Status: She is alert.      No results found for any visits on 02/22/24.  Assessment & Plan    Mixed hyperlipidemia  Essential hypertension  Moderate persistent asthma without complication  Gastroesophageal reflux disease, unspecified whether esophagitis present    ***  No follow-ups on file.      I discussed the assessment and treatment plan with the patient  The patient was provided an opportunity to ask questions and all were answered. The patient agreed with the plan and demonstrated an understanding of the instructions.   The patient was advised to call back or seek an in-person evaluation if the symptoms worsen or if the condition fails to improve as anticipated.    LAURAINE LOISE BUOY, DO  [facility] [phone] (phone) [phone] (fax)  [facility]

## 2024-02-23 ENCOUNTER — Ambulatory Visit: Payer: Self-pay | Admitting: Family Medicine

## 2024-02-23 DIAGNOSIS — J302 Other seasonal allergic rhinitis: Secondary | ICD-10-CM | POA: Insufficient documentation

## 2024-02-23 LAB — COMPREHENSIVE METABOLIC PANEL WITH GFR
ALT: 18 IU/L (ref 0–32)
AST: 17 IU/L (ref 0–40)
Albumin: 4.6 g/dL (ref 3.8–4.9)
Alkaline Phosphatase: 97 IU/L (ref 44–121)
BUN/Creatinine Ratio: 15 (ref 9–23)
BUN: 14 mg/dL (ref 6–24)
Bilirubin Total: 0.5 mg/dL (ref 0.0–1.2)
CO2: 24 mmol/L (ref 20–29)
Calcium: 9.6 mg/dL (ref 8.7–10.2)
Chloride: 100 mmol/L (ref 96–106)
Creatinine, Ser: 0.96 mg/dL (ref 0.57–1.00)
Globulin, Total: 3.4 g/dL (ref 1.5–4.5)
Glucose: 114 mg/dL — ABNORMAL HIGH (ref 70–99)
Potassium: 3.9 mmol/L (ref 3.5–5.2)
Sodium: 139 mmol/L (ref 134–144)
Total Protein: 8 g/dL (ref 6.0–8.5)
eGFR: 69 mL/min/1.73 (ref >59)

## 2024-02-23 LAB — MICROALBUMIN / CREATININE URINE RATIO
Creatinine, Urine: 52.5 mg/dL
Microalb/Creat Ratio: 9 mg/g{creat} (ref 0–29)
Microalbumin, Urine: 4.9 ug/mL

## 2024-02-23 LAB — HEMOGLOBIN A1C
Est. average glucose Bld gHb Est-mCnc: 123 mg/dL
Hgb A1c MFr Bld: 5.9 % — ABNORMAL HIGH (ref 4.8–5.6)

## 2024-02-23 LAB — LIPID PANEL
Chol/HDL Ratio: 4.3 ratio (ref 0.0–4.4)
Cholesterol, Total: 152 mg/dL (ref 100–199)
HDL: 35 mg/dL — ABNORMAL LOW (ref >39)
LDL Chol Calc (NIH): 96 mg/dL (ref 0–99)
Triglycerides: 117 mg/dL (ref 0–149)
VLDL Cholesterol Cal: 21 mg/dL (ref 5–40)

## 2024-02-24 ENCOUNTER — Telehealth: Payer: Self-pay

## 2024-02-24 NOTE — Telephone Encounter (Signed)
 SABRA

## 2024-02-26 ENCOUNTER — Ambulatory Visit: Admitting: Family Medicine

## 2024-03-03 ENCOUNTER — Telehealth: Payer: Self-pay

## 2024-03-03 ENCOUNTER — Other Ambulatory Visit (HOSPITAL_COMMUNITY): Payer: Self-pay

## 2024-03-03 NOTE — Telephone Encounter (Signed)
 Pharmacy Patient Advocate Encounter   Received notification from CoverMyMeds that prior authorization for Budesonide -Formoterol  Fumarate 80-4.5MCG/ACT aerosol  is required/requested.   Insurance verification completed.   The patient is insured through [facility] .   Per test claim:  Brand Symbicort  is preferred by the insurance.  If suggested medication is appropriate, Please send in a new RX and discontinue this one. If not, please advise as to why it's not appropriate so that we may request a Prior Authorization. Please note, some preferred medications may still require a PA.  If the suggested medications have not been trialed and there are no contraindications to their use, the PA will not be submitted, as it will not be approved.

## 2024-04-12 ENCOUNTER — Ambulatory Visit: Admitting: Internal Medicine

## 2024-04-12 NOTE — Assessment & Plan Note (Deleted)
 Well-controlled Continue to take Symbicort  2 puffs twice daily Recommend rinsing mouth out after use Continue to use albuterol  as needed Avoid Allergens and Irritants Avoid secondhand smoke Avoid SICK contacts Recommend  Masking  when appropriate Recommend Keep up-to-date with vaccinations PFT reviewed with patient today FEV/FVC ratio WNL FEV 1 84%predicted FVC 66% predicted TLC 83% predicted RV/TLC ratio WNL DLCO 103% predcited FLow Volume loops restrictive pattern Final interpretation-consider small reactive airways disease , restrictive lung disease likely due to obesity

## 2024-04-12 NOTE — Progress Notes (Deleted)
 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 and teeth pain  Patient is a non-smoker Nonalcoholic No secondhand smoke exposure  Patient diagnosed with asthma 5 years ago Seems to be controlled at this time however she is dependent on Symbicort  inhaler Uses albuterol  infrequently  No exacerbation at this time No evidence of heart failure at this time No evidence or signs of infection at this time No respiratory distress No fevers, chills, nausea, vomiting, diarrhea No evidence of lower extremity edema No evidence hemoptysis    EPWORTH SLEEP SCORE    10/29/2023    9:59 AM  Results of the Epworth flowsheet  Sitting and reading 2  Watching TV 2  Sitting, inactive in a public place (e.g. a theatre or a meeting) 0  As a passenger in a car for an hour without a break 0  Lying down to rest in the afternoon when circumstances permit 2  Sitting and talking to someone 2  Sitting quietly after a lunch without alcohol 3  In a car, while stopped for a few minutes in traffic 2  Total score 13      PAST MEDICAL HISTORY :   has a past medical history of Asthma, Deaf, GERD (gastroesophageal reflux disease), Hypertension (2011), NO (nasal obstruction), and OSA (obstructive sleep apnea).  has a past surgical history that includes Cholecystectomy; Cesarean section; and Tubal ligation. Prior to Admission medications   Medication Sig Start Date End Date Taking? Authorizing Provider  albuterol  (VENTOLIN  HFA) 108 (90 Base) MCG/ACT inhaler Inhale 2 puffs into the lungs every 6 (six) hours as needed for wheezing or shortness of breath. 04/22/23   Donzella Lauraine SAILOR, DO   amLODipine  (NORVASC ) 10 MG tablet TAKE 1 TABLET BY MOUTH ONCE DAILY TO LOWER BLOOD PRESSURE 09/28/23   Donzella Lauraine SAILOR, DO  atorvastatin  (LIPITOR) 10 MG tablet Take 1 tablet by mouth once daily 12/19/22   Vicci Barnie NOVAK, MD  Blood Pressure Monitor KIT Use to check blood pressure daily 10/27/19   Fulp, Cammie, MD  budesonide -formoterol  (SYMBICORT ) 80-4.5 MCG/ACT inhaler Inhale 2 puffs into the lungs in the morning and at bedtime. 04/22/23   Donzella Lauraine SAILOR, DO  buPROPion  (WELLBUTRIN  XL) 150 MG 24 hr tablet Take 1 tablet (150 mg total) by mouth daily. Take 1 tablet daily for 3 days, then take 2 tablets daily Patient not taking: Reported on 09/23/2023 08/27/23   Donzella Lauraine SAILOR, DO  carvedilol  (COREG ) 3.125 MG tablet Take 1 tablet (3.125 mg total) by mouth 2 (two) times daily with a meal. 04/22/23   Pardue, Lauraine SAILOR, DO  famotidine  (PEPCID ) 20 MG tablet Take 1 tablet (20 mg total) by mouth daily before breakfast. 04/22/23   Pardue, Lauraine SAILOR, DO  levocetirizine (XYZAL ) 5 MG tablet Take 1 tablet (5 mg total) by mouth every evening. Patient not taking: Reported on 09/23/2023 12/12/21   Jeneal Danita Macintosh, MD  Minoxidil  5 % FOAM Apply 1 Application topically 2 (two) times daily. Patient not taking: Reported on 09/23/2023 04/22/23   Donzella Lauraine SAILOR, DO  Misc. Devices MISC CPAP therapy on 17 cm H2O . Patient to use X-Small size  Resmed Full Face Mask AirFit F10. Diagnosis - Obstructive sleep apnea Patient not taking: Reported on 09/23/2023 01/25/20   Newlin, Enobong, MD  naltrexone  (DEPADE) 50 MG tablet Take 0.5 tablets (25 mg total) by mouth daily. Patient not taking: Reported on 09/23/2023 08/27/23   Donzella Lauraine SAILOR, DO  naproxen  (NAPROSYN ) 500 MG tablet Take 1 tablet (500 mg total) by mouth 2 (two) times daily with a meal. Patient not taking: Reported on 09/23/2023 01/11/23 01/11/24  Lang Dover, MD  nystatin  (MYCOSTATIN /NYSTOP ) powder Apply 1 application topically 2 (two) times daily. Apply to affected area under  breast Patient not taking: Reported on 09/23/2023 04/25/21   Vicci Barnie NOVAK, MD  Olopatadine  HCl 0.2 % SOLN Apply 1 drop to eye daily as needed (itchy/watery eyes). Patient not taking: Reported on 09/23/2023 12/12/21   Jeneal Danita Macintosh, MD  omeprazole  (PRILOSEC  OTC) 20 MG tablet Take 2 tablets (40 mg total) by mouth daily for 14 days. Patient not taking: Reported on 09/23/2023 01/28/23 02/11/23  Woods, Jaclyn M, PA-C  traZODone  (DESYREL ) 50 MG tablet Take 0.5-1 tablets (25-50 mg total) by mouth at bedtime as needed for sleep. Patient not taking: Reported on 09/23/2023 10/27/19   Alec House, MD  valsartan -hydrochlorothiazide  (DIOVAN -HCT) 320-25 MG tablet Take 1 tablet by mouth once daily 09/28/23   Pardue, Sarah N, DO  Vitamin D , Ergocalciferol , (DRISDOL ) 1.25 MG (50000 UNIT) CAPS capsule Take 1 capsule (50,000 Units total) by mouth every 7 (seven) days. 09/05/23   Donzella Lauraine SAILOR, DO   Allergies  Allergen Reactions   Egg-Derived Products Nausea And Vomiting   Milk-Related Compounds     FAMILY HISTORY:  family history includes Heart disease in her mother; Stroke in her mother. SOCIAL HISTORY:  reports that she has never smoked. She has never used smokeless tobacco. She reports current alcohol use of about 1.0 standard drink of alcohol per week. She reports that she does not currently use drugs after having used the following drugs: Crack cocaine.     Review of Systems: Gen:  Denies  fever, sweats, chills weight loss  HEENT: Denies blurred vision, double vision, ear pain, eye pain, hearing loss, nose bleeds, sore throat Cardiac:  No dizziness, chest pain or heaviness, chest tightness,edema, No JVD Resp:   No cough, -sputum production, -shortness of breath,-wheezing, -hemoptysis,  Other:  All other systems negative   Physical Examination:   General Appearance: No distress  EYES PERRLA, EOM intact.   NECK Supple, No JVD Pulmonary: normal breath sounds, No wheezing.   CardiovascularNormal S1,S2.  No m/r/g.   Abdomen: Benign, Soft, non-tender. Neurology UE/LE 5/5 strength, no focal deficits Ext pulses intact, cap refill intact ALL OTHER ROS ARE NEGATIVE  ASSESSMENT AND PLAN SYNOPSIS 58 year old pleasant female seen today for assessment for sleep apnea and has actually diagnosis of severe sleep apnea with AHI of 34 in the setting of underlying reactive airways disease with asthma  Assessment & Plan Moderate persistent asthma without complication Well-controlled Continue to take Symbicort  2 puffs twice daily Recommend rinsing mouth out after use Continue to use albuterol  as needed Avoid Allergens and Irritants Avoid secondhand smoke Avoid SICK contacts Recommend  Masking  when appropriate Recommend Keep up-to-date with vaccinations PFT reviewed with patient today FEV/FVC ratio WNL FEV 1 84%predicted FVC 66% predicted TLC 83% predicted RV/TLC ratio WNL DLCO 103% predcited FLow Volume loops restrictive pattern Final interpretation-consider small reactive airways disease , 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 All questions answered   Follow up    I spent a total of *** minutes dedicated to the care of this patient on the date of this encounter to include pre-visit review of records, face-to-face time with the patient discussing conditions above, post visit ordering of testing, clinical documentation with the electronic health record, making appropriate referrals as documented, and communicating  necessary information to the patient's healthcare team.    The Patient requires high complexity decision making for assessment and support, frequent evaluation and titration of therapies, application of advanced monitoring technologies and extensive interpretation of multiple databases.  Patient satisfied with Plan of action and management. All questions answered    Nickolas Alm Cellar, M.D.  [facility]  Medical Director [facility] Medical Director [facility] Cardio-Pulmonary Department

## 2024-04-18 HISTORY — PX: DENTAL SURGERY: SHX609

## 2024-04-19 ENCOUNTER — Ambulatory Visit: Payer: Self-pay | Admitting: Internal Medicine

## 2024-05-24 ENCOUNTER — Ambulatory Visit: Admitting: Family Medicine

## 2024-05-27 ENCOUNTER — Other Ambulatory Visit: Payer: Self-pay | Admitting: Family Medicine

## 2024-05-27 DIAGNOSIS — Z1231 Encounter for screening mammogram for malignant neoplasm of breast: Secondary | ICD-10-CM

## 2024-05-29 ENCOUNTER — Emergency Department
Admission: EM | Admit: 2024-05-29 | Discharge: 2024-05-29 | Disposition: A | Discharge status: Home / Self Care | Attending: Emergency Medicine | Admitting: Emergency Medicine

## 2024-05-29 ENCOUNTER — Encounter: Payer: Self-pay | Admitting: Intensive Care

## 2024-05-29 ENCOUNTER — Other Ambulatory Visit: Payer: Self-pay

## 2024-05-29 DIAGNOSIS — N644 Mastodynia: Secondary | ICD-10-CM | POA: Insufficient documentation

## 2024-05-29 DIAGNOSIS — I10 Essential (primary) hypertension: Secondary | ICD-10-CM | POA: Insufficient documentation

## 2024-05-29 NOTE — Discharge Instructions (Addendum)
 You can take 650 mg of Tylenol  and 600 mg of ibuprofen every 6 hours as needed for pain. You can use ice, heat, muscle creams and other topical pain relievers as well.  Please follow up with the breast specialist below for a repeat mammogram. I also encourage you to see your OBGYN for further evaluation of the vaginal bleeding you mentioned.   [facility] [phone] [address], [place], [place] [address] Please call to schedule a follow up appointment.

## 2024-05-29 NOTE — ED Provider Notes (Signed)
 [facility] Provider Note    Event Date/Time   First MD Initiated Contact with Patient 05/29/24 1458     (approximate)   History   Breast Pain   HPI  Caitlin [name] is a 58 y.o. female with PMH of hypertension, deaf, OSA and GERD who presents for evaluation of right breast pain.  Patient reports she was not feeling well last week and had viral symptoms including cough, congestion and some nausea and vomiting.  She also describes having a menstrual cycle that lasted for 3 days over a week ago which is abnormal for her as she is postmenopausal. She noticed an area of tenderness in her breast a few days ago. She can feel a lump.       Physical Exam   Triage Vital Signs: ED Triage Vitals  Encounter Vitals Group     BP 05/29/24 1433 (!) 153/77     Girls Systolic BP Percentile --      Girls Diastolic BP Percentile --      Boys Systolic BP Percentile --      Boys Diastolic BP Percentile --      Pulse Rate 05/29/24 1433 84     Resp 05/29/24 1433 17     Temp 05/29/24 1433 98.2 F (36.8 C)     Temp Source 05/29/24 1433 Oral     SpO2 05/29/24 1433 97 %     Weight 05/29/24 1436 190 lb (86.2 kg)     Height 05/29/24 1436 5' 4 (1.626 m)     Head Circumference --      Peak Flow --      Pain Score 05/29/24 1436 7     Pain Loc --      Pain Education --      Exclude from Growth Chart --     Most recent vital signs: Vitals:   05/29/24 1433  BP: (!) 153/77  Pulse: 84  Resp: 17  Temp: 98.2 F (36.8 C)  SpO2: 97%   General: Awake, no distress.  CV:  Good peripheral perfusion.  Resp:  Normal effort.  Abd:  No distention.  Other:  Round, well circumscribed and mobile nodule in the right upper breast near the axilla about 2 cm in diameter that is tender to palpation, no overlying skin changes, erythema, induration or fluctuance.   ED Results / Procedures / Treatments   Labs (all labs ordered are listed, but only abnormal results are  displayed) Labs Reviewed - No data to display   PROCEDURES:  Critical Care performed: No  Procedures   MEDICATIONS ORDERED IN ED: Medications - No data to display   IMPRESSION / MDM / ASSESSMENT AND PLAN / ED COURSE  I reviewed the triage vital signs and the nursing notes.                             58 year old female presents for evaluation of right breast pain. BP is elevated, VSS otherwise. Patient NAD on exam.   Differential diagnosis includes, but is not limited to, fibrocystic breast changes, breast cancer, lymphadenopathy, cellulitis.  Patient's presentation is most consistent with acute, uncomplicated illness.  On exam patient has a round, well-circumscribed and mobile nodule in the right upper breast close to the axilla which I feel is most consistent with a lymph node.  This may be in response to her recent illness.  Did advise patient to please  follow-up with a breast specialist for a mammogram to confirm.  Patient's last mammogram was 2 years ago and was normal.  In regards to the 3 days of bleeding advised patient that she needs to follow-up with her OB/GYN for further testing as I am concerned about endometrial cancer.  Patient has an established OB/GYN that she plans to reach out to to schedule an appointment.  Patient is not having any bleeding symptoms at this time so we will hold off on further workup of this.  Recommended Tylenol  and ibuprofen as needed for treatment of the breast pain.  Patient voiced understanding, all questions were answered and she is stable at discharge.     FINAL CLINICAL IMPRESSION(S) / ED DIAGNOSES   Final diagnoses:  Breast pain, right     Rx / DC Orders   ED Discharge Orders     None        Note:  This document was prepared using Dragon voice recognition software and may include unintentional dictation errors.   Cleaster Tinnie LABOR, PA-C 05/29/24 1533    Floy Roberts, MD 05/29/24 [phone]

## 2024-05-29 NOTE — ED Triage Notes (Signed)
 Sign language interpretor on a stick used for triage.  Patient c/o small area to right of breast that is tender to touch and feels like a tiny lump. Present X1 week

## 2024-05-30 ENCOUNTER — Telehealth: Payer: Self-pay | Admitting: Family Medicine

## 2024-05-30 NOTE — Telephone Encounter (Signed)
 Copied from CRM [id number]. Topic: Referral - Request for Referral >> May 30, 2024 11:01 AM Olam RAMAN wrote: Did the patient discuss referral with their provider in the last year? No (If No - schedule appointment) (If Yes - send message)  Appointment offered? Yes  Type of order/referral and detailed reason for visit: mammogram  Preference of office, provider, location: [facility]  If referral order, have you been seen by this specialty before? No (If Yes, this issue or another issue? When? Where?  Can we respond through MyChart? Yes

## 2024-05-31 ENCOUNTER — Inpatient Hospital Stay: Admitting: Family Medicine

## 2024-05-31 ENCOUNTER — Other Ambulatory Visit: Payer: Self-pay

## 2024-05-31 ENCOUNTER — Ambulatory Visit
Admission: RE | Admit: 2024-05-31 | Discharge: 2024-05-31 | Disposition: A | Discharge status: Home / Self Care | Source: Ambulatory Visit | Attending: Family Medicine | Admitting: Family Medicine

## 2024-05-31 ENCOUNTER — Encounter: Payer: Self-pay | Admitting: Family Medicine

## 2024-05-31 ENCOUNTER — Ambulatory Visit: Admitting: Family Medicine

## 2024-05-31 VITALS — BP 128/68 | HR 67 | Temp 98.2°F | Ht 64.0 in | Wt 192.3 lb

## 2024-05-31 DIAGNOSIS — I10 Essential (primary) hypertension: Secondary | ICD-10-CM

## 2024-05-31 DIAGNOSIS — N950 Postmenopausal bleeding: Secondary | ICD-10-CM | POA: Insufficient documentation

## 2024-05-31 DIAGNOSIS — R509 Fever, unspecified: Secondary | ICD-10-CM | POA: Diagnosis not present

## 2024-05-31 DIAGNOSIS — N6311 Unspecified lump in the right breast, upper outer quadrant: Secondary | ICD-10-CM | POA: Diagnosis not present

## 2024-05-31 DIAGNOSIS — Z1231 Encounter for screening mammogram for malignant neoplasm of breast: Secondary | ICD-10-CM | POA: Diagnosis not present

## 2024-05-31 DIAGNOSIS — R5381 Other malaise: Secondary | ICD-10-CM | POA: Insufficient documentation

## 2024-05-31 DIAGNOSIS — J302 Other seasonal allergic rhinitis: Secondary | ICD-10-CM | POA: Diagnosis not present

## 2024-05-31 DIAGNOSIS — Z23 Encounter for immunization: Secondary | ICD-10-CM

## 2024-05-31 MED ORDER — LEVOCETIRIZINE DIHYDROCHLORIDE 5 MG PO TABS: 5.0000 mg | ORAL_TABLET | Freq: Every evening | ORAL | 3 refills | Status: AC

## 2024-05-31 NOTE — Addendum Note (Signed)
 Addended by: DONZELLA DOMINO on: 05/31/2024 02:17 PM   Modules accepted: Orders

## 2024-05-31 NOTE — Telephone Encounter (Signed)
 Patient advised.

## 2024-05-31 NOTE — Assessment & Plan Note (Signed)
 Chronic, stable.  - continue amlodipine  10 mg daily - continue carvedilol  3.125 mg twice daily - continue valsartan -hydrochlorothiazide  320-25 mg daily

## 2024-05-31 NOTE — Progress Notes (Signed)
 Established patient visit   Patient: Caitlin [name]   DOB: [birth year]   58 y.o. Female  MRN: [id number] Visit Date: 05/31/2024  Today's healthcare provider: LAURAINE LOISE BUOY, DO   Chief Complaint  Patient presents with   Medical Management of Chronic Issues    Patient is here today stating she is here for a blood pressure check, weight check, and needs referral for a mammogram diagnostic.  Stated that she has a lump in her right breast and she ws informed that she needed a diagnostic mammogram.  State that the lump came in about a week ago.  Sometimes feels weird also had a horrible period with cramps and not sure if she is anxious due to finding the lump.  Flu vaccine- yes  Other Vaccines- wants to discuss   Subjective    HPI Caitlin [name] is a 58 year old female who presents with a breast lump and irregular menstrual bleeding.  She discovered a painful lump in the upper outer quadrant of her right breast approximately one week ago. There is no nipple discharge or drainage.  She reports ongoing vaginal bleeding despite being postmenopausal.  This originally started in April April; however, she did not follow up due to scheduling conflicts with her daughter.  She experiences systemic symptoms including nausea, fever, chills, dizziness, and a sensation of her body feeling 'weird' and 'shaky' after eating. These symptoms have been intermittent over the past two weeks, with some improvement recently. She also reports occasional chest pain and shortness of breath, as well as dizziness and lightheadedness. She has not been monitoring her temperature at home due to not having a thermometer. Her daughter, a home health nurse, has been unable to assist due to her work schedule.  She requests a refill of her allergy medication and is due for a flu shot. She also wants her blood pressure checked, as it was previously high during an ER visit.       Medications: Outpatient  Medications Prior to Visit  Medication Sig   albuterol  (VENTOLIN  HFA) 108 (90 Base) MCG/ACT inhaler Inhale 2 puffs into the lungs every 6 (six) hours as needed for wheezing or shortness of breath.   amLODipine  (NORVASC ) 10 MG tablet TAKE 1 TABLET BY MOUTH ONCE DAILY. TO LOWER BLOOD PRESSURE   atorvastatin  (LIPITOR) 10 MG tablet Take 1 tablet (10 mg total) by mouth daily.   Blood Pressure Monitor KIT Use to check blood pressure daily   budesonide -formoterol  (SYMBICORT ) 80-4.5 MCG/ACT inhaler Inhale 2 puffs into the lungs in the morning and at bedtime.   buPROPion  (WELLBUTRIN  XL) 150 MG 24 hr tablet Take 1 tablet (150 mg total) by mouth daily. Take 1 tablet daily for 3 days, then take 2 tablets daily   carvedilol  (COREG ) 3.125 MG tablet Take 1 tablet (3.125 mg total) by mouth 2 (two) times daily with a meal.   famotidine  (PEPCID ) 20 MG tablet Take 1 tablet (20 mg total) by mouth daily before breakfast.   Minoxidil  5 % FOAM Apply 1 Application topically 2 (two) times daily.   Misc. Devices MISC CPAP therapy on 17 cm H2O . Patient to use X-Small size Resmed Full Face Mask AirFit F10. Diagnosis - Obstructive sleep apnea   naltrexone  (DEPADE) 50 MG tablet Take 0.5 tablets (25 mg total) by mouth daily.   nystatin  (MYCOSTATIN /NYSTOP ) powder Apply 1 application topically 2 (two) times daily. Apply to affected area under breast   Olopatadine  HCl 0.2 %  SOLN Apply 1 drop to eye daily as needed (itchy/watery eyes).   omeprazole  (PRILOSEC  OTC) 20 MG tablet Take 2 tablets (40 mg total) by mouth daily for 14 days.   traZODone  (DESYREL ) 50 MG tablet Take 0.5-1 tablets (25-50 mg total) by mouth at bedtime as needed for sleep.   valsartan -hydrochlorothiazide  (DIOVAN -HCT) 320-25 MG tablet Take 1 tablet by mouth daily.   Vitamin D , Ergocalciferol , (DRISDOL ) 1.25 MG (50000 UNIT) CAPS capsule Take 1 capsule (50,000 Units total) by mouth every 7 (seven) days.   [DISCONTINUED] levocetirizine (XYZAL ) 5 MG tablet Take 1  tablet (5 mg total) by mouth every evening.   No facility-administered medications prior to visit.    Review of Systems  Constitutional:  Positive for chills and fever.  Respiratory:  Positive for shortness of breath (slight).   Cardiovascular:  Negative for chest pain.  Gastrointestinal:  Positive for nausea. Negative for vomiting.  Genitourinary:  Positive for vaginal bleeding (abnormal, with severe cramping).        Objective    BP 128/68 (BP Location: Left Arm, Patient Position: Sitting, Cuff Size: Normal)   Pulse 67   Temp 98.2 F (36.8 C) (Oral)   Ht 5' 4 (1.626 m)   Wt 192 lb 4.8 oz (87.2 kg)   LMP 05/08/2016   SpO2 100%   BMI 33.01 kg/m     Physical Exam Vitals and nursing note reviewed.  Constitutional:      General: She is not in acute distress.    Appearance: Normal appearance.  HENT:     Head: Normocephalic and atraumatic.  Eyes:     General: No scleral icterus.    Conjunctiva/sclera: Conjunctivae normal.  Cardiovascular:     Rate and Rhythm: Normal rate.  Pulmonary:     Effort: Pulmonary effort is normal.  Chest:       Comments: Tender lump to upper outer quadrant as noted. Neurological:     Mental Status: She is alert and oriented to person, place, and time. Mental status is at baseline.  Psychiatric:        Mood and Affect: Mood normal.        Behavior: Behavior normal.      No results found for any visits on 05/31/24.  Assessment & Plan    Lump in upper outer quadrant of right breast -     MM Digital Diagnostic Unilat R; Future -     US  BREAST COMPLETE UNI RIGHT INC AXILLA; Future  Postmenopausal bleeding -     US  PELVIC COMPLETE WITH TRANSVAGINAL; Future  Fever and chills -     US  BREAST COMPLETE UNI RIGHT INC AXILLA; Future -     CBC with Differential/Platelet -     Comprehensive metabolic panel with GFR -     US  PELVIC COMPLETE WITH TRANSVAGINAL; Future  Malaise -     CBC with Differential/Platelet -     Comprehensive  metabolic panel with GFR -     US  PELVIC COMPLETE WITH TRANSVAGINAL; Future  Encounter for screening mammogram for breast cancer -     Digital Screening Mammogram, Left; Future  Seasonal allergies -     Levocetirizine Dihydrochloride ; Take 1 tablet (5 mg total) by mouth every evening.  Dispense: 90 tablet; Refill: 3  Essential hypertension Assessment & Plan: Chronic, stable.  - continue amlodipine  10 mg daily - continue carvedilol  3.125 mg twice daily - continue valsartan -hydrochlorothiazide  320-25 mg daily      Lump in upper outer quadrant of  right breast; fever and chills; malaise Palpable extremely tender lump in the right breast, upper outer quadrant, with ongoing malaise and recent fever/chills.  Vital signs stable today.   - Order ultrasound of right breast including axilla as noted. - Order diagnostic mammogram and breast ultrasound. - Order CMP and CBC to evaluate for potential infection  Postmenopausal bleeding Abnormal uterine bleeding ongoing for the past 6 months.  Patient unable to obtain previous ultrasound due to transportation difficulties.  Will reorder today as patient is able to go directly over. - Order pelvic ultrasound as noted.  Seasonal allergic rhinitis Requires allergy medication refill. - Refill allergy medication.  General Health Maintenance Due for screening mammogram and flu vaccination. - Administer flu shot. - Order screening mammogram.   Return if symptoms worsen or fail to improve.      I discussed the assessment and treatment plan with the patient  The patient was provided an opportunity to ask questions and all were answered. The patient agreed with the plan and demonstrated an understanding of the instructions.   The patient was advised to call back or seek an in-person evaluation if the symptoms worsen or if the condition fails to improve as anticipated.    LAURAINE LOISE BUOY, DO  [facility] [phone]  (phone) [phone] (fax)  [facility]

## 2024-05-31 NOTE — Addendum Note (Signed)
 Addended by: TERREL POWELL CROME on: 05/31/2024 01:54 PM   Modules accepted: Orders

## 2024-06-01 LAB — CBC WITH DIFFERENTIAL/PLATELET
Basophils Absolute: 0 x10E3/uL (ref 0.0–0.2)
Basos: 0 %
EOS (ABSOLUTE): 0.1 x10E3/uL (ref 0.0–0.4)
Eos: 2 %
Hematocrit: 36.5 % (ref 34.0–46.6)
Hemoglobin: 11.9 g/dL (ref 11.1–15.9)
Immature Grans (Abs): 0 x10E3/uL (ref 0.0–0.1)
Immature Granulocytes: 0 %
Lymphocytes Absolute: 1.9 x10E3/uL (ref 0.7–3.1)
Lymphs: 35 %
MCH: 29.5 pg (ref 26.6–33.0)
MCHC: 32.6 g/dL (ref 31.5–35.7)
MCV: 91 fL (ref 79–97)
Monocytes Absolute: 0.5 x10E3/uL (ref 0.1–0.9)
Monocytes: 9 %
Neutrophils Absolute: 2.9 x10E3/uL (ref 1.4–7.0)
Neutrophils: 54 %
Platelets: 440 x10E3/uL (ref 150–450)
RBC: 4.03 x10E6/uL (ref 3.77–5.28)
RDW: 12.3 % (ref 11.7–15.4)
WBC: 5.4 x10E3/uL (ref 3.4–10.8)

## 2024-06-01 LAB — COMPREHENSIVE METABOLIC PANEL WITH GFR
ALT: 18 IU/L (ref 0–32)
AST: 18 IU/L (ref 0–40)
Albumin: 4.5 g/dL (ref 3.8–4.9)
Alkaline Phosphatase: 92 IU/L (ref 49–135)
BUN/Creatinine Ratio: 15 (ref 9–23)
BUN: 13 mg/dL (ref 6–24)
Bilirubin Total: 0.4 mg/dL (ref 0.0–1.2)
CO2: 25 mmol/L (ref 20–29)
Calcium: 9.7 mg/dL (ref 8.7–10.2)
Chloride: 100 mmol/L (ref 96–106)
Creatinine, Ser: 0.89 mg/dL (ref 0.57–1.00)
Globulin, Total: 3.2 g/dL (ref 1.5–4.5)
Glucose: 91 mg/dL (ref 70–99)
Potassium: 3.8 mmol/L (ref 3.5–5.2)
Sodium: 141 mmol/L (ref 134–144)
Total Protein: 7.7 g/dL (ref 6.0–8.5)
eGFR: 76 mL/min/1.73 (ref >59)

## 2024-06-03 ENCOUNTER — Ambulatory Visit
Admission: RE | Admit: 2024-06-03 | Discharge: 2024-06-03 | Disposition: A | Source: Ambulatory Visit | Attending: Family Medicine | Admitting: Family Medicine

## 2024-06-03 ENCOUNTER — Ambulatory Visit: Admitting: Internal Medicine

## 2024-06-03 DIAGNOSIS — R5381 Other malaise: Secondary | ICD-10-CM | POA: Insufficient documentation

## 2024-06-03 DIAGNOSIS — R509 Fever, unspecified: Secondary | ICD-10-CM | POA: Insufficient documentation

## 2024-06-03 DIAGNOSIS — N6311 Unspecified lump in the right breast, upper outer quadrant: Secondary | ICD-10-CM | POA: Diagnosis not present

## 2024-06-06 ENCOUNTER — Other Ambulatory Visit: Payer: Self-pay | Admitting: Family Medicine

## 2024-06-06 DIAGNOSIS — R928 Other abnormal and inconclusive findings on diagnostic imaging of breast: Secondary | ICD-10-CM

## 2024-06-08 ENCOUNTER — Inpatient Hospital Stay: Admission: RE | Admit: 2024-06-08 | Discharge: 2024-06-08 | Attending: Family Medicine | Admitting: Family Medicine

## 2024-06-08 ENCOUNTER — Ambulatory Visit
Admission: RE | Admit: 2024-06-08 | Discharge: 2024-06-08 | Disposition: A | Discharge status: Home / Self Care | Source: Ambulatory Visit | Attending: Family Medicine | Admitting: Family Medicine

## 2024-06-08 DIAGNOSIS — R928 Other abnormal and inconclusive findings on diagnostic imaging of breast: Secondary | ICD-10-CM

## 2024-06-08 DIAGNOSIS — N6311 Unspecified lump in the right breast, upper outer quadrant: Secondary | ICD-10-CM | POA: Insufficient documentation

## 2024-06-08 DIAGNOSIS — C50411 Malignant neoplasm of upper-outer quadrant of right female breast: Secondary | ICD-10-CM | POA: Insufficient documentation

## 2024-06-08 DIAGNOSIS — R599 Enlarged lymph nodes, unspecified: Secondary | ICD-10-CM | POA: Diagnosis not present

## 2024-06-08 DIAGNOSIS — Z170 Estrogen receptor positive status [ER+]: Secondary | ICD-10-CM | POA: Diagnosis not present

## 2024-06-08 HISTORY — PX: BREAST BIOPSY: SHX20

## 2024-06-08 MED ORDER — LIDOCAINE-EPINEPHRINE 1 %-1:100000 IJ SOLN
8.0000 mL | Freq: Once | INTRAMUSCULAR | Status: AC
  Administered 2024-06-08: 8 mL via INTRADERMAL

## 2024-06-08 MED ORDER — LIDOCAINE 1 % OPTIME INJ - NO CHARGE
2.0000 mL | Freq: Once | INTRAMUSCULAR | Status: AC
  Administered 2024-06-08: 2 mL via INTRADERMAL
  Filled 2024-06-08: qty 2

## 2024-06-09 ENCOUNTER — Ambulatory Visit: Payer: Self-pay | Admitting: Family Medicine

## 2024-06-09 ENCOUNTER — Encounter: Payer: Self-pay | Admitting: *Deleted

## 2024-06-09 DIAGNOSIS — N840 Polyp of corpus uteri: Secondary | ICD-10-CM

## 2024-06-09 LAB — SURGICAL PATHOLOGY

## 2024-06-09 NOTE — Progress Notes (Unsigned)
Received referral for newly diagnosed breast cancer from [facility].  Navigation initiated.  Spoke with daughter Maureen to set up appointments.   She will see both med onc and surgeon tomorrow, Dr. Agrawal at 2:00 and Dr. Sakai at 10:15. 

## 2024-06-10 ENCOUNTER — Encounter: Payer: Self-pay | Admitting: *Deleted

## 2024-06-10 DIAGNOSIS — C50911 Malignant neoplasm of unspecified site of right female breast: Secondary | ICD-10-CM

## 2024-06-10 NOTE — Progress Notes (Signed)
 Spoke with daughter Bobetta.  Referral sent to [facility], their office will call with the appointment.  She will see Dr. Jacobo on 10/30 at 1:30.

## 2024-06-14 ENCOUNTER — Ambulatory Visit: Payer: Self-pay

## 2024-06-14 ENCOUNTER — Encounter: Payer: Self-pay | Admitting: *Deleted

## 2024-06-14 NOTE — Progress Notes (Signed)
 On site sign language interpreter is not available for Thursday during consultation appt.   Notified daughter Bobetta who said that it is fine to use the ipad interpreter services and they want to keep the appt. As scheduled with Dr. Jacobo for Thursday 10/30.   Dr. Jerone team notified of above.

## 2024-06-16 ENCOUNTER — Encounter: Payer: Self-pay | Admitting: Oncology

## 2024-06-16 ENCOUNTER — Inpatient Hospital Stay: Attending: Oncology | Admitting: Oncology

## 2024-06-16 ENCOUNTER — Inpatient Hospital Stay

## 2024-06-16 VITALS — BP 132/79 | HR 64 | Temp 97.6°F | Resp 18 | Ht 64.0 in | Wt 192.0 lb

## 2024-06-16 DIAGNOSIS — Z170 Estrogen receptor positive status [ER+]: Secondary | ICD-10-CM | POA: Insufficient documentation

## 2024-06-16 DIAGNOSIS — C50911 Malignant neoplasm of unspecified site of right female breast: Secondary | ICD-10-CM | POA: Diagnosis not present

## 2024-06-16 DIAGNOSIS — Z808 Family history of malignant neoplasm of other organs or systems: Secondary | ICD-10-CM | POA: Diagnosis not present

## 2024-06-16 DIAGNOSIS — Z1732 Human epidermal growth factor receptor 2 negative status: Secondary | ICD-10-CM | POA: Insufficient documentation

## 2024-06-16 DIAGNOSIS — Z800 Family history of malignant neoplasm of digestive organs: Secondary | ICD-10-CM | POA: Diagnosis not present

## 2024-06-16 DIAGNOSIS — Z803 Family history of malignant neoplasm of breast: Secondary | ICD-10-CM | POA: Diagnosis not present

## 2024-06-16 DIAGNOSIS — Z1722 Progesterone receptor negative status: Secondary | ICD-10-CM | POA: Diagnosis not present

## 2024-06-16 DIAGNOSIS — C50411 Malignant neoplasm of upper-outer quadrant of right female breast: Secondary | ICD-10-CM | POA: Insufficient documentation

## 2024-06-16 NOTE — Progress Notes (Unsigned)
 Patient has scheduled her surgery. She is having some back pain, closer to her left side in middle, rates her pain at about a 7 today.   Patient's daughter is wanting to have a family member on the phone while that doctor is in the room.

## 2024-06-16 NOTE — Progress Notes (Signed)
START ON PATHWAY REGIMEN - Breast     Cycles 1 through 4: A cycle is every 14 days:     Doxorubicin      Cyclophosphamide      Pegfilgrastim-xxxx    Cycles 5 through 16: A cycle is every 7 days:     Paclitaxel   **Always confirm dose/schedule in your pharmacy ordering system**  Patient Characteristics: Preoperative or Nonsurgical Candidate, M0 (Clinical Staging), Up to cT4c, Any N, M0, Neoadjuvant Therapy followed by Surgery, Invasive Disease, Chemotherapy, HER2 Negative, ER Positive Therapeutic Status: Preoperative or Nonsurgical Candidate, M0 (Clinical Staging) AJCC M Category: cM0 AJCC Grade: G3 ER Status: Positive (+) AJCC 8 Stage Grouping: IIB HER2 Status: Negative (-) AJCC T Category: cT2 AJCC N Category: cN0 PR Status: Negative (-) Breast Surgical Plan: Neoadjuvant Therapy followed by Surgery Intent of Therapy: Curative Intent, Discussed with Patient

## 2024-06-16 NOTE — Progress Notes (Unsigned)
 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  INTERVAL HISTORY: Patient is a 58 year old female who noticed the pain in her right breast and also noted a new nodule.  She had not had a mammogram in several years.  Mammogram, ultrasound, biopsy revealed the above-stated malignancy.  She otherwise feels well.  She has no neurologic complaints.  She denies any recent fevers or illnesses.  She has a good appetite and denies weight loss.  She has no chest pain, shortness of breath, cough, or hemoptysis.  She denies any nausea, vomiting, constipation, or diarrhea.  She has no urinary complaints.  Patient offers no further specific complaints today.  REVIEW OF SYSTEMS:   Review of Systems  Constitutional: Negative.  Negative for fever, malaise/fatigue and weight loss.  Respiratory: Negative.  Negative for cough, hemoptysis and shortness of breath.   Cardiovascular: Negative.  Negative for chest pain and leg swelling.  Gastrointestinal: Negative.  Negative for abdominal pain.  Genitourinary: Negative.  Negative for dysuria.  Musculoskeletal: Negative.  Negative for back pain.  Skin: Negative.  Negative for rash.  Neurological: Negative.  Negative for dizziness, focal weakness and weakness.  Psychiatric/Behavioral: Negative.  The patient is not nervous/anxious.     As per HPI. Otherwise, a complete review of systems is negative.  PAST MEDICAL HISTORY: Past Medical History:  Diagnosis Date   Asthma    Deaf    GERD (gastroesophageal reflux disease)    Hypertension 2011   NO (nasal obstruction)    OSA  (obstructive sleep apnea)     PAST SURGICAL HISTORY: Past Surgical History:  Procedure Laterality Date   BREAST BIOPSY Right 06/08/2024   US  RT BREAST BX W LOC DEV 1ST LESION IMG BX SPEC US  GUIDE 06/08/2024 [facility]   CESAREAN SECTION     CHOLECYSTECTOMY     DENTAL SURGERY Bilateral 04/2024   Removed both fang teeth on the top and wisdom as well.   TUBAL LIGATION      FAMILY HISTORY: Family History  Problem Relation Age of Onset   Heart disease Mother    Stroke Mother    Colon cancer Father    Breast cancer Sister        1/2 sister   Brain cancer Brother     ADVANCED DIRECTIVES (Y/N):  N  HEALTH MAINTENANCE: Social History   Tobacco Use   Smoking status: Never   Smokeless tobacco: Never  Vaping Use   Vaping status: Never Used  Substance Use Topics   Alcohol use: Yes    Alcohol/week: 1.0 standard drink of alcohol    Types: 1 Glasses of wine per week    Comment: occassionally    Drug use: Not Currently    Types: Crack cocaine     Colonoscopy:  PAP:  Bone density:  Lipid panel:  Allergies  Allergen Reactions   Egg Protein-Containing Drug Products Nausea And Vomiting   Milk-Related Compounds     Current Outpatient Medications  Medication Sig Dispense Refill   albuterol  (VENTOLIN  HFA) 108 (90 Base) MCG/ACT inhaler Inhale 2 puffs into the lungs every 6 (six) hours as needed for wheezing or shortness of breath. 18  g 6   amLODipine  (NORVASC ) 10 MG tablet TAKE 1 TABLET BY MOUTH ONCE DAILY. TO LOWER BLOOD PRESSURE 90 tablet 3   atorvastatin  (LIPITOR) 10 MG tablet Take 1 tablet (10 mg total) by mouth daily. 90 tablet 3   Blood Pressure Monitor KIT Use to check blood pressure daily 1 kit 0   budesonide -formoterol  (SYMBICORT ) 80-4.5 MCG/ACT inhaler Inhale 2 puffs into the lungs in the morning and at bedtime. 1 each 2   buPROPion  (WELLBUTRIN  XL) 150 MG 24 hr tablet Take 1 tablet (150 mg total) by mouth daily. Take 1 tablet daily for 3 days, then take 2  tablets daily 59 tablet 0   carvedilol  (COREG ) 3.125 MG tablet Take 1 tablet (3.125 mg total) by mouth 2 (two) times daily with a meal. 180 tablet 3   famotidine  (PEPCID ) 20 MG tablet Take 1 tablet (20 mg total) by mouth daily before breakfast. 90 tablet 3   levocetirizine (XYZAL ) 5 MG tablet Take 1 tablet (5 mg total) by mouth every evening. 90 tablet 3   Minoxidil  5 % FOAM Apply 1 Application topically 2 (two) times daily. 60 g 0   Misc. Devices MISC CPAP therapy on 17 cm H2O . Patient to use X-Small size Resmed Full Face Mask AirFit F10. Diagnosis - Obstructive sleep apnea 1 each 0   naltrexone  (DEPADE) 50 MG tablet Take 0.5 tablets (25 mg total) by mouth daily. 15 tablet 2   nystatin  (MYCOSTATIN /NYSTOP ) powder Apply 1 application topically 2 (two) times daily. Apply to affected area under breast 15 g 0   Olopatadine  HCl 0.2 % SOLN Apply 1 drop to eye daily as needed (itchy/watery eyes). 2.5 mL 5   omeprazole  (PRILOSEC  OTC) 20 MG tablet Take 2 tablets (40 mg total) by mouth daily for 14 days. 28 tablet 1   traZODone  (DESYREL ) 50 MG tablet Take 0.5-1 tablets (25-50 mg total) by mouth at bedtime as needed for sleep. 30 tablet 3   valsartan -hydrochlorothiazide  (DIOVAN -HCT) 320-25 MG tablet Take 1 tablet by mouth daily. 90 tablet 3   Vitamin D , Ergocalciferol , (DRISDOL ) 1.25 MG (50000 UNIT) CAPS capsule Take 1 capsule (50,000 Units total) by mouth every 7 (seven) days. 12 capsule 1   No current facility-administered medications for this visit.    OBJECTIVE: Vitals:   06/16/24 1337  BP: 132/79  Pulse: 64  Resp: 18  Temp: 97.6 F (36.4 C)  SpO2: 98%     Body mass index is 32.96 kg/m.    ECOG FS:0 - Asymptomatic  General: Well-developed, well-nourished, no acute distress. Eyes: Pink conjunctiva, anicteric sclera. HEENT: Normocephalic, moist mucous membranes. Lungs: No audible wheezing or coughing. Heart: Regular rate and rhythm. Abdomen: Soft, nontender, no obvious  distention. Musculoskeletal: No edema, cyanosis, or clubbing. Neuro: Alert, answering all questions appropriately. Cranial nerves grossly intact. Skin: No rashes or petechiae noted. Psych: Normal affect. Lymphatics: No cervical, calvicular, axillary or inguinal LAD.   LAB RESULTS:  Lab Results  Component Value Date   NA 141 05/31/2024   K 3.8 05/31/2024   CL 100 05/31/2024   CO2 25 05/31/2024   GLUCOSE 91 05/31/2024   BUN 13 05/31/2024   CREATININE 0.89 05/31/2024   CALCIUM  9.7 05/31/2024   PROT 7.7 05/31/2024   ALBUMIN 4.5 05/31/2024   AST 18 05/31/2024   ALT 18 05/31/2024   ALKPHOS 92 05/31/2024   BILITOT 0.4 05/31/2024   GFRNONAA >60 06/08/2023   GFRAA 82 01/05/2020    Lab Results  Component Value Date  WBC 5.4 05/31/2024   NEUTROABS 2.9 05/31/2024   HGB 11.9 05/31/2024   HCT 36.5 05/31/2024   MCV 91 05/31/2024   PLT 440 05/31/2024     STUDIES: US  AXILLARY NODE CORE BIOPSY RIGHT Addendum Date: 06/09/2024 ADDENDUM REPORT: 06/09/2024 12:34 ADDENDUM: PATHOLOGY revealed: Site 1. Breast, right, needle core biopsy, breast mass. (heart clip) 10 o'clock, 15 cmfn - INVASIVE DUCTAL CARCINOMA - OVERALL GRADE: 3 - LYMPHOVASCULAR INVASION: NOT IDENTIFIED CANCER LENGTH: 16 MM / 1.6 CM - CALCIFICATIONS: NOT IDENTIFIED Pathology results are CONCORDANT with imaging findings, per Dr. Alm Parkins. PATHOLOGY revealed: Site 2. Lymph node, needle/core biopsy, right axillary lymph node (hydromark butterfly clip)- ONE LYMPH NODE, NEGATIVE FOR METASTATIC CARCINOMA (0/1). Pathology results are CONCORDANT with imaging findings, per Dr. Alm Parkins. Pathology results and recommendations below were discussed with patient via daughter Watson) by telephone on 06/09/2024 by Rock Hover RN. Patient reported biopsy site within normal limits with slight tenderness at the site. Post biopsy care instructions were reviewed, questions were answered and my direct phone number was provided to patient.  Patient was instructed to call [facility] if any concerns or questions arise related to the biopsy. RECOMMENDATIONS: 1. Surgical and oncological consultation. Request for surgical and oncological consultation relayed to Shasta Ada RN at [facility] by Rock Hover RN on 06/09/2024. Pathology results reported by Rock Hover RN on 06/09/2024. Electronically Signed   By: Alm Parkins M.D.   On: 06/09/2024 12:34   Result Date: 06/09/2024 CLINICAL DATA:  Patient presents for ultrasound-guided core needle biopsy of a right breast mass and a right axillary lymph node. EXAM: ULTRASOUND GUIDED RIGHT BREAST CORE NEEDLE BIOPSY ULTRASOUND GUIDED RIGHT AXILLARY LYMPH NODE CORE NEEDLE BIOPSY COMPARISON:  Previous exam(s). PROCEDURE: I met with the patient and we discussed the procedure of ultrasound-guided biopsy, including benefits and alternatives. We discussed the high likelihood of a successful procedure. We discussed the risks of the procedure, including infection, bleeding, tissue injury, clip migration, and inadequate sampling. Informed written consent was given. The usual time-out protocol was performed immediately prior to the procedure. Biopsy #1: 2.5 cm mass at 10 o'clock, 15 cm the nipple. Lesion quadrant: Upper outer quadrant Using sterile technique and 1% Lidocaine as local anesthetic, under direct ultrasound visualization, a 12 gauge spring-loaded device was used to perform biopsy of the mass at 10 o'clock using an inferior approach. At the conclusion of the procedure a heart shaped tissue marker clip was deployed into the biopsy cavity. Biopsy #2: Right axillary lymph node with the mildly thickened cortex. Lesion location: Right axilla Using sterile technique and 1% Lidocaine as local anesthetic, under direct ultrasound visualization, a 14 gauge spring-loaded device was used to perform biopsy of the right axillary lymph node with the mildly thickened cortex using an inferior  approach. At the conclusion of the procedure a HydroMARK, butterfly shaped tissue marker clip was deployed into the biopsy cavity. Follow up 2 view mammogram was performed and dictated separately. IMPRESSION: Ultrasound guided biopsy of a right breast mass and a right axillary lymph node. No apparent complications. Electronically Signed: By: Alm Parkins M.D. On: 06/08/2024 08:50   US  RT BREAST BX W LOC DEV 1ST LESION IMG BX SPEC US  GUIDE Addendum Date: 06/09/2024 ADDENDUM REPORT: 06/09/2024 12:34 ADDENDUM: PATHOLOGY revealed: Site 1. Breast, right, needle core biopsy, breast mass. (heart clip) 10 o'clock, 15 cmfn - INVASIVE DUCTAL CARCINOMA - OVERALL GRADE: 3 - LYMPHOVASCULAR INVASION: NOT IDENTIFIED CANCER LENGTH: 16 MM / 1.6  CM - CALCIFICATIONS: NOT IDENTIFIED Pathology results are CONCORDANT with imaging findings, per Dr. Alm Parkins. PATHOLOGY revealed: Site 2. Lymph node, needle/core biopsy, right axillary lymph node (hydromark butterfly clip)- ONE LYMPH NODE, NEGATIVE FOR METASTATIC CARCINOMA (0/1). Pathology results are CONCORDANT with imaging findings, per Dr. Alm Parkins. Pathology results and recommendations below were discussed with patient via daughter Watson) by telephone on 06/09/2024 by Rock Hover RN. Patient reported biopsy site within normal limits with slight tenderness at the site. Post biopsy care instructions were reviewed, questions were answered and my direct phone number was provided to patient. Patient was instructed to call [facility] if any concerns or questions arise related to the biopsy. RECOMMENDATIONS: 1. Surgical and oncological consultation. Request for surgical and oncological consultation relayed to Shasta Ada RN at [facility] by Rock Hover RN on 06/09/2024. Pathology results reported by Rock Hover RN on 06/09/2024. Electronically Signed   By: Alm Parkins M.D.   On: 06/09/2024 12:34   Result Date: 06/09/2024 CLINICAL DATA:  Patient  presents for ultrasound-guided core needle biopsy of a right breast mass and a right axillary lymph node. EXAM: ULTRASOUND GUIDED RIGHT BREAST CORE NEEDLE BIOPSY ULTRASOUND GUIDED RIGHT AXILLARY LYMPH NODE CORE NEEDLE BIOPSY COMPARISON:  Previous exam(s). PROCEDURE: I met with the patient and we discussed the procedure of ultrasound-guided biopsy, including benefits and alternatives. We discussed the high likelihood of a successful procedure. We discussed the risks of the procedure, including infection, bleeding, tissue injury, clip migration, and inadequate sampling. Informed written consent was given. The usual time-out protocol was performed immediately prior to the procedure. Biopsy #1: 2.5 cm mass at 10 o'clock, 15 cm the nipple. Lesion quadrant: Upper outer quadrant Using sterile technique and 1% Lidocaine as local anesthetic, under direct ultrasound visualization, a 12 gauge spring-loaded device was used to perform biopsy of the mass at 10 o'clock using an inferior approach. At the conclusion of the procedure a heart shaped tissue marker clip was deployed into the biopsy cavity. Biopsy #2: Right axillary lymph node with the mildly thickened cortex. Lesion location: Right axilla Using sterile technique and 1% Lidocaine as local anesthetic, under direct ultrasound visualization, a 14 gauge spring-loaded device was used to perform biopsy of the right axillary lymph node with the mildly thickened cortex using an inferior approach. At the conclusion of the procedure a HydroMARK, butterfly shaped tissue marker clip was deployed into the biopsy cavity. Follow up 2 view mammogram was performed and dictated separately. IMPRESSION: Ultrasound guided biopsy of a right breast mass and a right axillary lymph node. No apparent complications. Electronically Signed: By: Alm Parkins M.D. On: 06/08/2024 08:50   MM CLIP PLACEMENT RIGHT Result Date: 06/08/2024 CLINICAL DATA:  Assess post biopsy marker clip placement  following ultrasound-guided core needle biopsy of a right breast mass and right axillary lymph node. EXAM: 3D DIAGNOSTIC RIGHT MAMMOGRAM POST ULTRASOUND BIOPSY COMPARISON:  Previous exam(s). ACR Breast Density Category b: There are scattered areas of fibroglandular density. FINDINGS: 3D Mammographic images were obtained following ultrasound guided biopsy of a right breast mass and a right axillary lymph node. The heart shaped biopsy marking clip is within the mass in the upper outer right breast. The right axillary lymph node HydroMARK, butterfly shaped marker clip could not be visualized mammographically. IMPRESSION: Appropriate positioning of the heart shaped biopsy marking clip at the site of biopsy in the mass in the upper outer right breast. Axillary lymph node biopsy marker clip could not be visualized  mammographically. Final Assessment: Post Procedure Mammograms for Marker Placement Electronically Signed   By: Alm Parkins M.D.   On: 06/08/2024 09:00   MM 3D DIAGNOSTIC MAMMOGRAM BILATERAL BREAST Result Date: 06/03/2024 CLINICAL DATA:  Palpable area in the RIGHT breast. EXAM: DIGITAL DIAGNOSTIC BILATERAL MAMMOGRAM WITH TOMOSYNTHESIS AND CAD; ULTRASOUND RIGHT BREAST LIMITED TECHNIQUE: Bilateral digital diagnostic mammography and breast tomosynthesis was performed. The images were evaluated with computer-aided detection. ; Targeted ultrasound examination of the right breast was performed COMPARISON:  Previous exam(s). ACR Breast Density Category b: There are scattered areas of fibroglandular density. FINDINGS: Spot compression tomosynthesis views were obtained of the site of palpable concern in the RIGHT breast. There is an irregular oval mass subjacent to the site of palpable concern. An additional questioned asymmetry in the RIGHT outer breast resolves with additional views, consistent with overlapping tissue. No suspicious mass, distortion, or microcalcifications are identified to suggest presence of  malignancy in the LEFT breast. On physical exam, there is firmness of the RIGHT upper outer breast. Targeted ultrasound was performed the RIGHT upper outer breast. At 10 o'clock 15 cm from the nipple, there is an oval hypoechoic mass with irregular margins. It measures 25 x 25 x 21 mm. This corresponds to the mass noted mammographically. Targeted ultrasound was performed of the RIGHT upper outer breast. No suspicious cystic or solid mass is seen at the site of asymmetry concern. Targeted ultrasound was performed of the RIGHT axilla. There is a mildly enlarged RIGHT axillary lymph node which demonstrates focal cortical thickening of approximately 5 mm. Additional RIGHT axillary lymph nodes demonstrate cortical thickness of approximately 3 mm. IMPRESSION: 1. There is a 25 mm mass at the site of palpable concern which is concerning for malignancy. Recommend ultrasound-guided biopsy for definitive characterization. 2. There is a mildly enlarged RIGHT axillary lymph node with focal cortical thickening of 5 mm. Recommend ultrasound-guided biopsy for definitive characterization. 3. No mammographic evidence of malignancy in the LEFT breast. RECOMMENDATION: 1. RIGHT breast ultrasound-guided biopsy x1 2. RIGHT axillary ultrasound-guided biopsy x1 I have discussed the findings and recommendations with the patient and patient's daughters with the assistance of a sign language interpreter. The biopsy procedure was discussed with the patient and questions were answered. Patient expressed their understanding of the biopsy recommendation. Patient will be scheduled for biopsy at her earliest convenience by the schedulers. Ordering provider will be notified. If applicable, a reminder letter will be sent to the patient regarding the next appointment. BI-RADS CATEGORY  5: Highly suggestive of malignancy. Electronically Signed   By: Corean Salter M.D.   On: 06/03/2024 16:13   US  LIMITED ULTRASOUND INCLUDING AXILLA RIGHT  BREAST Result Date: 06/03/2024 CLINICAL DATA:  Palpable area in the RIGHT breast. EXAM: DIGITAL DIAGNOSTIC BILATERAL MAMMOGRAM WITH TOMOSYNTHESIS AND CAD; ULTRASOUND RIGHT BREAST LIMITED TECHNIQUE: Bilateral digital diagnostic mammography and breast tomosynthesis was performed. The images were evaluated with computer-aided detection. ; Targeted ultrasound examination of the right breast was performed COMPARISON:  Previous exam(s). ACR Breast Density Category b: There are scattered areas of fibroglandular density. FINDINGS: Spot compression tomosynthesis views were obtained of the site of palpable concern in the RIGHT breast. There is an irregular oval mass subjacent to the site of palpable concern. An additional questioned asymmetry in the RIGHT outer breast resolves with additional views, consistent with overlapping tissue. No suspicious mass, distortion, or microcalcifications are identified to suggest presence of malignancy in the LEFT breast. On physical exam, there is firmness of the RIGHT upper outer breast.  Targeted ultrasound was performed the RIGHT upper outer breast. At 10 o'clock 15 cm from the nipple, there is an oval hypoechoic mass with irregular margins. It measures 25 x 25 x 21 mm. This corresponds to the mass noted mammographically. Targeted ultrasound was performed of the RIGHT upper outer breast. No suspicious cystic or solid mass is seen at the site of asymmetry concern. Targeted ultrasound was performed of the RIGHT axilla. There is a mildly enlarged RIGHT axillary lymph node which demonstrates focal cortical thickening of approximately 5 mm. Additional RIGHT axillary lymph nodes demonstrate cortical thickness of approximately 3 mm. IMPRESSION: 1. There is a 25 mm mass at the site of palpable concern which is concerning for malignancy. Recommend ultrasound-guided biopsy for definitive characterization. 2. There is a mildly enlarged RIGHT axillary lymph node with focal cortical thickening of 5  mm. Recommend ultrasound-guided biopsy for definitive characterization. 3. No mammographic evidence of malignancy in the LEFT breast. RECOMMENDATION: 1. RIGHT breast ultrasound-guided biopsy x1 2. RIGHT axillary ultrasound-guided biopsy x1 I have discussed the findings and recommendations with the patient and patient's daughters with the assistance of a sign language interpreter. The biopsy procedure was discussed with the patient and questions were answered. Patient expressed their understanding of the biopsy recommendation. Patient will be scheduled for biopsy at her earliest convenience by the schedulers. Ordering provider will be notified. If applicable, a reminder letter will be sent to the patient regarding the next appointment. BI-RADS CATEGORY  5: Highly suggestive of malignancy. Electronically Signed   By: Corean Salter M.D.   On: 06/03/2024 16:13   US  PELVIC COMPLETE WITH TRANSVAGINAL Result Date: 05/31/2024 EXAM: US  Pelvis, Complete Transvaginal and Transabdominal without Doppler TECHNIQUE: Transabdominal and transvaginal pelvic duplex ultrasound using B-mode/gray scaled imaging without Doppler spectral analysis and color flow was obtained. COMPARISON: CT abdomen and pelvis 06/10/2016 CLINICAL HISTORY: postmenopausal bleeding x6 months. FINDINGS: UTERUS: Uterus measures 5.7 x 5.1 x 6.4 cm with a volume of 96 ml. 10.6 cm fibroid in the anterior uterus and 2.0 cm fibroid in the left uterine fundus. ENDOMETRIAL STRIPE: Heterogeneous poorly defined endometrium measuring 13 mm. There is an endometrial polyp in the lower uterine segment measuring 2.1 cm. RIGHT OVARY: The ovaries were not visualized. LEFT OVARY: The ovaries were not visualized. FREE FLUID: No free fluid in the pelvis. IMPRESSION: 1. Heterogeneous, poorly defined endometrium measuring 13 mm with a 2.1 cm endometrial polyp in the lower uterine segment. In the setting of post-menopausal bleeding, endometrial sampling is indicated to  exclude carcinoma. If results are benign, sonohysterogram should be considered for focal lesion work-up. (Ref: Radiological Reasoning: Algorithmic Workup of Abnormal Vaginal Bleeding with Endovaginal Sonography and Sonohysterography. AJR 2008; [id number]:D31-26) 2. Fibroid uterus. 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  After lengthy discussion with the patient, she has agreed to proceed with neoadjuvant chemotherapy with Adriamycin and Cytoxan followed by weekly Taxol.  This will then be followed by lumpectomy and adjuvant XRT.  After completion of all her treatments, she will benefit from 5 years of letrozole given the ER positivity.  Prior to initiating treatment patient will require port placement and MUGA scan.  Return to clinic in approximately 2 weeks to initiate cycle  1 Adriamycin and Cytoxan. Family history: Patient reports her sister also has breast cancer and she was given a referral to genetics.  Patient expressed understanding and was in agreement with this plan. She also understands that She can call clinic at any time with any questions, concerns, or complaints.    Cancer Staging  Invasive ductal carcinoma of right breast [facility]) Staging form: Breast, AJCC 8th Edition - Clinical stage from 06/16/2024: Stage IIB (cT2, cN0, cM0, G3, ER+, PR-, HER2-) - Signed by Jacobo Evalene PARAS, MD on 06/16/2024 Stage prefix: Initial diagnosis Histologic grading system: 3 grade system   Evalene PARAS Jacobo, MD   06/17/2024 1:33 PM

## 2024-06-17 ENCOUNTER — Encounter: Payer: Self-pay | Admitting: Oncology

## 2024-06-17 MED ORDER — LIDOCAINE-PRILOCAINE 2.5-2.5 % EX CREA: TOPICAL_CREAM | CUTANEOUS | 3 refills | Status: AC

## 2024-06-17 MED ORDER — PROCHLORPERAZINE MALEATE 10 MG PO TABS: 10.0000 mg | ORAL_TABLET | Freq: Four times a day (QID) | ORAL | 2 refills | Status: DC | PRN

## 2024-06-17 MED ORDER — ONDANSETRON HCL 8 MG PO TABS: ORAL_TABLET | ORAL | 2 refills | Status: DC

## 2024-06-18 ENCOUNTER — Other Ambulatory Visit: Payer: Self-pay

## 2024-06-20 ENCOUNTER — Telehealth: Payer: Self-pay | Admitting: General Surgery

## 2024-06-20 ENCOUNTER — Inpatient Hospital Stay

## 2024-06-20 ENCOUNTER — Encounter: Payer: Self-pay | Admitting: General Surgery

## 2024-06-20 ENCOUNTER — Ambulatory Visit: Payer: Self-pay | Admitting: General Surgery

## 2024-06-20 ENCOUNTER — Ambulatory Visit: Admitting: General Surgery

## 2024-06-20 VITALS — BP 148/76 | HR 62 | Temp 98.2°F | Ht 64.0 in | Wt 189.8 lb

## 2024-06-20 DIAGNOSIS — C50911 Malignant neoplasm of unspecified site of right female breast: Secondary | ICD-10-CM

## 2024-06-20 NOTE — Telephone Encounter (Signed)
 Patient has been advised of Pre-Admission date/time, and Surgery date at [facility].  Surgery Date: 06/22/24 Preadmission Testing Date: 06/21/24 in person visit at 10:30 am   At time of visit through interpreter patient informed of the scheduling process and surgery information given at time of office visit.    Patient has been made aware to call [phone], between 1-3:00pm the day before surgery, to find out what time to arrive for surgery.

## 2024-06-20 NOTE — Patient Instructions (Signed)

## 2024-06-20 NOTE — Progress Notes (Signed)
 Patient ID: Caitlin [name], female   DOB: 23-Apr-1966, 58 y.o.   MRN: [id number] CC: Right Breast Cancer History of Present Illness Caitlin [name] is a 58 y.o. female with past medical history significant for hypertension who presents in consultation for right breast cancer.  The patient reports that several weeks ago she noticed a nodule in her right breast.  She also said this was associated with right breast pain.  She denies any overlying skin changes or nipple discharge.  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  The patient does report a history of birth control.  She has been through menopause.  She does have a sister that had breast cancer.  She first had menses at age 80 and she is a G4, P3.SABRA  History obtained with aid of interpreter as patient is deaft.  Past Medical History Past Medical History:  Diagnosis Date   Asthma    Deaf    GERD (gastroesophageal reflux disease)    Hypertension 2011   NO (nasal obstruction)    OSA (obstructive sleep apnea)        Past Surgical History:  Procedure Laterality Date   BREAST BIOPSY Right 06/08/2024   US  RT BREAST BX W LOC DEV 1ST LESION IMG BX SPEC US  GUIDE 06/08/2024 [facility]   CESAREAN SECTION     CHOLECYSTECTOMY     DENTAL SURGERY Bilateral 04/2024   Removed both fang teeth on the top and wisdom as well.   TUBAL LIGATION      Allergies  Allergen Reactions   Egg Protein-Containing Drug Products Nausea And Vomiting   Milk-Related Compounds     Current Outpatient Medications  Medication Sig Dispense Refill   albuterol  (VENTOLIN  HFA) 108 (90 Base) MCG/ACT inhaler Inhale 2 puffs into the lungs every 6 (six) hours as needed for wheezing or shortness of breath. 18 g 6   amLODipine  (NORVASC ) 10 MG tablet TAKE 1 TABLET BY MOUTH ONCE DAILY. TO LOWER BLOOD PRESSURE 90 tablet 3    atorvastatin  (LIPITOR) 10 MG tablet Take 1 tablet (10 mg total) by mouth daily. 90 tablet 3   Blood Pressure Monitor KIT Use to check blood pressure daily 1 kit 0   budesonide -formoterol  (SYMBICORT ) 80-4.5 MCG/ACT inhaler Inhale 2 puffs into the lungs in the morning and at bedtime. 1 each 2   carvedilol  (COREG ) 3.125 MG tablet Take 1 tablet (3.125 mg total) by mouth 2 (two) times daily with a meal. 180 tablet 3   famotidine  (PEPCID ) 20 MG tablet Take 1 tablet (20 mg total) by mouth daily before breakfast. 90 tablet 3   levocetirizine (XYZAL ) 5 MG tablet Take 1 tablet (5 mg total) by mouth every evening. 90 tablet 3   lidocaine-prilocaine (EMLA) cream Apply to affected area once 30 g 3   Misc. Devices MISC CPAP therapy on 17 cm H2O . Patient to use X-Small size Resmed Full Face Mask AirFit F10. Diagnosis - Obstructive sleep apnea 1 each 0   ondansetron  (ZOFRAN ) 8 MG tablet Take by mouth every 8 (eight) hours as needed for nausea or vomiting.     prochlorperazine (COMPAZINE) 10 MG tablet Take 10 mg by mouth every 6 (six) hours as needed for nausea or vomiting.     valsartan -hydrochlorothiazide  (DIOVAN -HCT) 320-25 MG tablet Take 1 tablet by mouth daily. 90 tablet 3   Vitamin D , Ergocalciferol , (DRISDOL )  1.25 MG (50000 UNIT) CAPS capsule Take 1 capsule (50,000 Units total) by mouth every 7 (seven) days. 12 capsule 1   No current facility-administered medications for this visit.    Family History Family History  Problem Relation Age of Onset   Heart disease Mother    Stroke Mother    Colon cancer Father    Breast cancer Sister        1/2 sister   Brain cancer Brother        Social History Social History   Tobacco Use   Smoking status: Never   Smokeless tobacco: Never  Vaping Use   Vaping status: Never Used  Substance Use Topics   Alcohol use: Yes    Alcohol/week: 1.0 standard drink of alcohol    Types: 1 Glasses of wine per week    Comment: occassionally    Drug use: Not Currently     Types: Crack cocaine        ROS Full ROS of systems performed and is otherwise negative there than what is stated in the HPI  Physical Exam Blood pressure (!) 148/76, pulse 62, temperature 98.2 F (36.8 C), temperature source Oral, height 5' 4 (1.626 m), weight 189 lb 12.8 oz (86.1 kg), last menstrual period 05/08/2016, SpO2 99%.  Alert and oriented x 3, normal work of breathing on room air, clear to auscultation bilaterally, regular rate and rhythm, no neck or upper chest scars, left breast exam performed in the presence of a chaperone.  There is no left axillary lymphadenopathy, skin changes or dominant masses within the left breast.  In the right breast there is no axillary lymphadenopathy.  In the upper outer quadrant of the right breast there is a palpable nodule that is tender to palpation, there is no overlying skin changes. Data Reviewed Mammogram personally reviewed.  She has an area of concern in the right upper outer quadrant.  I also reviewed Dr. Jerone note and the plan is for chemotherapy.  I have personally reviewed the patient's imaging and medical records.    Assessment    Patient with invasive ductal carcinoma of the right breast.  Plan    She is undergoing chemotherapy.  Will plan for port placement.  I discussed risk, benefits alternatives the procedure including risk of infection, bleeding, port infection, pneumothorax and damage to adjacent structures.  She understands these risks and wishes to proceed with surgery.    Jayson MALVA Endow 06/20/2024, 11:24 AM

## 2024-06-21 ENCOUNTER — Other Ambulatory Visit: Payer: Self-pay

## 2024-06-21 ENCOUNTER — Encounter
Admission: RE | Admit: 2024-06-21 | Discharge: 2024-06-21 | Disposition: A | Discharge status: Home / Self Care | Source: Ambulatory Visit | Attending: General Surgery | Admitting: General Surgery

## 2024-06-21 ENCOUNTER — Encounter: Payer: Self-pay | Admitting: Oncology

## 2024-06-21 HISTORY — DX: Malignant (primary) neoplasm, unspecified: C80.1

## 2024-06-21 MED ORDER — CHLORHEXIDINE GLUCONATE CLOTH 2 % EX PADS: 6.0000 | MEDICATED_PAD | Freq: Once | CUTANEOUS | Status: DC

## 2024-06-21 MED ORDER — LACTATED RINGERS IV SOLN: INTRAVENOUS | Status: DC

## 2024-06-21 MED ORDER — CHLORHEXIDINE GLUCONATE 0.12 % MT SOLN
15.0000 mL | Freq: Once | OROMUCOSAL | Status: AC
  Administered 2024-06-22: 15 mL via OROMUCOSAL

## 2024-06-21 MED ORDER — CEFAZOLIN SODIUM-DEXTROSE 2-4 GM/100ML-% IV SOLN
2.0000 g | INTRAVENOUS | Status: AC
  Administered 2024-06-22: 2 g via INTRAVENOUS

## 2024-06-21 MED ORDER — ORAL CARE MOUTH RINSE: 15.0000 mL | Freq: Once | OROMUCOSAL | Status: AC

## 2024-06-21 NOTE — Patient Instructions (Addendum)
 Your procedure is scheduled on: 06/22/24  Report to the Registration Desk on the 1st floor of the [facility]. To find out your arrival time, please call [phone] between 1PM - 3PM on: 06/21/24 If your arrival time is 6:00 am, do not arrive before that time as the [facility] entrance doors do not open until 6:00 am.  REMEMBER: Instructions that are not followed completely may result in serious medical risk, up to and including death; or upon the discretion of your surgeon and anesthesiologist your surgery may need to be rescheduled.  Do not eat food or drink any liquids after midnight the night before surgery.  No gum chewing or hard candies.  One week prior to surgery: Stop Anti-inflammatories (NSAIDS) such as Advil, Aleve , Ibuprofen, Motrin, Naproxen , Naprosyn  and Aspirin  based products such as Excedrin, Goody's Powder, BC Powder. You may take Tylenol  if needed for pain up until the day of surgery.  Stop ANY OVER THE COUNTER supplements until after surgery.  ON THE DAY OF SURGERY ONLY TAKE THESE MEDICATIONS WITH SIPS OF WATER:  albuterol  (VENTOLIN  HFA) - Use inhalers on the day of surgery and bring to the hospital.  budesonide -formoterol  (SYMBICORT ) - Use inhalers on the day of surgery and bring to the hospital. amLODipine  (NORVASC )  carvedilol  (COREG )  famotidine  (PEPCID )     No Alcohol for 24 hours before or after surgery.  No Smoking including e-cigarettes for 24 hours before surgery.  No chewable tobacco products for at least 6 hours before surgery.  No nicotine patches on the day of surgery.  Do not use any recreational drugs for at least a week (preferably 2 weeks) before your surgery.  Please be advised that the combination of cocaine and anesthesia may have negative outcomes, up to and including death. If you test positive for cocaine, your surgery will be cancelled.  On the morning of surgery brush your teeth with toothpaste and water, you may rinse your  mouth with mouthwash if you wish. Do not swallow any toothpaste or mouthwash.  Use CHG Soap or wipes as directed on instruction sheet.  Do not wear jewelry, make-up, hairpins, clips or nail polish.  For welded (permanent) jewelry: bracelets, anklets, waist bands, etc.  Please have this removed prior to surgery.  If it is not removed, there is a chance that hospital personnel will need to cut it off on the day of surgery.  Do not wear lotions, powders, or perfumes.   Do not shave body hair from the neck down 48 hours before surgery.  Contact lenses, hearing aids and dentures may not be worn into surgery.  Do not bring valuables to the hospital. [facility] is not responsible for any missing/lost belongings or valuables.   Notify your doctor if there is any change in your medical condition (cold, fever, infection).  Wear comfortable clothing (specific to your surgery type) to the hospital.  After surgery, you can help prevent lung complications by doing breathing exercises.  Take deep breaths and cough every 1-2 hours. Your doctor may order a device called an Incentive Spirometer to help you take deep breaths.  When coughing or sneezing, hold a pillow firmly against your incision with both hands. This is called "splinting." Doing this helps protect your incision. It also decreases belly discomfort.  If you are being admitted to the hospital overnight, leave your suitcase in the car. After surgery it may be brought to your room.  In case of increased patient census, it may be  necessary for you, the patient, to continue your postoperative care in the Same Day Surgery department.  If you are being discharged the day of surgery, you will not be allowed to drive home. You will need a responsible individual to drive you home and stay with you for 24 hours after surgery.   If you are taking public transportation, you will need to have a responsible individual with you.  Please call the  Pre-admissions Testing Dept. at [phone] if you have any questions about these instructions.  Surgery Visitation Policy:  Patients having surgery or a procedure may have two visitors.  Children under the age of 4 must have an adult with them who is not the patient.  Inpatient Visitation:    Visiting hours are 7 a.m. to 8 p.m. Up to four visitors are allowed at one time in a patient room. The visitors may rotate out with other people during the day.  One visitor age 33 or older may stay with the patient overnight and must be in the room by 8 p.m.   Merchandiser, Retail to address health-related social needs:  [email or web address]                                                                                                             Preparing for Surgery with CHLORHEXIDINE GLUCONATE (CHG) Soap  Chlorhexidine Gluconate (CHG) Soap  o An antiseptic cleaner that kills germs and bonds with the skin to continue killing germs even after washing  o Used for showering the night before surgery and morning of surgery  Before surgery, you can play an important role by reducing the number of germs on your skin.  CHG (Chlorhexidine gluconate) soap is an antiseptic cleanser which kills germs and bonds with the skin to continue killing germs even after washing.  Please do not use if you have an allergy to CHG or antibacterial soaps. If your skin becomes reddened/irritated stop using the CHG.  1. Shower the NIGHT BEFORE SURGERY with CHG soap.  2. If you choose to wash your hair, wash your hair first as usual with your normal shampoo.  3. After shampooing, rinse your hair and body thoroughly to remove the shampoo.  4. Use CHG as you would any other liquid soap. You can apply CHG directly to the skin and wash gently with a clean washcloth.  5. Apply the CHG soap to your body only from the neck down. Do not use on open wounds or open sores. Avoid contact with your  eyes, ears, mouth, and genitals (private parts). Wash face and genitals (private parts) with your normal soap.  6. Wash thoroughly, paying special attention to the area where your surgery will be performed.  7. Thoroughly rinse your body with warm water.  8. Do not shower/wash with your normal soap after using and rinsing off the CHG soap.  9. Do not use lotions, oils, etc., after showering with CHG.  10. Pat yourself dry with a clean towel.  11. Wear clean pajamas to bed the  night before surgery.  12. Place clean sheets on your bed the night of your shower and do not sleep with pets.  13. Do not apply any deodorants/lotions/powders.  14. Please wear clean clothes to the hospital.  15. Remember to brush your teeth with your regular toothpaste.

## 2024-06-22 ENCOUNTER — Ambulatory Visit

## 2024-06-22 ENCOUNTER — Ambulatory Visit
Admission: RE | Admit: 2024-06-22 | Discharge: 2024-06-22 | Disposition: A | Discharge status: Home / Self Care | Attending: General Surgery | Admitting: General Surgery

## 2024-06-22 ENCOUNTER — Other Ambulatory Visit: Payer: Self-pay

## 2024-06-22 ENCOUNTER — Ambulatory Visit: Payer: Self-pay | Admitting: Urgent Care

## 2024-06-22 ENCOUNTER — Encounter
Admission: RE | Disposition: A | Discharge status: Home / Self Care | Payer: Self-pay | Source: Home / Self Care | Attending: General Surgery

## 2024-06-22 DIAGNOSIS — Z170 Estrogen receptor positive status [ER+]: Secondary | ICD-10-CM | POA: Insufficient documentation

## 2024-06-22 DIAGNOSIS — I129 Hypertensive chronic kidney disease with stage 1 through stage 4 chronic kidney disease, or unspecified chronic kidney disease: Secondary | ICD-10-CM | POA: Insufficient documentation

## 2024-06-22 DIAGNOSIS — C50919 Malignant neoplasm of unspecified site of unspecified female breast: Secondary | ICD-10-CM | POA: Diagnosis not present

## 2024-06-22 DIAGNOSIS — Z1732 Human epidermal growth factor receptor 2 negative status: Secondary | ICD-10-CM | POA: Diagnosis not present

## 2024-06-22 DIAGNOSIS — G4733 Obstructive sleep apnea (adult) (pediatric): Secondary | ICD-10-CM | POA: Insufficient documentation

## 2024-06-22 DIAGNOSIS — Z1722 Progesterone receptor negative status: Secondary | ICD-10-CM | POA: Diagnosis not present

## 2024-06-22 DIAGNOSIS — J45909 Unspecified asthma, uncomplicated: Secondary | ICD-10-CM | POA: Insufficient documentation

## 2024-06-22 DIAGNOSIS — Z6832 Body mass index (BMI) 32.0-32.9, adult: Secondary | ICD-10-CM | POA: Diagnosis not present

## 2024-06-22 DIAGNOSIS — N182 Chronic kidney disease, stage 2 (mild): Secondary | ICD-10-CM | POA: Insufficient documentation

## 2024-06-22 DIAGNOSIS — Z780 Asymptomatic menopausal state: Secondary | ICD-10-CM | POA: Diagnosis not present

## 2024-06-22 DIAGNOSIS — R0989 Other specified symptoms and signs involving the circulatory and respiratory systems: Secondary | ICD-10-CM | POA: Diagnosis not present

## 2024-06-22 DIAGNOSIS — C50911 Malignant neoplasm of unspecified site of right female breast: Secondary | ICD-10-CM | POA: Diagnosis not present

## 2024-06-22 DIAGNOSIS — H9190 Unspecified hearing loss, unspecified ear: Secondary | ICD-10-CM | POA: Insufficient documentation

## 2024-06-22 DIAGNOSIS — K219 Gastro-esophageal reflux disease without esophagitis: Secondary | ICD-10-CM | POA: Diagnosis not present

## 2024-06-22 DIAGNOSIS — E669 Obesity, unspecified: Secondary | ICD-10-CM | POA: Insufficient documentation

## 2024-06-22 DIAGNOSIS — Z803 Family history of malignant neoplasm of breast: Secondary | ICD-10-CM | POA: Insufficient documentation

## 2024-06-22 DIAGNOSIS — D631 Anemia in chronic kidney disease: Secondary | ICD-10-CM | POA: Diagnosis not present

## 2024-06-22 DIAGNOSIS — E782 Mixed hyperlipidemia: Secondary | ICD-10-CM | POA: Diagnosis not present

## 2024-06-22 DIAGNOSIS — J4540 Moderate persistent asthma, uncomplicated: Secondary | ICD-10-CM | POA: Diagnosis not present

## 2024-06-22 DIAGNOSIS — J939 Pneumothorax, unspecified: Secondary | ICD-10-CM | POA: Diagnosis not present

## 2024-06-22 HISTORY — PX: PORTACATH PLACEMENT: SHX2246

## 2024-06-22 SURGERY — INSERTION, TUNNELED CENTRAL VENOUS DEVICE, WITH PORT
Anesthesia: General | Laterality: Left

## 2024-06-22 MED ORDER — CHLORHEXIDINE GLUCONATE 0.12 % MT SOLN
OROMUCOSAL | Status: AC
  Filled 2024-06-22: qty 15

## 2024-06-22 MED ORDER — HEPARIN SODIUM (PORCINE) 5000 UNIT/ML IJ SOLN
INTRAMUSCULAR | Status: AC
Start: 2024-06-22 — End: 2024-06-22
  Filled 2024-06-22: qty 1

## 2024-06-22 MED ORDER — CEFAZOLIN SODIUM-DEXTROSE 2-4 GM/100ML-% IV SOLN
INTRAVENOUS | Status: AC
Start: 2024-06-22 — End: 2024-06-22
  Filled 2024-06-22: qty 100

## 2024-06-22 MED ORDER — PROPOFOL 10 MG/ML IV BOLUS
INTRAVENOUS | Status: AC
  Filled 2024-06-22: qty 20

## 2024-06-22 MED ORDER — GLYCOPYRROLATE 0.2 MG/ML IJ SOLN
INTRAMUSCULAR | Status: DC | PRN
  Administered 2024-06-22: .2 mg via INTRAVENOUS

## 2024-06-22 MED ORDER — OXYCODONE HCL 5 MG PO TABS
ORAL_TABLET | ORAL | Status: AC
  Filled 2024-06-22: qty 1

## 2024-06-22 MED ORDER — ACETAMINOPHEN 10 MG/ML IV SOLN
INTRAVENOUS | Status: DC | PRN
Start: 2024-06-22 — End: 2024-06-22
  Administered 2024-06-22: 1000 mg via INTRAVENOUS

## 2024-06-22 MED ORDER — OXYCODONE HCL 5 MG PO TABS
5.0000 mg | ORAL_TABLET | Freq: Once | ORAL | Status: AC | PRN
  Administered 2024-06-22: 5 mg via ORAL

## 2024-06-22 MED ORDER — EPHEDRINE SULFATE (PRESSORS) 25 MG/5ML IV SOSY
PREFILLED_SYRINGE | INTRAVENOUS | Status: DC | PRN
  Administered 2024-06-22: 10 mg via INTRAVENOUS

## 2024-06-22 MED ORDER — MIDAZOLAM HCL (PF) 2 MG/2ML IJ SOLN
INTRAMUSCULAR | Status: DC | PRN
  Administered 2024-06-22: 2 mg via INTRAVENOUS

## 2024-06-22 MED ORDER — BUPIVACAINE-EPINEPHRINE (PF) 0.5% -1:200000 IJ SOLN
INTRAMUSCULAR | Status: DC | PRN
  Administered 2024-06-22: 10 mL via PERINEURAL
  Administered 2024-06-22: 20 mL via PERINEURAL

## 2024-06-22 MED ORDER — HEPARIN 5000 UNITS IN NS 1000 ML (FLUSH)
INTRAMUSCULAR | Status: DC | PRN
  Administered 2024-06-22: 30 mL via INTRAMUSCULAR

## 2024-06-22 MED ORDER — ONDANSETRON HCL 4 MG/2ML IJ SOLN
INTRAMUSCULAR | Status: DC | PRN
  Administered 2024-06-22: 4 mg via INTRAVENOUS

## 2024-06-22 MED ORDER — DEXAMETHASONE SOD PHOSPHATE PF 10 MG/ML IJ SOLN
INTRAMUSCULAR | Status: DC | PRN
Start: 2024-06-22 — End: 2024-06-22
  Administered 2024-06-22: 10 mg via INTRAVENOUS

## 2024-06-22 MED ORDER — FENTANYL CITRATE (PF) 100 MCG/2ML IJ SOLN
INTRAMUSCULAR | Status: DC | PRN
  Administered 2024-06-22: 75 ug via INTRAVENOUS
  Administered 2024-06-22: 25 ug via INTRAVENOUS

## 2024-06-22 MED ORDER — OXYCODONE HCL 5 MG/5ML PO SOLN: 5.0000 mg | Freq: Once | ORAL | Status: AC | PRN

## 2024-06-22 MED ORDER — LACTATED RINGERS IV SOLN: INTRAVENOUS | Status: DC

## 2024-06-22 MED ORDER — PHENYLEPHRINE 80 MCG/ML (10ML) SYRINGE FOR IV PUSH (FOR BLOOD PRESSURE SUPPORT)
PREFILLED_SYRINGE | INTRAVENOUS | Status: DC | PRN
Start: 2024-06-22 — End: 2024-06-22
  Administered 2024-06-22: 80 ug via INTRAVENOUS

## 2024-06-22 MED ORDER — MIDAZOLAM HCL 2 MG/2ML IJ SOLN
INTRAMUSCULAR | Status: AC
  Filled 2024-06-22: qty 2

## 2024-06-22 MED ORDER — BUPIVACAINE-EPINEPHRINE (PF) 0.5% -1:200000 IJ SOLN
INTRAMUSCULAR | Status: AC
  Filled 2024-06-22: qty 30

## 2024-06-22 MED ORDER — FENTANYL CITRATE (PF) 100 MCG/2ML IJ SOLN: 25.0000 ug | INTRAMUSCULAR | Status: DC | PRN

## 2024-06-22 MED ORDER — FENTANYL CITRATE (PF) 100 MCG/2ML IJ SOLN
INTRAMUSCULAR | Status: AC
  Filled 2024-06-22: qty 2

## 2024-06-22 MED ORDER — ACETAMINOPHEN 10 MG/ML IV SOLN: 1000.0000 mg | Freq: Once | INTRAVENOUS | Status: DC | PRN

## 2024-06-22 MED ORDER — ACETAMINOPHEN 10 MG/ML IV SOLN
INTRAVENOUS | Status: AC
  Filled 2024-06-22: qty 100

## 2024-06-22 MED ORDER — ONDANSETRON HCL 4 MG/2ML IJ SOLN: 4.0000 mg | Freq: Once | INTRAMUSCULAR | Status: DC | PRN

## 2024-06-22 MED ORDER — PHENYLEPHRINE HCL-NACL 20-0.9 MG/250ML-% IV SOLN
INTRAVENOUS | Status: AC
  Filled 2024-06-22: qty 250

## 2024-06-22 MED ORDER — LIDOCAINE HCL (CARDIAC) PF 100 MG/5ML IV SOSY
PREFILLED_SYRINGE | INTRAVENOUS | Status: DC | PRN
  Administered 2024-06-22: 100 mg via INTRAVENOUS

## 2024-06-22 MED ORDER — PROPOFOL 10 MG/ML IV BOLUS
INTRAVENOUS | Status: DC | PRN
  Administered 2024-06-22: 200 mg via INTRAVENOUS
  Administered 2024-06-22: 30 mg via INTRAVENOUS

## 2024-06-22 MED ORDER — PROPOFOL 1000 MG/100ML IV EMUL
INTRAVENOUS | Status: AC
  Filled 2024-06-22: qty 100

## 2024-06-22 SURGICAL SUPPLY — 27 items
BAG DECANTER FOR FLEXI CONT (MISCELLANEOUS) ×1 IMPLANT
BLADE SURG SZ11 CARB STEEL (BLADE) ×1 IMPLANT
CHLORAPREP W/TINT 26 (MISCELLANEOUS) IMPLANT
DERMABOND ADVANCED .7 DNX12 (GAUZE/BANDAGES/DRESSINGS) ×1 IMPLANT
DRAPE C-ARM XRAY 36X54 (DRAPES) ×1 IMPLANT
ELECTRODE REM PT RTRN 9FT ADLT (ELECTROSURGICAL) ×1 IMPLANT
GEL ULTRASOUND 20GR AQUASONIC (MISCELLANEOUS) ×1 IMPLANT
GLOVE BIO SURGEON STRL SZ7 (GLOVE) ×1 IMPLANT
GLOVE BIOGEL PI IND STRL 7.5 (GLOVE) ×1 IMPLANT
GLOVE SURG SYN 7.0 PF PI (GLOVE) ×1 IMPLANT
GOWN STRL REUS W/ TWL LRG LVL3 (GOWN DISPOSABLE) ×2 IMPLANT
IV 0.9% NACL 500 ML (IV SOLUTION) ×1 IMPLANT
KIT PORT INFUSION SMART 8FR (Port) ×1 IMPLANT
MANIFOLD NEPTUNE II (INSTRUMENTS) ×1 IMPLANT
NDL HYPO 22X1.5 SAFETY MO (MISCELLANEOUS) ×1 IMPLANT
NEEDLE HYPO 22X1.5 SAFETY MO (MISCELLANEOUS) ×1 IMPLANT
PACK PORT-A-CATH (MISCELLANEOUS) ×1 IMPLANT
STAPLER SKIN PROX 35W (STAPLE) ×1 IMPLANT
SUT MNCRL AB 4-0 PS2 18 (SUTURE) ×1 IMPLANT
SUT PROLENE 2 0 SH DA (SUTURE) IMPLANT
SUT VIC AB 3-0 SH 27X BRD (SUTURE) ×1 IMPLANT
SUTURE PROLEN 2-0 RB1 36X2 ARM (SUTURE) ×1 IMPLANT
SYR 10ML LL (SYRINGE) ×1 IMPLANT
SYR 5ML LL (SYRINGE) ×1 IMPLANT
TOWEL OR 17X26 4PK STRL BLUE (TOWEL DISPOSABLE) IMPLANT
TRAP FLUID SMOKE EVACUATOR (MISCELLANEOUS) ×1 IMPLANT
WATER STERILE IRR 500ML POUR (IV SOLUTION) ×1 IMPLANT

## 2024-06-22 NOTE — Anesthesia Postprocedure Evaluation (Signed)
 Anesthesia Post Note  Patient: Caitlin [name]  Procedure(s) Performed: INSERTION, TUNNELED CENTRAL VENOUS DEVICE, WITH PORT (Left)  Patient location during evaluation: PACU Anesthesia Type: General Level of consciousness: awake and alert, oriented and patient cooperative Pain management: pain level controlled Vital Signs Assessment: post-procedure vital signs reviewed and stable Respiratory status: spontaneous breathing, nonlabored ventilation and respiratory function stable Cardiovascular status: blood pressure returned to baseline and stable Postop Assessment: adequate PO intake Anesthetic complications: no   There were no known notable events for this encounter.   Last Vitals:  Vitals:   06/22/24 1000 06/22/24 1015  BP: (!) 165/84 (!) 150/85  Pulse: 100 (!) 104  Resp: 17 14  Temp:  36.6 C  SpO2: 99% 94%    Last Pain:  Vitals:   06/22/24 1015  TempSrc: Temporal  PainSc: 0-No pain                 Carola Viramontes

## 2024-06-22 NOTE — H&P (Signed)
 No change to below H and P, proceed with port-a-cath placement as planned  CC: Right Breast Cancer History of Present Illness Caitlin [name] is a 58 y.o. female with past medical history significant for hypertension who presents in consultation for right breast cancer.  The patient reports that several weeks ago she noticed a nodule in her right breast.  She also said this was associated with right breast pain.  She denies any overlying skin changes or nipple discharge.  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  The patient does report a history of birth control.  She has been through menopause.  She does have a sister that had breast cancer.  She first had menses at age 46 and she is a G4, P3.SABRA   History obtained with aid of interpreter as patient is deaft.  Past Medical History     Past Medical History:  Diagnosis Date   Asthma     Deaf     GERD (gastroesophageal reflux disease)     Hypertension 2011   NO (nasal obstruction)     OSA (obstructive sleep apnea)                   Past Surgical History:  Procedure Laterality Date   BREAST BIOPSY Right 06/08/2024    US  RT BREAST BX W LOC DEV 1ST LESION IMG BX SPEC US  GUIDE 06/08/2024 [facility]   CESAREAN SECTION       CHOLECYSTECTOMY       DENTAL SURGERY Bilateral 04/2024    Removed both fang teeth on the top and wisdom as well.   TUBAL LIGATION              Allergies      Allergies  Allergen Reactions   Egg Protein-Containing Drug Products Nausea And Vomiting   Milk-Related Compounds                Current Outpatient Medications  Medication Sig Dispense Refill   albuterol  (VENTOLIN  HFA) 108 (90 Base) MCG/ACT inhaler Inhale 2 puffs into the lungs every 6 (six) hours as needed for wheezing or shortness of breath. 18 g 6   amLODipine  (NORVASC ) 10 MG tablet TAKE 1 TABLET BY MOUTH  ONCE DAILY. TO LOWER BLOOD PRESSURE 90 tablet 3   atorvastatin  (LIPITOR) 10 MG tablet Take 1 tablet (10 mg total) by mouth daily. 90 tablet 3   Blood Pressure Monitor KIT Use to check blood pressure daily 1 kit 0   budesonide -formoterol  (SYMBICORT ) 80-4.5 MCG/ACT inhaler Inhale 2 puffs into the lungs in the morning and at bedtime. 1 each 2   carvedilol  (COREG ) 3.125 MG tablet Take 1 tablet (3.125 mg total) by mouth 2 (two) times daily with a meal. 180 tablet 3   famotidine  (PEPCID ) 20 MG tablet Take 1 tablet (20 mg total) by mouth daily before breakfast. 90 tablet 3   levocetirizine (XYZAL ) 5 MG tablet Take 1 tablet (5 mg total) by mouth every evening. 90 tablet 3   lidocaine-prilocaine (EMLA) cream Apply to affected area once 30 g 3   Misc. Devices MISC CPAP therapy on 17 cm H2O . Patient to use X-Small size Resmed Full Face Mask AirFit F10. Diagnosis - Obstructive sleep apnea 1 each 0   ondansetron  (ZOFRAN ) 8 MG tablet Take by mouth every 8 (eight) hours as needed for nausea or  vomiting.       prochlorperazine (COMPAZINE) 10 MG tablet Take 10 mg by mouth every 6 (six) hours as needed for nausea or vomiting.       valsartan -hydrochlorothiazide  (DIOVAN -HCT) 320-25 MG tablet Take 1 tablet by mouth daily. 90 tablet 3   Vitamin D , Ergocalciferol , (DRISDOL ) 1.25 MG (50000 UNIT) CAPS capsule Take 1 capsule (50,000 Units total) by mouth every 7 (seven) days. 12 capsule 1      No current facility-administered medications for this visit.        Family History      Family History  Problem Relation Age of Onset   Heart disease Mother     Stroke Mother     Colon cancer Father     Breast cancer Sister          1/2 sister   Brain cancer Brother              Social History Social History  Social History         Tobacco Use   Smoking status: Never   Smokeless tobacco: Never  Vaping Use   Vaping status: Never Used  Substance Use Topics   Alcohol use: Yes      Alcohol/week: 1.0 standard  drink of alcohol      Types: 1 Glasses of wine per week      Comment: occassionally    Drug use: Not Currently      Types: Crack cocaine            ROS Full ROS of systems performed and is otherwise negative there than what is stated in the HPI   Physical Exam Blood pressure (!) 148/76, pulse 62, temperature 98.2 F (36.8 C), temperature source Oral, height 5' 4 (1.626 m), weight 189 lb 12.8 oz (86.1 kg), last menstrual period 05/08/2016, SpO2 99%.   Alert and oriented x 3, normal work of breathing on room air, clear to auscultation bilaterally, regular rate and rhythm, no neck or upper chest scars, left breast exam performed in the presence of a chaperone.  There is no left axillary lymphadenopathy, skin changes or dominant masses within the left breast.  In the right breast there is no axillary lymphadenopathy.  In the upper outer quadrant of the right breast there is a palpable nodule that is tender to palpation, there is no overlying skin changes. Data Reviewed Mammogram personally reviewed.  She has an area of concern in the right upper outer quadrant.  I also reviewed Dr. Jerone note and the plan is for chemotherapy.   I have personally reviewed the patient's imaging and medical records.     Assessment Assessment Patient with invasive ductal carcinoma of the right breast.   Plan Plan She is undergoing chemotherapy.  Will plan for port placement.  I discussed risk, benefits alternatives the procedure including risk of infection, bleeding, port infection, pneumothorax and damage to adjacent structures.  She understands these risks and wishes to proceed with surgery.       Jayson MALVA Endow 06/20/2024, 11:24 AM

## 2024-06-22 NOTE — Transfer of Care (Signed)
 Immediate Anesthesia Transfer of Care Note  Patient: Caitlin [name]  Procedure(s) Performed: INSERTION, TUNNELED CENTRAL VENOUS DEVICE, WITH PORT (Left)  Patient Location: PACU  Anesthesia Type:General  Level of Consciousness: awake  Airway & Oxygen Therapy: Patient Spontanous Breathing  Post-op Assessment: Report given to RN and Post -op Vital signs reviewed and stable  Post vital signs: stable  Last Vitals:  Vitals Value Taken Time  BP 153/81 06/22/24 09:35  Temp    Pulse 122 06/22/24 09:39  Resp 16 06/22/24 09:39  SpO2 98 % 06/22/24 09:39  Vitals shown include unfiled device data.  Last Pain:  Vitals:   06/22/24 0721  TempSrc: Temporal  PainSc: 5          Complications: No notable events documented.

## 2024-06-22 NOTE — Anesthesia Preprocedure Evaluation (Addendum)
 Anesthesia Evaluation  Patient identified by MRN, date of birth, ID band Patient awake    Reviewed: Allergy & Precautions, NPO status , Patient's Chart, lab work & pertinent test results  History of Anesthesia Complications Negative for: history of anesthetic complications  Airway Mallampati: III   Neck ROM: Full    Dental no notable dental hx.    Pulmonary asthma , sleep apnea    Pulmonary exam normal breath sounds clear to auscultation       Cardiovascular hypertension, Normal cardiovascular exam Rhythm:Regular Rate:Normal  ECG 12/24/23:  Normal sinus rhythm Inferior infarct , age undetermined Anterior infarct   Neuro/Psych  Headaches    GI/Hepatic ,GERD  ,,  Endo/Other  Obesity   Renal/GU Renal disease (stage II CKD)     Musculoskeletal   Abdominal   Peds  Hematology Breast CA   Anesthesia Other Findings Cardiology note 12/24/23:  1. Palpitations  2. Precordial pain  3. Primary hypertension  4. Pure hypercholesterolemia    PLAN:    In order of problems listed above:   1. Palpitations, cardiac monitor 10/2023 1 run of NSVT lasting 7 beats.  No sustained arrhythmias.  Echo 10/2023 EF 60 to 65%.  No significant structural abnormalities.  Symptoms currently resolved.  Patient reassured. 2. Chest pain, coinciding with palpitations, not associated with exertion.  Echo with no significant structural abnormalities.  Complains of GERD.  Advised to take OTC omeprazole  40 mg daily.  Follow-up with PCP for management of reflux symptoms. 3. Hypertension, BP  controlled/low normal.    Continue Diovan  HCT 320-25 mg daily, Norvasc  10 mg daily, Coreg  3.125 mg twice daily. 4. Hyperlipidemia, continue Lipitor 10 mg daily.     Follow-up as needed   Reproductive/Obstetrics                              Anesthesia Physical Anesthesia Plan  ASA: 2  Anesthesia Plan: General   Post-op Pain Management:     Induction: Intravenous  PONV Risk Score and Plan: 3 and Ondansetron , Dexamethasone and Treatment may vary due to age or medical condition  Airway Management Planned: LMA  Additional Equipment:   Intra-op Plan:   Post-operative Plan: Extubation in OR  Informed Consent: I have reviewed the patients History and Physical, chart, labs and discussed the procedure including the risks, benefits and alternatives for the proposed anesthesia with the patient or authorized representative who has indicated his/her understanding and acceptance.     Dental advisory given  Plan Discussed with: CRNA  Anesthesia Plan Comments: (Daughter at bedside for ASL translation per patient request. Patient consented for risks of anesthesia including but not limited to:  - adverse reactions to medications - damage to eyes, teeth, lips or other oral mucosa - nerve damage due to positioning  - sore throat or hoarseness - damage to heart, brain, nerves, lungs, other parts of body or loss of life  Informed patient about role of CRNA in peri- and intra-operative care.  Patient voiced understanding.)         Anesthesia Quick Evaluation

## 2024-06-22 NOTE — Op Note (Signed)
 SURGICAL PROCEDURE REPORT  DATE OF PROCEDURE: 06/22/2024   ATTENDING SURGEON: Jayson Endow, MD  ANESTHESIA: GEA  PRE-OPERATIVE DIAGNOSIS: Breast Cancer  POST-OPERATIVE DIAGNOSIS: Breast Cancer  PROCEDURE(S): 1.) Percutaneous access of LeftLeft internal jugular vein under ultrasound guidance  3.) Insertion of tunneled Left internal jugular  PowerPort central venous catheter with subcutaneous port under fluoroscopic guidance  INTRAOPERATIVE FINDINGS: Patent easily compressible Left internal jugular vein with appropriate respiratory variations and well-secured tunneled central venous catheter with subcutaneous port at completion of the procedure  ESTIMATED BLOOD LOSS: Minimal (<20 mL)   SPECIMENS: None   IMPLANTS: 11F tunneled PowerPort central venous catheter with subcutaneous port  DRAINS: None   COMPLICATIONS: None apparent   CONDITION AT COMPLETION: Hemodynamically stable, awake   DISPOSITION: PACU   INDICATION(S) FOR PROCEDURE:  Patient is a 58 y.o. female who presented with breast  cancer requiring durable central venous access for chemotherapy. All risks, benefits, and alternatives to above elective procedures were discussed with the patient, who elected to proceed, and informed consent was accordingly obtained at that time.  DETAILS OF PROCEDURE:  Patient was brought to the operative suite and appropriately identified. Left IJ venous access site and planned port placement site were prepped and draped in the usual sterile fashion. Following a brief timeout, limited duplex evaluation of the left vein was performed, . In Trendelenburg position, percutaneous left IJ* venous access was obtained under ultrasound guidance using Seldinger technique, access needle was inserted under direct ultrasound visualization into the LEFT internal jugular vein, through which soft guidewire was advanced, over which access needle was withdrawn. Guidewire was secured, attention was directed to  injection of local anesthetic along the planned tunnel site, 2-3 cm transverse   chest incision was made and confirmed to accommodate the subcutaneous port, and flushed catheter was tunneled retrograde from the access site to the port pocket. The catheter was attached to the port well-secured to the catheter and within the subcutaneous pocket. The port was then secured to the pectoralis major fascia with 2-0 prolene suture in the middle and lateral spots. Insertion sheath was advanced over the guidewire, which was withdrawn along with the insertion sheath dilator. Length of catheter needed to position the catheter tip at the atrio-caval junction was then measured under direct fluoroscopic visualization, after which the catheter was cut to the measured length and advanced through the sheath into the left internal jugular vein and SVC without evidence of cardiac arrhythmias during the procedure. Port was confirmed to withdraw blood and flush easily, after which concentrated heparin was instilled into the port and catheter. Dermis at the subcutaneous pocket was re-approximated using buried interrupted 3-0 Vicryl suture, and 4-0 Vicryl suture was used to re-approximate skin at the insertion and subcutaneous port sites in running subcuticular fashion for the subcutaneous port and buried interrupted fashion for the insertion site. Skin was cleaned, dried, and sterile skin glue was applied. Patient was then safely transferred to PACU for a chest x-ray.  I was present for all aspects of the procedures, and no intraprocedural complications were apparent

## 2024-06-23 ENCOUNTER — Encounter: Payer: Self-pay | Admitting: General Surgery

## 2024-06-23 ENCOUNTER — Inpatient Hospital Stay: Attending: Oncology

## 2024-06-23 ENCOUNTER — Inpatient Hospital Stay

## 2024-06-23 ENCOUNTER — Other Ambulatory Visit: Payer: Self-pay

## 2024-06-23 DIAGNOSIS — Z170 Estrogen receptor positive status [ER+]: Secondary | ICD-10-CM | POA: Insufficient documentation

## 2024-06-23 DIAGNOSIS — Z803 Family history of malignant neoplasm of breast: Secondary | ICD-10-CM | POA: Insufficient documentation

## 2024-06-23 DIAGNOSIS — G4730 Sleep apnea, unspecified: Secondary | ICD-10-CM | POA: Insufficient documentation

## 2024-06-23 DIAGNOSIS — I10 Essential (primary) hypertension: Secondary | ICD-10-CM | POA: Insufficient documentation

## 2024-06-23 DIAGNOSIS — K219 Gastro-esophageal reflux disease without esophagitis: Secondary | ICD-10-CM | POA: Insufficient documentation

## 2024-06-23 DIAGNOSIS — Z5189 Encounter for other specified aftercare: Secondary | ICD-10-CM | POA: Insufficient documentation

## 2024-06-23 DIAGNOSIS — E876 Hypokalemia: Secondary | ICD-10-CM | POA: Insufficient documentation

## 2024-06-23 DIAGNOSIS — Z808 Family history of malignant neoplasm of other organs or systems: Secondary | ICD-10-CM | POA: Insufficient documentation

## 2024-06-23 DIAGNOSIS — Z800 Family history of malignant neoplasm of digestive organs: Secondary | ICD-10-CM | POA: Insufficient documentation

## 2024-06-23 DIAGNOSIS — C50411 Malignant neoplasm of upper-outer quadrant of right female breast: Secondary | ICD-10-CM | POA: Insufficient documentation

## 2024-06-23 DIAGNOSIS — Z1722 Progesterone receptor negative status: Secondary | ICD-10-CM | POA: Insufficient documentation

## 2024-06-23 DIAGNOSIS — Z1732 Human epidermal growth factor receptor 2 negative status: Secondary | ICD-10-CM | POA: Insufficient documentation

## 2024-06-23 DIAGNOSIS — Z5111 Encounter for antineoplastic chemotherapy: Secondary | ICD-10-CM | POA: Insufficient documentation

## 2024-06-23 DIAGNOSIS — D72829 Elevated white blood cell count, unspecified: Secondary | ICD-10-CM | POA: Insufficient documentation

## 2024-06-23 DIAGNOSIS — J449 Chronic obstructive pulmonary disease, unspecified: Secondary | ICD-10-CM | POA: Insufficient documentation

## 2024-06-23 DIAGNOSIS — Z79899 Other long term (current) drug therapy: Secondary | ICD-10-CM | POA: Insufficient documentation

## 2024-06-23 DIAGNOSIS — J4489 Other specified chronic obstructive pulmonary disease: Secondary | ICD-10-CM | POA: Insufficient documentation

## 2024-06-23 DIAGNOSIS — Z7951 Long term (current) use of inhaled steroids: Secondary | ICD-10-CM | POA: Insufficient documentation

## 2024-06-23 NOTE — Progress Notes (Addendum)
 Pharmacist Chemotherapy Monitoring - Initial Assessment    Anticipated start date: 06/28/24   The following has been reviewed per standard work regarding the patient's treatment regimen: The patient's diagnosis, treatment plan and drug doses, and organ/hematologic function Lab orders and baseline tests specific to treatment regimen  The treatment plan start date, drug sequencing, and pre-medications Prior authorization status  Patient's documented medication list, including drug-drug interaction screen and prescriptions for anti-emetics and supportive care specific to the treatment regimen The drug concentrations, fluid compatibility, administration routes, and timing of the medications to be used The patient's access for treatment and lifetime cumulative dose history, if applicable  The patient's medication allergies and previous infusion related reactions, if applicable   Changes made to treatment plan:  N/A  Follow up needed:  Echo 10/2023 EF 60 to 65%., MUGA 11/17: EF 57.4%  Ensure patient has home anti-emetics  Caitlin [name], [facility], 06/23/2024  4:13 PM

## 2024-06-23 NOTE — Progress Notes (Signed)
 [facility] CSW Progress Note  Clinical Social Work introduced self to patient during Patient Education with Raoul Moats, [facility].  Provided information regarding CSW role, including counseling, advanced care planning and support group.  Sign Language Interpreter was present.  Answered questions as needed.   Follow Up Plan:  CSW will follow-up with patient by phone     Macario CHRISTELLA Au, LCSW Clinical Social Worker [facility]    Patient is participating in a Managed Medicaid Plan:  Yes

## 2024-06-24 ENCOUNTER — Other Ambulatory Visit: Payer: Self-pay | Admitting: Oncology

## 2024-06-24 ENCOUNTER — Encounter: Payer: Self-pay | Admitting: Oncology

## 2024-06-24 ENCOUNTER — Encounter
Admission: RE | Admit: 2024-06-24 | Discharge: 2024-06-24 | Disposition: A | Discharge status: Home / Self Care | Source: Ambulatory Visit | Attending: Oncology | Admitting: Oncology

## 2024-06-24 ENCOUNTER — Emergency Department

## 2024-06-24 ENCOUNTER — Ambulatory Visit: Admitting: Internal Medicine

## 2024-06-24 ENCOUNTER — Other Ambulatory Visit: Payer: Self-pay

## 2024-06-24 ENCOUNTER — Observation Stay
Admission: EM | Admit: 2024-06-24 | Discharge: 2024-06-27 | Disposition: A | Discharge status: Home / Self Care | Attending: Obstetrics and Gynecology | Admitting: Obstetrics and Gynecology

## 2024-06-24 DIAGNOSIS — C50911 Malignant neoplasm of unspecified site of right female breast: Secondary | ICD-10-CM

## 2024-06-24 DIAGNOSIS — H9193 Unspecified hearing loss, bilateral: Secondary | ICD-10-CM | POA: Diagnosis present

## 2024-06-24 DIAGNOSIS — R457 State of emotional shock and stress, unspecified: Secondary | ICD-10-CM | POA: Diagnosis not present

## 2024-06-24 DIAGNOSIS — R079 Chest pain, unspecified: Secondary | ICD-10-CM | POA: Diagnosis not present

## 2024-06-24 DIAGNOSIS — Z79899 Other long term (current) drug therapy: Secondary | ICD-10-CM | POA: Insufficient documentation

## 2024-06-24 DIAGNOSIS — R0789 Other chest pain: Principal | ICD-10-CM | POA: Insufficient documentation

## 2024-06-24 DIAGNOSIS — Z5111 Encounter for antineoplastic chemotherapy: Secondary | ICD-10-CM | POA: Diagnosis not present

## 2024-06-24 DIAGNOSIS — G4733 Obstructive sleep apnea (adult) (pediatric): Secondary | ICD-10-CM | POA: Insufficient documentation

## 2024-06-24 DIAGNOSIS — Z6834 Body mass index (BMI) 34.0-34.9, adult: Secondary | ICD-10-CM | POA: Insufficient documentation

## 2024-06-24 DIAGNOSIS — R7989 Other specified abnormal findings of blood chemistry: Secondary | ICD-10-CM | POA: Diagnosis not present

## 2024-06-24 DIAGNOSIS — Z7901 Long term (current) use of anticoagulants: Secondary | ICD-10-CM | POA: Diagnosis not present

## 2024-06-24 DIAGNOSIS — R0989 Other specified symptoms and signs involving the circulatory and respiratory systems: Secondary | ICD-10-CM | POA: Diagnosis not present

## 2024-06-24 DIAGNOSIS — E669 Obesity, unspecified: Secondary | ICD-10-CM | POA: Insufficient documentation

## 2024-06-24 DIAGNOSIS — R002 Palpitations: Secondary | ICD-10-CM | POA: Diagnosis present

## 2024-06-24 DIAGNOSIS — I10 Essential (primary) hypertension: Secondary | ICD-10-CM | POA: Diagnosis not present

## 2024-06-24 DIAGNOSIS — Z8659 Personal history of other mental and behavioral disorders: Secondary | ICD-10-CM | POA: Diagnosis not present

## 2024-06-24 DIAGNOSIS — I517 Cardiomegaly: Secondary | ICD-10-CM | POA: Diagnosis not present

## 2024-06-24 DIAGNOSIS — Z7982 Long term (current) use of aspirin: Secondary | ICD-10-CM | POA: Diagnosis not present

## 2024-06-24 DIAGNOSIS — J4540 Moderate persistent asthma, uncomplicated: Secondary | ICD-10-CM | POA: Diagnosis present

## 2024-06-24 DIAGNOSIS — Z122 Encounter for screening for malignant neoplasm of respiratory organs: Secondary | ICD-10-CM | POA: Diagnosis not present

## 2024-06-24 DIAGNOSIS — R778 Other specified abnormalities of plasma proteins: Secondary | ICD-10-CM | POA: Diagnosis not present

## 2024-06-24 DIAGNOSIS — R9389 Abnormal findings on diagnostic imaging of other specified body structures: Secondary | ICD-10-CM | POA: Diagnosis not present

## 2024-06-24 DIAGNOSIS — E66811 Obesity, class 1: Secondary | ICD-10-CM | POA: Diagnosis present

## 2024-06-24 HISTORY — DX: Chronic obstructive pulmonary disease, unspecified: J44.9

## 2024-06-24 LAB — CBC
HCT: 36.2 % (ref 36.0–46.0)
Hemoglobin: 11.8 g/dL — ABNORMAL LOW (ref 12.0–15.0)
MCH: 29.6 pg (ref 26.0–34.0)
MCHC: 32.6 g/dL (ref 30.0–36.0)
MCV: 91 fL (ref 80.0–100.0)
Platelets: 394 K/uL (ref 150–400)
RBC: 3.98 MIL/uL (ref 3.87–5.11)
RDW: 12.6 % (ref 11.5–15.5)
WBC: 9.5 K/uL (ref 4.0–10.5)
nRBC: 0 % (ref 0.0–0.2)

## 2024-06-24 LAB — BASIC METABOLIC PANEL WITH GFR
Anion gap: 14 (ref 5–15)
BUN: 19 mg/dL (ref 6–20)
CO2: 21 mmol/L — ABNORMAL LOW (ref 22–32)
Calcium: 8.9 mg/dL (ref 8.9–10.3)
Chloride: 102 mmol/L (ref 98–111)
Creatinine, Ser: 0.93 mg/dL (ref 0.44–1.00)
GFR, Estimated: >60 mL/min (ref >60)
Glucose, Bld: 143 mg/dL — ABNORMAL HIGH (ref 70–99)
Potassium: 3.7 mmol/L (ref 3.5–5.1)
Sodium: 137 mmol/L (ref 135–145)

## 2024-06-24 LAB — TSH: TSH: 2.341 u[IU]/mL (ref 0.350–4.500)

## 2024-06-24 LAB — T4, FREE: Free T4: 1.39 ng/dL — ABNORMAL HIGH (ref 0.61–1.12)

## 2024-06-24 LAB — MAGNESIUM: Magnesium: 2.2 mg/dL (ref 1.7–2.4)

## 2024-06-24 LAB — TROPONIN I (HIGH SENSITIVITY)
Troponin I (High Sensitivity): 14 ng/L (ref <18)
Troponin I (High Sensitivity): 31 ng/L — ABNORMAL HIGH (ref <18)

## 2024-06-24 MED ORDER — ASPIRIN 81 MG PO CHEW
324.0000 mg | CHEWABLE_TABLET | Freq: Once | ORAL | Status: AC
  Administered 2024-06-25: 324 mg via ORAL
  Filled 2024-06-24: qty 4

## 2024-06-24 MED ORDER — TECHNETIUM TC 99M-LABELED RED BLOOD CELLS IV KIT
20.0000 | PACK | Freq: Once | INTRAVENOUS | Status: AC | PRN
  Administered 2024-06-24: 22.19 via INTRAVENOUS

## 2024-06-24 MED ORDER — IOHEXOL 350 MG/ML SOLN
75.0000 mL | Freq: Once | INTRAVENOUS | Status: AC | PRN
  Administered 2024-06-24: 75 mL via INTRAVENOUS

## 2024-06-24 MED ORDER — ALUM & MAG HYDROXIDE-SIMETH 200-200-20 MG/5ML PO SUSP
30.0000 mL | Freq: Once | ORAL | Status: AC
  Administered 2024-06-24: 30 mL via ORAL
  Filled 2024-06-24: qty 30

## 2024-06-24 NOTE — Progress Notes (Signed)
 Returned patient's daughter phone call-patient currently in the emergency room for workup of tachycardia.

## 2024-06-24 NOTE — ED Provider Notes (Signed)
 [facility] Provider Note    Event Date/Time   First MD Initiated Contact with Patient 06/24/24 1828     (approximate)   History   Chest Pain (Recently dx with breast cancer, has sternal pain not chest pain. Rates pain as 9/10. Denies any nausea. BGL 132 mg/dl for EMS. )   HPI  Caitlin [name] is a 58 year old female, deaf, history obtained with ASL video interpreter, SVT, recently identified breast cancer presenting to the emergency department for evaluation of chest pain.  Patient reports that shortly prior to presentation she had a 30-minute episode of chest pain with associated palpitations. Pain resolved at the time of my initial evaluation.  Denies history of similar.     Physical Exam   Triage Vital Signs: ED Triage Vitals  Encounter Vitals Group     BP 06/24/24 1836 (!) 141/77     Girls Systolic BP Percentile --      Girls Diastolic BP Percentile --      Boys Systolic BP Percentile --      Boys Diastolic BP Percentile --      Pulse Rate 06/24/24 1845 99     Resp 06/24/24 1836 15     Temp 06/24/24 1838 98.7 F (37.1 C)     Temp Source 06/24/24 1838 Oral     SpO2 06/24/24 1845 100 %     Weight 06/24/24 1835 190 lb 11.2 oz (86.5 kg)     Height 06/24/24 1835 5' 4 (1.626 m)     Head Circumference --      Peak Flow --      Pain Score 06/24/24 1835 9     Pain Loc --      Pain Education --      Exclude from Growth Chart --     Most recent vital signs: Vitals:   06/24/24 2100 06/24/24 2250  BP: 138/75 121/73  Pulse: 79 97  Resp: 17 (!) 25  Temp:  98.2 F (36.8 C)  SpO2: 100% 97%    General: Awake, interactive  CV:  Good peripheral perfusion Resp:  Unlabored respirations, lungs clear to auscultation Abd:  Nondistended.  Neuro:  Symmetric facial movement, fluid speech   ED Results / Procedures / Treatments   Labs (all labs ordered are listed, but only abnormal results are displayed) Labs Reviewed  BASIC METABOLIC PANEL  WITH GFR - Abnormal; Notable for the following components:      Result Value   CO2 21 (*)    Glucose, Bld 143 (*)    All other components within normal limits  CBC - Abnormal; Notable for the following components:   Hemoglobin 11.8 (*)    All other components within normal limits  T4, FREE - Abnormal; Notable for the following components:   Free T4 1.39 (*)    All other components within normal limits  TROPONIN I (HIGH SENSITIVITY) - Abnormal; Notable for the following components:   Troponin I (High Sensitivity) 31 (*)    All other components within normal limits  MAGNESIUM   TSH  TROPONIN I (HIGH SENSITIVITY)  TROPONIN I (HIGH SENSITIVITY)     EKG EKG independently reviewed and interpreted by myself demonstrates:  EKG demonstrates sinus rhythm at a rate of 97, PR 161, QRS 89, QTc 432, no acute ST changes  RADIOLOGY Imaging independently reviewed and interpreted by myself demonstrates:  CT of the chest without evidence of PE, soft tissue density noted over the breast, potentially related to  patient's known malignancy Chest x-Tristy Udovich without acute abnormality  Formal Radiology Read:  CT Angio Chest PE W and/or Wo Contrast Result Date: 06/24/2024 EXAM: LUNG CANCER SCREENING CHEST CT WITHOUT AND WITH CONTRAST 06/24/2024 09:31:03 PM TECHNIQUE: Low-dose CT of the chest was performed without and with the administration of 75 mL iohexol (OMNIPAQUE) 350 MG/ML injection. Multiplanar reformatted images are provided for review. Automated exposure control, iterative reconstruction, and/or weight based adjustment of the mA/kV was utilized to reduce the radiation dose to as low as reasonably achievable. COMPARISON: None available. CLINICAL HISTORY: Pulmonary embolism (PE) suspected, high prob. FINDINGS: MEDIASTINUM: Chest port catheter tip is seen. The left ventricle is visualized. The central airways are clear. LYMPH NODES: No mediastinal, hilar or axillary lymphadenopathy. LUNGS AND PLEURA: No focal  consolidation or pulmonary edema. No pleural effusion or pneumothorax. No suspicious pulmonary nodules. SOFT TISSUES AND BONES: There is a lobulated soft tissue density measuring 2.3 x 2.4 cm in the right breast 3. No acute abnormality of the bones. UPPER ABDOMEN: Limited images of the upper abdomen demonstrate cholecystectomy clips. No other acute abnormality is seen in the limited upper abdomen. IMPRESSION: 1. No evidence of pulmonary embolism. 2. Right breast lobulated soft tissue density measuring 2.3 x 2.4 cm; clinical correlation recommended. Electronically signed by: Greig Pique MD 06/24/2024 09:54 PM EST RP Workstation: [id number]   DG Chest 1 View Result Date: 06/24/2024 EXAM: 1 VIEW(S) XRAY OF THE CHEST 06/24/2024 06:53:26 PM COMPARISON: 06/22/2024 CLINICAL HISTORY: Chest pain FINDINGS: LINES, TUBES AND DEVICES: Stable left chest Port-A-Cath with tip in low right atrium. LUNGS AND PLEURA: Persistent low lung volumes. Azygous lobe. Mild right hemidiaphragm elevation. No focal pulmonary opacity. No pulmonary edema. No pleural effusion. No pneumothorax. HEART AND MEDIASTINUM: Borderline cardiomegaly. No acute abnormality of the mediastinal silhouette. BONES AND SOFT TISSUES: No acute osseous abnormality. IMPRESSION: 1. No acute cardiopulmonary process. 2. Borderline cardiomegaly. 3. Left port-a-cath with tip at low right atrium. Electronically signed by: Rockey Kilts MD 06/24/2024 07:39 PM EST RP Workstation: [id number]   NM Cardiac Muga Rest Result Date: 06/24/2024 EXAM: MUGA SCAN 06/24/2024 10:23:04 AM TECHNIQUE: RADIOPHARMACEUTICAL: The patient's erythrocytes were labeled with 22.19 mCi of 99 mTc. Following injection, scintigraphy with ECG gating was acquired. COMPARISON: None available. CLINICAL HISTORY: Chemotherapy patient, assess LV function. FINDINGS: The cardiac chambers and great vessels appear grossly normal in size and configuration. Right ventricular contractility is subjectively normal.  Left ventricle ejection fraction is 57.4%. IMPRESSION: 1. Left ventricular ejection fraction is 57.4%. Electronically signed by: Norleen Boxer MD 06/24/2024 04:30 PM EST RP Workstation: [id number]    PROCEDURES:  Critical Care performed: No  Procedures   MEDICATIONS ORDERED IN ED: Medications  aspirin  chewable tablet 324 mg (has no administration in time range)  alum & mag hydroxide-simeth (MAALOX/MYLANTA) 200-200-20 MG/5ML suspension 30 mL (30 mLs Oral Given 06/24/24 2043)  iohexol (OMNIPAQUE) 350 MG/ML injection 75 mL (75 mLs Intravenous Contrast Given 06/24/24 2127)     IMPRESSION / MDM / ASSESSMENT AND PLAN / ED COURSE  I reviewed the triage vital signs and the nursing notes.  Differential diagnosis includes, but is not limited to, ACS, arrhythmia, pneumonia, electrolyte abnormality, PE  Patient's presentation is most consistent with acute presentation with potential threat to life or bodily function.  58 year old female presenting to the emergency department for evaluation following episode of chest pain and palpitations.  Stable vitals on presentation here, sinus rhythm.  CBC with mild anemia, BMP without critical derangements.  Initial troponin negative, but repeat obtained  given onset of symptoms shortly prior to presentation and this unfortunately did uptrend to 31.  TSH normal, free T4 minimally elevated.  Patient remains chest pain-free here, but given her increasing troponin, do think she is appropriate for admission for further evaluation.  Differential includes ACS versus arrhythmia causing troponin elevation.  Discussed with patient and she is comfortable with this plan.  Will reach out to hospitalist team.     FINAL CLINICAL IMPRESSION(S) / ED DIAGNOSES   Final diagnoses:  Acute chest pain  Elevated troponin     Rx / DC Orders   ED Discharge Orders     None        Note:  This document was prepared using Dragon voice recognition software and may include  unintentional dictation errors.   Levander Slate, MD 06/25/24 [phone]

## 2024-06-25 ENCOUNTER — Observation Stay: Admit: 2024-06-25

## 2024-06-25 DIAGNOSIS — R079 Chest pain, unspecified: Secondary | ICD-10-CM

## 2024-06-25 DIAGNOSIS — R002 Palpitations: Secondary | ICD-10-CM | POA: Diagnosis present

## 2024-06-25 DIAGNOSIS — R7989 Other specified abnormal findings of blood chemistry: Secondary | ICD-10-CM

## 2024-06-25 DIAGNOSIS — G4733 Obstructive sleep apnea (adult) (pediatric): Secondary | ICD-10-CM | POA: Insufficient documentation

## 2024-06-25 LAB — TROPONIN I (HIGH SENSITIVITY)
Troponin I (High Sensitivity): 19 ng/L — ABNORMAL HIGH (ref <18)
Troponin I (High Sensitivity): 28 ng/L — ABNORMAL HIGH (ref <18)

## 2024-06-25 LAB — HIV ANTIBODY (ROUTINE TESTING W REFLEX): HIV Screen 4th Generation wRfx: NONREACTIVE

## 2024-06-25 MED ORDER — MAGNESIUM HYDROXIDE 400 MG/5ML PO SUSP
30.0000 mL | Freq: Every day | ORAL | Status: DC | PRN
  Administered 2024-06-25: 30 mL via ORAL
  Filled 2024-06-25: qty 30

## 2024-06-25 MED ORDER — ACETAMINOPHEN 325 MG PO TABS: 650.0000 mg | ORAL_TABLET | ORAL | Status: DC | PRN

## 2024-06-25 MED ORDER — IRBESARTAN 150 MG PO TABS
300.0000 mg | ORAL_TABLET | Freq: Every day | ORAL | Status: DC
  Administered 2024-06-25 – 2024-06-27 (×3): 300 mg via ORAL
  Filled 2024-06-25 (×3): qty 2

## 2024-06-25 MED ORDER — ALBUTEROL SULFATE (2.5 MG/3ML) 0.083% IN NEBU: 2.5000 mg | INHALATION_SOLUTION | Freq: Four times a day (QID) | RESPIRATORY_TRACT | Status: DC | PRN

## 2024-06-25 MED ORDER — AMLODIPINE BESYLATE 10 MG PO TABS
10.0000 mg | ORAL_TABLET | Freq: Every day | ORAL | Status: DC
  Administered 2024-06-25 – 2024-06-27 (×3): 10 mg via ORAL
  Filled 2024-06-25 (×2): qty 1

## 2024-06-25 MED ORDER — NITROGLYCERIN 0.4 MG SL SUBL: 0.4000 mg | SUBLINGUAL_TABLET | SUBLINGUAL | Status: DC | PRN

## 2024-06-25 MED ORDER — ATORVASTATIN CALCIUM 10 MG PO TABS
10.0000 mg | ORAL_TABLET | Freq: Every day | ORAL | Status: DC
  Administered 2024-06-25 – 2024-06-27 (×3): 10 mg via ORAL
  Filled 2024-06-25 (×2): qty 1

## 2024-06-25 MED ORDER — ALUM & MAG HYDROXIDE-SIMETH 200-200-20 MG/5ML PO SUSP
30.0000 mL | ORAL | Status: DC | PRN
  Administered 2024-06-26 – 2024-06-27 (×2): 30 mL via ORAL
  Filled 2024-06-25 (×2): qty 30

## 2024-06-25 MED ORDER — HYDROCHLOROTHIAZIDE 25 MG PO TABS
25.0000 mg | ORAL_TABLET | Freq: Every day | ORAL | Status: DC
  Administered 2024-06-25 – 2024-06-27 (×3): 25 mg via ORAL
  Filled 2024-06-25 (×2): qty 1

## 2024-06-25 MED ORDER — VALSARTAN-HYDROCHLOROTHIAZIDE 320-25 MG PO TABS: 1.0000 | ORAL_TABLET | Freq: Every day | ORAL | Status: DC

## 2024-06-25 MED ORDER — ENOXAPARIN SODIUM 60 MG/0.6ML IJ SOSY
0.5000 mg/kg | PREFILLED_SYRINGE | INTRAMUSCULAR | Status: DC
  Administered 2024-06-26 – 2024-06-27 (×2): 42.5 mg via SUBCUTANEOUS
  Filled 2024-06-25 (×3): qty 0.6

## 2024-06-25 MED ORDER — ASPIRIN 81 MG PO TBEC
81.0000 mg | DELAYED_RELEASE_TABLET | Freq: Every day | ORAL | Status: DC
  Administered 2024-06-26 – 2024-06-27 (×2): 81 mg via ORAL
  Filled 2024-06-25 (×2): qty 1

## 2024-06-25 MED ORDER — ONDANSETRON HCL 4 MG/2ML IJ SOLN: 4.0000 mg | Freq: Four times a day (QID) | INTRAMUSCULAR | Status: DC | PRN

## 2024-06-25 MED ORDER — CARVEDILOL 3.125 MG PO TABS
3.1250 mg | ORAL_TABLET | Freq: Two times a day (BID) | ORAL | Status: DC
  Administered 2024-06-25 – 2024-06-27 (×4): 3.125 mg via ORAL
  Filled 2024-06-25 (×4): qty 1

## 2024-06-25 NOTE — H&P (Signed)
 History and Physical    Patient: Caitlin [name] FMW:[id number] DOB: 1965/12/11 DOA: 06/24/2024 DOS: the patient was seen and examined on 06/25/2024 PCP: Donzella Lauraine SAILOR, DO  Patient coming from: Home  Chief Complaint:  Chief Complaint  Patient presents with   Chest Pain    Recently dx with breast cancer, has sternal pain not chest pain. Rates pain as 9/10. Denies any nausea. BGL 132 mg/dl for EMS.     HPI: Caitlin [name] is a 58 y.o. female with medical history significant for Deafness, recently diagnosed breast cancer, moderate persistent asthma, OSA intolerant of CPAP, HTN, depression, being admitted for workup of an episode of palpitations and chest pain that started while she was cleaning her bathroom.  The episode lasted 30 minutes and was resolved by arrival.  No prior episodes of same.  No shortness of breath  In the ED mild tachypnea with heart rate in the 90s, afebrile, SBP 120s to 140s. Troponin 14-31-28.  Normal TSH with slightly elevated free T4 of 1.39 Mild anemia with hemoglobin 11.8 EKG showing sinus at 97, nonspecific ST changes CTA PE protocol negative for PE.  Showing right breast mass  Patient treated with chewable aspirin   Observation requested   ASL interpreter used  Past Medical History:  Diagnosis Date   Asthma    Cancer (HCC)    COPD (chronic obstructive pulmonary disease) (HCC)    Deaf    GERD (gastroesophageal reflux disease)    Hypertension 2011   NO (nasal obstruction)    OSA (obstructive sleep apnea)    Past Surgical History:  Procedure Laterality Date   BREAST BIOPSY Right 06/08/2024   US  RT BREAST BX W LOC DEV 1ST LESION IMG BX SPEC US  GUIDE 06/08/2024 [facility]   CESAREAN SECTION     x 2   CHOLECYSTECTOMY     DENTAL SURGERY Bilateral 04/2024   Removed both fang teeth on the top and wisdom as well.   PORTACATH PLACEMENT Left 06/22/2024   Procedure: INSERTION, TUNNELED CENTRAL VENOUS DEVICE, WITH PORT;  Surgeon: Marinda Jayson KIDD, MD;  Location: [facility];  Service: General;  Laterality: Left;   TUBAL LIGATION     Social History:  reports that she has never smoked. She has never used smokeless tobacco. She reports that she does not currently use alcohol after a past usage of about 1.0 standard drink of alcohol per week. She reports that she does not currently use drugs after having used the following drugs: Crack cocaine.  Allergies  Allergen Reactions   Egg Protein-Containing Drug Products Nausea And Vomiting   Milk-Related Compounds     Family History  Problem Relation Age of Onset   Heart disease Mother    Stroke Mother    Colon cancer Father    Breast cancer Sister        1/2 sister   Brain cancer Brother     Prior to Admission medications   Medication Sig Start Date End Date Taking? Authorizing Provider  albuterol  (VENTOLIN  HFA) 108 (90 Base) MCG/ACT inhaler Inhale 2 puffs into the lungs every 6 (six) hours as needed for wheezing or shortness of breath. 02/22/24  Yes Pardue, Lauraine SAILOR, DO  amLODipine  (NORVASC ) 10 MG tablet TAKE 1 TABLET BY MOUTH ONCE DAILY. TO LOWER BLOOD PRESSURE 02/22/24  Yes Pardue, Lauraine SAILOR, DO  atorvastatin  (LIPITOR) 10 MG tablet Take 1 tablet (10 mg total) by mouth daily. 02/22/24  Yes Pardue, Lauraine SAILOR, DO  budesonide -formoterol  (SYMBICORT ) 80-4.5 MCG/ACT  inhaler Inhale 2 puffs into the lungs in the morning and at bedtime. 02/05/24  Yes Kasa, Kurian, MD  carvedilol  (COREG ) 3.125 MG tablet Take 1 tablet (3.125 mg total) by mouth 2 (two) times daily with a meal. 02/22/24  Yes Pardue, Sarah N, DO  famotidine  (PEPCID ) 20 MG tablet Take 1 tablet (20 mg total) by mouth daily before breakfast. 02/22/24  Yes Pardue, Lauraine SAILOR, DO  levocetirizine (XYZAL ) 5 MG tablet Take 1 tablet (5 mg total) by mouth every evening. 05/31/24  Yes Pardue, Lauraine SAILOR, DO  lidocaine-prilocaine (EMLA) cream Apply to affected area once 06/17/24  Yes Finnegan, Timothy J, MD  valsartan -hydrochlorothiazide  (DIOVAN -HCT) 320-25  MG tablet Take 1 tablet by mouth daily. 02/22/24  Yes Pardue, Lauraine SAILOR, DO  Vitamin D , Ergocalciferol , (DRISDOL ) 1.25 MG (50000 UNIT) CAPS capsule Take 1 capsule (50,000 Units total) by mouth every 7 (seven) days. 09/05/23  Yes Donzella Lauraine SAILOR, DO  Blood Pressure Monitor KIT Use to check blood pressure daily 10/27/19   Caitlin House, MD  Misc. Devices MISC CPAP therapy on 17 cm H2O . Patient to use X-Small size Resmed Full Face Mask AirFit F10. Diagnosis - Obstructive sleep apnea 01/25/20   Caitlin Clam, MD  ondansetron  (ZOFRAN ) 8 MG tablet Take by mouth every 8 (eight) hours as needed for nausea or vomiting.    [provider]  prochlorperazine (COMPAZINE) 10 MG tablet Take 10 mg by mouth every 6 (six) hours as needed for nausea or vomiting.    [provider]    Physical Exam: Vitals:   06/24/24 2030 06/24/24 2100 06/24/24 2250 06/25/24 0000  BP: 131/87 138/75 121/73 (!) 156/97  Pulse: 74 79 97 95  Resp: 15 17 (!) 25 (!) 22  Temp:   98.2 F (36.8 C)   TempSrc:   Oral   SpO2: 100% 100% 97% 100%  Weight:      Height:       Physical Exam Vitals and nursing note reviewed.  Constitutional:      General: She is not in acute distress. HENT:     Head: Normocephalic and atraumatic.  Cardiovascular:     Rate and Rhythm: Normal rate and regular rhythm.     Heart sounds: Normal heart sounds.  Pulmonary:     Effort: Pulmonary effort is normal.     Breath sounds: Normal breath sounds.  Abdominal:     Palpations: Abdomen is soft.     Tenderness: There is no abdominal tenderness.  Neurological:     Mental Status: Mental status is at baseline.     Labs on Admission: I have personally reviewed following labs and imaging studies  CBC: Recent Labs  Lab 06/24/24 1838  WBC 9.5  HGB 11.8*  HCT 36.2  MCV 91.0  PLT 394   Basic Metabolic Panel: Recent Labs  Lab 06/24/24 1838 06/24/24 2057  NA 137  --   K 3.7  --   CL 102  --   CO2 21*  --   GLUCOSE 143*  --   BUN  19  --   CREATININE 0.93  --   CALCIUM  8.9  --   MG  --  2.2   GFR: Estimated Creatinine Clearance: 71 mL/min (by C-G formula based on SCr of 0.93 mg/dL). Liver Function Tests: No results for input(s): AST, ALT, ALKPHOS, BILITOT, PROT, ALBUMIN in the last 168 hours. No results for input(s): LIPASE, AMYLASE in the last 168 hours. No results for input(s): AMMONIA in the  last 168 hours. Coagulation Profile: No results for input(s): INR, PROTIME in the last 168 hours. Cardiac Enzymes: No results for input(s): CKTOTAL, CKMB, CKMBINDEX, TROPONINI in the last 168 hours. BNP (last 3 results) No results for input(s): PROBNP in the last 8760 hours. HbA1C: No results for input(s): HGBA1C in the last 72 hours. CBG: No results for input(s): GLUCAP in the last 168 hours. Lipid Profile: No results for input(s): CHOL, HDL, LDLCALC, TRIG, CHOLHDL, LDLDIRECT in the last 72 hours. Thyroid Function Tests: Recent Labs    06/24/24 2057  TSH 2.341  FREET4 1.39*   Anemia Panel: No results for input(s): VITAMINB12, FOLATE, FERRITIN, TIBC, IRON, RETICCTPCT in the last 72 hours. Urine analysis:    Component Value Date/Time   COLORURINE COLORLESS (A) 06/08/2023 1340   APPEARANCEUR CLEAR (A) 06/08/2023 1340   APPEARANCEUR Hazy 05/28/2012 1937   LABSPEC 1.003 (L) 06/08/2023 1340   LABSPEC 1.012 05/28/2012 1937   PHURINE 7.0 06/08/2023 1340   GLUCOSEU NEGATIVE 06/08/2023 1340   GLUCOSEU Negative 05/28/2012 1937   HGBUR NEGATIVE 06/08/2023 1340   BILIRUBINUR NEGATIVE 06/08/2023 1340   BILIRUBINUR negative 07/04/2020 1217   BILIRUBINUR Negative 05/28/2012 1937   KETONESUR NEGATIVE 06/08/2023 1340   PROTEINUR NEGATIVE 06/08/2023 1340   UROBILINOGEN 0.2 07/04/2020 1217   UROBILINOGEN 0.2 08/19/2013 1508   NITRITE NEGATIVE 06/08/2023 1340   LEUKOCYTESUR TRACE (A) 06/08/2023 1340   LEUKOCYTESUR 1+ 05/28/2012 1937    Radiological Exams on  Admission: CT Angio Chest PE W and/or Wo Contrast Result Date: 06/24/2024 EXAM: LUNG CANCER SCREENING CHEST CT WITHOUT AND WITH CONTRAST 06/24/2024 09:31:03 PM TECHNIQUE: Low-dose CT of the chest was performed without and with the administration of 75 mL iohexol (OMNIPAQUE) 350 MG/ML injection. Multiplanar reformatted images are provided for review. Automated exposure control, iterative reconstruction, and/or weight based adjustment of the mA/kV was utilized to reduce the radiation dose to as low as reasonably achievable. COMPARISON: None available. CLINICAL HISTORY: Pulmonary embolism (PE) suspected, high prob. FINDINGS: MEDIASTINUM: Chest port catheter tip is seen. The left ventricle is visualized. The central airways are clear. LYMPH NODES: No mediastinal, hilar or axillary lymphadenopathy. LUNGS AND PLEURA: No focal consolidation or pulmonary edema. No pleural effusion or pneumothorax. No suspicious pulmonary nodules. SOFT TISSUES AND BONES: There is a lobulated soft tissue density measuring 2.3 x 2.4 cm in the right breast 3. No acute abnormality of the bones. UPPER ABDOMEN: Limited images of the upper abdomen demonstrate cholecystectomy clips. No other acute abnormality is seen in the limited upper abdomen. IMPRESSION: 1. No evidence of pulmonary embolism. 2. Right breast lobulated soft tissue density measuring 2.3 x 2.4 cm; clinical correlation recommended. Electronically signed by: Greig Pique MD 06/24/2024 09:54 PM EST RP Workstation: [id number]   DG Chest 1 View Result Date: 06/24/2024 EXAM: 1 VIEW(S) XRAY OF THE CHEST 06/24/2024 06:53:26 PM COMPARISON: 06/22/2024 CLINICAL HISTORY: Chest pain FINDINGS: LINES, TUBES AND DEVICES: Stable left chest Port-A-Cath with tip in low right atrium. LUNGS AND PLEURA: Persistent low lung volumes. Azygous lobe. Mild right hemidiaphragm elevation. No focal pulmonary opacity. No pulmonary edema. No pleural effusion. No pneumothorax. HEART AND MEDIASTINUM: Borderline  cardiomegaly. No acute abnormality of the mediastinal silhouette. BONES AND SOFT TISSUES: No acute osseous abnormality. IMPRESSION: 1. No acute cardiopulmonary process. 2. Borderline cardiomegaly. 3. Left port-a-cath with tip at low right atrium. Electronically signed by: Rockey Kilts MD 06/24/2024 07:39 PM EST RP Workstation: [id number]   NM Cardiac Muga Rest Result Date: 06/24/2024 EXAM: MUGA SCAN 06/24/2024 10:23:04  AM TECHNIQUE: RADIOPHARMACEUTICAL: The patient's erythrocytes were labeled with 22.19 mCi of 99 mTc. Following injection, scintigraphy with ECG gating was acquired. COMPARISON: None available. CLINICAL HISTORY: Chemotherapy patient, assess LV function. FINDINGS: The cardiac chambers and great vessels appear grossly normal in size and configuration. Right ventricular contractility is subjectively normal. Left ventricle ejection fraction is 57.4%. IMPRESSION: 1. Left ventricular ejection fraction is 57.4%. Electronically signed by: Norleen Boxer MD 06/24/2024 04:30 PM EST RP Workstation: [id number]   Data Reviewed for HPI: Relevant notes from primary care and specialist visits, past discharge summaries as available in EHR, including Care Everywhere. Prior diagnostic testing as pertinent to current admission diagnoses Updated medications and problem lists for reconciliation ED course, including vitals, labs, imaging, treatment and response to treatment Triage notes, nursing and pharmacy notes and ED provider's notes Notable results as noted above in HPI      Assessment and Plan: * Chest pain Palpitations Elevated troponin, non-ACS trend 14-31-28 Mildly elevated T4 of 1.39 with normal TSH CTA chest negative for PE Cardiac monitoring overnight Might need Zio patch Follow echo   OSA (obstructive sleep apnea) Intolerant of CPAP  Invasive ductal carcinoma of right breast (HCC) Following with oncology  Deaf, bilateral Needs ASL interpreter  Moderate persistent asthma without  complication No acute issues Albuterol  as needed  Hypertension Continue home carvedilol , amlodipine , valsartan     DVT prophylaxis: Lovenox   Consults: none  Advance Care Planning:   Code Status: Prior   Family Communication: Family at bedside, ASL interpreter used.  Explained potential diagnoses, plan of care and likely discharge plan for later in the day  Disposition Plan: Back to previous home environment  Severity of Illness: The appropriate patient status for this patient is OBSERVATION. Observation status is judged to be reasonable and necessary in order to provide the required intensity of service to ensure the patient's safety. The patient's presenting symptoms, physical exam findings, and initial radiographic and laboratory data in the context of their medical condition is felt to place them at decreased risk for further clinical deterioration. Furthermore, it is anticipated that the patient will be medically stable for discharge from the hospital within 2 midnights of admission.   Author: Delayne LULLA Solian, MD 06/25/2024 2:04 AM  For on call review [email or web address].

## 2024-06-25 NOTE — Assessment & Plan Note (Addendum)
 Palpitations Elevated troponin, non-ACS trend 14-31-28 Mildly elevated T4 of 1.39 with normal TSH CTA chest negative for PE Cardiac monitoring overnight Might need Zio patch Follow echo

## 2024-06-25 NOTE — Progress Notes (Signed)
 Progress Note   Patient: Caitlin [name] FMW:[id number] DOB: 12/03/1965 DOA: 06/24/2024     0 DOS: the patient was seen and examined on 06/25/2024   Brief hospital course: Dian Laprade is a 58 y.o. female with medical history significant for Deafness, recently diagnosed breast cancer, moderate persistent asthma, OSA intolerant of CPAP, HTN, depression, being admitted for workup of an episode of palpitations and chest pain that started while she was cleaning her bathroom.  The episode lasted 30 minutes and was resolved by arrival. In the ED mild tachypnea with heart rate in the 90s, afebrile, SBP 120s to 140s. Troponin 14-31-28.  Normal TSH with slightly elevated free T4 of 1.39 Mild anemia with hemoglobin 11.8 EKG showing sinus at 97, nonspecific ST changes CTA PE protocol negative for PE.  Showing right breast mass  Other hospital course as noted below:  Assessment and Plan: Chest pain Palpitations Minimally elevated troponin, non-ACS trend 14-31-28 Mildly elevated T4 of 1.39 with normal TSH CTA chest negative for PE Cardiac monitoring overnight Might need Zio patch Follow-up on echocardiogram     OSA (obstructive sleep apnea) Intolerant of CPAP   Invasive ductal carcinoma of right breast [facility]) Following with oncology   Deaf, bilateral Needs ASL interpreter   Moderate persistent asthma without complication No acute issues Albuterol  as needed   Hypertension Continue home carvedilol , amlodipine , valsartan     DVT prophylaxis: Lovenox    Consults: none   Advance Care Planning:   Code Status: Prior    Family Communication: Family at bedside, ASL interpreter used.  Explained potential diagnoses, plan of care and likely discharge plan for later in the day   Disposition Plan: Back to previous home environment    Subjective:  Patient admits to improvement in chest pain Palpitation resolved Echocardiogram pending Denies nausea vomiting abdominal  pain  Physical Exam: Vitals and nursing note reviewed.  Constitutional:      General: She is not in acute distress. HENT:     Head: Normocephalic and atraumatic.  Cardiovascular:     Rate and Rhythm: Normal rate and regular rhythm.     Heart sounds: Normal heart sounds.  Pulmonary:     Effort: Pulmonary effort is normal.     Breath sounds: Normal breath sounds.  Abdominal:     Palpations: Abdomen is soft.     Tenderness: There is no abdominal tenderness.  Neurological:     Mental Status: Mental status is at baseline.  Vitals:   06/25/24 0500 06/25/24 0700 06/25/24 0735 06/25/24 0835  BP: 119/69 111/73 137/77 130/75  Pulse: 67 67 69 81  Resp: 15 16 12 17   Temp: 98.5 F (36.9 C)     TempSrc: Oral     SpO2: 100% 97% 100% 100%  Weight:      Height:        Data Reviewed: CT angio did not show PE    Latest Ref Rng & Units 06/24/2024    6:38 PM 05/31/2024    2:15 PM 08/31/2023   10:24 AM  CBC  WBC 4.0 - 10.5 K/uL 9.5  5.4  5.1   Hemoglobin 12.0 - 15.0 g/dL 88.1  88.0  88.3   Hematocrit 36.0 - 46.0 % 36.2  36.5  35.5   Platelets 150 - 400 K/uL 394  440  419        Latest Ref Rng & Units 06/24/2024    6:38 PM 05/31/2024    2:15 PM 02/22/2024    1:54 PM  BMP  Glucose 70 - 99 mg/dL 856  91  885   BUN 6 - 20 mg/dL 19  13  14    Creatinine 0.44 - 1.00 mg/dL 9.06  9.10  9.03   BUN/Creat Ratio 9 - 23  15  15    Sodium 135 - 145 mmol/L 137  141  139   Potassium 3.5 - 5.1 mmol/L 3.7  3.8  3.9   Chloride 98 - 111 mmol/L 102  100  100   CO2 22 - 32 mmol/L 21  25  24    Calcium  8.9 - 10.3 mg/dL 8.9  9.7  9.6        Author: Drue ONEIDA Potter, MD 06/25/2024 10:08 AM  For on call review [email or web address].

## 2024-06-25 NOTE — Plan of Care (Signed)
   Problem: Education: Goal: Knowledge of General Education information will improve Description Including pain rating scale, medication(s)/side effects and non-pharmacologic comfort measures Outcome: Progressing

## 2024-06-25 NOTE — Assessment & Plan Note (Signed)
 No acute issues Albuterol  as needed

## 2024-06-25 NOTE — Progress Notes (Signed)
 Anticoagulation monitoring(Lovenox ):  58 yo  female ordered Lovenox  40 mg Q24h    Filed Weights   06/24/24 1835  Weight: 86.5 kg (190 lb 11.2 oz)   BMI 32.7    Lab Results  Component Value Date   CREATININE 0.93 06/24/2024   CREATININE 0.89 05/31/2024   CREATININE 0.96 02/22/2024   Estimated Creatinine Clearance: 71 mL/min (by C-G formula based on SCr of 0.93 mg/dL). Hemoglobin & Hematocrit     Component Value Date/Time   HGB 11.8 (L) 06/24/2024 1838   HGB 11.9 05/31/2024 1415   HCT 36.2 06/24/2024 1838   HCT 36.5 05/31/2024 1415     Per Protocol for Patient with estCrcl > 30 ml/min and BMI > 30, will transition to Lovenox  42.5 mg Q24h.

## 2024-06-25 NOTE — Assessment & Plan Note (Signed)
 Following with oncology

## 2024-06-25 NOTE — ED Notes (Signed)
 NURSE PAM INFORMED OF ASSIGNED BED

## 2024-06-25 NOTE — Assessment & Plan Note (Signed)
Needs ASL interpreter

## 2024-06-25 NOTE — Assessment & Plan Note (Signed)
 Intolerant of CPAP. ?

## 2024-06-25 NOTE — Assessment & Plan Note (Signed)
 Continue home carvedilol , amlodipine , valsartan 

## 2024-06-26 ENCOUNTER — Observation Stay: Admit: 2024-06-26

## 2024-06-26 DIAGNOSIS — R079 Chest pain, unspecified: Secondary | ICD-10-CM | POA: Diagnosis not present

## 2024-06-26 LAB — BASIC METABOLIC PANEL WITH GFR
Anion gap: 10 (ref 5–15)
BUN: 18 mg/dL (ref 6–20)
CO2: 27 mmol/L (ref 22–32)
Calcium: 8.8 mg/dL — ABNORMAL LOW (ref 8.9–10.3)
Chloride: 101 mmol/L (ref 98–111)
Creatinine, Ser: 0.79 mg/dL (ref 0.44–1.00)
GFR, Estimated: >60 mL/min (ref >60)
Glucose, Bld: 105 mg/dL — ABNORMAL HIGH (ref 70–99)
Potassium: 3.6 mmol/L (ref 3.5–5.1)
Sodium: 138 mmol/L (ref 135–145)

## 2024-06-26 LAB — CBC WITH DIFFERENTIAL/PLATELET
Abs Immature Granulocytes: 0.02 K/uL (ref 0.00–0.07)
Basophils Absolute: 0 K/uL (ref 0.0–0.1)
Basophils Relative: 0 %
Eosinophils Absolute: 0.1 K/uL (ref 0.0–0.5)
Eosinophils Relative: 2 %
HCT: 34.8 % — ABNORMAL LOW (ref 36.0–46.0)
Hemoglobin: 11.4 g/dL — ABNORMAL LOW (ref 12.0–15.0)
Immature Granulocytes: 0 %
Lymphocytes Relative: 38 %
Lymphs Abs: 2.4 K/uL (ref 0.7–4.0)
MCH: 29.2 pg (ref 26.0–34.0)
MCHC: 32.8 g/dL (ref 30.0–36.0)
MCV: 89 fL (ref 80.0–100.0)
Monocytes Absolute: 0.6 K/uL (ref 0.1–1.0)
Monocytes Relative: 9 %
Neutro Abs: 3.4 K/uL (ref 1.7–7.7)
Neutrophils Relative %: 51 %
Platelets: 331 K/uL (ref 150–400)
RBC: 3.91 MIL/uL (ref 3.87–5.11)
RDW: 12.3 % (ref 11.5–15.5)
WBC: 6.5 K/uL (ref 4.0–10.5)
nRBC: 0 % (ref 0.0–0.2)

## 2024-06-26 NOTE — Plan of Care (Signed)
  Problem: Activity: Goal: Ability to tolerate increased activity will improve Outcome: Progressing   Problem: Nutrition: Goal: Adequate nutrition will be maintained Outcome: Progressing   Problem: Coping: Goal: Level of anxiety will decrease Outcome: Progressing   Problem: Safety: Goal: Ability to remain free from injury will improve Outcome: Progressing   Problem: Skin Integrity: Goal: Risk for impaired skin integrity will decrease Outcome: Progressing

## 2024-06-26 NOTE — Progress Notes (Signed)
 Progress Note   Patient: Maryse Brierley [place] FMW:[id number] DOB: [birth year] DOA: 06/24/2024     0 DOS: the patient was seen and examined on 06/26/2024     Brief hospital course: Tachina Spoonemore is a 58 y.o. female with medical history significant for Deafness, recently diagnosed breast cancer, moderate persistent asthma, OSA intolerant of CPAP, HTN, depression, being admitted for workup of an episode of palpitations and chest pain that started while she was cleaning her bathroom.  The episode lasted 30 minutes and was resolved by arrival. In the ED mild tachypnea with heart rate in the 90s, afebrile, SBP 120s to 140s. Troponin 14-31-28.  Normal TSH with slightly elevated free T4 of 1.39 Mild anemia with hemoglobin 11.8 EKG showing sinus at 97, nonspecific ST changes CTA PE protocol negative for PE.  Showing right breast mass   Other hospital course as noted below:   Assessment and Plan: Chest pain Palpitations Minimally elevated troponin, non-ACS trend 14-31-28 Chest pain currently resolved Mildly elevated T4 of 1.39 with normal TSH CTA chest negative for PE Cardiac monitoring overnight Echocardiogram still yet to be done, if echo is concerning we will consult cardiologist if not then outpatient follow up is reasonable   OSA (obstructive sleep apnea) Intolerant of CPAP   Invasive ductal carcinoma of right breast [facility]) Following with oncology   Deaf, bilateral Needs ASL interpreter   Moderate persistent asthma without complication No acute issues Albuterol  as needed   Hypertension Continue home carvedilol , amlodipine , valsartan      DVT prophylaxis: Lovenox    Consults: none   Advance Care Planning:   Code Status: Prior    Family Communication: Family at bedside, ASL interpreter used.  Disposition Plan: Back to previous home environment     Subjective:  She admits to improvement in chest pain Echocardiogram still pending Denies nausea vomiting abdominal pain or  chest pain   Physical Exam: Vitals and nursing note reviewed.  Constitutional:      General: She is not in acute distress. HENT:     Head: Normocephalic and atraumatic.  Cardiovascular:     Rate and Rhythm: Normal rate and regular rhythm.     Heart sounds: Normal heart sounds.  Pulmonary:     Effort: Pulmonary effort is normal.     Breath sounds: Normal breath sounds.  Abdominal:     Palpations: Abdomen is soft.     Tenderness: There is no abdominal tenderness.  Neurological:     Mental Status: Mental status is at baseline.    Data Reviewed: CT angio did not show PE   Vitals:   06/26/24 0700 06/26/24 0800 06/26/24 0832 06/26/24 1140  BP:   118/67 108/62  Pulse:   68 84  Resp: 16 13    Temp:   98.1 F (36.7 C) 98.1 F (36.7 C)  TempSrc:   Oral Oral  SpO2:   98% 97%  Weight:      Height:          Latest Ref Rng & Units 06/26/2024    5:35 AM 06/24/2024    6:38 PM 05/31/2024    2:15 PM  CBC  WBC 4.0 - 10.5 K/uL 6.5  9.5  5.4   Hemoglobin 12.0 - 15.0 g/dL 88.5  88.1  88.0   Hematocrit 36.0 - 46.0 % 34.8  36.2  36.5   Platelets 150 - 400 K/uL 331  394  440        Latest Ref Rng & Units 06/26/2024  5:35 AM 06/24/2024    6:38 PM 05/31/2024    2:15 PM  BMP  Glucose 70 - 99 mg/dL 894  856  91   BUN 6 - 20 mg/dL 18  19  13    Creatinine 0.44 - 1.00 mg/dL 9.20  9.06  9.10   BUN/Creat Ratio 9 - 23   15   Sodium 135 - 145 mmol/L 138  137  141   Potassium 3.5 - 5.1 mmol/L 3.6  3.7  3.8   Chloride 98 - 111 mmol/L 101  102  100   CO2 22 - 32 mmol/L 27  21  25    Calcium  8.9 - 10.3 mg/dL 8.8  8.9  9.7      Author: Drue ONEIDA Potter, MD 06/26/2024 2:13 PM  For on call review [email or web address].

## 2024-06-27 ENCOUNTER — Encounter: Payer: Self-pay | Admitting: Oncology

## 2024-06-27 ENCOUNTER — Observation Stay: Admit: 2024-06-27 | Discharge: 2024-06-27 | Disposition: A | Attending: Internal Medicine

## 2024-06-27 DIAGNOSIS — R079 Chest pain, unspecified: Secondary | ICD-10-CM

## 2024-06-27 DIAGNOSIS — R7989 Other specified abnormal findings of blood chemistry: Secondary | ICD-10-CM

## 2024-06-27 LAB — CBC WITH DIFFERENTIAL/PLATELET
Abs Immature Granulocytes: 0.02 K/uL (ref 0.00–0.07)
Basophils Absolute: 0 K/uL (ref 0.0–0.1)
Basophils Relative: 0 %
Eosinophils Absolute: 0.3 K/uL (ref 0.0–0.5)
Eosinophils Relative: 3 %
HCT: 34.3 % — ABNORMAL LOW (ref 36.0–46.0)
Hemoglobin: 11.5 g/dL — ABNORMAL LOW (ref 12.0–15.0)
Immature Granulocytes: 0 %
Lymphocytes Relative: 35 %
Lymphs Abs: 2.8 K/uL (ref 0.7–4.0)
MCH: 29.5 pg (ref 26.0–34.0)
MCHC: 33.5 g/dL (ref 30.0–36.0)
MCV: 87.9 fL (ref 80.0–100.0)
Monocytes Absolute: 0.6 K/uL (ref 0.1–1.0)
Monocytes Relative: 8 %
Neutro Abs: 4.3 K/uL (ref 1.7–7.7)
Neutrophils Relative %: 54 %
Platelets: 364 K/uL (ref 150–400)
RBC: 3.9 MIL/uL (ref 3.87–5.11)
RDW: 12.3 % (ref 11.5–15.5)
WBC: 7.9 K/uL (ref 4.0–10.5)
nRBC: 0 % (ref 0.0–0.2)

## 2024-06-27 LAB — ECHOCARDIOGRAM COMPLETE
Height: 64 in
S' Lateral: 2.13 cm
Weight: 3051.17 [oz_av]

## 2024-06-27 LAB — BASIC METABOLIC PANEL WITH GFR
Anion gap: 10 (ref 5–15)
BUN: 17 mg/dL (ref 6–20)
CO2: 29 mmol/L (ref 22–32)
Calcium: 8.9 mg/dL (ref 8.9–10.3)
Chloride: 100 mmol/L (ref 98–111)
Creatinine, Ser: 0.92 mg/dL (ref 0.44–1.00)
GFR, Estimated: >60 mL/min (ref >60)
Glucose, Bld: 115 mg/dL — ABNORMAL HIGH (ref 70–99)
Potassium: 3.6 mmol/L (ref 3.5–5.1)
Sodium: 139 mmol/L (ref 135–145)

## 2024-06-27 LAB — LIPOPROTEIN A (LPA): Lipoprotein (a): 158.9 nmol/L — ABNORMAL HIGH (ref <75.0)

## 2024-06-27 MED ORDER — OMEPRAZOLE MAGNESIUM 20 MG PO TBEC: 20.0000 mg | DELAYED_RELEASE_TABLET | Freq: Every day | ORAL | 1 refills | Status: DC

## 2024-06-27 MED FILL — Fosaprepitant Dimeglumine For IV Infusion 150 MG (Base Eq): INTRAVENOUS | Qty: 5 | Status: AC

## 2024-06-27 NOTE — Discharge Instructions (Signed)
 Follow up with PCP and cardiology. Come back to ER for life threatening issues or concerns.

## 2024-06-27 NOTE — Progress Notes (Signed)
*  PRELIMINARY RESULTS* Echocardiogram 2D Echocardiogram has been performed.  Caitlin [name] 06/27/2024, 8:14 AM

## 2024-06-27 NOTE — Discharge Summary (Signed)
 Caitlin [name] FMW:[id number] DOB: July 13, 1966 DOA: 06/24/2024  PCP: Donzella Lauraine SAILOR, DO  Admit date: 06/24/2024 Discharge date: 06/27/2024  Time spent: 35 minutes  Recommendations for Outpatient Follow-up:  Pcp f/u Oncology f/u Cardilogy f/u     Discharge Diagnoses:  Principal Problem:   Chest pain Active Problems:   Elevated troponin   Palpitations   Hypertension   Obesity (BMI 30.0-34.9)   Moderate persistent asthma without complication   Deaf, bilateral   Invasive ductal carcinoma of right breast (HCC)   OSA (obstructive sleep apnea)   Discharge Condition: improved  Diet recommendation: heart healthy  Filed Weights   06/24/24 1835  Weight: 86.5 kg    History of present illness:  From admission  Caitlin [name] is a 58 y.o. female with medical history significant for Deafness, recently diagnosed breast cancer, moderate persistent asthma, OSA intolerant of CPAP, HTN, depression, being admitted for workup of an episode of palpitations and chest pain that started while she was cleaning her bathroom.  The episode lasted 30 minutes and was resolved by arrival.  No prior episodes of same.  No shortness of breath   Hospital Course:   Patient presents with an episode of transient chest pain. Found to have mild troponin elevation that peaked at 31. CTA negative for PE or other acute pathology, TTE performed showing no abnormalities, no wall motion abnormality. Likely supply demand mismatch. No events on tele, no significant lab abnormalities. Patient does suffer from GERD and wonders whether this could be a symptom of poor control of gerd - this is possible and so we will trial stopping patient's h2 blocker and instead trial a PPI. Set to start chemotherapy tomorrow, think patient is stable to initiate that. However we did also advise she f/u outpatient with her cardiologist, dr. Budd. ASL interpreter utilized.   Procedures: none   Consultations: none  Discharge  Exam: Vitals:   06/27/24 0734 06/27/24 1114  BP: 122/72 99/66  Pulse: 78 78  Resp: 20   Temp: 98.4 F (36.9 C) 98.6 F (37 C)  SpO2:  97%    General: NAD Cardiovascular: RRR Respiratory: CTAB  Discharge Instructions   Discharge Instructions     Diet - low sodium heart healthy   Complete by: As directed    Increase activity slowly   Complete by: As directed    No wound care   Complete by: As directed       Allergies as of 06/27/2024       Reactions   Egg Protein-containing Drug Products Nausea And Vomiting   Milk-related Compounds         Medication List     STOP taking these medications    prochlorperazine 10 MG tablet Commonly known as: COMPAZINE       TAKE these medications    albuterol  108 (90 Base) MCG/ACT inhaler Commonly known as: VENTOLIN  HFA Inhale 2 puffs into the lungs every 6 (six) hours as needed for wheezing or shortness of breath.   amLODipine  10 MG tablet Commonly known as: NORVASC  TAKE 1 TABLET BY MOUTH ONCE DAILY. TO LOWER BLOOD PRESSURE   atorvastatin  10 MG tablet Commonly known as: LIPITOR Take 1 tablet (10 mg total) by mouth daily.   Blood Pressure Monitor Kit Use to check blood pressure daily   budesonide -formoterol  80-4.5 MCG/ACT inhaler Commonly known as: Symbicort  Inhale 2 puffs into the lungs in the morning and at bedtime.   carvedilol  3.125 MG tablet Commonly known as: COREG  Take 1  tablet (3.125 mg total) by mouth 2 (two) times daily with a meal.   famotidine  20 MG tablet Commonly known as: PEPCID  Take 1 tablet (20 mg total) by mouth daily before breakfast.   levocetirizine 5 MG tablet Commonly known as: XYZAL  Take 1 tablet (5 mg total) by mouth every evening.   lidocaine-prilocaine cream Commonly known as: EMLA Apply to affected area once   Misc. Devices Misc CPAP therapy on 17 cm H2O . Patient to use X-Small size Resmed Full Face Mask AirFit F10. Diagnosis - Obstructive sleep apnea   omeprazole  20 MG  tablet Commonly known as: PriLOSEC  OTC Take 1 tablet (20 mg total) by mouth daily.   ondansetron  8 MG tablet Commonly known as: ZOFRAN  Take by mouth every 8 (eight) hours as needed for nausea or vomiting.   valsartan -hydrochlorothiazide  320-25 MG tablet Commonly known as: DIOVAN -HCT Take 1 tablet by mouth daily.   Vitamin D  (Ergocalciferol ) 1.25 MG (50000 UNIT) Caps capsule Commonly known as: DRISDOL  Take 1 capsule (50,000 Units total) by mouth every 7 (seven) days.       Allergies  Allergen Reactions   Egg Protein-Containing Drug Products Nausea And Vomiting   Milk-Related Compounds     Follow-up Information     Donzella Lauraine SAILOR, DO Follow up.   Specialty: Family Medicine Contact information: [address] [place] [address] [phone]         Darliss Rogue, MD Follow up.   Specialties: Cardiology, Radiology Contact information: [address] [place] [address] [phone]                  The results of significant diagnostics from this hospitalization (including imaging, microbiology, ancillary and laboratory) are listed below for reference.    Significant Diagnostic Studies: ECHOCARDIOGRAM COMPLETE Result Date: 06/27/2024    ECHOCARDIOGRAM REPORT   Patient Name:   Caitlin [name] Date of Exam: 06/27/2024 Medical Rec #:  [id number]          Height:       64.0 in Accession #:    [id number]         Weight:       190.7 lb Date of Birth:  [birth year]         BSA:          1.917 m Patient Age:    57 years           BP:           129/73 mmHg Patient Gender: F                  HR:           73 bpm. Exam Location:  [facility] Procedure: 2D Echo, Cardiac Doppler and Color Doppler (Both Spectral and Color            Flow Doppler were utilized during procedure). Indications:     Elevated troponin  History:         Patient has prior history of Echocardiogram examinations, most                  recent 11/11/2023. COPD; Risk  Factors:Hypertension and Sleep                  Apnea.  Sonographer:     Christopher Furnace Referring Phys:  [id number] DELAYNE LULLA SOLIAN Diagnosing Phys: Deatrice Cage MD  Sonographer Comments: Technically difficult study due to poor echo windows, no apical window and no  subcostal window. Image acquisition challenging due to COPD. IMPRESSIONS  1. Left ventricular ejection fraction, by estimation, is 60 to 65%. The left ventricle has normal function. The left ventricle has no regional wall motion abnormalities. There is mild left ventricular hypertrophy. Left ventricular diastolic function could not be evaluated.  2. Right ventricular systolic function is normal. The right ventricular size is normal. Tricuspid regurgitation signal is inadequate for assessing PA pressure.  3. Left atrial size was mildly dilated.  4. The mitral valve is normal in structure. No evidence of mitral valve regurgitation. No evidence of mitral stenosis.  5. The aortic valve is normal in structure. Aortic valve regurgitation is not visualized. No aortic stenosis is present. FINDINGS  Left Ventricle: Left ventricular ejection fraction, by estimation, is 60 to 65%. The left ventricle has normal function. The left ventricle has no regional wall motion abnormalities. The left ventricular internal cavity size was normal in size. There is  mild left ventricular hypertrophy. Left ventricular diastolic function could not be evaluated. Right Ventricle: The right ventricular size is normal. No increase in right ventricular wall thickness. Right ventricular systolic function is normal. Tricuspid regurgitation signal is inadequate for assessing PA pressure. Left Atrium: Left atrial size was mildly dilated. Right Atrium: Right atrial size was normal in size. Pericardium: There is no evidence of pericardial effusion. Mitral Valve: The mitral valve is normal in structure. No evidence of mitral valve regurgitation. No evidence of mitral valve stenosis. Tricuspid Valve:  The tricuspid valve is normal in structure. Tricuspid valve regurgitation is not demonstrated. No evidence of tricuspid stenosis. Aortic Valve: The aortic valve is normal in structure. Aortic valve regurgitation is not visualized. No aortic stenosis is present. Pulmonic Valve: The pulmonic valve was normal in structure. Pulmonic valve regurgitation is not visualized. No evidence of pulmonic stenosis. Aorta: The aortic root is normal in size and structure. Venous: The inferior vena cava was not well visualized. IAS/Shunts: No atrial level shunt detected by color flow Doppler.  LEFT VENTRICLE PLAX 2D LVIDd:         3.55 cm LVIDs:         2.13 cm LV PW:         1.10 cm LV IVS:        1.11 cm LVOT diam:     1.30 cm LVOT Area:     1.33 cm  LEFT ATRIUM         Index LA diam:    4.10 cm 2.14 cm/m   AORTA Ao Root diam: 2.50 cm  SHUNTS Systemic Diam: 1.30 cm Deatrice Cage MD Electronically signed by Deatrice Cage MD Signature Date/Time: 06/27/2024/11:24:36 AM    Final    CT Angio Chest PE W and/or Wo Contrast Result Date: 06/24/2024 EXAM: LUNG CANCER SCREENING CHEST CT WITHOUT AND WITH CONTRAST 06/24/2024 09:31:03 PM TECHNIQUE: Low-dose CT of the chest was performed without and with the administration of 75 mL iohexol (OMNIPAQUE) 350 MG/ML injection. Multiplanar reformatted images are provided for review. Automated exposure control, iterative reconstruction, and/or weight based adjustment of the mA/kV was utilized to reduce the radiation dose to as low as reasonably achievable. COMPARISON: None available. CLINICAL HISTORY: Pulmonary embolism (PE) suspected, high prob. FINDINGS: MEDIASTINUM: Chest port catheter tip is seen. The left ventricle is visualized. The central airways are clear. LYMPH NODES: No mediastinal, hilar or axillary lymphadenopathy. LUNGS AND PLEURA: No focal consolidation or pulmonary edema. No pleural effusion or pneumothorax. No suspicious pulmonary nodules. SOFT TISSUES AND BONES: There is  a  lobulated soft tissue density measuring 2.3 x 2.4 cm in the right breast 3. No acute abnormality of the bones. UPPER ABDOMEN: Limited images of the upper abdomen demonstrate cholecystectomy clips. No other acute abnormality is seen in the limited upper abdomen. IMPRESSION: 1. No evidence of pulmonary embolism. 2. Right breast lobulated soft tissue density measuring 2.3 x 2.4 cm; clinical correlation recommended. Electronically signed by: Greig Pique MD 06/24/2024 09:54 PM EST RP Workstation: [id number]   DG Chest 1 View Result Date: 06/24/2024 EXAM: 1 VIEW(S) XRAY OF THE CHEST 06/24/2024 06:53:26 PM COMPARISON: 06/22/2024 CLINICAL HISTORY: Chest pain FINDINGS: LINES, TUBES AND DEVICES: Stable left chest Port-A-Cath with tip in low right atrium. LUNGS AND PLEURA: Persistent low lung volumes. Azygous lobe. Mild right hemidiaphragm elevation. No focal pulmonary opacity. No pulmonary edema. No pleural effusion. No pneumothorax. HEART AND MEDIASTINUM: Borderline cardiomegaly. No acute abnormality of the mediastinal silhouette. BONES AND SOFT TISSUES: No acute osseous abnormality. IMPRESSION: 1. No acute cardiopulmonary process. 2. Borderline cardiomegaly. 3. Left port-a-cath with tip at low right atrium. Electronically signed by: Rockey Kilts MD 06/24/2024 07:39 PM EST RP Workstation: [id number]   NM Cardiac Muga Rest Result Date: 06/24/2024 EXAM: MUGA SCAN 06/24/2024 10:23:04 AM TECHNIQUE: RADIOPHARMACEUTICAL: The patient's erythrocytes were labeled with 22.19 mCi of 99 mTc. Following injection, scintigraphy with ECG gating was acquired. COMPARISON: None available. CLINICAL HISTORY: Chemotherapy patient, assess LV function. FINDINGS: The cardiac chambers and great vessels appear grossly normal in size and configuration. Right ventricular contractility is subjectively normal. Left ventricle ejection fraction is 57.4%. IMPRESSION: 1. Left ventricular ejection fraction is 57.4%. Electronically signed by: Norleen Boxer  MD 06/24/2024 04:30 PM EST RP Workstation: [id number]   DG Chest 1 View Result Date: 06/22/2024 EXAM: 1 VIEW(S) XRAY OF THE CHEST 06/22/2024 10:06:00 AM COMPARISON: 10/29/2023 CLINICAL HISTORY: Pneumothorax FINDINGS: LINES, TUBES AND DEVICES: Left chest power port in place with tip terminating over the right atrium. LUNGS AND PLEURA: Low lung volumes. Right upper lobe azygos lobe, normal variant. No focal pulmonary opacity. No pulmonary edema. No pleural effusion. No pneumothorax. HEART AND MEDIASTINUM: No acute abnormality of the cardiac and mediastinal silhouettes. BONES AND SOFT TISSUES: No acute osseous abnormality. IMPRESSION: 1. No acute cardiopulmonary process. 2. Low lung volumes,  left chest power port in place. Electronically signed by: Helayne Hurst MD 06/22/2024 10:15 AM EST RP Workstation: [id number]   DG C-Arm 1-60 Min-No Report Result Date: 06/22/2024 Fluoroscopy was utilized by the requesting physician.  No radiographic interpretation.   US  AXILLARY NODE CORE BIOPSY RIGHT Addendum Date: 06/09/2024 ADDENDUM REPORT: 06/09/2024 12:34 ADDENDUM: PATHOLOGY revealed: Site 1. Breast, right, needle core biopsy, breast mass. (heart clip) 10 o'clock, 15 cmfn - INVASIVE DUCTAL CARCINOMA - OVERALL GRADE: 3 - LYMPHOVASCULAR INVASION: NOT IDENTIFIED CANCER LENGTH: 16 MM / 1.6 CM - CALCIFICATIONS: NOT IDENTIFIED Pathology results are CONCORDANT with imaging findings, per Dr. Alm Parkins. PATHOLOGY revealed: Site 2. Lymph node, needle/core biopsy, right axillary lymph node (hydromark butterfly clip)- ONE LYMPH NODE, NEGATIVE FOR METASTATIC CARCINOMA (0/1). Pathology results are CONCORDANT with imaging findings, per Dr. Alm Parkins. Pathology results and recommendations below were discussed with patient via daughter Watson) by telephone on 06/09/2024 by Rock Hover RN. Patient reported biopsy site within normal limits with slight tenderness at the site. Post biopsy care instructions were reviewed, questions  were answered and my direct phone number was provided to patient. Patient was instructed to call [facility] if any concerns or questions arise related to the biopsy. RECOMMENDATIONS:  1. Surgical and oncological consultation. Request for surgical and oncological consultation relayed to Shasta Ada RN at [facility] by Rock Hover RN on 06/09/2024. Pathology results reported by Rock Hover RN on 06/09/2024. Electronically Signed   By: Alm Parkins M.D.   On: 06/09/2024 12:34   Result Date: 06/09/2024 CLINICAL DATA:  Patient presents for ultrasound-guided core needle biopsy of a right breast mass and a right axillary lymph node. EXAM: ULTRASOUND GUIDED RIGHT BREAST CORE NEEDLE BIOPSY ULTRASOUND GUIDED RIGHT AXILLARY LYMPH NODE CORE NEEDLE BIOPSY COMPARISON:  Previous exam(s). PROCEDURE: I met with the patient and we discussed the procedure of ultrasound-guided biopsy, including benefits and alternatives. We discussed the high likelihood of a successful procedure. We discussed the risks of the procedure, including infection, bleeding, tissue injury, clip migration, and inadequate sampling. Informed written consent was given. The usual time-out protocol was performed immediately prior to the procedure. Biopsy #1: 2.5 cm mass at 10 o'clock, 15 cm the nipple. Lesion quadrant: Upper outer quadrant Using sterile technique and 1% Lidocaine as local anesthetic, under direct ultrasound visualization, a 12 gauge spring-loaded device was used to perform biopsy of the mass at 10 o'clock using an inferior approach. At the conclusion of the procedure a heart shaped tissue marker clip was deployed into the biopsy cavity. Biopsy #2: Right axillary lymph node with the mildly thickened cortex. Lesion location: Right axilla Using sterile technique and 1% Lidocaine as local anesthetic, under direct ultrasound visualization, a 14 gauge spring-loaded device was used to perform biopsy of the right axillary  lymph node with the mildly thickened cortex using an inferior approach. At the conclusion of the procedure a HydroMARK, butterfly shaped tissue marker clip was deployed into the biopsy cavity. Follow up 2 view mammogram was performed and dictated separately. IMPRESSION: Ultrasound guided biopsy of a right breast mass and a right axillary lymph node. No apparent complications. Electronically Signed: By: Alm Parkins M.D. On: 06/08/2024 08:50   US  RT BREAST BX W LOC DEV 1ST LESION IMG BX SPEC US  GUIDE Addendum Date: 06/09/2024 ADDENDUM REPORT: 06/09/2024 12:34 ADDENDUM: PATHOLOGY revealed: Site 1. Breast, right, needle core biopsy, breast mass. (heart clip) 10 o'clock, 15 cmfn - INVASIVE DUCTAL CARCINOMA - OVERALL GRADE: 3 - LYMPHOVASCULAR INVASION: NOT IDENTIFIED CANCER LENGTH: 16 MM / 1.6 CM - CALCIFICATIONS: NOT IDENTIFIED Pathology results are CONCORDANT with imaging findings, per Dr. Alm Parkins. PATHOLOGY revealed: Site 2. Lymph node, needle/core biopsy, right axillary lymph node (hydromark butterfly clip)- ONE LYMPH NODE, NEGATIVE FOR METASTATIC CARCINOMA (0/1). Pathology results are CONCORDANT with imaging findings, per Dr. Alm Parkins. Pathology results and recommendations below were discussed with patient via daughter Watson) by telephone on 06/09/2024 by Rock Hover RN. Patient reported biopsy site within normal limits with slight tenderness at the site. Post biopsy care instructions were reviewed, questions were answered and my direct phone number was provided to patient. Patient was instructed to call [facility] if any concerns or questions arise related to the biopsy. RECOMMENDATIONS: 1. Surgical and oncological consultation. Request for surgical and oncological consultation relayed to Shasta Ada RN at [facility] by Rock Hover RN on 06/09/2024. Pathology results reported by Rock Hover RN on 06/09/2024. Electronically Signed   By: Alm Parkins M.D.   On: 06/09/2024  12:34   Result Date: 06/09/2024 CLINICAL DATA:  Patient presents for ultrasound-guided core needle biopsy of a right breast mass and a right axillary lymph node. EXAM: ULTRASOUND GUIDED RIGHT BREAST CORE NEEDLE  BIOPSY ULTRASOUND GUIDED RIGHT AXILLARY LYMPH NODE CORE NEEDLE BIOPSY COMPARISON:  Previous exam(s). PROCEDURE: I met with the patient and we discussed the procedure of ultrasound-guided biopsy, including benefits and alternatives. We discussed the high likelihood of a successful procedure. We discussed the risks of the procedure, including infection, bleeding, tissue injury, clip migration, and inadequate sampling. Informed written consent was given. The usual time-out protocol was performed immediately prior to the procedure. Biopsy #1: 2.5 cm mass at 10 o'clock, 15 cm the nipple. Lesion quadrant: Upper outer quadrant Using sterile technique and 1% Lidocaine as local anesthetic, under direct ultrasound visualization, a 12 gauge spring-loaded device was used to perform biopsy of the mass at 10 o'clock using an inferior approach. At the conclusion of the procedure a heart shaped tissue marker clip was deployed into the biopsy cavity. Biopsy #2: Right axillary lymph node with the mildly thickened cortex. Lesion location: Right axilla Using sterile technique and 1% Lidocaine as local anesthetic, under direct ultrasound visualization, a 14 gauge spring-loaded device was used to perform biopsy of the right axillary lymph node with the mildly thickened cortex using an inferior approach. At the conclusion of the procedure a HydroMARK, butterfly shaped tissue marker clip was deployed into the biopsy cavity. Follow up 2 view mammogram was performed and dictated separately. IMPRESSION: Ultrasound guided biopsy of a right breast mass and a right axillary lymph node. No apparent complications. Electronically Signed: By: Alm Parkins M.D. On: 06/08/2024 08:50   MM CLIP PLACEMENT RIGHT Result Date:  06/08/2024 CLINICAL DATA:  Assess post biopsy marker clip placement following ultrasound-guided core needle biopsy of a right breast mass and right axillary lymph node. EXAM: 3D DIAGNOSTIC RIGHT MAMMOGRAM POST ULTRASOUND BIOPSY COMPARISON:  Previous exam(s). ACR Breast Density Category b: There are scattered areas of fibroglandular density. FINDINGS: 3D Mammographic images were obtained following ultrasound guided biopsy of a right breast mass and a right axillary lymph node. The heart shaped biopsy marking clip is within the mass in the upper outer right breast. The right axillary lymph node HydroMARK, butterfly shaped marker clip could not be visualized mammographically. IMPRESSION: Appropriate positioning of the heart shaped biopsy marking clip at the site of biopsy in the mass in the upper outer right breast. Axillary lymph node biopsy marker clip could not be visualized mammographically. Final Assessment: Post Procedure Mammograms for Marker Placement Electronically Signed   By: Alm Parkins M.D.   On: 06/08/2024 09:00   MM 3D DIAGNOSTIC MAMMOGRAM BILATERAL BREAST Result Date: 06/03/2024 CLINICAL DATA:  Palpable area in the RIGHT breast. EXAM: DIGITAL DIAGNOSTIC BILATERAL MAMMOGRAM WITH TOMOSYNTHESIS AND CAD; ULTRASOUND RIGHT BREAST LIMITED TECHNIQUE: Bilateral digital diagnostic mammography and breast tomosynthesis was performed. The images were evaluated with computer-aided detection. ; Targeted ultrasound examination of the right breast was performed COMPARISON:  Previous exam(s). ACR Breast Density Category b: There are scattered areas of fibroglandular density. FINDINGS: Spot compression tomosynthesis views were obtained of the site of palpable concern in the RIGHT breast. There is an irregular oval mass subjacent to the site of palpable concern. An additional questioned asymmetry in the RIGHT outer breast resolves with additional views, consistent with overlapping tissue. No suspicious mass,  distortion, or microcalcifications are identified to suggest presence of malignancy in the LEFT breast. On physical exam, there is firmness of the RIGHT upper outer breast. Targeted ultrasound was performed the RIGHT upper outer breast. At 10 o'clock 15 cm from the nipple, there is an oval hypoechoic mass with irregular margins. It measures  25 x 25 x 21 mm. This corresponds to the mass noted mammographically. Targeted ultrasound was performed of the RIGHT upper outer breast. No suspicious cystic or solid mass is seen at the site of asymmetry concern. Targeted ultrasound was performed of the RIGHT axilla. There is a mildly enlarged RIGHT axillary lymph node which demonstrates focal cortical thickening of approximately 5 mm. Additional RIGHT axillary lymph nodes demonstrate cortical thickness of approximately 3 mm. IMPRESSION: 1. There is a 25 mm mass at the site of palpable concern which is concerning for malignancy. Recommend ultrasound-guided biopsy for definitive characterization. 2. There is a mildly enlarged RIGHT axillary lymph node with focal cortical thickening of 5 mm. Recommend ultrasound-guided biopsy for definitive characterization. 3. No mammographic evidence of malignancy in the LEFT breast. RECOMMENDATION: 1. RIGHT breast ultrasound-guided biopsy x1 2. RIGHT axillary ultrasound-guided biopsy x1 I have discussed the findings and recommendations with the patient and patient's daughters with the assistance of a sign language interpreter. The biopsy procedure was discussed with the patient and questions were answered. Patient expressed their understanding of the biopsy recommendation. Patient will be scheduled for biopsy at her earliest convenience by the schedulers. Ordering provider will be notified. If applicable, a reminder letter will be sent to the patient regarding the next appointment. BI-RADS CATEGORY  5: Highly suggestive of malignancy. Electronically Signed   By: Corean Salter M.D.   On:  06/03/2024 16:13   US  LIMITED ULTRASOUND INCLUDING AXILLA RIGHT BREAST Result Date: 06/03/2024 CLINICAL DATA:  Palpable area in the RIGHT breast. EXAM: DIGITAL DIAGNOSTIC BILATERAL MAMMOGRAM WITH TOMOSYNTHESIS AND CAD; ULTRASOUND RIGHT BREAST LIMITED TECHNIQUE: Bilateral digital diagnostic mammography and breast tomosynthesis was performed. The images were evaluated with computer-aided detection. ; Targeted ultrasound examination of the right breast was performed COMPARISON:  Previous exam(s). ACR Breast Density Category b: There are scattered areas of fibroglandular density. FINDINGS: Spot compression tomosynthesis views were obtained of the site of palpable concern in the RIGHT breast. There is an irregular oval mass subjacent to the site of palpable concern. An additional questioned asymmetry in the RIGHT outer breast resolves with additional views, consistent with overlapping tissue. No suspicious mass, distortion, or microcalcifications are identified to suggest presence of malignancy in the LEFT breast. On physical exam, there is firmness of the RIGHT upper outer breast. Targeted ultrasound was performed the RIGHT upper outer breast. At 10 o'clock 15 cm from the nipple, there is an oval hypoechoic mass with irregular margins. It measures 25 x 25 x 21 mm. This corresponds to the mass noted mammographically. Targeted ultrasound was performed of the RIGHT upper outer breast. No suspicious cystic or solid mass is seen at the site of asymmetry concern. Targeted ultrasound was performed of the RIGHT axilla. There is a mildly enlarged RIGHT axillary lymph node which demonstrates focal cortical thickening of approximately 5 mm. Additional RIGHT axillary lymph nodes demonstrate cortical thickness of approximately 3 mm. IMPRESSION: 1. There is a 25 mm mass at the site of palpable concern which is concerning for malignancy. Recommend ultrasound-guided biopsy for definitive characterization. 2. There is a mildly  enlarged RIGHT axillary lymph node with focal cortical thickening of 5 mm. Recommend ultrasound-guided biopsy for definitive characterization. 3. No mammographic evidence of malignancy in the LEFT breast. RECOMMENDATION: 1. RIGHT breast ultrasound-guided biopsy x1 2. RIGHT axillary ultrasound-guided biopsy x1 I have discussed the findings and recommendations with the patient and patient's daughters with the assistance of a sign language interpreter. The biopsy procedure was discussed with the patient  and questions were answered. Patient expressed their understanding of the biopsy recommendation. Patient will be scheduled for biopsy at her earliest convenience by the schedulers. Ordering provider will be notified. If applicable, a reminder letter will be sent to the patient regarding the next appointment. BI-RADS CATEGORY  5: Highly suggestive of malignancy. Electronically Signed   By: Corean Salter M.D.   On: 06/03/2024 16:13   US  PELVIC COMPLETE WITH TRANSVAGINAL Result Date: 05/31/2024 EXAM: US  Pelvis, Complete Transvaginal and Transabdominal without Doppler TECHNIQUE: Transabdominal and transvaginal pelvic duplex ultrasound using B-mode/gray scaled imaging without Doppler spectral analysis and color flow was obtained. COMPARISON: CT abdomen and pelvis 06/10/2016 CLINICAL HISTORY: postmenopausal bleeding x6 months. FINDINGS: UTERUS: Uterus measures 5.7 x 5.1 x 6.4 cm with a volume of 96 ml. 10.6 cm fibroid in the anterior uterus and 2.0 cm fibroid in the left uterine fundus. ENDOMETRIAL STRIPE: Heterogeneous poorly defined endometrium measuring 13 mm. There is an endometrial polyp in the lower uterine segment measuring 2.1 cm. RIGHT OVARY: The ovaries were not visualized. LEFT OVARY: The ovaries were not visualized. FREE FLUID: No free fluid in the pelvis. IMPRESSION: 1. Heterogeneous, poorly defined endometrium measuring 13 mm with a 2.1 cm endometrial polyp in the lower uterine segment. In the setting  of post-menopausal bleeding, endometrial sampling is indicated to exclude carcinoma. If results are benign, sonohysterogram should be considered for focal lesion work-up. (Ref: Radiological Reasoning: Algorithmic Workup of Abnormal Vaginal Bleeding with Endovaginal Sonography and Sonohysterography. AJR 2008; 808:D31-26) 2. Fibroid uterus. Electronically signed by: Norman Gatlin MD 05/31/2024 04:29 PM EDT RP Workstation: [id number]    Microbiology: No results found for this or any previous visit (from the past 240 hours).   Labs: Basic Metabolic Panel: Recent Labs  Lab 06/24/24 1838 06/24/24 2057 06/26/24 0535 06/27/24 0444  NA 137  --  138 139  K 3.7  --  3.6 3.6  CL 102  --  101 100  CO2 21*  --  27 29  GLUCOSE 143*  --  105* 115*  BUN 19  --  18 17  CREATININE 0.93  --  0.79 0.92  CALCIUM  8.9  --  8.8* 8.9  MG  --  2.2  --   --    Liver Function Tests: No results for input(s): AST, ALT, ALKPHOS, BILITOT, PROT, ALBUMIN in the last 168 hours. No results for input(s): LIPASE, AMYLASE in the last 168 hours. No results for input(s): AMMONIA in the last 168 hours. CBC: Recent Labs  Lab 06/24/24 1838 06/26/24 0535 06/27/24 0444  WBC 9.5 6.5 7.9  NEUTROABS  --  3.4 4.3  HGB 11.8* 11.4* 11.5*  HCT 36.2 34.8* 34.3*  MCV 91.0 89.0 87.9  PLT 394 331 364   Cardiac Enzymes: No results for input(s): CKTOTAL, CKMB, CKMBINDEX, TROPONINI in the last 168 hours. BNP: BNP (last 3 results) No results for input(s): BNP in the last 8760 hours.  ProBNP (last 3 results) No results for input(s): PROBNP in the last 8760 hours.  CBG: No results for input(s): GLUCAP in the last 168 hours.     Signed:  Devaughn KATHEE Ban MD.  [facility] 06/27/2024, 1:13 PM

## 2024-06-27 NOTE — Plan of Care (Signed)

## 2024-06-27 NOTE — TOC CM/SW Note (Signed)
 Transition of Care [facility]) - Inpatient Brief Assessment   Patient Details  Name: Caitlin [name] MRN: [id number] Date of Birth: Jan 12, 1966  Transition of Care [facility]) CM/SW Contact:    Lauraine JAYSON Carpen, LCSW Phone Number: 06/27/2024, 2:38 PM   Clinical Narrative: Patient has orders to discharge home today. Chart reviewed. No TOC needs identified. CSW signing off.  Transition of Care Asessment: Insurance and Status: Insurance coverage has been reviewed Patient has primary care physician: Yes Home environment has been reviewed: Single family home Prior level of function:: Not documented Prior/Current Home Services: No current home services Social Drivers of Health Review: SDOH reviewed no interventions necessary Readmission risk has been reviewed: Yes Transition of care needs: no transition of care needs at this time

## 2024-06-28 ENCOUNTER — Encounter: Payer: Self-pay | Admitting: Oncology

## 2024-06-28 ENCOUNTER — Inpatient Hospital Stay

## 2024-06-28 ENCOUNTER — Telehealth: Payer: Self-pay

## 2024-06-28 ENCOUNTER — Encounter: Payer: Self-pay | Admitting: *Deleted

## 2024-06-28 ENCOUNTER — Inpatient Hospital Stay (HOSPITAL_BASED_OUTPATIENT_CLINIC_OR_DEPARTMENT_OTHER): Admitting: Oncology

## 2024-06-28 VITALS — BP 120/67 | HR 83 | Temp 98.9°F | Resp 18 | Ht 64.0 in | Wt 187.0 lb

## 2024-06-28 VITALS — BP 124/60

## 2024-06-28 DIAGNOSIS — K219 Gastro-esophageal reflux disease without esophagitis: Secondary | ICD-10-CM | POA: Diagnosis not present

## 2024-06-28 DIAGNOSIS — D72829 Elevated white blood cell count, unspecified: Secondary | ICD-10-CM | POA: Diagnosis not present

## 2024-06-28 DIAGNOSIS — E876 Hypokalemia: Secondary | ICD-10-CM | POA: Diagnosis not present

## 2024-06-28 DIAGNOSIS — J449 Chronic obstructive pulmonary disease, unspecified: Secondary | ICD-10-CM | POA: Diagnosis not present

## 2024-06-28 DIAGNOSIS — Z5189 Encounter for other specified aftercare: Secondary | ICD-10-CM | POA: Diagnosis not present

## 2024-06-28 DIAGNOSIS — Z1732 Human epidermal growth factor receptor 2 negative status: Secondary | ICD-10-CM | POA: Diagnosis not present

## 2024-06-28 DIAGNOSIS — R079 Chest pain, unspecified: Secondary | ICD-10-CM

## 2024-06-28 DIAGNOSIS — Z5111 Encounter for antineoplastic chemotherapy: Secondary | ICD-10-CM | POA: Diagnosis present

## 2024-06-28 DIAGNOSIS — Z808 Family history of malignant neoplasm of other organs or systems: Secondary | ICD-10-CM | POA: Diagnosis not present

## 2024-06-28 DIAGNOSIS — Z800 Family history of malignant neoplasm of digestive organs: Secondary | ICD-10-CM | POA: Diagnosis not present

## 2024-06-28 DIAGNOSIS — Z170 Estrogen receptor positive status [ER+]: Secondary | ICD-10-CM | POA: Diagnosis not present

## 2024-06-28 DIAGNOSIS — C50911 Malignant neoplasm of unspecified site of right female breast: Secondary | ICD-10-CM

## 2024-06-28 DIAGNOSIS — C50411 Malignant neoplasm of upper-outer quadrant of right female breast: Secondary | ICD-10-CM | POA: Diagnosis not present

## 2024-06-28 DIAGNOSIS — G4730 Sleep apnea, unspecified: Secondary | ICD-10-CM | POA: Diagnosis not present

## 2024-06-28 DIAGNOSIS — Z7951 Long term (current) use of inhaled steroids: Secondary | ICD-10-CM | POA: Diagnosis not present

## 2024-06-28 DIAGNOSIS — Z1722 Progesterone receptor negative status: Secondary | ICD-10-CM | POA: Diagnosis not present

## 2024-06-28 DIAGNOSIS — J4489 Other specified chronic obstructive pulmonary disease: Secondary | ICD-10-CM | POA: Diagnosis not present

## 2024-06-28 DIAGNOSIS — I10 Essential (primary) hypertension: Secondary | ICD-10-CM | POA: Diagnosis not present

## 2024-06-28 DIAGNOSIS — Z803 Family history of malignant neoplasm of breast: Secondary | ICD-10-CM | POA: Diagnosis not present

## 2024-06-28 DIAGNOSIS — Z79899 Other long term (current) drug therapy: Secondary | ICD-10-CM | POA: Diagnosis not present

## 2024-06-28 LAB — CBC WITH DIFFERENTIAL (CANCER CENTER ONLY)
Abs Immature Granulocytes: 0.04 K/uL (ref 0.00–0.07)
Basophils Absolute: 0 K/uL (ref 0.0–0.1)
Basophils Relative: 0 %
Eosinophils Absolute: 0.3 K/uL (ref 0.0–0.5)
Eosinophils Relative: 3 %
HCT: 35.1 % — ABNORMAL LOW (ref 36.0–46.0)
Hemoglobin: 11.5 g/dL — ABNORMAL LOW (ref 12.0–15.0)
Immature Granulocytes: 1 %
Lymphocytes Relative: 19 %
Lymphs Abs: 1.6 K/uL (ref 0.7–4.0)
MCH: 28.8 pg (ref 26.0–34.0)
MCHC: 32.8 g/dL (ref 30.0–36.0)
MCV: 88 fL (ref 80.0–100.0)
Monocytes Absolute: 0.5 K/uL (ref 0.1–1.0)
Monocytes Relative: 6 %
Neutro Abs: 6 K/uL (ref 1.7–7.7)
Neutrophils Relative %: 71 %
Platelet Count: 387 K/uL (ref 150–400)
RBC: 3.99 MIL/uL (ref 3.87–5.11)
RDW: 12.1 % (ref 11.5–15.5)
WBC Count: 8.4 K/uL (ref 4.0–10.5)
nRBC: 0 % (ref 0.0–0.2)

## 2024-06-28 LAB — CMP (CANCER CENTER ONLY)
ALT: 25 U/L (ref 0–44)
AST: 20 U/L (ref 15–41)
Albumin: 3.9 g/dL (ref 3.5–5.0)
Alkaline Phosphatase: 85 U/L (ref 38–126)
Anion gap: 10 (ref 5–15)
BUN: 17 mg/dL (ref 6–20)
CO2: 26 mmol/L (ref 22–32)
Calcium: 9 mg/dL (ref 8.9–10.3)
Chloride: 100 mmol/L (ref 98–111)
Creatinine: 0.82 mg/dL (ref 0.44–1.00)
GFR, Estimated: >60 mL/min (ref >60)
Glucose, Bld: 178 mg/dL — ABNORMAL HIGH (ref 70–99)
Potassium: 3.3 mmol/L — ABNORMAL LOW (ref 3.5–5.1)
Sodium: 136 mmol/L (ref 135–145)
Total Bilirubin: 0.7 mg/dL (ref 0.0–1.2)
Total Protein: 7.8 g/dL (ref 6.5–8.1)

## 2024-06-28 MED ORDER — ONDANSETRON HCL 8 MG PO TABS: 8.0000 mg | ORAL_TABLET | Freq: Three times a day (TID) | ORAL | 3 refills | Status: AC | PRN

## 2024-06-28 MED ORDER — PROCHLORPERAZINE MALEATE 10 MG PO TABS: 10.0000 mg | ORAL_TABLET | Freq: Four times a day (QID) | ORAL | 3 refills | Status: AC | PRN

## 2024-06-28 MED ORDER — DOXORUBICIN HCL CHEMO IV INJECTION 2 MG/ML
60.0000 mg/m2 | Freq: Once | INTRAVENOUS | Status: AC
  Administered 2024-06-28: 118 mg via INTRAVENOUS
  Filled 2024-06-28: qty 59

## 2024-06-28 MED ORDER — SODIUM CHLORIDE 0.9 % IV SOLN
600.0000 mg/m2 | Freq: Once | INTRAVENOUS | Status: AC
  Administered 2024-06-28: 1180 mg via INTRAVENOUS
  Filled 2024-06-28: qty 59

## 2024-06-28 MED ORDER — PALONOSETRON HCL INJECTION 0.25 MG/5ML
0.2500 mg | Freq: Once | INTRAVENOUS | Status: AC
  Administered 2024-06-28: 0.25 mg via INTRAVENOUS
  Filled 2024-06-28: qty 5

## 2024-06-28 MED ORDER — DEXAMETHASONE SOD PHOSPHATE PF 10 MG/ML IJ SOLN
10.0000 mg | Freq: Once | INTRAMUSCULAR | Status: AC
  Administered 2024-06-28: 10 mg via INTRAVENOUS

## 2024-06-28 MED ORDER — SODIUM CHLORIDE 0.9 % IV SOLN
150.0000 mg | Freq: Once | INTRAVENOUS | Status: AC
  Administered 2024-06-28: 150 mg via INTRAVENOUS
  Filled 2024-06-28: qty 150

## 2024-06-28 MED ORDER — SODIUM CHLORIDE 0.9 % IV SOLN
INTRAVENOUS | Status: DC
  Filled 2024-06-28: qty 250

## 2024-06-28 NOTE — Patient Instructions (Signed)
 [facility] - A DEPT OF [facility]  Discharge Instructions: Thank you for choosing [facility] to provide your oncology and hematology care.  If you have a lab appointment with the Cancer Center, please go directly to the Cancer Center and check in at the registration area.  Wear comfortable clothing and clothing appropriate for easy access to any Portacath or PICC line.   We strive to give you quality time with your provider. You may need to reschedule your appointment if you arrive late (15 or more minutes).  Arriving late affects you and other patients whose appointments are after yours.  Also, if you miss three or more appointments without notifying the office, you may be dismissed from the clinic at the provider's discretion.      For prescription refill requests, have your pharmacy contact our office and allow 72 hours for refills to be completed.    Today you received the following chemotherapy and/or immunotherapy agents ADRIAMYACIN and CYTOXAN      To help prevent nausea and vomiting after your treatment, we encourage you to take your nausea medication as directed.  BELOW ARE SYMPTOMS THAT SHOULD BE REPORTED IMMEDIATELY: *FEVER GREATER THAN 100.4 F (38 C) OR HIGHER *CHILLS OR SWEATING *NAUSEA AND VOMITING THAT IS NOT CONTROLLED WITH YOUR NAUSEA MEDICATION *UNUSUAL SHORTNESS OF BREATH *UNUSUAL BRUISING OR BLEEDING *URINARY PROBLEMS (pain or burning when urinating, or frequent urination) *BOWEL PROBLEMS (unusual diarrhea, constipation, pain near the anus) TENDERNESS IN MOUTH AND THROAT WITH OR WITHOUT PRESENCE OF ULCERS (sore throat, sores in mouth, or a toothache) UNUSUAL RASH, SWELLING OR PAIN  UNUSUAL VAGINAL DISCHARGE OR ITCHING   Items with * indicate a potential emergency and should be followed up as soon as possible or go to the Emergency Department if any problems should occur.  Please show the CHEMOTHERAPY ALERT CARD or  IMMUNOTHERAPY ALERT CARD at check-in to the Emergency Department and triage nurse.  Should you have questions after your visit or need to cancel or reschedule your appointment, please contact [facility] - A DEPT OF [name]  [phone] and follow the prompts.  Office hours are 8:00 a.m. to 4:30 p.m. Monday - Friday. Please note that voicemails left after 4:00 p.m. may not be returned until the following business day.  We are closed weekends and major holidays. You have access to a nurse at all times for urgent questions. Please call the main number to the clinic [phone] and follow the prompts.  For any non-urgent questions, you may also contact your provider using MyChart. We now offer e-Visits for anyone 43 and older to request care online for non-urgent symptoms. For details visit [email or web address].   Also download the MyChart app! Go to the app store, search MyChart, open the app, select [facility], and log in with your MyChart username and password.  Doxorubicin Injection What is this medication? DOXORUBICIN (dox oh ROO bi sin) treats some types of cancer. It works by slowing down the growth of cancer cells. This medicine may be used for other purposes; ask your health care provider or pharmacist if you have questions. COMMON BRAND NAME(S): Adriamycin, Adriamycin PFS, Adriamycin RDF, Rubex What should I tell my care team before I take this medication? They need to know if you have any of these conditions: Heart disease History of low blood cell levels caused by a medication Liver disease Recent or ongoing radiation  An unusual or allergic reaction to doxorubicin, other medications, foods, dyes, or preservatives If you or your partner are pregnant or trying to get pregnant Breast-feeding How should I use this medication? This medication is injected into a vein. It is given by your care team in a hospital or clinic setting. Talk to your  care team about the use of this medication in children. Special care may be needed. Overdosage: If you think you have taken too much of this medicine contact a poison control center or emergency room at once. NOTE: This medicine is only for you. Do not share this medicine with others. What if I miss a dose? Keep appointments for follow-up doses. It is important not to miss your dose. Call your care team if you are unable to keep an appointment. What may interact with this medication? 6-mercaptopurine Paclitaxel Phenytoin St. John's wort Trastuzumab Verapamil This list may not describe all possible interactions. Give your health care provider a list of all the medicines, herbs, non-prescription drugs, or dietary supplements you use. Also tell them if you smoke, drink alcohol, or use illegal drugs. Some items may interact with your medicine. What should I watch for while using this medication? Your condition will be monitored carefully while you are receiving this medication. You may need blood work while taking this medication. This medication may make you feel generally unwell. This is not uncommon as chemotherapy can affect healthy cells as well as cancer cells. Report any side effects. Continue your course of treatment even though you feel ill unless your care team tells you to stop. There is a maximum amount of this medication you should receive throughout your life. The amount depends on the medical condition being treated and your overall health. Your care team will watch how much of this medication you receive. Tell your care team if you have taken this medication before. Your urine may turn red for a few days after your dose. This is not blood. If your urine is dark or brown, call your care team. In some cases, you may be given additional medications to help with side effects. Follow all directions for their use. This medication may increase your risk of getting an infection. Call your care  team for advice if you get a fever, chills, sore throat, or other symptoms of a cold or flu. Do not treat yourself. Try to avoid being around people who are sick. This medication may increase your risk to bruise or bleed. Call your care team if you notice any unusual bleeding. Talk to your care team about your risk of cancer. You may be more at risk for certain types of cancers if you take this medication. Talk to your care team if you or your partner may be pregnant. Serious birth defects can occur if you take this medication during pregnancy and for 6 months after the last dose. Contraception is recommended while taking this medication and for 6 months after the last dose. Your care team can help you find the option that works for you. If your partner can get pregnant, use a condom while taking this medication and for 6 months after the last dose. Do not breastfeed while taking this medication. This medication may cause infertility. Talk to your care team if you are concerned about your fertility. What side effects may I notice from receiving this medication? Side effects that you should report to your care team as soon as possible: Allergic reactions--skin rash, itching, hives, swelling of the  face, lips, tongue, or throat Heart failure--shortness of breath, swelling of the ankles, feet, or hands, sudden weight gain, unusual weakness or fatigue Heart rhythm changes--fast or irregular heartbeat, dizziness, feeling faint or lightheaded, chest pain, trouble breathing Infection--fever, chills, cough, sore throat, wounds that don't heal, pain or trouble when passing urine, general feeling of discomfort or being unwell Low red blood cell level--unusual weakness or fatigue, dizziness, headache, trouble breathing Painful swelling, warmth, or redness of the skin, blisters or sores at the infusion site Unusual bruising or bleeding Side effects that usually do not require medical attention (report to your  care team if they continue or are bothersome): Diarrhea Hair loss Nausea Pain, redness, or swelling with sores inside the mouth or throat Red urine This list may not describe all possible side effects. Call your doctor for medical advice about side effects. You may report side effects to FDA at 1-800-FDA-1088. Where should I keep my medication? This medication is given in a hospital or clinic. It will not be stored at home. NOTE: This sheet is a summary. It may not cover all possible information. If you have questions about this medicine, talk to your doctor, pharmacist, or health care provider.  2024 Elsevier/Gold Standard (2022-11-06 00:00:00)   Cyclophosphamide Injection What is this medication? CYCLOPHOSPHAMIDE (sye kloe FOSS fa mide) treats some types of cancer. It works by slowing down the growth of cancer cells. This medicine may be used for other purposes; ask your health care provider or pharmacist if you have questions. COMMON BRAND NAME(S): Cyclophosphamide, Cytoxan, Neosar What should I tell my care team before I take this medication? They need to know if you have any of these conditions: Heart disease Irregular heartbeat or rhythm Infection Kidney problems Liver disease Low blood cell levels (white cells, platelets, or red blood cells) Lung disease Previous radiation Trouble passing urine An unusual or allergic reaction to cyclophosphamide, other medications, foods, dyes, or preservatives Pregnant or trying to get pregnant Breast-feeding How should I use this medication? This medication is injected into a vein. It is given by your care team in a hospital or clinic setting. Talk to your care team about the use of this medication in children. Special care may be needed. Overdosage: If you think you have taken too much of this medicine contact a poison control center or emergency room at once. NOTE: This medicine is only for you. Do not share this medicine with  others. What if I miss a dose? Keep appointments for follow-up doses. It is important not to miss your dose. Call your care team if you are unable to keep an appointment. What may interact with this medication? Amphotericin B Amiodarone Azathioprine Certain antivirals for HIV or hepatitis Certain medications for blood pressure, such as enalapril, lisinopril , quinapril Cyclosporine Diuretics Etanercept Indomethacin Medications that relax muscles Metronidazole  Natalizumab Tamoxifen Warfarin This list may not describe all possible interactions. Give your health care provider a list of all the medicines, herbs, non-prescription drugs, or dietary supplements you use. Also tell them if you smoke, drink alcohol, or use illegal drugs. Some items may interact with your medicine. What should I watch for while using this medication? This medication may make you feel generally unwell. This is not uncommon as chemotherapy can affect healthy cells as well as cancer cells. Report any side effects. Continue your course of treatment even though you feel ill unless your care team tells you to stop. You may need blood work while you are taking this medication.  This medication may increase your risk of getting an infection. Call your care team for advice if you get a fever, chills, sore throat, or other symptoms of a cold or flu. Do not treat yourself. Try to avoid being around people who are sick. Avoid taking medications that contain aspirin , acetaminophen , ibuprofen, naproxen , or ketoprofen unless instructed by your care team. These medications may hide a fever. Be careful brushing or flossing your teeth or using a toothpick because you may get an infection or bleed more easily. If you have any dental work done, tell your dentist you are receiving this medication. Drink water or other fluids as directed. Urinate often, even at night. Some products may contain alcohol. Ask your care team if this medication  contains alcohol. Be sure to tell all care teams you are taking this medicine. Certain medicines, like metronidazole  and disulfiram, can cause an unpleasant reaction when taken with alcohol. The reaction includes flushing, headache, nausea, vomiting, sweating, and increased thirst. The reaction can last from 30 minutes to several hours. Talk to your care team if you wish to become pregnant or think you might be pregnant. This medication can cause serious birth defects if taken during pregnancy and for 1 year after the last dose. A negative pregnancy test is required before starting this medication. A reliable form of contraception is recommended while taking this medication and for 1 year after the last dose. Talk to your care team about reliable forms of contraception. Do not father a child while taking this medication and for 4 months after the last dose. Use a condom during this time period. Do not breast-feed while taking this medication or for 1 week after the last dose. This medication may cause infertility. Talk to your care team if you are concerned about your fertility. Talk to your care team about your risk of cancer. You may be more at risk for certain types of cancer if you take this medication. What side effects may I notice from receiving this medication? Side effects that you should report to your care team as soon as possible: Allergic reactions--skin rash, itching, hives, swelling of the face, lips, tongue, or throat Dry cough, shortness of breath or trouble breathing Heart failure--shortness of breath, swelling of the ankles, feet, or hands, sudden weight gain, unusual weakness or fatigue Heart muscle inflammation--unusual weakness or fatigue, shortness of breath, chest pain, fast or irregular heartbeat, dizziness, swelling of the ankles, feet, or hands Heart rhythm changes--fast or irregular heartbeat, dizziness, feeling faint or lightheaded, chest pain, trouble  breathing Infection--fever, chills, cough, sore throat, wounds that don't heal, pain or trouble when passing urine, general feeling of discomfort or being unwell Kidney injury--decrease in the amount of urine, swelling of the ankles, hands, or feet Liver injury--right upper belly pain, loss of appetite, nausea, light-colored stool, dark yellow or brown urine, yellowing skin or eyes, unusual weakness or fatigue Low red blood cell level--unusual weakness or fatigue, dizziness, headache, trouble breathing Low sodium level--muscle weakness, fatigue, dizziness, headache, confusion Red or dark brown urine Unusual bruising or bleeding Side effects that usually do not require medical attention (report to your care team if they continue or are bothersome): Hair loss Irregular menstrual cycles or spotting Loss of appetite Nausea Pain, redness, or swelling with sores inside the mouth or throat Vomiting This list may not describe all possible side effects. Call your doctor for medical advice about side effects. You may report side effects to FDA at 1-800-FDA-1088. Where should I  keep my medication? This medication is given in a hospital or clinic. It will not be stored at home. NOTE: This sheet is a summary. It may not cover all possible information. If you have questions about this medicine, talk to your doctor, pharmacist, or health care provider.  2024 Elsevier/Gold Standard (2021-12-20 00:00:00)

## 2024-06-28 NOTE — Progress Notes (Unsigned)
 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  INTERVAL HISTORY: Patient returns to clinic today for further evaluation and consideration of cycle 1 of neoadjuvant Adriamycin and Cytoxan.  She was recently in the hospital with chest pain, palpitations, and elevated troponin which was thought to be demand ischemia.  She currently feels well and is back to her baseline. She has no neurologic complaints.  She denies any recent fevers or illnesses.  She has a good appetite and denies weight loss.  She has no chest pain, shortness of breath, cough, or hemoptysis.  She denies any nausea, vomiting, constipation, or diarrhea.  She has no urinary complaints.  Patient offers no specific complaints today.  REVIEW OF SYSTEMS:   Review of Systems  Constitutional: Negative.  Negative for fever, malaise/fatigue and weight loss.  Respiratory: Negative.  Negative for cough, hemoptysis and shortness of breath.   Cardiovascular: Negative.  Negative for chest pain and leg swelling.  Gastrointestinal: Negative.  Negative for abdominal pain.  Genitourinary: Negative.  Negative for dysuria.  Musculoskeletal: Negative.  Negative for back pain.  Skin: Negative.  Negative for rash.  Neurological: Negative.  Negative for dizziness, focal weakness and weakness.  Psychiatric/Behavioral: Negative.  The patient is not nervous/anxious.     As per HPI. Otherwise, a complete review of systems is negative.  PAST MEDICAL HISTORY: Past Medical History:  Diagnosis Date   Asthma    Cancer (HCC)    COPD (chronic obstructive pulmonary  disease) (HCC)    Deaf    GERD (gastroesophageal reflux disease)    Hypertension 2011   NO (nasal obstruction)    OSA (obstructive sleep apnea)     PAST SURGICAL HISTORY: Past Surgical History:  Procedure Laterality Date   BREAST BIOPSY Right 06/08/2024   US  RT BREAST BX W LOC DEV 1ST LESION IMG BX SPEC US  GUIDE 06/08/2024 [facility]   CESAREAN SECTION     x 2   CHOLECYSTECTOMY     DENTAL SURGERY Bilateral 04/2024   Removed both fang teeth on the top and wisdom as well.   PORTACATH PLACEMENT Left 06/22/2024   Procedure: INSERTION, TUNNELED CENTRAL VENOUS DEVICE, WITH PORT;  Surgeon: Marinda Jayson KIDD, MD;  Location: [facility] ORS;  Service: General;  Laterality: Left;   TUBAL LIGATION      FAMILY HISTORY: Family History  Problem Relation Age of Onset   Heart disease Mother    Stroke Mother    Colon cancer Father    Breast cancer Sister        1/2 sister   Brain cancer Brother     ADVANCED DIRECTIVES (Y/N):  N  HEALTH MAINTENANCE: Social History   Tobacco Use   Smoking status: Never   Smokeless tobacco: Never  Vaping Use   Vaping status: Never Used  Substance Use Topics   Alcohol use: Not Currently    Alcohol/week: 1.0 standard drink of alcohol    Types: 1 Glasses of wine per week    Comment: occassionally    Drug use: Not Currently    Types: Crack cocaine     Colonoscopy:  PAP:  Bone density:  Lipid panel:  Allergies  Allergen Reactions   Egg Protein-Containing Drug Products Nausea And Vomiting   Milk-Related Compounds     Current Outpatient Medications  Medication Sig Dispense Refill   albuterol  (VENTOLIN  HFA) 108 (90 Base) MCG/ACT inhaler Inhale 2 puffs into the lungs every 6 (six) hours as needed for wheezing or shortness of breath. 18 g 6   amLODipine  (NORVASC ) 10 MG tablet TAKE 1 TABLET BY MOUTH ONCE DAILY. TO LOWER BLOOD PRESSURE 90 tablet 3   atorvastatin  (LIPITOR) 10 MG tablet Take 1 tablet (10 mg total) by mouth daily. 90 tablet 3   Blood  Pressure Monitor KIT Use to check blood pressure daily 1 kit 0   budesonide -formoterol  (SYMBICORT ) 80-4.5 MCG/ACT inhaler Inhale 2 puffs into the lungs in the morning and at bedtime. 1 each 2   carvedilol  (COREG ) 3.125 MG tablet Take 1 tablet (3.125 mg total) by mouth 2 (two) times daily with a meal. 180 tablet 3   famotidine  (PEPCID ) 20 MG tablet Take 1 tablet (20 mg total) by mouth daily before breakfast. 90 tablet 3   levocetirizine (XYZAL ) 5 MG tablet Take 1 tablet (5 mg total) by mouth every evening. 90 tablet 3   lidocaine-prilocaine (EMLA) cream Apply to affected area once 30 g 3   Misc. Devices MISC CPAP therapy on 17 cm H2O . Patient to use X-Small size Resmed Full Face Mask AirFit F10. Diagnosis - Obstructive sleep apnea 1 each 0   omeprazole  (PRILOSEC  OTC) 20 MG tablet Take 1 tablet (20 mg total) by mouth daily. 28 tablet 1   omeprazole  (PRILOSEC ) 20 MG capsule Take 20 mg by mouth daily.     prochlorperazine (COMPAZINE) 10 MG tablet Take 1 tablet (10 mg total) by mouth every 6 (six) hours as needed for nausea or vomiting. 30 tablet 3   valsartan -hydrochlorothiazide  (DIOVAN -HCT) 320-25 MG tablet Take 1 tablet by mouth daily. 90 tablet 3   Vitamin D , Ergocalciferol , (DRISDOL ) 1.25 MG (50000 UNIT) CAPS capsule Take 1 capsule (50,000 Units total) by mouth every 7 (seven) days. 12 capsule 1   ondansetron  (ZOFRAN ) 8 MG tablet Take 1 tablet (8 mg total) by mouth every 8 (eight) hours as needed for nausea or vomiting. 30 tablet 3   No current facility-administered medications for this visit.    OBJECTIVE: Vitals:   06/28/24 0943  BP: 120/67  Pulse: 83  Resp: 18  Temp: 98.9 F (37.2 C)  SpO2: 100%     Body mass index is 32.1 kg/m.    ECOG FS:0 - Asymptomatic  General: Well-developed, well-nourished, no acute distress. Eyes: Pink conjunctiva, anicteric sclera. HEENT: Normocephalic, moist mucous membranes. Lungs: No audible wheezing or coughing. Heart: Regular rate and  rhythm. Abdomen: Soft, nontender, no obvious distention. Musculoskeletal: No edema, cyanosis, or clubbing. Neuro: Alert, answering all questions appropriately. Cranial nerves grossly intact. Skin: No rashes or petechiae noted. Psych: Normal affect.  LAB RESULTS:  Lab Results  Component Value Date   NA 136 06/28/2024   K 3.3 (L) 06/28/2024   CL 100 06/28/2024   CO2 26 06/28/2024   GLUCOSE 178 (H) 06/28/2024   BUN 17 06/28/2024   CREATININE 0.82 06/28/2024   CALCIUM  9.0 06/28/2024   PROT 7.8 06/28/2024   ALBUMIN 3.9 06/28/2024   AST 20 06/28/2024   ALT 25 06/28/2024   ALKPHOS 85 06/28/2024   BILITOT 0.7 06/28/2024   GFRNONAA >60 06/28/2024   GFRAA 82 01/05/2020    Lab Results  Component Value Date   WBC 8.4 06/28/2024  NEUTROABS 6.0 06/28/2024   HGB 11.5 (L) 06/28/2024   HCT 35.1 (L) 06/28/2024   MCV 88.0 06/28/2024   PLT 387 06/28/2024     STUDIES: ECHOCARDIOGRAM COMPLETE Result Date: 06/27/2024    ECHOCARDIOGRAM REPORT   Patient Name:   GINNETTE GATES Date of Exam: 06/27/2024 Medical Rec #:  [id number]          Height:       64.0 in Accession #:    [id number]         Weight:       190.7 lb Date of Birth:  Oct 01, 1965         BSA:          1.917 m Patient Age:    57 years           BP:           129/73 mmHg Patient Gender: F                  HR:           73 bpm. Exam Location:  [facility] Procedure: 2D Echo, Cardiac Doppler and Color Doppler (Both Spectral and Color            Flow Doppler were utilized during procedure). Indications:     Elevated troponin  History:         Patient has prior history of Echocardiogram examinations, most                  recent 11/11/2023. COPD; Risk Factors:Hypertension and Sleep                  Apnea.  Sonographer:     Christopher Furnace Referring Phys:  [id number] DELAYNE LULLA SOLIAN Diagnosing Phys: Deatrice Cage MD  Sonographer Comments: Technically difficult study due to poor echo windows, no apical window and no subcostal window. Image acquisition  challenging due to COPD. IMPRESSIONS  1. Left ventricular ejection fraction, by estimation, is 60 to 65%. The left ventricle has normal function. The left ventricle has no regional wall motion abnormalities. There is mild left ventricular hypertrophy. Left ventricular diastolic function could not be evaluated.  2. Right ventricular systolic function is normal. The right ventricular size is normal. Tricuspid regurgitation signal is inadequate for assessing PA pressure.  3. Left atrial size was mildly dilated.  4. The mitral valve is normal in structure. No evidence of mitral valve regurgitation. No evidence of mitral stenosis.  5. The aortic valve is normal in structure. Aortic valve regurgitation is not visualized. No aortic stenosis is present. FINDINGS  Left Ventricle: Left ventricular ejection fraction, by estimation, is 60 to 65%. The left ventricle has normal function. The left ventricle has no regional wall motion abnormalities. The left ventricular internal cavity size was normal in size. There is  mild left ventricular hypertrophy. Left ventricular diastolic function could not be evaluated. Right Ventricle: The right ventricular size is normal. No increase in right ventricular wall thickness. Right ventricular systolic function is normal. Tricuspid regurgitation signal is inadequate for assessing PA pressure. Left Atrium: Left atrial size was mildly dilated. Right Atrium: Right atrial size was normal in size. Pericardium: There is no evidence of pericardial effusion. Mitral Valve: The mitral valve is normal in structure. No evidence of mitral valve regurgitation. No evidence of mitral valve stenosis. Tricuspid Valve: The tricuspid valve is normal in structure. Tricuspid valve regurgitation is not demonstrated. No evidence of tricuspid stenosis. Aortic Valve: The aortic valve  is normal in structure. Aortic valve regurgitation is not visualized. No aortic stenosis is present. Pulmonic Valve: The pulmonic valve  was normal in structure. Pulmonic valve regurgitation is not visualized. No evidence of pulmonic stenosis. Aorta: The aortic root is normal in size and structure. Venous: The inferior vena cava was not well visualized. IAS/Shunts: No atrial level shunt detected by color flow Doppler.  LEFT VENTRICLE PLAX 2D LVIDd:         3.55 cm LVIDs:         2.13 cm LV PW:         1.10 cm LV IVS:        1.11 cm LVOT diam:     1.30 cm LVOT Area:     1.33 cm  LEFT ATRIUM         Index LA diam:    4.10 cm 2.14 cm/m   AORTA Ao Root diam: 2.50 cm  SHUNTS Systemic Diam: 1.30 cm Deatrice Cage MD Electronically signed by Deatrice Cage MD Signature Date/Time: 06/27/2024/11:24:36 AM    Final    CT Angio Chest PE W and/or Wo Contrast Result Date: 06/24/2024 EXAM: LUNG CANCER SCREENING CHEST CT WITHOUT AND WITH CONTRAST 06/24/2024 09:31:03 PM TECHNIQUE: Low-dose CT of the chest was performed without and with the administration of 75 mL iohexol (OMNIPAQUE) 350 MG/ML injection. Multiplanar reformatted images are provided for review. Automated exposure control, iterative reconstruction, and/or weight based adjustment of the mA/kV was utilized to reduce the radiation dose to as low as reasonably achievable. COMPARISON: None available. CLINICAL HISTORY: Pulmonary embolism (PE) suspected, high prob. FINDINGS: MEDIASTINUM: Chest port catheter tip is seen. The left ventricle is visualized. The central airways are clear. LYMPH NODES: No mediastinal, hilar or axillary lymphadenopathy. LUNGS AND PLEURA: No focal consolidation or pulmonary edema. No pleural effusion or pneumothorax. No suspicious pulmonary nodules. SOFT TISSUES AND BONES: There is a lobulated soft tissue density measuring 2.3 x 2.4 cm in the right breast 3. No acute abnormality of the bones. UPPER ABDOMEN: Limited images of the upper abdomen demonstrate cholecystectomy clips. No other acute abnormality is seen in the limited upper abdomen. IMPRESSION: 1. No evidence of pulmonary  embolism. 2. Right breast lobulated soft tissue density measuring 2.3 x 2.4 cm; clinical correlation recommended. Electronically signed by: Greig Pique MD 06/24/2024 09:54 PM EST RP Workstation: [id number]   DG Chest 1 View Result Date: 06/24/2024 EXAM: 1 VIEW(S) XRAY OF THE CHEST 06/24/2024 06:53:26 PM COMPARISON: 06/22/2024 CLINICAL HISTORY: Chest pain FINDINGS: LINES, TUBES AND DEVICES: Stable left chest Port-A-Cath with tip in low right atrium. LUNGS AND PLEURA: Persistent low lung volumes. Azygous lobe. Mild right hemidiaphragm elevation. No focal pulmonary opacity. No pulmonary edema. No pleural effusion. No pneumothorax. HEART AND MEDIASTINUM: Borderline cardiomegaly. No acute abnormality of the mediastinal silhouette. BONES AND SOFT TISSUES: No acute osseous abnormality. IMPRESSION: 1. No acute cardiopulmonary process. 2. Borderline cardiomegaly. 3. Left port-a-cath with tip at low right atrium. Electronically signed by: Rockey Kilts MD 06/24/2024 07:39 PM EST RP Workstation: [id number]   NM Cardiac Muga Rest Result Date: 06/24/2024 EXAM: MUGA SCAN 06/24/2024 10:23:04 AM TECHNIQUE: RADIOPHARMACEUTICAL: The patient's erythrocytes were labeled with 22.19 mCi of 99 mTc. Following injection, scintigraphy with ECG gating was acquired. COMPARISON: None available. CLINICAL HISTORY: Chemotherapy patient, assess LV function. FINDINGS: The cardiac chambers and great vessels appear grossly normal in size and configuration. Right ventricular contractility is subjectively normal. Left ventricle ejection fraction is 57.4%. IMPRESSION: 1. Left ventricular ejection fraction is 57.4%. Electronically  signed by: Norleen Boxer MD 06/24/2024 04:30 PM EST RP Workstation: [id number]   DG Chest 1 View Result Date: 06/22/2024 EXAM: 1 VIEW(S) XRAY OF THE CHEST 06/22/2024 10:06:00 AM COMPARISON: 10/29/2023 CLINICAL HISTORY: Pneumothorax FINDINGS: LINES, TUBES AND DEVICES: Left chest power port in place with tip terminating  over the right atrium. LUNGS AND PLEURA: Low lung volumes. Right upper lobe azygos lobe, normal variant. No focal pulmonary opacity. No pulmonary edema. No pleural effusion. No pneumothorax. HEART AND MEDIASTINUM: No acute abnormality of the cardiac and mediastinal silhouettes. BONES AND SOFT TISSUES: No acute osseous abnormality. IMPRESSION: 1. No acute cardiopulmonary process. 2. Low lung volumes,  left chest power port in place. Electronically signed by: Helayne Hurst MD 06/22/2024 10:15 AM EST RP Workstation: [id number]   DG C-Arm 1-60 Min-No Report Result Date: 06/22/2024 Fluoroscopy was utilized by the requesting physician.  No radiographic interpretation.   US  AXILLARY NODE CORE BIOPSY RIGHT Addendum Date: 06/09/2024 ADDENDUM REPORT: 06/09/2024 12:34 ADDENDUM: PATHOLOGY revealed: Site 1. Breast, right, needle core biopsy, breast mass. (heart clip) 10 o'clock, 15 cmfn - INVASIVE DUCTAL CARCINOMA - OVERALL GRADE: 3 - LYMPHOVASCULAR INVASION: NOT IDENTIFIED CANCER LENGTH: 16 MM / 1.6 CM - CALCIFICATIONS: NOT IDENTIFIED Pathology results are CONCORDANT with imaging findings, per Dr. Alm Parkins. PATHOLOGY revealed: Site 2. Lymph node, needle/core biopsy, right axillary lymph node (hydromark butterfly clip)- ONE LYMPH NODE, NEGATIVE FOR METASTATIC CARCINOMA (0/1). Pathology results are CONCORDANT with imaging findings, per Dr. Alm Parkins. Pathology results and recommendations below were discussed with patient via daughter Watson) by telephone on 06/09/2024 by Rock Hover RN. Patient reported biopsy site within normal limits with slight tenderness at the site. Post biopsy care instructions were reviewed, questions were answered and my direct phone number was provided to patient. Patient was instructed to call [facility] if any concerns or questions arise related to the biopsy. RECOMMENDATIONS: 1. Surgical and oncological consultation. Request for surgical and oncological consultation relayed to  Shasta Ada RN at [facility] by Rock Hover RN on 06/09/2024. Pathology results reported by Rock Hover RN on 06/09/2024. Electronically Signed   By: Alm Parkins M.D.   On: 06/09/2024 12:34   Result Date: 06/09/2024 CLINICAL DATA:  Patient presents for ultrasound-guided core needle biopsy of a right breast mass and a right axillary lymph node. EXAM: ULTRASOUND GUIDED RIGHT BREAST CORE NEEDLE BIOPSY ULTRASOUND GUIDED RIGHT AXILLARY LYMPH NODE CORE NEEDLE BIOPSY COMPARISON:  Previous exam(s). PROCEDURE: I met with the patient and we discussed the procedure of ultrasound-guided biopsy, including benefits and alternatives. We discussed the high likelihood of a successful procedure. We discussed the risks of the procedure, including infection, bleeding, tissue injury, clip migration, and inadequate sampling. Informed written consent was given. The usual time-out protocol was performed immediately prior to the procedure. Biopsy #1: 2.5 cm mass at 10 o'clock, 15 cm the nipple. Lesion quadrant: Upper outer quadrant Using sterile technique and 1% Lidocaine as local anesthetic, under direct ultrasound visualization, a 12 gauge spring-loaded device was used to perform biopsy of the mass at 10 o'clock using an inferior approach. At the conclusion of the procedure a heart shaped tissue marker clip was deployed into the biopsy cavity. Biopsy #2: Right axillary lymph node with the mildly thickened cortex. Lesion location: Right axilla Using sterile technique and 1% Lidocaine as local anesthetic, under direct ultrasound visualization, a 14 gauge spring-loaded device was used to perform biopsy of the right axillary lymph node with the mildly thickened cortex using an  inferior approach. At the conclusion of the procedure a HydroMARK, butterfly shaped tissue marker clip was deployed into the biopsy cavity. Follow up 2 view mammogram was performed and dictated separately. IMPRESSION: Ultrasound guided biopsy of a  right breast mass and a right axillary lymph node. No apparent complications. Electronically Signed: By: Alm Parkins M.D. On: 06/08/2024 08:50   US  RT BREAST BX W LOC DEV 1ST LESION IMG BX SPEC US  GUIDE Addendum Date: 06/09/2024 ADDENDUM REPORT: 06/09/2024 12:34 ADDENDUM: PATHOLOGY revealed: Site 1. Breast, right, needle core biopsy, breast mass. (heart clip) 10 o'clock, 15 cmfn - INVASIVE DUCTAL CARCINOMA - OVERALL GRADE: 3 - LYMPHOVASCULAR INVASION: NOT IDENTIFIED CANCER LENGTH: 16 MM / 1.6 CM - CALCIFICATIONS: NOT IDENTIFIED Pathology results are CONCORDANT with imaging findings, per Dr. Alm Parkins. PATHOLOGY revealed: Site 2. Lymph node, needle/core biopsy, right axillary lymph node (hydromark butterfly clip)- ONE LYMPH NODE, NEGATIVE FOR METASTATIC CARCINOMA (0/1). Pathology results are CONCORDANT with imaging findings, per Dr. Alm Parkins. Pathology results and recommendations below were discussed with patient via daughter Watson) by telephone on 06/09/2024 by Rock Hover RN. Patient reported biopsy site within normal limits with slight tenderness at the site. Post biopsy care instructions were reviewed, questions were answered and my direct phone number was provided to patient. Patient was instructed to call [facility] if any concerns or questions arise related to the biopsy. RECOMMENDATIONS: 1. Surgical and oncological consultation. Request for surgical and oncological consultation relayed to Shasta Ada RN at [facility] by Rock Hover RN on 06/09/2024. Pathology results reported by Rock Hover RN on 06/09/2024. Electronically Signed   By: Alm Parkins M.D.   On: 06/09/2024 12:34   Result Date: 06/09/2024 CLINICAL DATA:  Patient presents for ultrasound-guided core needle biopsy of a right breast mass and a right axillary lymph node. EXAM: ULTRASOUND GUIDED RIGHT BREAST CORE NEEDLE BIOPSY ULTRASOUND GUIDED RIGHT AXILLARY LYMPH NODE CORE NEEDLE BIOPSY COMPARISON:   Previous exam(s). PROCEDURE: I met with the patient and we discussed the procedure of ultrasound-guided biopsy, including benefits and alternatives. We discussed the high likelihood of a successful procedure. We discussed the risks of the procedure, including infection, bleeding, tissue injury, clip migration, and inadequate sampling. Informed written consent was given. The usual time-out protocol was performed immediately prior to the procedure. Biopsy #1: 2.5 cm mass at 10 o'clock, 15 cm the nipple. Lesion quadrant: Upper outer quadrant Using sterile technique and 1% Lidocaine as local anesthetic, under direct ultrasound visualization, a 12 gauge spring-loaded device was used to perform biopsy of the mass at 10 o'clock using an inferior approach. At the conclusion of the procedure a heart shaped tissue marker clip was deployed into the biopsy cavity. Biopsy #2: Right axillary lymph node with the mildly thickened cortex. Lesion location: Right axilla Using sterile technique and 1% Lidocaine as local anesthetic, under direct ultrasound visualization, a 14 gauge spring-loaded device was used to perform biopsy of the right axillary lymph node with the mildly thickened cortex using an inferior approach. At the conclusion of the procedure a HydroMARK, butterfly shaped tissue marker clip was deployed into the biopsy cavity. Follow up 2 view mammogram was performed and dictated separately. IMPRESSION: Ultrasound guided biopsy of a right breast mass and a right axillary lymph node. No apparent complications. Electronically Signed: By: Alm Parkins M.D. On: 06/08/2024 08:50   MM CLIP PLACEMENT RIGHT Result Date: 06/08/2024 CLINICAL DATA:  Assess post biopsy marker clip placement following ultrasound-guided core needle biopsy of  a right breast mass and right axillary lymph node. EXAM: 3D DIAGNOSTIC RIGHT MAMMOGRAM POST ULTRASOUND BIOPSY COMPARISON:  Previous exam(s). ACR Breast Density Category b: There are scattered  areas of fibroglandular density. FINDINGS: 3D Mammographic images were obtained following ultrasound guided biopsy of a right breast mass and a right axillary lymph node. The heart shaped biopsy marking clip is within the mass in the upper outer right breast. The right axillary lymph node HydroMARK, butterfly shaped marker clip could not be visualized mammographically. IMPRESSION: Appropriate positioning of the heart shaped biopsy marking clip at the site of biopsy in the mass in the upper outer right breast. Axillary lymph node biopsy marker clip could not be visualized mammographically. Final Assessment: Post Procedure Mammograms for Marker Placement Electronically Signed   By: Alm Parkins M.D.   On: 06/08/2024 09:00   MM 3D DIAGNOSTIC MAMMOGRAM BILATERAL BREAST Result Date: 06/03/2024 CLINICAL DATA:  Palpable area in the RIGHT breast. EXAM: DIGITAL DIAGNOSTIC BILATERAL MAMMOGRAM WITH TOMOSYNTHESIS AND CAD; ULTRASOUND RIGHT BREAST LIMITED TECHNIQUE: Bilateral digital diagnostic mammography and breast tomosynthesis was performed. The images were evaluated with computer-aided detection. ; Targeted ultrasound examination of the right breast was performed COMPARISON:  Previous exam(s). ACR Breast Density Category b: There are scattered areas of fibroglandular density. FINDINGS: Spot compression tomosynthesis views were obtained of the site of palpable concern in the RIGHT breast. There is an irregular oval mass subjacent to the site of palpable concern. An additional questioned asymmetry in the RIGHT outer breast resolves with additional views, consistent with overlapping tissue. No suspicious mass, distortion, or microcalcifications are identified to suggest presence of malignancy in the LEFT breast. On physical exam, there is firmness of the RIGHT upper outer breast. Targeted ultrasound was performed the RIGHT upper outer breast. At 10 o'clock 15 cm from the nipple, there is an oval hypoechoic mass with  irregular margins. It measures 25 x 25 x 21 mm. This corresponds to the mass noted mammographically. Targeted ultrasound was performed of the RIGHT upper outer breast. No suspicious cystic or solid mass is seen at the site of asymmetry concern. Targeted ultrasound was performed of the RIGHT axilla. There is a mildly enlarged RIGHT axillary lymph node which demonstrates focal cortical thickening of approximately 5 mm. Additional RIGHT axillary lymph nodes demonstrate cortical thickness of approximately 3 mm. IMPRESSION: 1. There is a 25 mm mass at the site of palpable concern which is concerning for malignancy. Recommend ultrasound-guided biopsy for definitive characterization. 2. There is a mildly enlarged RIGHT axillary lymph node with focal cortical thickening of 5 mm. Recommend ultrasound-guided biopsy for definitive characterization. 3. No mammographic evidence of malignancy in the LEFT breast. RECOMMENDATION: 1. RIGHT breast ultrasound-guided biopsy x1 2. RIGHT axillary ultrasound-guided biopsy x1 I have discussed the findings and recommendations with the patient and patient's daughters with the assistance of a sign language interpreter. The biopsy procedure was discussed with the patient and questions were answered. Patient expressed their understanding of the biopsy recommendation. Patient will be scheduled for biopsy at her earliest convenience by the schedulers. Ordering provider will be notified. If applicable, a reminder letter will be sent to the patient regarding the next appointment. BI-RADS CATEGORY  5: Highly suggestive of malignancy. Electronically Signed   By: Corean Salter M.D.   On: 06/03/2024 16:13   US  LIMITED ULTRASOUND INCLUDING AXILLA RIGHT BREAST Result Date: 06/03/2024 CLINICAL DATA:  Palpable area in the RIGHT breast. EXAM: DIGITAL DIAGNOSTIC BILATERAL MAMMOGRAM WITH TOMOSYNTHESIS AND CAD; ULTRASOUND RIGHT BREAST LIMITED  TECHNIQUE: Bilateral digital diagnostic mammography and  breast tomosynthesis was performed. The images were evaluated with computer-aided detection. ; Targeted ultrasound examination of the right breast was performed COMPARISON:  Previous exam(s). ACR Breast Density Category b: There are scattered areas of fibroglandular density. FINDINGS: Spot compression tomosynthesis views were obtained of the site of palpable concern in the RIGHT breast. There is an irregular oval mass subjacent to the site of palpable concern. An additional questioned asymmetry in the RIGHT outer breast resolves with additional views, consistent with overlapping tissue. No suspicious mass, distortion, or microcalcifications are identified to suggest presence of malignancy in the LEFT breast. On physical exam, there is firmness of the RIGHT upper outer breast. Targeted ultrasound was performed the RIGHT upper outer breast. At 10 o'clock 15 cm from the nipple, there is an oval hypoechoic mass with irregular margins. It measures 25 x 25 x 21 mm. This corresponds to the mass noted mammographically. Targeted ultrasound was performed of the RIGHT upper outer breast. No suspicious cystic or solid mass is seen at the site of asymmetry concern. Targeted ultrasound was performed of the RIGHT axilla. There is a mildly enlarged RIGHT axillary lymph node which demonstrates focal cortical thickening of approximately 5 mm. Additional RIGHT axillary lymph nodes demonstrate cortical thickness of approximately 3 mm. IMPRESSION: 1. There is a 25 mm mass at the site of palpable concern which is concerning for malignancy. Recommend ultrasound-guided biopsy for definitive characterization. 2. There is a mildly enlarged RIGHT axillary lymph node with focal cortical thickening of 5 mm. Recommend ultrasound-guided biopsy for definitive characterization. 3. No mammographic evidence of malignancy in the LEFT breast. RECOMMENDATION: 1. RIGHT breast ultrasound-guided biopsy x1 2. RIGHT axillary ultrasound-guided biopsy x1 I  have discussed the findings and recommendations with the patient and patient's daughters with the assistance of a sign language interpreter. The biopsy procedure was discussed with the patient and questions were answered. Patient expressed their understanding of the biopsy recommendation. Patient will be scheduled for biopsy at her earliest convenience by the schedulers. Ordering provider will be notified. If applicable, a reminder letter will be sent to the patient regarding the next appointment. BI-RADS CATEGORY  5: Highly suggestive of malignancy. Electronically Signed   By: Corean Salter M.D.   On: 06/03/2024 16:13   US  PELVIC COMPLETE WITH TRANSVAGINAL Result Date: 05/31/2024 EXAM: US  Pelvis, Complete Transvaginal and Transabdominal without Doppler TECHNIQUE: Transabdominal and transvaginal pelvic duplex ultrasound using B-mode/gray scaled imaging without Doppler spectral analysis and color flow was obtained. COMPARISON: CT abdomen and pelvis 06/10/2016 CLINICAL HISTORY: postmenopausal bleeding x6 months. FINDINGS: UTERUS: Uterus measures 5.7 x 5.1 x 6.4 cm with a volume of 96 ml. 10.6 cm fibroid in the anterior uterus and 2.0 cm fibroid in the left uterine fundus. ENDOMETRIAL STRIPE: Heterogeneous poorly defined endometrium measuring 13 mm. There is an endometrial polyp in the lower uterine segment measuring 2.1 cm. RIGHT OVARY: The ovaries were not visualized. LEFT OVARY: The ovaries were not visualized. FREE FLUID: No free fluid in the pelvis. IMPRESSION: 1. Heterogeneous, poorly defined endometrium measuring 13 mm with a 2.1 cm endometrial polyp in the lower uterine segment. In the setting of post-menopausal bleeding, endometrial sampling is indicated to exclude carcinoma. If results are benign, sonohysterogram should be considered for focal lesion work-up. (Ref: Radiological Reasoning: Algorithmic Workup of Abnormal Vaginal Bleeding with Endovaginal Sonography and Sonohysterography. AJR 2008;  808:D31-26) 2. Fibroid uterus. 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  After lengthy discussion with the patient, she agreed to proceed with neoadjuvant chemotherapy with Adriamycin and Cytoxan x 4 followed by weekly Taxol x 12.  This will then be followed by lumpectomy and adjuvant XRT.  After completion of all her treatments, she will benefit from 5 years of letrozole given the ER positivity.  MUGA scan from June 24, 2024 revealed an EF of 57%.  Patient has had port placement.  Proceed with cycle 1 of Adriamycin and Cytoxan today.  Return to clinic in 2 days for G-CSF support, 1 week for laboratory work and further evaluation, and then in 2 weeks for further evaluation and consideration of cycle 2.  Family history: Patient reports her sister also has breast cancer and she was given a referral to genetics. Chest pain/palpitations: Resolved.  Patient was given a referral to cardiology today. Hypokalemia: Patient was given dietary changes. Anemia: Chronic and unchanged.  Patient's hemoglobin is 11.5 today.  The entire visit was done in the presence of an ASL interpreter.  Patient expressed understanding and was in agreement with this plan. She also understands that She can call clinic at any time with any questions, concerns, or complaints.    Cancer Staging  Invasive ductal carcinoma of right breast [facility]) Staging form: Breast, AJCC 8th Edition - Clinical stage from 06/16/2024: Stage IIB (cT2, cN0, cM0, G3, ER+, PR-, HER2-) - Signed by Jacobo Evalene PARAS, MD on 06/16/2024 Stage prefix: Initial diagnosis Histologic grading system: 3 grade system   Evalene PARAS Jacobo, MD   06/30/2024 8:23 AM

## 2024-06-28 NOTE — Transitions of Care (Post Inpatient/ED Visit) (Signed)
 06/28/2024  Name: Caitlin [name] MRN: [id number] DOB: 12-10-1965  Today's TOC FU Call Status: Today's TOC FU Call Status:: Successful TOC FU Call Completed TOC FU Call Complete Date: 06/28/24  Patient's Name and Date of Birth confirmed. Name, DOB  Transition Care Management Follow-up Telephone Call Date of Discharge: 06/27/24 Discharge Facility: [facility]) Type of Discharge: Inpatient Admission Primary Inpatient Discharge Diagnosis:: chest pain How have you been since you were released from the hospital?: Better  Items Reviewed: Did you receive and understand the discharge instructions provided?: Yes Medications obtained,verified, and reconciled?: Yes (Medications Reviewed) Any new allergies since your discharge?: No Dietary orders reviewed?: Yes Do you have support at home?: Yes People in Home [RPT]: child(ren), adult  Medications Reviewed Today: Medications Reviewed Today     Reviewed by Emmitt Pan, LPN (Licensed Practical Nurse) on 06/28/24 at 1550  Med List Status: <None>   Medication Order Taking? Sig Documenting Provider Last Dose Status Informant  0.9 %  sodium chloride  infusion [id number]   Jacobo Evalene PARAS, MD  Active   albuterol  (VENTOLIN  HFA) 108 (90 Base) MCG/ACT inhaler [id number]  Inhale 2 puffs into the lungs every 6 (six) hours as needed for wheezing or shortness of breath. Donzella Lauraine SAILOR, DO  Active Family Member  amLODipine  (NORVASC ) 10 MG tablet [id number]  TAKE 1 TABLET BY MOUTH ONCE DAILY. TO LOWER BLOOD PRESSURE Pardue, Lauraine SAILOR, DO  Active Family Member  atorvastatin  (LIPITOR) 10 MG tablet [id number]  Take 1 tablet (10 mg total) by mouth daily. Donzella Lauraine SAILOR, DO  Active Family Member  Blood Pressure Monitor KIT [id number]  Use to check blood pressure daily Fulp, Cammie, MD  Active Family Member  budesonide -formoterol  (SYMBICORT ) 80-4.5 MCG/ACT inhaler [id number]  Inhale 2 puffs into the lungs in the morning and at  bedtime. Kasa, Kurian, MD  Active Family Member  carvedilol  (COREG ) 3.125 MG tablet [id number]  Take 1 tablet (3.125 mg total) by mouth 2 (two) times daily with a meal. Pardue, Lauraine SAILOR, DO  Active Family Member  famotidine  (PEPCID ) 20 MG tablet [id number]  Take 1 tablet (20 mg total) by mouth daily before breakfast. Donzella Lauraine SAILOR, DO  Active Family Member  levocetirizine (XYZAL ) 5 MG tablet [id number]  Take 1 tablet (5 mg total) by mouth every evening. Donzella Lauraine SAILOR, DO  Active Family Member  lidocaine-prilocaine (EMLA) cream [id number]  Apply to affected area once Finnegan, Timothy J, MD  Active Family Member  Misc. Devices MISC [id number]  CPAP therapy on 17 cm H2O . Patient to use X-Small size Resmed Full Face Mask AirFit F10. Diagnosis - Obstructive sleep apnea Delbert Clam, MD  Active Family Member  omeprazole  (PRILOSEC  OTC) 20 MG tablet [id number]  Take 1 tablet (20 mg total) by mouth daily. Wouk, Devaughn Sayres, MD  Active   omeprazole  (PRILOSEC ) 20 MG capsule [id number]  Take 20 mg by mouth daily. [provider]  Active   ondansetron  (ZOFRAN ) 8 MG tablet [id number]  Take 1 tablet (8 mg total) by mouth every 8 (eight) hours as needed for nausea or vomiting. Jacobo Evalene PARAS, MD  Active   prochlorperazine (COMPAZINE) 10 MG tablet [id number]  Take 1 tablet (10 mg total) by mouth every 6 (six) hours as needed for nausea or vomiting. Jacobo Evalene PARAS, MD  Active   valsartan -hydrochlorothiazide  (DIOVAN -HCT) 320-25 MG tablet [id number]  Take 1 tablet by mouth daily. Donzella Lauraine SAILOR, DO  Active Family Member  Vitamin D , Ergocalciferol , (DRISDOL ) 1.25  MG (50000 UNIT) CAPS capsule [id number]  Take 1 capsule (50,000 Units total) by mouth every 7 (seven) days. Donzella Lauraine SAILOR, DO  Active Family Member            Home Care and Equipment/Supplies: Were Home Health Services Ordered?: NA Any new equipment or medical supplies ordered?: NA  Functional Questionnaire: Do you need assistance  with bathing/showering or dressing?: No Do you need assistance with meal preparation?: No Do you need assistance with eating?: No Do you have difficulty maintaining continence: No Do you need assistance with getting out of bed/getting out of a chair/moving?: No Do you have difficulty managing or taking your medications?: No  Follow up appointments reviewed: PCP Follow-up appointment confirmed?: Yes Date of PCP follow-up appointment?: 07/04/24 Follow-up Provider: pardue Specialist [facility] Follow-up appointment confirmed?: Yes Date of Specialist follow-up appointment?: 06/28/24 Follow-Up Specialty Provider:: onco Do you need transportation to your follow-up appointment?: No Do you understand care options if your condition(s) worsen?: Yes-patient verbalized understanding    SIGNATURE Julian Lemmings, LPN [facility] Nurse Health Advisor Direct Dial [phone]

## 2024-06-28 NOTE — Progress Notes (Unsigned)
 Patient had to go to the hospital due to her heart racing, they kept her for a few days, but just told her to call and make her an appointment with a cardiologist. They put her on omeprazole . Since getting discharged yesterday she feels a little better.

## 2024-06-29 ENCOUNTER — Telehealth: Payer: Self-pay

## 2024-06-29 NOTE — Telephone Encounter (Signed)
 Telephone call to patient for follow up after receiving first infusion.   Patient daughter states infusion went great.  States eating good and drinking plenty of fluids.   Denies any nausea or vomiting.  Encouraged patient daughter to call for any concerns or questions.

## 2024-06-30 ENCOUNTER — Encounter: Payer: Self-pay | Admitting: Oncology

## 2024-06-30 ENCOUNTER — Other Ambulatory Visit: Payer: Self-pay

## 2024-06-30 ENCOUNTER — Inpatient Hospital Stay

## 2024-06-30 ENCOUNTER — Ambulatory Visit: Admitting: General Surgery

## 2024-06-30 DIAGNOSIS — Z5111 Encounter for antineoplastic chemotherapy: Secondary | ICD-10-CM | POA: Diagnosis not present

## 2024-06-30 DIAGNOSIS — C50911 Malignant neoplasm of unspecified site of right female breast: Secondary | ICD-10-CM

## 2024-06-30 MED ORDER — PEGFILGRASTIM-CBQV 6 MG/0.6ML ~~LOC~~ SOSY
6.0000 mg | PREFILLED_SYRINGE | Freq: Once | SUBCUTANEOUS | Status: AC
  Administered 2024-06-30: 6 mg via SUBCUTANEOUS
  Filled 2024-06-30: qty 0.6

## 2024-07-01 ENCOUNTER — Other Ambulatory Visit: Payer: Self-pay

## 2024-07-04 ENCOUNTER — Ambulatory Visit: Admitting: Family Medicine

## 2024-07-04 ENCOUNTER — Encounter: Payer: Self-pay | Admitting: Family Medicine

## 2024-07-04 VITALS — BP 125/82 | HR 75 | Temp 98.5°F | Ht 64.0 in | Wt 189.0 lb

## 2024-07-04 DIAGNOSIS — E782 Mixed hyperlipidemia: Secondary | ICD-10-CM | POA: Diagnosis not present

## 2024-07-04 DIAGNOSIS — E7841 Elevated Lipoprotein(a): Secondary | ICD-10-CM | POA: Diagnosis not present

## 2024-07-04 DIAGNOSIS — R7989 Other specified abnormal findings of blood chemistry: Secondary | ICD-10-CM | POA: Diagnosis not present

## 2024-07-04 DIAGNOSIS — R1314 Dysphagia, pharyngoesophageal phase: Secondary | ICD-10-CM | POA: Diagnosis not present

## 2024-07-04 MED ORDER — ATORVASTATIN CALCIUM 20 MG PO TABS: 20.0000 mg | ORAL_TABLET | Freq: Every day | ORAL | 1 refills | Status: AC

## 2024-07-04 MED ORDER — NITROGLYCERIN 0.4 MG SL SUBL: 0.4000 mg | SUBLINGUAL_TABLET | SUBLINGUAL | 1 refills | Status: AC | PRN

## 2024-07-04 NOTE — Patient Instructions (Signed)
 SABRA  Please review the attached list of medications and notify my office if there are any errors.   . Please bring all of your medications to every appointment so we can make sure that our medication list is the same as yours.

## 2024-07-04 NOTE — Progress Notes (Signed)
 Established patient visit   Patient: Caitlin [name]   DOB: August 20, 1965   58 y.o. Female  MRN: [id number] Visit Date: 07/04/2024  Today's healthcare provider: Nancyann Perry, MD   Chief Complaint  Patient presents with   Follow-up    Pt states she has two bruises on stomach and is unaware how. Would also like to discuss dry mouth and lips since cancer dx.    Subjective    Discussed the use of AI scribe software for clinical note transcription with the patient, who gave verbal consent to proceed.  History of Present Illness   Caitlin [name] is a 58 year old female patient of Dr. Donzella presents for follow-up after hospitalization for chest pain and elevated troponins.  She was hospitalized from November 8th to November 10th for chest pain and elevated troponins, with a peak level of 31. A chest CT angiogram was negative for pulmonary embolism or other acute pathology. Her chest pain has improved since discharge, though she occasionally experiences palpitations. She has been resting frequently since returning home. During her hospitalization, an echocardiogram showed a mildly dilated left atrium, and her EKG at admission was notable for findings consistent with an old anteroseptal infarct. She was already on atorvastatin  10 mg once daily prior to her hospitalization and has a cardiology follow-up scheduled for November 24th.  She has a history of gastroesophageal reflux disease (GERD), which she initially thought might have contributed to her chest pain. She experiences heartburn when she feels full and sometimes feels like she is going to vomit, which occurred two days ago. She reports that food feels like it gets stuck and her body wants to 'throw it out.' This has been a persistent issue despite medication changes.  She notes bruising and wonders if it could be related to blood clots, as mentioned by a nurse during her hospital stay. No family history of heart problems, although  her mother, who smoked for many years, had heart problems and passed away three years ago.  She reports having dry lips and is unsure if it is related to her chemotherapy treatments. She has recently started chemotherapy, with a session last Monday or Tuesday and another scheduled for next week.     Lipoprotein (a)  Date Value Ref Range Status  06/25/2024 158.9 (H) <75.0 nmol/L Final   Lab Results  Component Value Date   CHOL 152 02/22/2024   HDL 35 (L) 02/22/2024   LDLCALC 96 02/22/2024   TRIG 117 02/22/2024   CHOLHDL 4.3 02/22/2024   Lab Results  Component Value Date   TSH 2.341 06/24/2024   T4TOTAL 6.7 01/05/2020   FREET4 1.39 (H) 06/24/2024     Medications: Outpatient Medications Prior to Visit  Medication Sig   albuterol  (VENTOLIN  HFA) 108 (90 Base) MCG/ACT inhaler Inhale 2 puffs into the lungs every 6 (six) hours as needed for wheezing or shortness of breath.   amLODipine  (NORVASC ) 10 MG tablet TAKE 1 TABLET BY MOUTH ONCE DAILY. TO LOWER BLOOD PRESSURE   Blood Pressure Monitor KIT Use to check blood pressure daily   budesonide -formoterol  (SYMBICORT ) 80-4.5 MCG/ACT inhaler Inhale 2 puffs into the lungs in the morning and at bedtime.   carvedilol  (COREG ) 3.125 MG tablet Take 1 tablet (3.125 mg total) by mouth 2 (two) times daily with a meal.   famotidine  (PEPCID ) 20 MG tablet Take 1 tablet (20 mg total) by mouth daily before breakfast.   levocetirizine (XYZAL ) 5 MG tablet Take 1  tablet (5 mg total) by mouth every evening.   lidocaine-prilocaine (EMLA) cream Apply to affected area once   Misc. Devices MISC CPAP therapy on 17 cm H2O . Patient to use X-Small size Resmed Full Face Mask AirFit F10. Diagnosis - Obstructive sleep apnea   omeprazole  (PRILOSEC  OTC) 20 MG tablet Take 1 tablet (20 mg total) by mouth daily.   omeprazole  (PRILOSEC ) 20 MG capsule Take 20 mg by mouth daily.   ondansetron  (ZOFRAN ) 8 MG tablet Take 1 tablet (8 mg total) by mouth every 8 (eight) hours as  needed for nausea or vomiting.   prochlorperazine (COMPAZINE) 10 MG tablet Take 1 tablet (10 mg total) by mouth every 6 (six) hours as needed for nausea or vomiting.   valsartan -hydrochlorothiazide  (DIOVAN -HCT) 320-25 MG tablet Take 1 tablet by mouth daily.   Vitamin D , Ergocalciferol , (DRISDOL ) 1.25 MG (50000 UNIT) CAPS capsule Take 1 capsule (50,000 Units total) by mouth every 7 (seven) days.   atorvastatin  (LIPITOR) 10 MG tablet Take 1 tablet (10 mg total) by mouth daily.   No facility-administered medications prior to visit.   Review of Systems     Objective    BP 125/82   Pulse 75   Temp 98.5 F (36.9 C)   Ht 5' 4 (1.626 m)   Wt 189 lb (85.7 kg)   LMP 05/08/2016   SpO2 100%   BMI 32.44 kg/m   Physical Exam   General: Appearance:    Mildly obese female in no acute distress  Eyes:    PERRL, conjunctiva/corneas clear, EOM's intact       Lungs:     Clear to auscultation bilaterally, respirations unlabored  Heart:    Normal heart rate. Normal rhythm. No murmurs, rubs, or gallops.    MS:   All extremities are intact.    Neurologic:   Awake, alert, oriented x 3. No apparent focal neurological defect.         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 Bruising noted. Differential includes cardiac and gastrointestinal causes. - Prescribed nitroglycerin  for severe chest pain episodes. - Ensure follow-up with cardiologist on November 24th.  Mixed hyperlipidemia with elevated lipoprotein(a) Elevated lipoprotein(a) at 158.9, contributing to cardiovascular risk. - Increased atorvastatin  dosage.  Gastroesophageal reflux disease with dysphagia, pharyngoesophageal phase Long-standing GERD with recent exacerbation of symptoms, including dysphagia and sensation of food  impaction. Possible esophageal stricture considered. - Ordered swallow study to evaluate for esophageal stricture.  History of cancer, planned chemotherapy Recent initiation of chemotherapy with ongoing treatment scheduled. Reports dry lips, possibly related to cancer or medications. - Advised use of Chapstick for dry lips.     Return in about 1 month (around 08/03/2024). With PCP     Nancyann Perry, MD  [facility] [phone] (phone) [phone] (fax)  [facility]

## 2024-07-05 ENCOUNTER — Ambulatory Visit: Admitting: Obstetrics & Gynecology

## 2024-07-05 ENCOUNTER — Other Ambulatory Visit: Payer: Self-pay | Admitting: Obstetrics

## 2024-07-05 ENCOUNTER — Telehealth: Payer: Self-pay | Admitting: Obstetrics

## 2024-07-05 VITALS — BP 108/73 | HR 75 | Ht 64.0 in | Wt 189.9 lb

## 2024-07-05 DIAGNOSIS — N950 Postmenopausal bleeding: Secondary | ICD-10-CM

## 2024-07-05 DIAGNOSIS — R9389 Abnormal findings on diagnostic imaging of other specified body structures: Secondary | ICD-10-CM

## 2024-07-05 DIAGNOSIS — N840 Polyp of corpus uteri: Secondary | ICD-10-CM

## 2024-07-05 NOTE — Progress Notes (Signed)
    GYNECOLOGY PROGRESS NOTE  Subjective:    Patient ID: Caitlin [name], female    DOB: [birth year], 58 y.o.   MRN: [id number]  HPI  Patient is a 58 y.o. separated G3P3003 here as a new patient for evaluation of PMB. She had menarche around 58 years old and then menopause around 58 yo. She started having some bleeding not related to sex in April 2025 and has had about 3-4 occasions of bleeding. She had a pelvic ultrasound done last month that showed the following:  1. Heterogeneous, poorly defined endometrium measuring 13 mm with a 2.1 cm endometrial polyp in the lower uterine segment.   She was recent diagnosed with right breast cancer Stage 11B ER postitive, PR/HER2 negative and is getting ready to start chemo. She has never taken tamoxifen.  The following portions of the patient's history were reviewed and updated as appropriate: allergies, current medications, past family history, past medical history, past social history, past surgical history, and problem list.  Review of Systems Pertinent items are noted in HPI.   Objective:   Blood pressure 108/73, pulse 75, height 5' 4 (1.626 m), weight 189 lb 14.4 oz (86.1 kg), last menstrual period 05/08/2016. Body mass index is 32.6 kg/m. Well nourished, well hydrated Black female, no apparent distress She is ambulating normally. She has congenital deafness. Her daughter is with her and is interpreting. She declines the iPad interpretor.      Assessment:   PMB Thickened endometrium Uterine polyp  Plan:   We discussed EMBX in the office as well as hysteroscopy, dilation and curettage. She opts for the OR procedures for thoroughness. This will be done by Dr. Leigh. She is aware of this.

## 2024-07-05 NOTE — Telephone Encounter (Signed)
 Contacted patient to offer her a procedure date of 07/18/2024 or 08/01/2024. Patient states she will call the office once she has spoken with her daughter to decide on a date.

## 2024-07-07 ENCOUNTER — Other Ambulatory Visit: Payer: Self-pay

## 2024-07-07 ENCOUNTER — Other Ambulatory Visit: Payer: Self-pay | Admitting: *Deleted

## 2024-07-07 DIAGNOSIS — C50911 Malignant neoplasm of unspecified site of right female breast: Secondary | ICD-10-CM

## 2024-07-08 ENCOUNTER — Inpatient Hospital Stay

## 2024-07-08 ENCOUNTER — Encounter: Payer: Self-pay | Admitting: Oncology

## 2024-07-08 ENCOUNTER — Inpatient Hospital Stay (HOSPITAL_BASED_OUTPATIENT_CLINIC_OR_DEPARTMENT_OTHER): Admitting: Oncology

## 2024-07-08 VITALS — BP 139/86 | HR 68 | Temp 97.4°F | Resp 18 | Ht 64.0 in | Wt 187.0 lb

## 2024-07-08 DIAGNOSIS — C50911 Malignant neoplasm of unspecified site of right female breast: Secondary | ICD-10-CM | POA: Diagnosis not present

## 2024-07-08 DIAGNOSIS — Z5111 Encounter for antineoplastic chemotherapy: Secondary | ICD-10-CM | POA: Diagnosis not present

## 2024-07-08 LAB — CBC WITH DIFFERENTIAL/PLATELET
Abs Immature Granulocytes: 1.61 K/uL — ABNORMAL HIGH (ref 0.00–0.07)
Basophils Absolute: 0.1 K/uL (ref 0.0–0.1)
Basophils Relative: 1 %
Eosinophils Absolute: 0 K/uL (ref 0.0–0.5)
Eosinophils Relative: 0 %
HCT: 29.1 % — ABNORMAL LOW (ref 36.0–46.0)
Hemoglobin: 9.9 g/dL — ABNORMAL LOW (ref 12.0–15.0)
Immature Granulocytes: 17 %
Lymphocytes Relative: 12 %
Lymphs Abs: 1.1 K/uL (ref 0.7–4.0)
MCH: 29.8 pg (ref 26.0–34.0)
MCHC: 34 g/dL (ref 30.0–36.0)
MCV: 87.7 fL (ref 80.0–100.0)
Monocytes Absolute: 0.7 K/uL (ref 0.1–1.0)
Monocytes Relative: 8 %
Neutro Abs: 5.8 K/uL (ref 1.7–7.7)
Neutrophils Relative %: 62 %
Platelets: 228 K/uL (ref 150–400)
RBC: 3.32 MIL/uL — ABNORMAL LOW (ref 3.87–5.11)
RDW: 12.4 % (ref 11.5–15.5)
Smear Review: NORMAL
WBC: 9.4 K/uL (ref 4.0–10.5)
nRBC: 0.2 % (ref 0.0–0.2)

## 2024-07-08 LAB — CMP (CANCER CENTER ONLY)
ALT: 15 U/L (ref 0–44)
AST: 21 U/L (ref 15–41)
Albumin: 3.7 g/dL (ref 3.5–5.0)
Alkaline Phosphatase: 91 U/L (ref 38–126)
Anion gap: 9 (ref 5–15)
BUN: 12 mg/dL (ref 6–20)
CO2: 26 mmol/L (ref 22–32)
Calcium: 8.5 mg/dL — ABNORMAL LOW (ref 8.9–10.3)
Chloride: 99 mmol/L (ref 98–111)
Creatinine: 0.85 mg/dL (ref 0.44–1.00)
GFR, Estimated: >60 mL/min (ref >60)
Glucose, Bld: 163 mg/dL — ABNORMAL HIGH (ref 70–99)
Potassium: 3 mmol/L — ABNORMAL LOW (ref 3.5–5.1)
Sodium: 134 mmol/L — ABNORMAL LOW (ref 135–145)
Total Bilirubin: 0.6 mg/dL (ref 0.0–1.2)
Total Protein: 7 g/dL (ref 6.5–8.1)

## 2024-07-08 NOTE — Progress Notes (Signed)
 Patient states that Thursday she was having very bad back pain, it stopped after two days. Bruise on her stomach.

## 2024-07-08 NOTE — Progress Notes (Signed)
 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  INTERVAL HISTORY: Patient returns to clinic today for repeat laboratory work, further evaluation and to assess her toleration of cycle 1 of neoadjuvant Adriamycin  and Cytoxan .  She currently feels well and is asymptomatic.  She tolerated her treatment well without significant side effects. She has no neurologic complaints.  She denies any recent fevers or illnesses.  She has a good appetite and denies weight loss.  She has no chest pain, shortness of breath, cough, or hemoptysis.  She denies any nausea, vomiting, constipation, or diarrhea.  She has no urinary complaints.  Patient offers no specific complaints today.  REVIEW OF SYSTEMS:   Review of Systems  Constitutional: Negative.  Negative for fever, malaise/fatigue and weight loss.  Respiratory: Negative.  Negative for cough, hemoptysis and shortness of breath.   Cardiovascular: Negative.  Negative for chest pain and leg swelling.  Gastrointestinal: Negative.  Negative for abdominal pain.  Genitourinary: Negative.  Negative for dysuria.  Musculoskeletal: Negative.  Negative for back pain.  Skin: Negative.  Negative for rash.  Neurological: Negative.  Negative for dizziness, focal weakness and weakness.  Psychiatric/Behavioral: Negative.  The patient is not nervous/anxious.     As per HPI. Otherwise, a complete review of systems is negative.  PAST MEDICAL HISTORY: Past Medical History:  Diagnosis Date   Asthma    Cancer (HCC)    COPD (chronic obstructive pulmonary disease) (HCC)    Deaf    GERD  (gastroesophageal reflux disease)    Hypertension 2011   NO (nasal obstruction)    OSA (obstructive sleep apnea)     PAST SURGICAL HISTORY: Past Surgical History:  Procedure Laterality Date   BREAST BIOPSY Right 06/08/2024   US  RT BREAST BX W LOC DEV 1ST LESION IMG BX SPEC US  GUIDE 06/08/2024 [facility]   CESAREAN SECTION     x 2   CHOLECYSTECTOMY     DENTAL SURGERY Bilateral 04/2024   Removed both fang teeth on the top and wisdom as well.   PORTACATH PLACEMENT Left 06/22/2024   Procedure: INSERTION, TUNNELED CENTRAL VENOUS DEVICE, WITH PORT;  Surgeon: Marinda Jayson KIDD, MD;  Location: [facility];  Service: General;  Laterality: Left;   TUBAL LIGATION      FAMILY HISTORY: Family History  Problem Relation Age of Onset   Heart disease Mother    Stroke Mother    Colon cancer Father    Breast cancer Sister        1/2 sister   Brain cancer Brother     ADVANCED DIRECTIVES (Y/N):  N  HEALTH MAINTENANCE: Social History   Tobacco Use   Smoking status: Never   Smokeless tobacco: Never  Vaping Use   Vaping status: Never Used  Substance Use Topics   Alcohol use: Not Currently    Alcohol/week: 1.0 standard drink of alcohol    Types: 1 Glasses of wine per week    Comment: occassionally    Drug use: Not Currently    Types: Crack cocaine     Colonoscopy:  PAP:  Bone density:  Lipid panel:  Allergies  Allergen Reactions   Egg Protein-Containing Drug  Products Nausea And Vomiting   Milk-Related Compounds     Current Outpatient Medications  Medication Sig Dispense Refill   albuterol  (VENTOLIN  HFA) 108 (90 Base) MCG/ACT inhaler Inhale 2 puffs into the lungs every 6 (six) hours as needed for wheezing or shortness of breath. 18 g 6   amLODipine  (NORVASC ) 10 MG tablet TAKE 1 TABLET BY MOUTH ONCE DAILY. TO LOWER BLOOD PRESSURE 90 tablet 3   atorvastatin  (LIPITOR) 20 MG tablet Take 1 tablet (20 mg total) by mouth daily. 90 tablet 1   Blood Pressure Monitor KIT Use to  check blood pressure daily 1 kit 0   budesonide -formoterol  (SYMBICORT ) 80-4.5 MCG/ACT inhaler Inhale 2 puffs into the lungs in the morning and at bedtime. 1 each 2   carvedilol  (COREG ) 3.125 MG tablet Take 1 tablet (3.125 mg total) by mouth 2 (two) times daily with a meal. 180 tablet 3   famotidine  (PEPCID ) 20 MG tablet Take 1 tablet (20 mg total) by mouth daily before breakfast. 90 tablet 3   levocetirizine (XYZAL ) 5 MG tablet Take 1 tablet (5 mg total) by mouth every evening. 90 tablet 3   lidocaine -prilocaine  (EMLA ) cream Apply to affected area once 30 g 3   Misc. Devices MISC CPAP therapy on 17 cm H2O . Patient to use X-Small size Resmed Full Face Mask AirFit F10. Diagnosis - Obstructive sleep apnea 1 each 0   nitroGLYCERIN  (NITROSTAT ) 0.4 MG SL tablet Place 1 tablet (0.4 mg total) under the tongue every 5 (five) minutes as needed for chest pain. 20 tablet 1   omeprazole  (PRILOSEC  OTC) 20 MG tablet Take 1 tablet (20 mg total) by mouth daily. 28 tablet 1   omeprazole  (PRILOSEC ) 20 MG capsule Take 20 mg by mouth daily.     ondansetron  (ZOFRAN ) 8 MG tablet Take 1 tablet (8 mg total) by mouth every 8 (eight) hours as needed for nausea or vomiting. 30 tablet 3   prochlorperazine  (COMPAZINE ) 10 MG tablet Take 1 tablet (10 mg total) by mouth every 6 (six) hours as needed for nausea or vomiting. 30 tablet 3   valsartan -hydrochlorothiazide  (DIOVAN -HCT) 320-25 MG tablet Take 1 tablet by mouth daily. 90 tablet 3   Vitamin D , Ergocalciferol , (DRISDOL ) 1.25 MG (50000 UNIT) CAPS capsule Take 1 capsule (50,000 Units total) by mouth every 7 (seven) days. 12 capsule 1   No current facility-administered medications for this visit.    OBJECTIVE: Vitals:   07/08/24 1022  BP: 139/86  Pulse: 68  Resp: 18  Temp: (!) 97.4 F (36.3 C)  SpO2: 100%     Body mass index is 32.1 kg/m.    ECOG FS:0 - Asymptomatic  General: Well-developed, well-nourished, no acute distress. Eyes: Pink conjunctiva, anicteric  sclera. HEENT: Normocephalic, moist mucous membranes. Lungs: No audible wheezing or coughing. Heart: Regular rate and rhythm. Abdomen: Soft, nontender, no obvious distention. Musculoskeletal: No edema, cyanosis, or clubbing. Neuro: Alert, answering all questions appropriately. Cranial nerves grossly intact. Skin: No rashes or petechiae noted. Psych: Normal affect.  LAB RESULTS:  Lab Results  Component Value Date   NA 136 06/28/2024   K 3.3 (L) 06/28/2024   CL 100 06/28/2024   CO2 26 06/28/2024   GLUCOSE 178 (H) 06/28/2024   BUN 17 06/28/2024   CREATININE 0.82 06/28/2024   CALCIUM  9.0 06/28/2024   PROT 7.8 06/28/2024   ALBUMIN 3.9 06/28/2024   AST 20 06/28/2024   ALT 25 06/28/2024   ALKPHOS 85 06/28/2024   BILITOT 0.7 06/28/2024  GFRNONAA >60 06/28/2024   GFRAA 82 01/05/2020    Lab Results  Component Value Date   WBC 8.4 06/28/2024   NEUTROABS 6.0 06/28/2024   HGB 11.5 (L) 06/28/2024   HCT 35.1 (L) 06/28/2024   MCV 88.0 06/28/2024   PLT 387 06/28/2024     STUDIES: ECHOCARDIOGRAM COMPLETE Result Date: 06/27/2024    ECHOCARDIOGRAM REPORT   Patient Name:   ASTRID VIDES Date of Exam: 06/27/2024 Medical Rec #:  [id number]          Height:       64.0 in Accession #:    [id number]         Weight:       190.7 lb Date of Birth:  November 13, 1965         BSA:          1.917 m Patient Age:    57 years           BP:           129/73 mmHg Patient Gender: F                  HR:           73 bpm. Exam Location:  [facility] Procedure: 2D Echo, Cardiac Doppler and Color Doppler (Both Spectral and Color            Flow Doppler were utilized during procedure). Indications:     Elevated troponin  History:         Patient has prior history of Echocardiogram examinations, most                  recent 11/11/2023. COPD; Risk Factors:Hypertension and Sleep                  Apnea.  Sonographer:     Christopher Furnace Referring Phys:  [id number] DELAYNE LULLA SOLIAN Diagnosing Phys: Deatrice Cage MD  Sonographer  Comments: Technically difficult study due to poor echo windows, no apical window and no subcostal window. Image acquisition challenging due to COPD. IMPRESSIONS  1. Left ventricular ejection fraction, by estimation, is 60 to 65%. The left ventricle has normal function. The left ventricle has no regional wall motion abnormalities. There is mild left ventricular hypertrophy. Left ventricular diastolic function could not be evaluated.  2. Right ventricular systolic function is normal. The right ventricular size is normal. Tricuspid regurgitation signal is inadequate for assessing PA pressure.  3. Left atrial size was mildly dilated.  4. The mitral valve is normal in structure. No evidence of mitral valve regurgitation. No evidence of mitral stenosis.  5. The aortic valve is normal in structure. Aortic valve regurgitation is not visualized. No aortic stenosis is present. FINDINGS  Left Ventricle: Left ventricular ejection fraction, by estimation, is 60 to 65%. The left ventricle has normal function. The left ventricle has no regional wall motion abnormalities. The left ventricular internal cavity size was normal in size. There is  mild left ventricular hypertrophy. Left ventricular diastolic function could not be evaluated. Right Ventricle: The right ventricular size is normal. No increase in right ventricular wall thickness. Right ventricular systolic function is normal. Tricuspid regurgitation signal is inadequate for assessing PA pressure. Left Atrium: Left atrial size was mildly dilated. Right Atrium: Right atrial size was normal in size. Pericardium: There is no evidence of pericardial effusion. Mitral Valve: The mitral valve is normal in structure. No evidence of mitral valve regurgitation. No evidence of mitral valve stenosis. Tricuspid  Valve: The tricuspid valve is normal in structure. Tricuspid valve regurgitation is not demonstrated. No evidence of tricuspid stenosis. Aortic Valve: The aortic valve is normal  in structure. Aortic valve regurgitation is not visualized. No aortic stenosis is present. Pulmonic Valve: The pulmonic valve was normal in structure. Pulmonic valve regurgitation is not visualized. No evidence of pulmonic stenosis. Aorta: The aortic root is normal in size and structure. Venous: The inferior vena cava was not well visualized. IAS/Shunts: No atrial level shunt detected by color flow Doppler.  LEFT VENTRICLE PLAX 2D LVIDd:         3.55 cm LVIDs:         2.13 cm LV PW:         1.10 cm LV IVS:        1.11 cm LVOT diam:     1.30 cm LVOT Area:     1.33 cm  LEFT ATRIUM         Index LA diam:    4.10 cm 2.14 cm/m   AORTA Ao Root diam: 2.50 cm  SHUNTS Systemic Diam: 1.30 cm Deatrice Cage MD Electronically signed by Deatrice Cage MD Signature Date/Time: 06/27/2024/11:24:36 AM    Final    CT Angio Chest PE W and/or Wo Contrast Result Date: 06/24/2024 EXAM: LUNG CANCER SCREENING CHEST CT WITHOUT AND WITH CONTRAST 06/24/2024 09:31:03 PM TECHNIQUE: Low-dose CT of the chest was performed without and with the administration of 75 mL iohexol  (OMNIPAQUE ) 350 MG/ML injection. Multiplanar reformatted images are provided for review. Automated exposure control, iterative reconstruction, and/or weight based adjustment of the mA/kV was utilized to reduce the radiation dose to as low as reasonably achievable. COMPARISON: None available. CLINICAL HISTORY: Pulmonary embolism (PE) suspected, high prob. FINDINGS: MEDIASTINUM: Chest port catheter tip is seen. The left ventricle is visualized. The central airways are clear. LYMPH NODES: No mediastinal, hilar or axillary lymphadenopathy. LUNGS AND PLEURA: No focal consolidation or pulmonary edema. No pleural effusion or pneumothorax. No suspicious pulmonary nodules. SOFT TISSUES AND BONES: There is a lobulated soft tissue density measuring 2.3 x 2.4 cm in the right breast 3. No acute abnormality of the bones. UPPER ABDOMEN: Limited images of the upper abdomen demonstrate  cholecystectomy clips. No other acute abnormality is seen in the limited upper abdomen. IMPRESSION: 1. No evidence of pulmonary embolism. 2. Right breast lobulated soft tissue density measuring 2.3 x 2.4 cm; clinical correlation recommended. Electronically signed by: Greig Pique MD 06/24/2024 09:54 PM EST RP Workstation: [id number]   DG Chest 1 View Result Date: 06/24/2024 EXAM: 1 VIEW(S) XRAY OF THE CHEST 06/24/2024 06:53:26 PM COMPARISON: 06/22/2024 CLINICAL HISTORY: Chest pain FINDINGS: LINES, TUBES AND DEVICES: Stable left chest Port-A-Cath with tip in low right atrium. LUNGS AND PLEURA: Persistent low lung volumes. Azygous lobe. Mild right hemidiaphragm elevation. No focal pulmonary opacity. No pulmonary edema. No pleural effusion. No pneumothorax. HEART AND MEDIASTINUM: Borderline cardiomegaly. No acute abnormality of the mediastinal silhouette. BONES AND SOFT TISSUES: No acute osseous abnormality. IMPRESSION: 1. No acute cardiopulmonary process. 2. Borderline cardiomegaly. 3. Left port-a-cath with tip at low right atrium. Electronically signed by: Rockey Kilts MD 06/24/2024 07:39 PM EST RP Workstation: [id number]   NM Cardiac Muga Rest Result Date: 06/24/2024 EXAM: MUGA SCAN 06/24/2024 10:23:04 AM TECHNIQUE: RADIOPHARMACEUTICAL: The patient's erythrocytes were labeled with 22.19 mCi of 99 mTc. Following injection, scintigraphy with ECG gating was acquired. COMPARISON: None available. CLINICAL HISTORY: Chemotherapy patient, assess LV function. FINDINGS: The cardiac chambers and great vessels appear grossly normal in  size and configuration. Right ventricular contractility is subjectively normal. Left ventricle ejection fraction is 57.4%. IMPRESSION: 1. Left ventricular ejection fraction is 57.4%. Electronically signed by: Norleen Boxer MD 06/24/2024 04:30 PM EST RP Workstation: [id number]   DG Chest 1 View Result Date: 06/22/2024 EXAM: 1 VIEW(S) XRAY OF THE CHEST 06/22/2024 10:06:00 AM COMPARISON:  10/29/2023 CLINICAL HISTORY: Pneumothorax FINDINGS: LINES, TUBES AND DEVICES: Left chest power port in place with tip terminating over the right atrium. LUNGS AND PLEURA: Low lung volumes. Right upper lobe azygos lobe, normal variant. No focal pulmonary opacity. No pulmonary edema. No pleural effusion. No pneumothorax. HEART AND MEDIASTINUM: No acute abnormality of the cardiac and mediastinal silhouettes. BONES AND SOFT TISSUES: No acute osseous abnormality. IMPRESSION: 1. No acute cardiopulmonary process. 2. Low lung volumes,  left chest power port in place. Electronically signed by: Helayne Hurst MD 06/22/2024 10:15 AM EST RP Workstation: [id number]   DG C-Arm 1-60 Min-No Report Result Date: 06/22/2024 Fluoroscopy was utilized by the requesting physician.  No radiographic interpretation.    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  After lengthy discussion with the patient, she agreed to proceed with neoadjuvant chemotherapy with Adriamycin  and Cytoxan  x 4 with GCF support followed by weekly Taxol x 12.  This will then be followed by lumpectomy and adjuvant XRT.  After completion of all her treatments, she will benefit from 5 years of letrozole given the ER positivity.  MUGA scan from June 24, 2024 revealed an EF of 57%.  Patient has had port placement.  Patient tolerated cycle 1 of Adriamycin  and Floxin without significant side effects.  Return to clinic in 1 week for consideration of cycle 2 and then in 3 weeks for further evaluation and consideration of cycle 3.   Chest pain/palpitations: Resolved.  Patient was previously given a referral to cardiology.   Hyponatremia: Mild, monitor. Hypokalemia: Patient's potassium level decreased to 3.0.  Consider oral potassium  supplementation if remains persistent. Anemia: Hemoglobin has trended down to 9.9, monitor.  The entire visit was done in the presence of an ASL interpreter.  Patient expressed understanding and was in agreement with this plan. She also understands that She can call clinic at any time with any questions, concerns, or complaints.    Cancer Staging  Invasive ductal carcinoma of right breast [facility]) Staging form: Breast, AJCC 8th Edition - Clinical stage from 06/16/2024: Stage IIB (cT2, cN0, cM0, G3, ER+, PR-, HER2-) - Signed by Jacobo Evalene PARAS, MD on 06/16/2024 Stage prefix: Initial diagnosis Histologic grading system: 3 grade system   Evalene PARAS Jacobo, MD   07/08/2024 10:26 AM

## 2024-07-11 ENCOUNTER — Ambulatory Visit: Attending: Cardiology | Admitting: Cardiology

## 2024-07-11 VITALS — BP 108/72 | HR 72 | Ht 64.0 in | Wt 191.4 lb

## 2024-07-11 DIAGNOSIS — R002 Palpitations: Secondary | ICD-10-CM | POA: Insufficient documentation

## 2024-07-11 DIAGNOSIS — Z01810 Encounter for preprocedural cardiovascular examination: Secondary | ICD-10-CM | POA: Diagnosis not present

## 2024-07-11 DIAGNOSIS — E876 Hypokalemia: Secondary | ICD-10-CM | POA: Diagnosis not present

## 2024-07-11 DIAGNOSIS — E7800 Pure hypercholesterolemia, unspecified: Secondary | ICD-10-CM | POA: Insufficient documentation

## 2024-07-11 DIAGNOSIS — I10 Essential (primary) hypertension: Secondary | ICD-10-CM | POA: Insufficient documentation

## 2024-07-11 MED ORDER — CARVEDILOL 6.25 MG PO TABS: 6.2500 mg | ORAL_TABLET | Freq: Two times a day (BID) | ORAL | 3 refills | Status: DC

## 2024-07-11 MED ORDER — VALSARTAN 320 MG PO TABS: 320.0000 mg | ORAL_TABLET | Freq: Every day | ORAL | 3 refills | Status: AC

## 2024-07-11 MED FILL — Fosaprepitant Dimeglumine For IV Infusion 150 MG (Base Eq): INTRAVENOUS | Qty: 5 | Status: AC

## 2024-07-11 NOTE — Patient Instructions (Signed)
 Medication Instructions:  - START valsartan  320 mg daily - STOP valsartan -hydrochlorothiazide   - INCREASE coreg  to 6.25 twice a day   *If you need a refill on your cardiac medications before your next appointment, please call your pharmacy*  Lab Work: Your provider would like for you to return in 2 weeks to have the following labs drawn: BMP (kidney and electrolytes).   Please go to [facility] [address] [address], [place] [address] You do not need an appointment.  They are open from 8 am- 4:30 pm.  Lunch from 1:00 pm- 2:00 pm You do not need to be fasting.  If you have labs (blood work) drawn today and your tests are completely normal, you will receive your results only by: MyChart Message (if you have MyChart) OR A paper copy in the mail If you have any lab test that is abnormal or we need to change your treatment, we will call you to review the results.  Testing/Procedures: No test ordered today   Follow-Up: At [facility], you and your health needs are our priority.  As part of our continuing mission to provide you with exceptional heart care, our providers are all part of one team.  This team includes your primary Cardiologist (physician) and Advanced Practice Providers or APPs (Physician Assistants and Nurse Practitioners) who all work together to provide you with the care you need, when you need it.  Your next appointment:   3 month(s)  Provider:   You may see Dr Darliss or one of the following Advanced Practice Providers on your designated Care Team:   Lonni Meager, NP Lesley Maffucci, PA-C Bernardino Bring, PA-C Cadence Clearfield, PA-C Tylene Lunch, NP Barnie Hila, NP    We recommend signing up for the patient portal called MyChart.  Sign up information is provided on this After Visit Summary.  MyChart is used to connect with patients for Virtual Visits (Telemedicine).  Patients are able to view lab/test results,  encounter notes, upcoming appointments, etc.  Non-urgent messages can be sent to your provider as well.   To learn more about what you can do with MyChart, go to [email or web address].

## 2024-07-11 NOTE — Progress Notes (Unsigned)
  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  INTERVAL HISTORY: Patient returns to clinic today for repeat laboratory work, further evaluation for cycle 2 of neoadjuvant Adriamycin  and Cytoxan .  She currently feels well and is asymptomatic.  She tolerated her treatment well without significant side effects. She has no neurologic complaints.  She denies any recent fevers or illnesses.  She has a good appetite and denies weight loss.  She has no chest pain, shortness of breath, cough, or hemoptysis.  She denies any nausea, vomiting, constipation, or diarrhea.  She has no urinary complaints.  Patient offers no specific complaints today.  REVIEW OF SYSTEMS:   Review of Systems  Constitutional: Negative.  Negative for fever, malaise/fatigue and weight loss.  Respiratory: Negative.  Negative for cough, hemoptysis and shortness of breath.   Cardiovascular: Negative.  Negative for chest pain and leg swelling.  Gastrointestinal: Negative.  Negative for abdominal pain.  Genitourinary: Negative.  Negative for dysuria.  Musculoskeletal: Negative.  Negative for back pain.  Skin: Negative.  Negative for rash.  Neurological: Negative.  Negative for dizziness, focal weakness and weakness.  Psychiatric/Behavioral: Negative.  The patient is not nervous/anxious.     As per HPI. Otherwise, a complete review of systems is negative.  PAST MEDICAL HISTORY: Past Medical History:  Diagnosis Date   Asthma    Cancer (HCC)    COPD (chronic obstructive pulmonary disease) (HCC)    Deaf    GERD (gastroesophageal reflux disease)     Hypertension 2011   NO (nasal obstruction)    OSA (obstructive sleep apnea)     PAST SURGICAL HISTORY: Past Surgical History:  Procedure Laterality Date   BREAST BIOPSY Right 06/08/2024   US  RT BREAST BX W LOC DEV 1ST LESION IMG BX SPEC US  GUIDE 06/08/2024 [facility]   CESAREAN SECTION     x 2   CHOLECYSTECTOMY     DENTAL SURGERY Bilateral 04/2024   Removed both fang teeth on the top and wisdom as well.   PORTACATH PLACEMENT Left 06/22/2024   Procedure: INSERTION, TUNNELED CENTRAL VENOUS DEVICE, WITH PORT;  Surgeon: Marinda Jayson KIDD, MD;  Location: [facility];  Service: General;  Laterality: Left;   TUBAL LIGATION      FAMILY HISTORY: Family History  Problem Relation Age of Onset   Heart disease Mother    Stroke Mother    Colon cancer Father    Breast cancer Sister        1/2 sister   Brain cancer Brother     ADVANCED DIRECTIVES (Y/N):  N  HEALTH MAINTENANCE: Social History   Tobacco Use   Smoking status: Never   Smokeless tobacco: Never  Vaping Use   Vaping status: Never Used  Substance Use Topics   Alcohol use: Not Currently    Alcohol/week: 1.0 standard drink of alcohol    Types: 1 Glasses of wine per week    Comment: occassionally    Drug use: Not Currently    Types: Crack cocaine     Colonoscopy:  PAP:  Bone density:  Lipid panel:  Allergies  Allergen Reactions   Egg Protein-Containing Drug Products Nausea And Vomiting  Milk-Related Compounds     Current Outpatient Medications  Medication Sig Dispense Refill   albuterol  (VENTOLIN  HFA) 108 (90 Base) MCG/ACT inhaler Inhale 2 puffs into the lungs every 6 (six) hours as needed for wheezing or shortness of breath. 18 g 6   amLODipine  (NORVASC ) 10 MG tablet TAKE 1 TABLET BY MOUTH ONCE DAILY. TO LOWER BLOOD PRESSURE 90 tablet 3   atorvastatin  (LIPITOR) 20 MG tablet Take 1 tablet (20 mg total) by mouth daily. 90 tablet 1   Blood Pressure Monitor KIT Use to check blood pressure daily 1 kit 0    budesonide -formoterol  (SYMBICORT ) 80-4.5 MCG/ACT inhaler Inhale 2 puffs into the lungs in the morning and at bedtime. 1 each 2   carvedilol  (COREG ) 6.25 MG tablet Take 1 tablet (6.25 mg total) by mouth 2 (two) times daily with a meal. 90 tablet 3   levocetirizine (XYZAL ) 5 MG tablet Take 1 tablet (5 mg total) by mouth every evening. 90 tablet 3   lidocaine -prilocaine  (EMLA ) cream Apply to affected area once 30 g 3   Misc. Devices MISC CPAP therapy on 17 cm H2O . Patient to use X-Small size Resmed Full Face Mask AirFit F10. Diagnosis - Obstructive sleep apnea 1 each 0   nitroGLYCERIN  (NITROSTAT ) 0.4 MG SL tablet Place 1 tablet (0.4 mg total) under the tongue every 5 (five) minutes as needed for chest pain. 20 tablet 1   omeprazole  (PRILOSEC  OTC) 20 MG tablet Take 1 tablet (20 mg total) by mouth daily. 28 tablet 1   omeprazole  (PRILOSEC ) 20 MG capsule Take 20 mg by mouth daily.     ondansetron  (ZOFRAN ) 8 MG tablet Take 1 tablet (8 mg total) by mouth every 8 (eight) hours as needed for nausea or vomiting. 30 tablet 3   prochlorperazine  (COMPAZINE ) 10 MG tablet Take 1 tablet (10 mg total) by mouth every 6 (six) hours as needed for nausea or vomiting. 30 tablet 3   valsartan  (DIOVAN ) 320 MG tablet Take 1 tablet (320 mg total) by mouth daily. 90 tablet 3   Vitamin D , Ergocalciferol , (DRISDOL ) 1.25 MG (50000 UNIT) CAPS capsule Take 1 capsule (50,000 Units total) by mouth every 7 (seven) days. 12 capsule 1   No current facility-administered medications for this visit.   Facility-Administered Medications Ordered in Other Visits  Medication Dose Route Frequency Provider Last Rate Last Admin   0.9 %  sodium chloride  infusion   Intravenous Continuous Jacobo Evalene PARAS, MD   Stopped at 07/12/24 1242    OBJECTIVE: Vitals:   07/12/24 1032  BP: 137/74  Pulse: 73  Resp: 18  Temp: 98 F (36.7 C)  SpO2: 100%      Body mass index is 32.13 kg/m.    ECOG FS:0 - Asymptomatic  General: Well-developed,  well-nourished, no acute distress. Eyes: Pink conjunctiva, anicteric sclera. HEENT: Normocephalic, moist mucous membranes. Lungs: No audible wheezing or coughing. Heart: Regular rate and rhythm. Abdomen: Soft, nontender, no obvious distention. Musculoskeletal: No edema, cyanosis, or clubbing. Neuro: Alert, answering all questions appropriately. Cranial nerves grossly intact. Skin: No rashes or petechiae noted. Psych: Normal affect.  LAB RESULTS:  Lab Results  Component Value Date   NA 137 07/12/2024   K 3.6 07/12/2024   CL 102 07/12/2024   CO2 26 07/12/2024   GLUCOSE 102 (H) 07/12/2024   BUN 12 07/12/2024   CREATININE 0.75 07/12/2024   CALCIUM  8.8 (L) 07/12/2024   PROT 7.3 07/12/2024   ALBUMIN 4.0 07/12/2024   AST 19 07/12/2024  ALT 15 07/12/2024   ALKPHOS 97 07/12/2024   BILITOT 0.8 07/12/2024   GFRNONAA >60 07/12/2024   GFRAA 82 01/05/2020    Lab Results  Component Value Date   WBC 12.4 (H) 07/12/2024   NEUTROABS 8.4 (H) 07/12/2024   HGB 9.8 (L) 07/12/2024   HCT 29.5 (L) 07/12/2024   MCV 87.8 07/12/2024   PLT 215 07/12/2024     STUDIES: ECHOCARDIOGRAM COMPLETE Result Date: 06/27/2024    ECHOCARDIOGRAM REPORT   Patient Name:   MARLON VONRUDEN Date of Exam: 06/27/2024 Medical Rec #:  [id number]          Height:       64.0 in Accession #:    [id number]         Weight:       190.7 lb Date of Birth:  03-13-1966         BSA:          1.917 m Patient Age:    57 years           BP:           129/73 mmHg Patient Gender: F                  HR:           73 bpm. Exam Location:  [facility] Procedure: 2D Echo, Cardiac Doppler and Color Doppler (Both Spectral and Color            Flow Doppler were utilized during procedure). Indications:     Elevated troponin  History:         Patient has prior history of Echocardiogram examinations, most                  recent 11/11/2023. COPD; Risk Factors:Hypertension and Sleep                  Apnea.  Sonographer:     Christopher Furnace Referring Phys:   [id number] DELAYNE LULLA SOLIAN Diagnosing Phys: Deatrice Cage MD  Sonographer Comments: Technically difficult study due to poor echo windows, no apical window and no subcostal window. Image acquisition challenging due to COPD. IMPRESSIONS  1. Left ventricular ejection fraction, by estimation, is 60 to 65%. The left ventricle has normal function. The left ventricle has no regional wall motion abnormalities. There is mild left ventricular hypertrophy. Left ventricular diastolic function could not be evaluated.  2. Right ventricular systolic function is normal. The right ventricular size is normal. Tricuspid regurgitation signal is inadequate for assessing PA pressure.  3. Left atrial size was mildly dilated.  4. The mitral valve is normal in structure. No evidence of mitral valve regurgitation. No evidence of mitral stenosis.  5. The aortic valve is normal in structure. Aortic valve regurgitation is not visualized. No aortic stenosis is present. FINDINGS  Left Ventricle: Left ventricular ejection fraction, by estimation, is 60 to 65%. The left ventricle has normal function. The left ventricle has no regional wall motion abnormalities. The left ventricular internal cavity size was normal in size. There is  mild left ventricular hypertrophy. Left ventricular diastolic function could not be evaluated. Right Ventricle: The right ventricular size is normal. No increase in right ventricular wall thickness. Right ventricular systolic function is normal. Tricuspid regurgitation signal is inadequate for assessing PA pressure. Left Atrium: Left atrial size was mildly dilated. Right Atrium: Right atrial size was normal in size. Pericardium: There is no evidence of pericardial effusion. Mitral Valve: The mitral valve  is normal in structure. No evidence of mitral valve regurgitation. No evidence of mitral valve stenosis. Tricuspid Valve: The tricuspid valve is normal in structure. Tricuspid valve regurgitation is not demonstrated. No  evidence of tricuspid stenosis. Aortic Valve: The aortic valve is normal in structure. Aortic valve regurgitation is not visualized. No aortic stenosis is present. Pulmonic Valve: The pulmonic valve was normal in structure. Pulmonic valve regurgitation is not visualized. No evidence of pulmonic stenosis. Aorta: The aortic root is normal in size and structure. Venous: The inferior vena cava was not well visualized. IAS/Shunts: No atrial level shunt detected by color flow Doppler.  LEFT VENTRICLE PLAX 2D LVIDd:         3.55 cm LVIDs:         2.13 cm LV PW:         1.10 cm LV IVS:        1.11 cm LVOT diam:     1.30 cm LVOT Area:     1.33 cm  LEFT ATRIUM         Index LA diam:    4.10 cm 2.14 cm/m   AORTA Ao Root diam: 2.50 cm  SHUNTS Systemic Diam: 1.30 cm Deatrice Cage MD Electronically signed by Deatrice Cage MD Signature Date/Time: 06/27/2024/11:24:36 AM    Final    CT Angio Chest PE W and/or Wo Contrast Result Date: 06/24/2024 EXAM: LUNG CANCER SCREENING CHEST CT WITHOUT AND WITH CONTRAST 06/24/2024 09:31:03 PM TECHNIQUE: Low-dose CT of the chest was performed without and with the administration of 75 mL iohexol  (OMNIPAQUE ) 350 MG/ML injection. Multiplanar reformatted images are provided for review. Automated exposure control, iterative reconstruction, and/or weight based adjustment of the mA/kV was utilized to reduce the radiation dose to as low as reasonably achievable. COMPARISON: None available. CLINICAL HISTORY: Pulmonary embolism (PE) suspected, high prob. FINDINGS: MEDIASTINUM: Chest port catheter tip is seen. The left ventricle is visualized. The central airways are clear. LYMPH NODES: No mediastinal, hilar or axillary lymphadenopathy. LUNGS AND PLEURA: No focal consolidation or pulmonary edema. No pleural effusion or pneumothorax. No suspicious pulmonary nodules. SOFT TISSUES AND BONES: There is a lobulated soft tissue density measuring 2.3 x 2.4 cm in the right breast 3. No acute abnormality of the  bones. UPPER ABDOMEN: Limited images of the upper abdomen demonstrate cholecystectomy clips. No other acute abnormality is seen in the limited upper abdomen. IMPRESSION: 1. No evidence of pulmonary embolism. 2. Right breast lobulated soft tissue density measuring 2.3 x 2.4 cm; clinical correlation recommended. Electronically signed by: Greig Pique MD 06/24/2024 09:54 PM EST RP Workstation: [id number]   DG Chest 1 View Result Date: 06/24/2024 EXAM: 1 VIEW(S) XRAY OF THE CHEST 06/24/2024 06:53:26 PM COMPARISON: 06/22/2024 CLINICAL HISTORY: Chest pain FINDINGS: LINES, TUBES AND DEVICES: Stable left chest Port-A-Cath with tip in low right atrium. LUNGS AND PLEURA: Persistent low lung volumes. Azygous lobe. Mild right hemidiaphragm elevation. No focal pulmonary opacity. No pulmonary edema. No pleural effusion. No pneumothorax. HEART AND MEDIASTINUM: Borderline cardiomegaly. No acute abnormality of the mediastinal silhouette. BONES AND SOFT TISSUES: No acute osseous abnormality. IMPRESSION: 1. No acute cardiopulmonary process. 2. Borderline cardiomegaly. 3. Left port-a-cath with tip at low right atrium. Electronically signed by: Rockey Kilts MD 06/24/2024 07:39 PM EST RP Workstation: [id number]   NM Cardiac Muga Rest Result Date: 06/24/2024 EXAM: MUGA SCAN 06/24/2024 10:23:04 AM TECHNIQUE: RADIOPHARMACEUTICAL: The patient's erythrocytes were labeled with 22.19 mCi of 99 mTc. Following injection, scintigraphy with ECG gating was acquired. COMPARISON: None available. CLINICAL  HISTORY: Chemotherapy patient, assess LV function. FINDINGS: The cardiac chambers and great vessels appear grossly normal in size and configuration. Right ventricular contractility is subjectively normal. Left ventricle ejection fraction is 57.4%. IMPRESSION: 1. Left ventricular ejection fraction is 57.4%. Electronically signed by: Norleen Boxer MD 06/24/2024 04:30 PM EST RP Workstation: [id number]   DG Chest 1 View Result Date:  06/22/2024 EXAM: 1 VIEW(S) XRAY OF THE CHEST 06/22/2024 10:06:00 AM COMPARISON: 10/29/2023 CLINICAL HISTORY: Pneumothorax FINDINGS: LINES, TUBES AND DEVICES: Left chest power port in place with tip terminating over the right atrium. LUNGS AND PLEURA: Low lung volumes. Right upper lobe azygos lobe, normal variant. No focal pulmonary opacity. No pulmonary edema. No pleural effusion. No pneumothorax. HEART AND MEDIASTINUM: No acute abnormality of the cardiac and mediastinal silhouettes. BONES AND SOFT TISSUES: No acute osseous abnormality. IMPRESSION: 1. No acute cardiopulmonary process. 2. Low lung volumes,  left chest power port in place. Electronically signed by: Helayne Hurst MD 06/22/2024 10:15 AM EST RP Workstation: [id number]   DG C-Arm 1-60 Min-No Report Result Date: 06/22/2024 Fluoroscopy was utilized by the requesting physician.  No radiographic interpretation.    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  After lengthy discussion with the patient, she agreed to proceed with neoadjuvant chemotherapy with Adriamycin  and Cytoxan  x 4 with GCF support followed by weekly Taxol x 12.  This will then be followed by lumpectomy and adjuvant XRT.  After completion of all her treatments, she will benefit from 5 years of letrozole given the ER positivity.  MUGA scan from June 24, 2024 revealed an EF of 57%.  Patient has had port placement.  Patient tolerated cycle 1 of Adriamycin  and Floxin without significant side effects.  CBC and CMP stable to Proceed with cycle 2 today.  Patient tolerated cycle 1 well without significant side effects   Chest pain/palpitations: Resolved.  Patient was previously given a referral to cardiology.   Hyponatremia: resolved Na 137 today will continue to  monitor Hypokalemia: stable today K 3.6 continue to monitor Anemia: Hemoglobin has trended down to 9.8, monitor. Leukocytosis: secondary to GCSF wbc at 12.4 to be expected denies fever/chills no s/sx of infection   The entire visit was done with the help of her Daughter consent signed  Patient expressed understanding and was in agreement with this plan. She also understands that She can call clinic at any time with any questions, concerns, or complaints.    Cancer Staging  Invasive ductal carcinoma of right breast [facility]) Staging form: Breast, AJCC 8th Edition - Clinical stage from 06/16/2024: Stage IIB (cT2, cN0, cM0, G3, ER+, PR-, HER2-) - Signed by Jacobo Evalene PARAS, MD on 06/16/2024 Stage prefix: Initial diagnosis Histologic grading system: 3 grade system   Morna Husband, NP   07/12/2024 2:19 PM

## 2024-07-11 NOTE — Progress Notes (Signed)
 Cardiology Office Note:    Date:  07/11/2024   ID:  Sadi Arave, DOB 12-06-1965, MRN [id number]  PCP:  Donzella Lauraine SAILOR, DO   [facility] Providers Cardiologist:  None     Referring MD: Jacobo Evalene PARAS, MD   Chief Complaint  Patient presents with   Pre-op Exam    No issues. Sometimes having fast heart beat.     History of Present Illness:    Koni Kannan is a 58 y.o. female with a hx of hypertension, hyperlipidemia, right breast cancer on chemo, deafness presenting for preprocedural exam.  Evaluated in the ED 2 weeks ago with symptoms of chest pain.  Minimal troponin on admission of 31, 28.  Echo revealed normal EF 60 to 65%.  D&C being planned.  Recently diagnosed with right breast cancer, on chemotherapy.  Evaluated by myself about 6 months ago with symptoms of palpitations, cardiac monitor was unrevealing.  Symptoms of chest pain was atypical, with complaints of GERD.  Takes omeprazole  which has significantly helped her symptoms of reflux.  Started on carvedilol  that has helped her symptoms of palpitations.  Prior notes/testing Echo 11/25 EF 60 to 65% Cardiac monitor 3/25 no A-fib or flutter  Past Medical History:  Diagnosis Date   Asthma    Cancer (HCC)    COPD (chronic obstructive pulmonary disease) (HCC)    Deaf    GERD (gastroesophageal reflux disease)    Hypertension 2011   NO (nasal obstruction)    OSA (obstructive sleep apnea)     Past Surgical History:  Procedure Laterality Date   BREAST BIOPSY Right 06/08/2024   US  RT BREAST BX W LOC DEV 1ST LESION IMG BX SPEC US  GUIDE 06/08/2024 [facility]   CESAREAN SECTION     x 2   CHOLECYSTECTOMY     DENTAL SURGERY Bilateral 04/2024   Removed both fang teeth on the top and wisdom as well.   PORTACATH PLACEMENT Left 06/22/2024   Procedure: INSERTION, TUNNELED CENTRAL VENOUS DEVICE, WITH PORT;  Surgeon: Marinda Jayson KIDD, MD;  Location: [facility];  Service: General;  Laterality: Left;    TUBAL LIGATION      Current Medications: Current Meds  Medication Sig   albuterol  (VENTOLIN  HFA) 108 (90 Base) MCG/ACT inhaler Inhale 2 puffs into the lungs every 6 (six) hours as needed for wheezing or shortness of breath.   amLODipine  (NORVASC ) 10 MG tablet TAKE 1 TABLET BY MOUTH ONCE DAILY. TO LOWER BLOOD PRESSURE   atorvastatin  (LIPITOR) 20 MG tablet Take 1 tablet (20 mg total) by mouth daily.   Blood Pressure Monitor KIT Use to check blood pressure daily   budesonide -formoterol  (SYMBICORT ) 80-4.5 MCG/ACT inhaler Inhale 2 puffs into the lungs in the morning and at bedtime.   famotidine  (PEPCID ) 20 MG tablet Take 1 tablet (20 mg total) by mouth daily before breakfast.   levocetirizine (XYZAL ) 5 MG tablet Take 1 tablet (5 mg total) by mouth every evening.   lidocaine -prilocaine  (EMLA ) cream Apply to affected area once   Misc. Devices MISC CPAP therapy on 17 cm H2O . Patient to use X-Small size Resmed Full Face Mask AirFit F10. Diagnosis - Obstructive sleep apnea   nitroGLYCERIN  (NITROSTAT ) 0.4 MG SL tablet Place 1 tablet (0.4 mg total) under the tongue every 5 (five) minutes as needed for chest pain.   omeprazole  (PRILOSEC  OTC) 20 MG tablet Take 1 tablet (20 mg total) by mouth daily.   omeprazole  (PRILOSEC ) 20 MG capsule Take 20 mg by  mouth daily.   ondansetron  (ZOFRAN ) 8 MG tablet Take 1 tablet (8 mg total) by mouth every 8 (eight) hours as needed for nausea or vomiting.   prochlorperazine  (COMPAZINE ) 10 MG tablet Take 1 tablet (10 mg total) by mouth every 6 (six) hours as needed for nausea or vomiting.   valsartan  (DIOVAN ) 320 MG tablet Take 1 tablet (320 mg total) by mouth daily.   Vitamin D , Ergocalciferol , (DRISDOL ) 1.25 MG (50000 UNIT) CAPS capsule Take 1 capsule (50,000 Units total) by mouth every 7 (seven) days.   [DISCONTINUED] carvedilol  (COREG ) 3.125 MG tablet Take 1 tablet (3.125 mg total) by mouth 2 (two) times daily with a meal.   [DISCONTINUED] valsartan -hydrochlorothiazide   (DIOVAN -HCT) 320-25 MG tablet Take 1 tablet by mouth daily.     Allergies:   Egg protein-containing drug products and Milk-related compounds   Social History   Socioeconomic History   Marital status: Legally Separated    Spouse name: Not on file   Number of children: Not on file   Years of education: Not on file   Highest education level: 12th grade  Occupational History    Employer: UNEMPLOYED  Tobacco Use   Smoking status: Never   Smokeless tobacco: Never  Vaping Use   Vaping status: Never Used  Substance and Sexual Activity   Alcohol use: Not Currently    Alcohol/week: 1.0 standard drink of alcohol    Types: 1 Glasses of wine per week    Comment: occassionally    Drug use: Not Currently    Types: Crack cocaine   Sexual activity: Yes    Birth control/protection: Surgical  Other Topics Concern   Not on file  Social History Narrative   Lives with daughter   Social Drivers of Health   Financial Resource Strain: Medium Risk (02/17/2024)   Overall Financial Resource Strain (CARDIA)    Difficulty of Paying Living Expenses: Somewhat hard  Food Insecurity: Patient Declined (06/26/2024)   Hunger Vital Sign    Worried About Running Out of Food in the Last Year: Patient declined    Ran Out of Food in the Last Year: Patient declined  Transportation Needs: No Transportation Needs (06/26/2024)   PRAPARE - Administrator, Civil Service (Medical): No    Lack of Transportation (Non-Medical): No  Physical Activity: Insufficiently Active (02/17/2024)   Exercise Vital Sign    Days of Exercise per Week: 1 day    Minutes of Exercise per Session: 20 min  Stress: No Stress Concern Present (02/17/2024)   [employer] of Occupational Health - Occupational Stress Questionnaire    Feeling of Stress: Only a little  Social Connections: Socially Isolated (02/17/2024)   Social Connection and Isolation Panel    Frequency of Communication with Friends and Family: Once a week     Frequency of Social Gatherings with Friends and Family: Once a week    Attends Religious Services: Never    Database Administrator or Organizations: No    Attends Engineer, Structural: Not on file    Marital Status: Separated     Family History: The patient's family history includes Brain cancer in her brother; Breast cancer in her sister; Colon cancer in her father; Heart disease in her mother; Stroke in her mother.  ROS:   Please see the history of present illness.     All other systems reviewed and are negative.  EKGs/Labs/Other Studies Reviewed:    The following studies were reviewed today:  Recent Labs: 06/24/2024: Magnesium  2.2; TSH 2.341 07/08/2024: ALT 15; BUN 12; Creatinine 0.85; Hemoglobin 9.9; Platelets 228; Potassium 3.0; Sodium 134  Recent Lipid Panel    Component Value Date/Time   CHOL 152 02/22/2024 1354   TRIG 117 02/22/2024 1354   HDL 35 (L) 02/22/2024 1354   CHOLHDL 4.3 02/22/2024 1354   CHOLHDL 4.0 03/06/2012 0542   VLDL 7 03/06/2012 0542   LDLCALC 96 02/22/2024 1354     Risk Assessment/Calculations:             Physical Exam:    VS:  BP 108/72 (BP Location: Left Arm, Patient Position: Sitting)   Pulse 72   Ht 5' 4 (1.626 m)   Wt 191 lb 6.4 oz (86.8 kg)   LMP 05/08/2016   SpO2 98%   BMI 32.85 kg/m     Wt Readings from Last 3 Encounters:  07/11/24 191 lb 6.4 oz (86.8 kg)  07/08/24 187 lb (84.8 kg)  07/05/24 189 lb 14.4 oz (86.1 kg)     GEN:  Well nourished, well developed in no acute distress HEENT: Normal NECK: No JVD; No carotid bruits CARDIAC: RRR, no murmurs, rubs, gallops RESPIRATORY:  Clear to auscultation without rales, wheezing or rhonchi  ABDOMEN: Soft, non-tender, non-distended MUSCULOSKELETAL:  No edema; No deformity  SKIN: Warm and dry NEUROLOGIC:  Alert and oriented x 3 PSYCHIATRIC:  Normal affect   ASSESSMENT:    1. Pre-procedural cardiovascular examination   2. Palpitations   3. Primary  hypertension   4. Pure hypercholesterolemia   5. Hypokalemia    PLAN:    In order of problems listed above:  Preprocedural exam, D&C being planned.  Recent echocardiogram normal systolic function.  Procedure deemed low risk from a cardiac perspective.  Okay to proceed with procedure. History of palpitations associated with chest pain which were very atypical.  Recent echo normal, symptoms of chest pain, palpitations improved with Coreg .  Increase Coreg  to 6.25 mg twice daily.  If symptoms persist, will consider coronary CT. Hypertension, hypokalemia.  Stop HCTZ, continue/prescribed valsartan  320 mg daily.  Increase Coreg  to 6.25 mg twice daily, continue Norvasc  10 mg daily. Hyperlipidemia, cholesterol controlled.  Continue Lipitor 20 mg daily. Hypokalemia, stop HCTZ.  Check BMP in 2 weeks.  Follow-up in 3 months   Sign Interpreter used for this visit.     Medication Adjustments/Labs and Tests Ordered: Current medicines are reviewed at length with the patient today.  Concerns regarding medicines are outlined above.  Orders Placed This Encounter  Procedures   Basic Metabolic Panel (BMET)   EKG 12-Lead   Meds ordered this encounter  Medications   carvedilol  (COREG ) 6.25 MG tablet    Sig: Take 1 tablet (6.25 mg total) by mouth 2 (two) times daily with a meal.    Dispense:  90 tablet    Refill:  3   valsartan  (DIOVAN ) 320 MG tablet    Sig: Take 1 tablet (320 mg total) by mouth daily.    Dispense:  90 tablet    Refill:  3    Patient Instructions  Medication Instructions:  - START valsartan  320 mg daily - STOP valsartan -hydrochlorothiazide   - INCREASE coreg  to 6.25 twice a day   *If you need a refill on your cardiac medications before your next appointment, please call your pharmacy*  Lab Work: Your provider would like for you to return in 2 weeks to have the following labs drawn: BMP (kidney and electrolytes).   Please go to [facility]  [place] [address]  ([facility] [address], [place] [address] You do not need an appointment.  They are open from 8 am- 4:30 pm.  Lunch from 1:00 pm- 2:00 pm You do not need to be fasting.  If you have labs (blood work) drawn today and your tests are completely normal, you will receive your results only by: MyChart Message (if you have MyChart) OR A paper copy in the mail If you have any lab test that is abnormal or we need to change your treatment, we will call you to review the results.  Testing/Procedures: No test ordered today   Follow-Up: At [facility], you and your health needs are our priority.  As part of our continuing mission to provide you with exceptional heart care, our providers are all part of one team.  This team includes your primary Cardiologist (physician) and Advanced Practice Providers or APPs (Physician Assistants and Nurse Practitioners) who all work together to provide you with the care you need, when you need it.  Your next appointment:   3 month(s)  Provider:   You may see Dr Darliss or one of the following Advanced Practice Providers on your designated Care Team:   Lonni Meager, NP Lesley Maffucci, PA-C Bernardino Bring, PA-C Cadence [place], PA-C Tylene Lunch, NP Barnie Hila, NP    We recommend signing up for the patient portal called MyChart.  Sign up information is provided on this After Visit Summary.  MyChart is used to connect with patients for Virtual Visits (Telemedicine).  Patients are able to view lab/test results, encounter notes, upcoming appointments, etc.  Non-urgent messages can be sent to your provider as well.   To learn more about what you can do with MyChart, go to [email or web address].             Signed, Redell Darliss, MD  07/11/2024 11:24 AM    [facility]

## 2024-07-12 ENCOUNTER — Inpatient Hospital Stay: Admitting: Nurse Practitioner

## 2024-07-12 ENCOUNTER — Inpatient Hospital Stay

## 2024-07-12 ENCOUNTER — Inpatient Hospital Stay: Admitting: Oncology

## 2024-07-12 ENCOUNTER — Encounter: Payer: Self-pay | Admitting: Nurse Practitioner

## 2024-07-12 VITALS — BP 137/74 | HR 73 | Temp 98.0°F | Resp 18 | Ht 64.0 in | Wt 187.2 lb

## 2024-07-12 VITALS — BP 130/74 | HR 73 | Temp 96.1°F | Resp 15

## 2024-07-12 DIAGNOSIS — Z170 Estrogen receptor positive status [ER+]: Secondary | ICD-10-CM

## 2024-07-12 DIAGNOSIS — Z5111 Encounter for antineoplastic chemotherapy: Secondary | ICD-10-CM | POA: Diagnosis not present

## 2024-07-12 DIAGNOSIS — C50411 Malignant neoplasm of upper-outer quadrant of right female breast: Secondary | ICD-10-CM

## 2024-07-12 DIAGNOSIS — C50911 Malignant neoplasm of unspecified site of right female breast: Secondary | ICD-10-CM

## 2024-07-12 LAB — CBC WITH DIFFERENTIAL (CANCER CENTER ONLY)
Abs Immature Granulocytes: 1.5 K/uL — ABNORMAL HIGH (ref 0.00–0.07)
Basophils Absolute: 0 K/uL (ref 0.0–0.1)
Basophils Relative: 0 %
Eosinophils Absolute: 0 K/uL (ref 0.0–0.5)
Eosinophils Relative: 0 %
HCT: 29.5 % — ABNORMAL LOW (ref 36.0–46.0)
Hemoglobin: 9.8 g/dL — ABNORMAL LOW (ref 12.0–15.0)
Immature Granulocytes: 12 %
Lymphocytes Relative: 11 %
Lymphs Abs: 1.4 K/uL (ref 0.7–4.0)
MCH: 29.2 pg (ref 26.0–34.0)
MCHC: 33.2 g/dL (ref 30.0–36.0)
MCV: 87.8 fL (ref 80.0–100.0)
Monocytes Absolute: 1.1 K/uL — ABNORMAL HIGH (ref 0.1–1.0)
Monocytes Relative: 9 %
Neutro Abs: 8.4 K/uL — ABNORMAL HIGH (ref 1.7–7.7)
Neutrophils Relative %: 68 %
Platelet Count: 215 K/uL (ref 150–400)
RBC: 3.36 MIL/uL — ABNORMAL LOW (ref 3.87–5.11)
RDW: 12.6 % (ref 11.5–15.5)
Smear Review: NORMAL
WBC Count: 12.4 K/uL — ABNORMAL HIGH (ref 4.0–10.5)
nRBC: 0.5 % — ABNORMAL HIGH (ref 0.0–0.2)

## 2024-07-12 LAB — CMP (CANCER CENTER ONLY)
ALT: 15 U/L (ref 0–44)
AST: 19 U/L (ref 15–41)
Albumin: 4 g/dL (ref 3.5–5.0)
Alkaline Phosphatase: 97 U/L (ref 38–126)
Anion gap: 9 (ref 5–15)
BUN: 12 mg/dL (ref 6–20)
CO2: 26 mmol/L (ref 22–32)
Calcium: 8.8 mg/dL — ABNORMAL LOW (ref 8.9–10.3)
Chloride: 102 mmol/L (ref 98–111)
Creatinine: 0.75 mg/dL (ref 0.44–1.00)
GFR, Estimated: >60 mL/min
Glucose, Bld: 102 mg/dL — ABNORMAL HIGH (ref 70–99)
Potassium: 3.6 mmol/L (ref 3.5–5.1)
Sodium: 137 mmol/L (ref 135–145)
Total Bilirubin: 0.8 mg/dL (ref 0.0–1.2)
Total Protein: 7.3 g/dL (ref 6.5–8.1)

## 2024-07-12 MED ORDER — PALONOSETRON HCL INJECTION 0.25 MG/5ML
0.2500 mg | Freq: Once | INTRAVENOUS | Status: AC
  Administered 2024-07-12: 0.25 mg via INTRAVENOUS
  Filled 2024-07-12: qty 5

## 2024-07-12 MED ORDER — DOXORUBICIN HCL CHEMO IV INJECTION 2 MG/ML
60.0000 mg/m2 | Freq: Once | INTRAVENOUS | Status: AC
  Administered 2024-07-12: 118 mg via INTRAVENOUS
  Filled 2024-07-12: qty 59

## 2024-07-12 MED ORDER — DEXAMETHASONE SOD PHOSPHATE PF 10 MG/ML IJ SOLN
10.0000 mg | Freq: Once | INTRAMUSCULAR | Status: AC
  Administered 2024-07-12: 10 mg via INTRAVENOUS

## 2024-07-12 MED ORDER — SODIUM CHLORIDE 0.9 % IV SOLN
150.0000 mg | Freq: Once | INTRAVENOUS | Status: AC
  Administered 2024-07-12: 150 mg via INTRAVENOUS
  Filled 2024-07-12: qty 150

## 2024-07-12 MED ORDER — SODIUM CHLORIDE 0.9 % IV SOLN
600.0000 mg/m2 | Freq: Once | INTRAVENOUS | Status: AC
  Administered 2024-07-12: 1180 mg via INTRAVENOUS
  Filled 2024-07-12: qty 59

## 2024-07-12 MED ORDER — SODIUM CHLORIDE 0.9 % IV SOLN
INTRAVENOUS | Status: DC
  Filled 2024-07-12: qty 250

## 2024-07-12 NOTE — Progress Notes (Signed)
 Daughter Bobetta Piety is signing for her today. Consent form signed.  Biopsy sch'd for 12/1 on her uterus/polyp.

## 2024-07-13 ENCOUNTER — Inpatient Hospital Stay

## 2024-07-13 ENCOUNTER — Encounter
Admission: RE | Admit: 2024-07-13 | Discharge: 2024-07-13 | Disposition: A | Discharge status: Home / Self Care | Source: Ambulatory Visit | Attending: Obstetrics | Admitting: Obstetrics

## 2024-07-13 ENCOUNTER — Other Ambulatory Visit: Payer: Self-pay

## 2024-07-13 DIAGNOSIS — Z5111 Encounter for antineoplastic chemotherapy: Secondary | ICD-10-CM | POA: Diagnosis not present

## 2024-07-13 DIAGNOSIS — C50911 Malignant neoplasm of unspecified site of right female breast: Secondary | ICD-10-CM

## 2024-07-13 HISTORY — DX: Polyp of corpus uteri: N84.0

## 2024-07-13 MED ORDER — PEGFILGRASTIM-CBQV 6 MG/0.6ML ~~LOC~~ SOSY
6.0000 mg | PREFILLED_SYRINGE | Freq: Once | SUBCUTANEOUS | Status: AC
  Administered 2024-07-13: 6 mg via SUBCUTANEOUS
  Filled 2024-07-13: qty 0.6

## 2024-07-13 NOTE — Patient Instructions (Addendum)
 Your procedure is scheduled on: MONDAY 07/18/24  Report to the Registration Desk on the 1st floor of the [facility].  To find out your arrival time, please call [phone] between 1PM - 3PM on: FRIDAY 07/15/24 If your arrival time is 6:00 am, do not arrive before that time as the [facility] entrance doors do not open until 6:00 am.  REMEMBER: Instructions that are not followed completely may result in serious medical risk, up to and including death; or upon the discretion of your surgeon and anesthesiologist your surgery may need to be rescheduled.  Do not eat food after midnight the night before surgery.  No gum chewing or hard candies.  You may however, drink WATER up to 2 hours before you are scheduled to arrive for your surgery. Do not drink anything within 2 hours of your scheduled arrival time.  One week prior to surgery: Stop Anti-inflammatories (NSAIDS) such as Advil, Aleve , Ibuprofen, Motrin, Naproxen , Naprosyn  and Aspirin  based products such as Excedrin, Goody's Powder, BC Powder.  Stop ANY OVER THE COUNTER supplements until after surgery.  You may however, continue to take Tylenol  if needed for pain up until the day of surgery.  Continue taking all of your other prescription medications up until the day of surgery.  ON THE DAY OF SURGERY ONLY TAKE THESE MEDICATIONS WITH SIPS OF WATER:  albuterol  (VENTOLIN  HFA)  budesonide -formoterol  (SYMBICORT )  amLODipine  (NORVASC )  atorvastatin  (LIPITOR)  carvedilol  (COREG )  omeprazole  (PRILOSEC )   Use inhalers on the day of surgery and bring to the hospital.  No Alcohol for 24 hours before or after surgery.  No Smoking including e-cigarettes for 24 hours before surgery.  No chewable tobacco products for at least 6 hours before surgery.  No nicotine patches on the day of surgery.  Do not use any recreational drugs for at least a week (preferably 2 weeks) before your surgery.  Please be advised that the combination of  cocaine and anesthesia may have negative outcomes, up to and including death. If you test positive for cocaine, your surgery will be cancelled.  On the morning of surgery brush your teeth with toothpaste and water, you may rinse your mouth with mouthwash if you wish. Do not swallow any toothpaste or mouthwash.  Do not wear jewelry, make-up, hairpins, clips or nail polish.  For welded (permanent) jewelry: bracelets, anklets, waist bands, etc.  Please have this removed prior to surgery.  If it is not removed, there is a chance that hospital personnel will need to cut it off on the day of surgery.  Do not wear lotions, powders, or perfumes.   Do not shave body hair from the neck down 48 hours before surgery.  Contact lenses, hearing aids and dentures may not be worn into surgery.  Do not bring valuables to the hospital. [facility] is not responsible for any missing/lost belongings or valuables.   Notify your doctor if there is any change in your medical condition (cold, fever, infection).  Wear comfortable clothing (specific to your surgery type) to the hospital.  After surgery, you can help prevent lung complications by doing breathing exercises.  Take deep breaths and cough every 1-2 hours. Your doctor may order a device called an Incentive Spirometer to help you take deep breaths. When coughing or sneezing, hold a pillow firmly against your incision with both hands. This is called "splinting." Doing this helps protect your incision. It also decreases belly discomfort.  If you are being discharged the day of  surgery, you will not be allowed to drive home. You will need a responsible individual to drive you home and stay with you for 24 hours after surgery.   If you are taking public transportation, you will need to have a responsible individual with you.  Please call the Pre-admissions Testing Dept. at [phone] if you have any questions about these instructions.  Surgery  Visitation Policy:  Patients having surgery or a procedure may have two visitors.  Children under the age of 59 must have an adult with them who is not the patient.  Merchandiser, Retail to address health-related social needs:  [email or web address]

## 2024-07-14 ENCOUNTER — Other Ambulatory Visit: Payer: Self-pay

## 2024-07-15 ENCOUNTER — Other Ambulatory Visit: Payer: Self-pay

## 2024-07-18 ENCOUNTER — Other Ambulatory Visit: Payer: Self-pay

## 2024-07-18 ENCOUNTER — Ambulatory Visit: Admitting: Anesthesiology

## 2024-07-18 ENCOUNTER — Encounter
Admission: RE | Disposition: A | Discharge status: Home / Self Care | Payer: Self-pay | Source: Home / Self Care | Attending: Obstetrics

## 2024-07-18 ENCOUNTER — Ambulatory Visit
Admission: RE | Admit: 2024-07-18 | Discharge: 2024-07-18 | Disposition: A | Discharge status: Home / Self Care | Attending: Obstetrics | Admitting: Obstetrics

## 2024-07-18 ENCOUNTER — Encounter: Payer: Self-pay | Admitting: Obstetrics

## 2024-07-18 DIAGNOSIS — I10 Essential (primary) hypertension: Secondary | ICD-10-CM | POA: Diagnosis not present

## 2024-07-18 DIAGNOSIS — E782 Mixed hyperlipidemia: Secondary | ICD-10-CM | POA: Diagnosis not present

## 2024-07-18 DIAGNOSIS — G4733 Obstructive sleep apnea (adult) (pediatric): Secondary | ICD-10-CM | POA: Diagnosis not present

## 2024-07-18 DIAGNOSIS — N840 Polyp of corpus uteri: Secondary | ICD-10-CM | POA: Diagnosis not present

## 2024-07-18 DIAGNOSIS — N950 Postmenopausal bleeding: Secondary | ICD-10-CM | POA: Diagnosis present

## 2024-07-18 DIAGNOSIS — D259 Leiomyoma of uterus, unspecified: Secondary | ICD-10-CM | POA: Diagnosis not present

## 2024-07-18 DIAGNOSIS — R9389 Abnormal findings on diagnostic imaging of other specified body structures: Secondary | ICD-10-CM | POA: Diagnosis not present

## 2024-07-18 DIAGNOSIS — J449 Chronic obstructive pulmonary disease, unspecified: Secondary | ICD-10-CM | POA: Diagnosis not present

## 2024-07-18 DIAGNOSIS — N858 Other specified noninflammatory disorders of uterus: Secondary | ICD-10-CM | POA: Diagnosis present

## 2024-07-18 HISTORY — PX: HYSTEROSCOPY WITH D & C: SHX1775

## 2024-07-18 SURGERY — DILATATION AND CURETTAGE /HYSTEROSCOPY
Anesthesia: General

## 2024-07-18 MED ORDER — ONDANSETRON HCL 4 MG/2ML IJ SOLN
INTRAMUSCULAR | Status: DC | PRN
  Administered 2024-07-18: 4 mg via INTRAVENOUS

## 2024-07-18 MED ORDER — MIDAZOLAM HCL 2 MG/2ML IJ SOLN
INTRAMUSCULAR | Status: AC
  Filled 2024-07-18: qty 2

## 2024-07-18 MED ORDER — OXYCODONE HCL 5 MG/5ML PO SOLN: 5.0000 mg | Freq: Once | ORAL | Status: DC | PRN

## 2024-07-18 MED ORDER — ORAL CARE MOUTH RINSE: 15.0000 mL | Freq: Once | OROMUCOSAL | Status: AC

## 2024-07-18 MED ORDER — DEXAMETHASONE SOD PHOSPHATE PF 10 MG/ML IJ SOLN
INTRAMUSCULAR | Status: DC | PRN
  Administered 2024-07-18: 8 mg via INTRAVENOUS

## 2024-07-18 MED ORDER — FENTANYL CITRATE (PF) 100 MCG/2ML IJ SOLN: 25.0000 ug | INTRAMUSCULAR | Status: DC | PRN

## 2024-07-18 MED ORDER — LIDOCAINE HCL (CARDIAC) PF 100 MG/5ML IV SOSY
PREFILLED_SYRINGE | INTRAVENOUS | Status: DC | PRN
  Administered 2024-07-18: 100 mg via INTRAVENOUS

## 2024-07-18 MED ORDER — LACTATED RINGERS IV SOLN: INTRAVENOUS | Status: DC

## 2024-07-18 MED ORDER — OXYCODONE-ACETAMINOPHEN 5-325 MG PO TABS: 1.0000 | ORAL_TABLET | Freq: Three times a day (TID) | ORAL | 0 refills | Status: AC | PRN

## 2024-07-18 MED ORDER — FENTANYL CITRATE (PF) 100 MCG/2ML IJ SOLN
INTRAMUSCULAR | Status: AC
  Filled 2024-07-18: qty 2

## 2024-07-18 MED ORDER — OXYCODONE HCL 5 MG PO TABS: 5.0000 mg | ORAL_TABLET | Freq: Once | ORAL | Status: DC | PRN

## 2024-07-18 MED ORDER — CHLORHEXIDINE GLUCONATE 0.12 % MT SOLN
OROMUCOSAL | Status: AC
  Filled 2024-07-18: qty 15

## 2024-07-18 MED ORDER — 0.9 % SODIUM CHLORIDE (POUR BTL) OPTIME
TOPICAL | Status: DC | PRN
  Administered 2024-07-18: 3000 mL
  Administered 2024-07-18: 2000 mL
  Administered 2024-07-18: 1000 mL

## 2024-07-18 MED ORDER — PROPOFOL 10 MG/ML IV BOLUS
INTRAVENOUS | Status: DC | PRN
  Administered 2024-07-18: 150 mg via INTRAVENOUS
  Administered 2024-07-18: 100 ug/kg/min via INTRAVENOUS

## 2024-07-18 MED ORDER — FENTANYL CITRATE (PF) 100 MCG/2ML IJ SOLN
INTRAMUSCULAR | Status: DC | PRN
  Administered 2024-07-18 (×2): 50 ug via INTRAVENOUS

## 2024-07-18 MED ORDER — CHLORHEXIDINE GLUCONATE 0.12 % MT SOLN
15.0000 mL | Freq: Once | OROMUCOSAL | Status: AC
  Administered 2024-07-18: 15 mL via OROMUCOSAL

## 2024-07-18 MED ORDER — IBUPROFEN 800 MG PO TABS: 800.0000 mg | ORAL_TABLET | Freq: Three times a day (TID) | ORAL | 0 refills | Status: AC | PRN

## 2024-07-18 MED ORDER — MIDAZOLAM HCL (PF) 2 MG/2ML IJ SOLN
INTRAMUSCULAR | Status: DC | PRN
  Administered 2024-07-18: 2 mg via INTRAVENOUS

## 2024-07-18 MED ORDER — PHENYLEPHRINE 80 MCG/ML (10ML) SYRINGE FOR IV PUSH (FOR BLOOD PRESSURE SUPPORT)
PREFILLED_SYRINGE | INTRAVENOUS | Status: DC | PRN
  Administered 2024-07-18: 80 ug via INTRAVENOUS
  Administered 2024-07-18 (×2): 160 ug via INTRAVENOUS

## 2024-07-18 SURGICAL SUPPLY — 23 items
BAG URINE DRAIN 2000ML AR STRL (UROLOGICAL SUPPLIES) IMPLANT
CATH URTH 16FR FL 2W BLN LF (CATHETERS) IMPLANT
DEVICE MYOSURE LITE (MISCELLANEOUS) IMPLANT
DEVICE MYOSURE REACH (MISCELLANEOUS) IMPLANT
DRSG TELFA 3X8 NADH STRL (GAUZE/BANDAGES/DRESSINGS) IMPLANT
ELECTRODE REM PT RTRN 9FT ADLT (ELECTROSURGICAL) ×1 IMPLANT
GLOVE BIO SURGEON STRL SZ 6 (GLOVE) ×1 IMPLANT
GLOVE BIOGEL PI IND STRL 6 (GLOVE) ×1 IMPLANT
GOWN STRL REUS W/ TWL LRG LVL3 (GOWN DISPOSABLE) ×2 IMPLANT
KIT PROCED FLUENT PRO FLT212S (KITS) ×1 IMPLANT
KIT TURNOVER CYSTO (KITS) ×1 IMPLANT
MANIFOLD NEPTUNE II (INSTRUMENTS) ×1 IMPLANT
PACK DNC HYST (MISCELLANEOUS) ×1 IMPLANT
PAD OB MATERNITY 11 LF (PERSONAL CARE ITEMS) ×1 IMPLANT
PAD PREP OB/GYN DISP 24X41 (PERSONAL CARE ITEMS) ×1 IMPLANT
SCRUB CHG 4% DYNA-HEX 4OZ (MISCELLANEOUS) ×1 IMPLANT
SEAL ROD LENS SCOPE MYOSURE (ABLATOR) ×1 IMPLANT
SET CYSTO IRRIGATION (SET/KITS/TRAYS/PACK) IMPLANT
SOL .9 NS 3000ML IRR UROMATIC (IV SOLUTION) ×1 IMPLANT
SOLN STERILE WATER 500 ML (IV SOLUTION) ×1 IMPLANT
SURGILUBE 2OZ TUBE FLIPTOP (MISCELLANEOUS) ×1 IMPLANT
TRAP FLUID SMOKE EVACUATOR (MISCELLANEOUS) ×1 IMPLANT
TUBING CONNECTING 10 (TUBING) ×1 IMPLANT

## 2024-07-18 NOTE — Anesthesia Preprocedure Evaluation (Addendum)
 Anesthesia Evaluation  Patient identified by MRN, date of birth, ID band Patient awake    Reviewed: Allergy & Precautions, NPO status , Patient's Chart, lab work & pertinent test results  History of Anesthesia Complications Negative for: history of anesthetic complications  Airway Mallampati: III  TM Distance: >3 FB Neck ROM: full    Dental  (+) Chipped   Pulmonary shortness of breath and with exertion, asthma , sleep apnea , COPD   Pulmonary exam normal        Cardiovascular Exercise Tolerance: Good hypertension, (-) angina Normal cardiovascular exam     Neuro/Psych negative neurological ROS  negative psych ROS   GI/Hepatic Neg liver ROS,GERD  Controlled,,  Endo/Other  negative endocrine ROS    Renal/GU      Musculoskeletal   Abdominal   Peds  Hematology negative hematology ROS (+)   Anesthesia Other Findings Patient has cardiac clearance for this procedure.   Past Medical History: No date: Asthma No date: Cancer (HCC) No date: COPD (chronic obstructive pulmonary disease) (HCC) No date: Deaf No date: Endometrial polyp No date: GERD (gastroesophageal reflux disease) 2011: Hypertension No date: NO (nasal obstruction) No date: OSA (obstructive sleep apnea)  Past Surgical History: 06/08/2024: BREAST BIOPSY; Right     Comment:  US  RT BREAST BX W LOC DEV 1ST LESION IMG BX SPEC US                GUIDE 06/08/2024 [facility] No date: CESAREAN SECTION     Comment:  x 2 No date: CHOLECYSTECTOMY 04/2024: DENTAL SURGERY; Bilateral     Comment:  Removed both fang teeth on the top and wisdom as well. 06/22/2024: PORTACATH PLACEMENT; Left     Comment:  Procedure: INSERTION, TUNNELED CENTRAL VENOUS DEVICE,               WITH PORT;  Surgeon: Marinda Jayson KIDD, MD;  Location:               [facility] ORS;  Service: General;  Laterality: Left; No date: TUBAL LIGATION     Reproductive/Obstetrics negative OB ROS                               Anesthesia Physical Anesthesia Plan  ASA: 3  Anesthesia Plan: General LMA   Post-op Pain Management:    Induction: Intravenous  PONV Risk Score and Plan: Dexamethasone , Ondansetron , Midazolam  and Treatment may vary due to age or medical condition  Airway Management Planned: LMA  Additional Equipment:   Intra-op Plan:   Post-operative Plan: Extubation in OR  Informed Consent: I have reviewed the patients History and Physical, chart, labs and discussed the procedure including the risks, benefits and alternatives for the proposed anesthesia with the patient or authorized representative who has indicated his/her understanding and acceptance.     Dental Advisory Given and Interpreter used for interview  Plan Discussed with: Anesthesiologist, CRNA and Surgeon  Anesthesia Plan Comments: (Patient consented for risks of anesthesia including but not limited to:  - adverse reactions to medications - damage to eyes, teeth, lips or other oral mucosa - nerve damage due to positioning  - sore throat or hoarseness - Damage to heart, brain, nerves, lungs, other parts of body or loss of life  Patient voiced understanding and assent.)         Anesthesia Quick Evaluation

## 2024-07-18 NOTE — Anesthesia Procedure Notes (Signed)
 Procedure Name: LMA Insertion Date/Time: 07/18/2024 1:50 PM  Performed by: Rosine Shona Jansky, CRNAPre-anesthesia Checklist: Patient identified, Emergency Drugs available, Suction available and Patient being monitored Patient Re-evaluated:Patient Re-evaluated prior to induction Oxygen Delivery Method: Circle system utilized Preoxygenation: Pre-oxygenation with 100% oxygen Induction Type: IV induction Ventilation: Mask ventilation without difficulty LMA: LMA inserted LMA Size: 4.0 Tube type: Oral Number of attempts: 1 Airway Equipment and Method: Oral airway Placement Confirmation: positive ETCO2 and breath sounds checked- equal and bilateral Tube secured with: Tape Dental Injury: Teeth and Oropharynx as per pre-operative assessment

## 2024-07-18 NOTE — Transfer of Care (Signed)
 Immediate Anesthesia Transfer of Care Note  Patient: Caitlin [name]  Procedure(s) Performed: DILATATION AND CURETTAGE /HYSTEROSCOPY  Patient Location: PACU  Anesthesia Type:General  Level of Consciousness: drowsy  Airway & Oxygen Therapy: Patient Spontanous Breathing and Patient connected to face mask oxygen  Post-op Assessment: Report given to RN  Post vital signs: stable  Last Vitals:  Vitals Value Taken Time  BP 121/71 07/18/24 14:47  Temp    Pulse 78 07/18/24 14:49  Resp 16 07/18/24 14:49  SpO2 100 % 07/18/24 14:49  Vitals shown include unfiled device data.  Last Pain:  Vitals:   07/18/24 1259  TempSrc: Temporal  PainSc: 0-No pain         Complications: No notable events documented.

## 2024-07-18 NOTE — Op Note (Signed)
 OPERATIVE NOTE  DATE OF PROCEDURE:   07/18/2024  PRE-OPERATIVE DIAGNOSIS:  1) PMB, thickened endometrium, uterine polyp; known fibroid uterus  POST-OPERATIVE DIAGNOSIS:  Post-Op Diagnosis Codes:    * PMB (postmenopausal bleeding) [N95.0]    * Thickened endometrium [R93.89]    * Uterine polyp [N84.0]    * Uterine mass  OPERATION:  Hysteroscopy D&C w/Myosure uterine mass biopsy  SURGEON(S): Surgeons and Role:    * Leigh Sober, MD - Primary   ANESTHESIA: Choice  DISTENSION MEDIA: In: 6625cc, Out 6350cc, Deficit: 275cc  ESTIMATED BLOOD LOSS: minimal, < 5cc  OPERATIVE FINDINGS: Bulky uterus on bimanual exam; multiple fibroid-appearing masses and polyps throughout uterus  SPECIMEN:  ID Type Source Tests Collected by Time Destination  1 : endometrial curettings Tissue PATH Gyn biopsy SURGICAL PATHOLOGY Leigh Sober, MD 07/18/2024 1405   2 : intra uterine mass Tissue PATH Gyn biopsy SURGICAL PATHOLOGY Leigh Sober, MD 07/18/2024 1425     COMPLICATIONS: None  DRAINS: None  DISPOSITION: Stable to recovery room  DESCRIPTION OF PROCEDURE:      The patient was prepped and draped in the dorsal lithotomy position and placed under general anesthesia. Her bladder was emptied. A speculum was inserted and the cervix was grasped with a tenaculum. Respecting the position and curvature of her cervix, it was dilated to accommodate the hysteroscope. Multiple polyps in the lower segment were encountered, and a large fibroid-appearing mass was seen at the fundus. The Myosure was used to take a biopsy of the fundal mass. The Myosure and hysteroscope were then removed. A systematic curettage was performed in all quadrants until a gritty texture was noted. The hysteroscope was replaced and the endometrium inspected. Polyps were still seen in the left cornua, blocking the left ostia. No other abnormalities were seen. The tenaculum was removed from the cervix and hemostasis was noted. The speculum was removed  and the patient went to recovery room in stable condition.   Sober Leigh, DO  [facility]

## 2024-07-18 NOTE — H&P (Addendum)
 GYNECOLOGY PREOPERATIVE HISTORY AND PHYSICAL   Subjective:  Caitlin [name] is a 58 y.o. [phone] here for surgical management of postmenopausal bleeding.   Indications for procedure include: thickened endometrium and a endometrial polyp in LUS. No significant preoperative concerns.  Proposed surgery: Hysteroscopy, D&C  Pertinent Gynecological History: Menses: post-menopausal Bleeding: post menopausal bleeding Contraception: post menopausal status Last mammogram: abnormal: R breast Date: 06/08/24 Last pap: normal Date: 07/04/20   Past Medical History:  Diagnosis Date   Asthma    Cancer (HCC)    COPD (chronic obstructive pulmonary disease) (HCC)    Deaf    Endometrial polyp    GERD (gastroesophageal reflux disease)    Hypertension 2011   NO (nasal obstruction)    OSA (obstructive sleep apnea)    Past Surgical History:  Procedure Laterality Date   BREAST BIOPSY Right 06/08/2024   US  RT BREAST BX W LOC DEV 1ST LESION IMG BX SPEC US  GUIDE 06/08/2024 [facility]   CESAREAN SECTION     x 2   CHOLECYSTECTOMY     DENTAL SURGERY Bilateral 04/2024   Removed both fang teeth on the top and wisdom as well.   PORTACATH PLACEMENT Left 06/22/2024   Procedure: INSERTION, TUNNELED CENTRAL VENOUS DEVICE, WITH PORT;  Surgeon: Marinda Jayson KIDD, MD;  Location: [facility];  Service: General;  Laterality: Left;   TUBAL LIGATION     OB History  Gravida Para Term Preterm AB Living  3 3 3   3   SAB IAB Ectopic Multiple Live Births          # Outcome Date GA Lbr Len/2nd Weight Sex Type Anes PTL Lv  3 Term 09/02/93    F CS-Unspec     2 Term 01/25/91    M CS-Unspec     1 Term 01/03/90    F Vag-Spont     Patient denies any other pertinent gynecologic issues.  Family History  Problem Relation Age of Onset   Heart disease Mother    Stroke Mother    Colon cancer Father    Breast cancer Sister        1/2 sister   Brain cancer Brother    Social History   Socioeconomic History   Marital  status: Legally Separated    Spouse name: Not on file   Number of children: Not on file   Years of education: Not on file   Highest education level: 12th grade  Occupational History    Employer: UNEMPLOYED  Tobacco Use   Smoking status: Never   Smokeless tobacco: Never  Vaping Use   Vaping status: Never Used  Substance and Sexual Activity   Alcohol use: Not Currently    Alcohol/week: 1.0 standard drink of alcohol    Types: 1 Glasses of wine per week    Comment: occassionally    Drug use: Not Currently    Types: Crack cocaine   Sexual activity: Yes    Birth control/protection: Surgical  Other Topics Concern   Not on file  Social History Narrative   Lives with daughter   Social Drivers of Health   Financial Resource Strain: Medium Risk (02/17/2024)   Overall Financial Resource Strain (CARDIA)    Difficulty of Paying Living Expenses: Somewhat hard  Food Insecurity: Patient Declined (06/26/2024)   Hunger Vital Sign    Worried About Running Out of Food in the Last Year: Patient declined    Ran Out of Food in the Last Year: Patient declined  Transportation Needs: No  Transportation Needs (06/26/2024)   PRAPARE - Administrator, Civil Service (Medical): No    Lack of Transportation (Non-Medical): No  Physical Activity: Insufficiently Active (02/17/2024)   Exercise Vital Sign    Days of Exercise per Week: 1 day    Minutes of Exercise per Session: 20 min  Stress: No Stress Concern Present (02/17/2024)   [employer] of Occupational Health - Occupational Stress Questionnaire    Feeling of Stress: Only a little  Social Connections: Socially Isolated (02/17/2024)   Social Connection and Isolation Panel    Frequency of Communication with Friends and Family: Once a week    Frequency of Social Gatherings with Friends and Family: Once a week    Attends Religious Services: Never    Database Administrator or Organizations: No    Attends Engineer, Structural: Not on  file    Marital Status: Separated  Intimate Partner Violence: Not At Risk (06/26/2024)   Humiliation, Afraid, Rape, and Kick questionnaire    Fear of Current or Ex-Partner: No    Emotionally Abused: No    Physically Abused: No    Sexually Abused: No   No current facility-administered medications on file prior to encounter.   Current Outpatient Medications on File Prior to Encounter  Medication Sig Dispense Refill   albuterol  (VENTOLIN  HFA) 108 (90 Base) MCG/ACT inhaler Inhale 2 puffs into the lungs every 6 (six) hours as needed for wheezing or shortness of breath. 18 g 6   amLODipine  (NORVASC ) 10 MG tablet TAKE 1 TABLET BY MOUTH ONCE DAILY. TO LOWER BLOOD PRESSURE 90 tablet 3   atorvastatin  (LIPITOR) 20 MG tablet Take 1 tablet (20 mg total) by mouth daily. 90 tablet 1   budesonide -formoterol  (SYMBICORT ) 80-4.5 MCG/ACT inhaler Inhale 2 puffs into the lungs in the morning and at bedtime. 1 each 2   levocetirizine (XYZAL ) 5 MG tablet Take 1 tablet (5 mg total) by mouth every evening. 90 tablet 3   omeprazole  (PRILOSEC  OTC) 20 MG tablet Take 1 tablet (20 mg total) by mouth daily. 28 tablet 1   ondansetron  (ZOFRAN ) 8 MG tablet Take 1 tablet (8 mg total) by mouth every 8 (eight) hours as needed for nausea or vomiting. 30 tablet 3   prochlorperazine  (COMPAZINE ) 10 MG tablet Take 1 tablet (10 mg total) by mouth every 6 (six) hours as needed for nausea or vomiting. 30 tablet 3   Vitamin D , Ergocalciferol , (DRISDOL ) 1.25 MG (50000 UNIT) CAPS capsule Take 1 capsule (50,000 Units total) by mouth every 7 (seven) days. 12 capsule 1   Blood Pressure Monitor KIT Use to check blood pressure daily 1 kit 0   lidocaine -prilocaine  (EMLA ) cream Apply to affected area once 30 g 3   Misc. Devices MISC CPAP therapy on 17 cm H2O . Patient to use X-Small size Resmed Full Face Mask AirFit F10. Diagnosis - Obstructive sleep apnea 1 each 0   nitroGLYCERIN  (NITROSTAT ) 0.4 MG SL tablet Place 1 tablet (0.4 mg total) under  the tongue every 5 (five) minutes as needed for chest pain. 20 tablet 1   omeprazole  (PRILOSEC ) 20 MG capsule Take 20 mg by mouth daily.     Allergies  Allergen Reactions   Egg Protein-Containing Drug Products Nausea And Vomiting   Milk-Related Compounds       Review of Systems Constitutional: No recent fever/chills/sweats Respiratory: No recent cough/bronchitis Cardiovascular: No chest pain Gastrointestinal: No recent nausea/vomiting/diarrhea Genitourinary: No UTI symptoms Hematologic/lymphatic:No history of coagulopathy or recent  blood thinner use    Objective:   Blood pressure 136/76, pulse 74, temperature (!) 96.9 F (36.1 C), temperature source Temporal, resp. rate 17, height 5' 4 (1.626 m), weight 86.2 kg, last menstrual period 05/08/2016, SpO2 100%. CONSTITUTIONAL: Well-developed, well-nourished female in no acute distress.  HENT:  Normocephalic, atraumatic, External right and left ear normal. Oropharynx is clear and moist EYES: Conjunctivae and EOM are normal. Pupils are equal, round, and reactive to light. No scleral icterus.  NECK: Normal range of motion, supple, no masses SKIN: Skin is warm and dry. No rash noted. Not diaphoretic. No erythema. No pallor. NEUROLOGIC: Alert and oriented to person, place, and time. Normal reflexes, muscle tone coordination. No cranial nerve deficit noted. PSYCHIATRIC: Normal mood and affect. Normal behavior. Normal judgment and thought content. CARDIOVASCULAR: Normal heart rate noted, regular rhythm RESPIRATORY: Effort and breath sounds normal, no problems with respiration noted ABDOMEN: Soft, nontender, nondistended. PELVIC: Deferred MUSCULOSKELETAL: Normal range of motion. No edema and no tenderness. 2+ distal pulses.    Labs: Results for orders placed or performed in visit on 07/12/24 (from the past 2 weeks)  CMP (Cancer Center only)   Collection Time: 07/12/24 10:29 AM  Result Value Ref Range   Sodium 137 135 - 145 mmol/L    Potassium 3.6 3.5 - 5.1 mmol/L   Chloride 102 98 - 111 mmol/L   CO2 26 22 - 32 mmol/L   Glucose, Bld 102 (H) 70 - 99 mg/dL   BUN 12 6 - 20 mg/dL   Creatinine 9.24 9.55 - 1.00 mg/dL   Calcium  8.8 (L) 8.9 - 10.3 mg/dL   Total Protein 7.3 6.5 - 8.1 g/dL   Albumin 4.0 3.5 - 5.0 g/dL   AST 19 15 - 41 U/L   ALT 15 0 - 44 U/L   Alkaline Phosphatase 97 38 - 126 U/L   Total Bilirubin 0.8 0.0 - 1.2 mg/dL   GFR, Estimated >39 >39 mL/min   Anion gap 9 5 - 15  CBC with Differential (Cancer Center Only)   Collection Time: 07/12/24 10:29 AM  Result Value Ref Range   WBC Count 12.4 (H) 4.0 - 10.5 K/uL   RBC 3.36 (L) 3.87 - 5.11 MIL/uL   Hemoglobin 9.8 (L) 12.0 - 15.0 g/dL   HCT 70.4 (L) 63.9 - 53.9 %   MCV 87.8 80.0 - 100.0 fL   MCH 29.2 26.0 - 34.0 pg   MCHC 33.2 30.0 - 36.0 g/dL   RDW 87.3 88.4 - 84.4 %   Platelet Count 215 150 - 400 K/uL   nRBC 0.5 (H) 0.0 - 0.2 %   Neutrophils Relative % 68 %   Neutro Abs 8.4 (H) 1.7 - 7.7 K/uL   Lymphocytes Relative 11 %   Lymphs Abs 1.4 0.7 - 4.0 K/uL   Monocytes Relative 9 %   Monocytes Absolute 1.1 (H) 0.1 - 1.0 K/uL   Eosinophils Relative 0 %   Eosinophils Absolute 0.0 0.0 - 0.5 K/uL   Basophils Relative 0 %   Basophils Absolute 0.0 0.0 - 0.1 K/uL   WBC Morphology See Note    Smear Review Normal platelet morphology    Immature Granulocytes 12 %   Abs Immature Granulocytes 1.50 (H) 0.00 - 0.07 K/uL   Polychromasia PRESENT   Results for orders placed or performed in visit on 07/08/24 (from the past 2 weeks)  CMP (Cancer Center only)   Collection Time: 07/08/24  9:54 AM  Result Value Ref Range  Sodium 134 (L) 135 - 145 mmol/L   Potassium 3.0 (L) 3.5 - 5.1 mmol/L   Chloride 99 98 - 111 mmol/L   CO2 26 22 - 32 mmol/L   Glucose, Bld 163 (H) 70 - 99 mg/dL   BUN 12 6 - 20 mg/dL   Creatinine 9.14 9.55 - 1.00 mg/dL   Calcium  8.5 (L) 8.9 - 10.3 mg/dL   Total Protein 7.0 6.5 - 8.1 g/dL   Albumin 3.7 3.5 - 5.0 g/dL   AST 21 15 - 41 U/L   ALT 15  0 - 44 U/L   Alkaline Phosphatase 91 38 - 126 U/L   Total Bilirubin 0.6 0.0 - 1.2 mg/dL   GFR, Estimated >39 >39 mL/min   Anion gap 9 5 - 15  CBC with Differential/Platelet   Collection Time: 07/08/24  9:54 AM  Result Value Ref Range   WBC 9.4 4.0 - 10.5 K/uL   RBC 3.32 (L) 3.87 - 5.11 MIL/uL   Hemoglobin 9.9 (L) 12.0 - 15.0 g/dL   HCT 70.8 (L) 63.9 - 53.9 %   MCV 87.7 80.0 - 100.0 fL   MCH 29.8 26.0 - 34.0 pg   MCHC 34.0 30.0 - 36.0 g/dL   RDW 87.5 88.4 - 84.4 %   Platelets 228 150 - 400 K/uL   nRBC 0.2 0.0 - 0.2 %   Neutrophils Relative % 62 %   Neutro Abs 5.8 1.7 - 7.7 K/uL   Lymphocytes Relative 12 %   Lymphs Abs 1.1 0.7 - 4.0 K/uL   Monocytes Relative 8 %   Monocytes Absolute 0.7 0.1 - 1.0 K/uL   Eosinophils Relative 0 %   Eosinophils Absolute 0.0 0.0 - 0.5 K/uL   Basophils Relative 1 %   Basophils Absolute 0.1 0.0 - 0.1 K/uL   WBC Morphology See Note    Smear Review Normal platelet morphology    Immature Granulocytes 17 %   Abs Immature Granulocytes 1.61 (H) 0.00 - 0.07 K/uL   Polychromasia PRESENT      Imaging Studies: ECHOCARDIOGRAM COMPLETE Result Date: 06/27/2024    ECHOCARDIOGRAM REPORT   Patient Name:   MIKITA LESMEISTER Date of Exam: 06/27/2024 Medical Rec #:  [id number]          Height:       64.0 in Accession #:    [id number]         Weight:       190.7 lb Date of Birth:  August 02, 1966         BSA:          1.917 m Patient Age:    57 years           BP:           129/73 mmHg Patient Gender: F                  HR:           73 bpm. Exam Location:  [facility] Procedure: 2D Echo, Cardiac Doppler and Color Doppler (Both Spectral and Color            Flow Doppler were utilized during procedure). Indications:     Elevated troponin  History:         Patient has prior history of Echocardiogram examinations, most                  recent 11/11/2023. COPD; Risk Factors:Hypertension and Sleep  Apnea.  Sonographer:     Christopher Furnace Referring Phys:  [id number] DELAYNE LULLA SOLIAN  Diagnosing Phys: Deatrice Cage MD  Sonographer Comments: Technically difficult study due to poor echo windows, no apical window and no subcostal window. Image acquisition challenging due to COPD. IMPRESSIONS  1. Left ventricular ejection fraction, by estimation, is 60 to 65%. The left ventricle has normal function. The left ventricle has no regional wall motion abnormalities. There is mild left ventricular hypertrophy. Left ventricular diastolic function could not be evaluated.  2. Right ventricular systolic function is normal. The right ventricular size is normal. Tricuspid regurgitation signal is inadequate for assessing PA pressure.  3. Left atrial size was mildly dilated.  4. The mitral valve is normal in structure. No evidence of mitral valve regurgitation. No evidence of mitral stenosis.  5. The aortic valve is normal in structure. Aortic valve regurgitation is not visualized. No aortic stenosis is present. FINDINGS  Left Ventricle: Left ventricular ejection fraction, by estimation, is 60 to 65%. The left ventricle has normal function. The left ventricle has no regional wall motion abnormalities. The left ventricular internal cavity size was normal in size. There is  mild left ventricular hypertrophy. Left ventricular diastolic function could not be evaluated. Right Ventricle: The right ventricular size is normal. No increase in right ventricular wall thickness. Right ventricular systolic function is normal. Tricuspid regurgitation signal is inadequate for assessing PA pressure. Left Atrium: Left atrial size was mildly dilated. Right Atrium: Right atrial size was normal in size. Pericardium: There is no evidence of pericardial effusion. Mitral Valve: The mitral valve is normal in structure. No evidence of mitral valve regurgitation. No evidence of mitral valve stenosis. Tricuspid Valve: The tricuspid valve is normal in structure. Tricuspid valve regurgitation is not demonstrated. No evidence of tricuspid  stenosis. Aortic Valve: The aortic valve is normal in structure. Aortic valve regurgitation is not visualized. No aortic stenosis is present. Pulmonic Valve: The pulmonic valve was normal in structure. Pulmonic valve regurgitation is not visualized. No evidence of pulmonic stenosis. Aorta: The aortic root is normal in size and structure. Venous: The inferior vena cava was not well visualized. IAS/Shunts: No atrial level shunt detected by color flow Doppler.  LEFT VENTRICLE PLAX 2D LVIDd:         3.55 cm LVIDs:         2.13 cm LV PW:         1.10 cm LV IVS:        1.11 cm LVOT diam:     1.30 cm LVOT Area:     1.33 cm  LEFT ATRIUM         Index LA diam:    4.10 cm 2.14 cm/m   AORTA Ao Root diam: 2.50 cm  SHUNTS Systemic Diam: 1.30 cm Deatrice Cage MD Electronically signed by Deatrice Cage MD Signature Date/Time: 06/27/2024/11:24:36 AM    Final    CT Angio Chest PE W and/or Wo Contrast Result Date: 06/24/2024 EXAM: LUNG CANCER SCREENING CHEST CT WITHOUT AND WITH CONTRAST 06/24/2024 09:31:03 PM TECHNIQUE: Low-dose CT of the chest was performed without and with the administration of 75 mL iohexol  (OMNIPAQUE ) 350 MG/ML injection. Multiplanar reformatted images are provided for review. Automated exposure control, iterative reconstruction, and/or weight based adjustment of the mA/kV was utilized to reduce the radiation dose to as low as reasonably achievable. COMPARISON: None available. CLINICAL HISTORY: Pulmonary embolism (PE) suspected, high prob. FINDINGS: MEDIASTINUM: Chest port catheter tip is seen. The left ventricle is  visualized. The central airways are clear. LYMPH NODES: No mediastinal, hilar or axillary lymphadenopathy. LUNGS AND PLEURA: No focal consolidation or pulmonary edema. No pleural effusion or pneumothorax. No suspicious pulmonary nodules. SOFT TISSUES AND BONES: There is a lobulated soft tissue density measuring 2.3 x 2.4 cm in the right breast 3. No acute abnormality of the bones. UPPER ABDOMEN:  Limited images of the upper abdomen demonstrate cholecystectomy clips. No other acute abnormality is seen in the limited upper abdomen. IMPRESSION: 1. No evidence of pulmonary embolism. 2. Right breast lobulated soft tissue density measuring 2.3 x 2.4 cm; clinical correlation recommended. Electronically signed by: Greig Pique MD 06/24/2024 09:54 PM EST RP Workstation: [id number]   DG Chest 1 View Result Date: 06/24/2024 EXAM: 1 VIEW(S) XRAY OF THE CHEST 06/24/2024 06:53:26 PM COMPARISON: 06/22/2024 CLINICAL HISTORY: Chest pain FINDINGS: LINES, TUBES AND DEVICES: Stable left chest Port-A-Cath with tip in low right atrium. LUNGS AND PLEURA: Persistent low lung volumes. Azygous lobe. Mild right hemidiaphragm elevation. No focal pulmonary opacity. No pulmonary edema. No pleural effusion. No pneumothorax. HEART AND MEDIASTINUM: Borderline cardiomegaly. No acute abnormality of the mediastinal silhouette. BONES AND SOFT TISSUES: No acute osseous abnormality. IMPRESSION: 1. No acute cardiopulmonary process. 2. Borderline cardiomegaly. 3. Left port-a-cath with tip at low right atrium. Electronically signed by: Rockey Kilts MD 06/24/2024 07:39 PM EST RP Workstation: [id number]   NM Cardiac Muga Rest Result Date: 06/24/2024 EXAM: MUGA SCAN 06/24/2024 10:23:04 AM TECHNIQUE: RADIOPHARMACEUTICAL: The patient's erythrocytes were labeled with 22.19 mCi of 99 mTc. Following injection, scintigraphy with ECG gating was acquired. COMPARISON: None available. CLINICAL HISTORY: Chemotherapy patient, assess LV function. FINDINGS: The cardiac chambers and great vessels appear grossly normal in size and configuration. Right ventricular contractility is subjectively normal. Left ventricle ejection fraction is 57.4%. IMPRESSION: 1. Left ventricular ejection fraction is 57.4%. Electronically signed by: Norleen Boxer MD 06/24/2024 04:30 PM EST RP Workstation: [id number]   DG Chest 1 View Result Date: 06/22/2024 EXAM: 1 VIEW(S) XRAY OF  THE CHEST 06/22/2024 10:06:00 AM COMPARISON: 10/29/2023 CLINICAL HISTORY: Pneumothorax FINDINGS: LINES, TUBES AND DEVICES: Left chest power port in place with tip terminating over the right atrium. LUNGS AND PLEURA: Low lung volumes. Right upper lobe azygos lobe, normal variant. No focal pulmonary opacity. No pulmonary edema. No pleural effusion. No pneumothorax. HEART AND MEDIASTINUM: No acute abnormality of the cardiac and mediastinal silhouettes. BONES AND SOFT TISSUES: No acute osseous abnormality. IMPRESSION: 1. No acute cardiopulmonary process. 2. Low lung volumes,  left chest power port in place. Electronically signed by: Helayne Hurst MD 06/22/2024 10:15 AM EST RP Workstation: [id number]   DG C-Arm 1-60 Min-No Report Result Date: 06/22/2024 Fluoroscopy was utilized by the requesting physician.  No radiographic interpretation.    Assessment:     58 y.o. G3P3003 with PMB, US  showing EMT of 13mm and a 2.1cm endometrial polyp in the lower segment. She was also recently diagnosed with right breast cancer and is getting ready to start chemotherapy.    Plan:    Counseling: Procedure, risks, reasons, benefits and complications (including injury to bowel, bladder, major blood vessel, ureter, bleeding, possibility of transfusion, infection, or fistula formation) reviewed in detail. Likelihood of success in alleviating the patient's condition was discussed. Routine postoperative instructions will be reviewed with the patient and her family in detail after surgery.  The patient concurred with the proposed plan, giving informed written consent for the surgery.   -To OR when ready -Post op visit and results in 2 wks -Pain medications sent  to pharmacy -Pelvic rest until cleared at post-op visit  This encounter was facilitated by video ASL interpreter.   Estil Mangle, DO [facility]

## 2024-07-18 NOTE — Anesthesia Postprocedure Evaluation (Signed)
 Anesthesia Post Note  Patient: Caitlin [name]  Procedure(s) Performed: DILATATION AND CURETTAGE /HYSTEROSCOPY  Patient location during evaluation: PACU Anesthesia Type: General Level of consciousness: awake and alert Pain management: pain level controlled Vital Signs Assessment: post-procedure vital signs reviewed and stable Respiratory status: spontaneous breathing, nonlabored ventilation and respiratory function stable Cardiovascular status: blood pressure returned to baseline and stable Postop Assessment: no apparent nausea or vomiting Anesthetic complications: no   No notable events documented.   Last Vitals:  Vitals:   07/18/24 1515 07/18/24 1527  BP: 123/69   Pulse: 70 71  Resp: 13 16  Temp:  36.6 C  SpO2: 100% 98%    Last Pain:  Vitals:   07/18/24 1527  TempSrc: Temporal  PainSc: 0-No pain                 Fairy POUR Gala Padovano

## 2024-07-18 NOTE — Progress Notes (Signed)
 Used a 2nd interpreter also thru the ipad, interpreter's name Milo and # is [phone]

## 2024-07-18 NOTE — Progress Notes (Signed)
 Used a ASL intgerpreter thru the ipad. Interpreter's name is Patent Examiner # is [phone]

## 2024-07-19 ENCOUNTER — Encounter: Payer: Self-pay | Admitting: Obstetrics

## 2024-07-20 ENCOUNTER — Inpatient Hospital Stay: Attending: Oncology | Admitting: Hospice and Palliative Medicine

## 2024-07-20 ENCOUNTER — Ambulatory Visit: Payer: Self-pay | Admitting: Obstetrics

## 2024-07-20 DIAGNOSIS — C50911 Malignant neoplasm of unspecified site of right female breast: Secondary | ICD-10-CM | POA: Insufficient documentation

## 2024-07-20 DIAGNOSIS — R079 Chest pain, unspecified: Secondary | ICD-10-CM | POA: Insufficient documentation

## 2024-07-20 DIAGNOSIS — Z803 Family history of malignant neoplasm of breast: Secondary | ICD-10-CM | POA: Insufficient documentation

## 2024-07-20 DIAGNOSIS — H9190 Unspecified hearing loss, unspecified ear: Secondary | ICD-10-CM | POA: Insufficient documentation

## 2024-07-20 DIAGNOSIS — M791 Myalgia, unspecified site: Secondary | ICD-10-CM | POA: Insufficient documentation

## 2024-07-20 DIAGNOSIS — Z1722 Progesterone receptor negative status: Secondary | ICD-10-CM | POA: Insufficient documentation

## 2024-07-20 DIAGNOSIS — Z1732 Human epidermal growth factor receptor 2 negative status: Secondary | ICD-10-CM | POA: Insufficient documentation

## 2024-07-20 DIAGNOSIS — Z800 Family history of malignant neoplasm of digestive organs: Secondary | ICD-10-CM | POA: Insufficient documentation

## 2024-07-20 DIAGNOSIS — K219 Gastro-esophageal reflux disease without esophagitis: Secondary | ICD-10-CM | POA: Insufficient documentation

## 2024-07-20 DIAGNOSIS — Z79899 Other long term (current) drug therapy: Secondary | ICD-10-CM | POA: Insufficient documentation

## 2024-07-20 DIAGNOSIS — Z170 Estrogen receptor positive status [ER+]: Secondary | ICD-10-CM | POA: Insufficient documentation

## 2024-07-20 DIAGNOSIS — G4730 Sleep apnea, unspecified: Secondary | ICD-10-CM | POA: Insufficient documentation

## 2024-07-20 DIAGNOSIS — I10 Essential (primary) hypertension: Secondary | ICD-10-CM | POA: Insufficient documentation

## 2024-07-20 DIAGNOSIS — Z808 Family history of malignant neoplasm of other organs or systems: Secondary | ICD-10-CM | POA: Insufficient documentation

## 2024-07-20 DIAGNOSIS — J4489 Other specified chronic obstructive pulmonary disease: Secondary | ICD-10-CM | POA: Insufficient documentation

## 2024-07-20 DIAGNOSIS — D72829 Elevated white blood cell count, unspecified: Secondary | ICD-10-CM | POA: Insufficient documentation

## 2024-07-20 DIAGNOSIS — D649 Anemia, unspecified: Secondary | ICD-10-CM | POA: Insufficient documentation

## 2024-07-20 DIAGNOSIS — G4733 Obstructive sleep apnea (adult) (pediatric): Secondary | ICD-10-CM | POA: Insufficient documentation

## 2024-07-20 DIAGNOSIS — Z5189 Encounter for other specified aftercare: Secondary | ICD-10-CM | POA: Insufficient documentation

## 2024-07-20 DIAGNOSIS — Z5111 Encounter for antineoplastic chemotherapy: Secondary | ICD-10-CM | POA: Insufficient documentation

## 2024-07-20 DIAGNOSIS — J45909 Unspecified asthma, uncomplicated: Secondary | ICD-10-CM | POA: Insufficient documentation

## 2024-07-20 LAB — SURGICAL PATHOLOGY

## 2024-07-20 NOTE — Progress Notes (Signed)
 Multidisciplinary Oncology Council Documentation  Jackelyne Sayer Ruehl was presented by our [facility] on 07/20/2024, which included representatives from:  Palliative Care Dietitian  Physical/Occupational Therapist Nurse Navigator Genetics Social work Survivorship RN Financial Navigator Research RN   Ahliyah currently presents with history of breast cancer  We reviewed previous medical and familial history, history of present illness, and recent lab results along with all available histopathologic and imaging studies. The MOC considered available treatment options and made the following recommendations/referrals:  Nutrition, Genetics, SW  The MOC is a meeting of clinicians from various specialty areas who evaluate and discuss patients for whom a multidisciplinary approach is being considered. Final determinations in the plan of care are those of the provider(s).   Today's extended care, comprehensive team conference, Kandy was not present for the discussion and was not examined.

## 2024-07-21 ENCOUNTER — Other Ambulatory Visit: Payer: Self-pay

## 2024-07-25 MED FILL — Fosaprepitant Dimeglumine For IV Infusion 150 MG (Base Eq): INTRAVENOUS | Qty: 5 | Status: AC

## 2024-07-26 ENCOUNTER — Encounter: Payer: Self-pay | Admitting: Licensed Clinical Social Worker

## 2024-07-26 ENCOUNTER — Encounter: Payer: Self-pay | Admitting: Oncology

## 2024-07-26 ENCOUNTER — Inpatient Hospital Stay: Admitting: Oncology

## 2024-07-26 ENCOUNTER — Inpatient Hospital Stay

## 2024-07-26 ENCOUNTER — Other Ambulatory Visit: Payer: Self-pay | Admitting: Licensed Clinical Social Worker

## 2024-07-26 ENCOUNTER — Inpatient Hospital Stay: Admitting: Licensed Clinical Social Worker

## 2024-07-26 VITALS — BP 132/74 | HR 80 | Temp 97.9°F | Resp 18 | Ht 64.0 in

## 2024-07-26 VITALS — BP 141/77 | HR 77

## 2024-07-26 DIAGNOSIS — G4730 Sleep apnea, unspecified: Secondary | ICD-10-CM | POA: Diagnosis not present

## 2024-07-26 DIAGNOSIS — Z79899 Other long term (current) drug therapy: Secondary | ICD-10-CM | POA: Diagnosis not present

## 2024-07-26 DIAGNOSIS — C50911 Malignant neoplasm of unspecified site of right female breast: Secondary | ICD-10-CM

## 2024-07-26 DIAGNOSIS — D72829 Elevated white blood cell count, unspecified: Secondary | ICD-10-CM | POA: Diagnosis not present

## 2024-07-26 DIAGNOSIS — J45909 Unspecified asthma, uncomplicated: Secondary | ICD-10-CM | POA: Diagnosis not present

## 2024-07-26 DIAGNOSIS — Z1732 Human epidermal growth factor receptor 2 negative status: Secondary | ICD-10-CM | POA: Diagnosis not present

## 2024-07-26 DIAGNOSIS — Z808 Family history of malignant neoplasm of other organs or systems: Secondary | ICD-10-CM | POA: Diagnosis not present

## 2024-07-26 DIAGNOSIS — Z1379 Encounter for other screening for genetic and chromosomal anomalies: Secondary | ICD-10-CM

## 2024-07-26 DIAGNOSIS — D649 Anemia, unspecified: Secondary | ICD-10-CM | POA: Diagnosis not present

## 2024-07-26 DIAGNOSIS — Z803 Family history of malignant neoplasm of breast: Secondary | ICD-10-CM | POA: Diagnosis not present

## 2024-07-26 DIAGNOSIS — M791 Myalgia, unspecified site: Secondary | ICD-10-CM | POA: Diagnosis not present

## 2024-07-26 DIAGNOSIS — R079 Chest pain, unspecified: Secondary | ICD-10-CM | POA: Diagnosis not present

## 2024-07-26 DIAGNOSIS — I10 Essential (primary) hypertension: Secondary | ICD-10-CM | POA: Diagnosis not present

## 2024-07-26 DIAGNOSIS — K219 Gastro-esophageal reflux disease without esophagitis: Secondary | ICD-10-CM | POA: Diagnosis not present

## 2024-07-26 DIAGNOSIS — G4733 Obstructive sleep apnea (adult) (pediatric): Secondary | ICD-10-CM | POA: Diagnosis not present

## 2024-07-26 DIAGNOSIS — H9190 Unspecified hearing loss, unspecified ear: Secondary | ICD-10-CM | POA: Diagnosis not present

## 2024-07-26 DIAGNOSIS — Z5189 Encounter for other specified aftercare: Secondary | ICD-10-CM | POA: Diagnosis not present

## 2024-07-26 DIAGNOSIS — Z1722 Progesterone receptor negative status: Secondary | ICD-10-CM | POA: Diagnosis not present

## 2024-07-26 DIAGNOSIS — J4489 Other specified chronic obstructive pulmonary disease: Secondary | ICD-10-CM | POA: Diagnosis not present

## 2024-07-26 DIAGNOSIS — Z800 Family history of malignant neoplasm of digestive organs: Secondary | ICD-10-CM | POA: Diagnosis not present

## 2024-07-26 DIAGNOSIS — Z170 Estrogen receptor positive status [ER+]: Secondary | ICD-10-CM | POA: Diagnosis not present

## 2024-07-26 DIAGNOSIS — Z5111 Encounter for antineoplastic chemotherapy: Secondary | ICD-10-CM | POA: Diagnosis present

## 2024-07-26 LAB — CMP (CANCER CENTER ONLY)
ALT: 14 U/L (ref 0–44)
AST: 17 U/L (ref 15–41)
Albumin: 4.3 g/dL (ref 3.5–5.0)
Alkaline Phosphatase: 118 U/L (ref 38–126)
Anion gap: 11 (ref 5–15)
BUN: 10 mg/dL (ref 6–20)
CO2: 26 mmol/L (ref 22–32)
Calcium: 9.3 mg/dL (ref 8.9–10.3)
Chloride: 105 mmol/L (ref 98–111)
Creatinine: 0.81 mg/dL (ref 0.44–1.00)
GFR, Estimated: >60 mL/min (ref >60)
Glucose, Bld: 114 mg/dL — ABNORMAL HIGH (ref 70–99)
Potassium: 3.9 mmol/L (ref 3.5–5.1)
Sodium: 142 mmol/L (ref 135–145)
Total Bilirubin: 0.2 mg/dL (ref 0.0–1.2)
Total Protein: 7.1 g/dL (ref 6.5–8.1)

## 2024-07-26 LAB — CBC WITH DIFFERENTIAL (CANCER CENTER ONLY)
Abs Immature Granulocytes: 2.02 K/uL — ABNORMAL HIGH (ref 0.00–0.07)
Basophils Absolute: 0.1 K/uL (ref 0.0–0.1)
Basophils Relative: 1 %
Eosinophils Absolute: 0 K/uL (ref 0.0–0.5)
Eosinophils Relative: 0 %
HCT: 28 % — ABNORMAL LOW (ref 36.0–46.0)
Hemoglobin: 9 g/dL — ABNORMAL LOW (ref 12.0–15.0)
Immature Granulocytes: 13 %
Lymphocytes Relative: 7 %
Lymphs Abs: 1.1 K/uL (ref 0.7–4.0)
MCH: 29.5 pg (ref 26.0–34.0)
MCHC: 32.1 g/dL (ref 30.0–36.0)
MCV: 91.8 fL (ref 80.0–100.0)
Monocytes Absolute: 1.3 K/uL — ABNORMAL HIGH (ref 0.1–1.0)
Monocytes Relative: 8 %
Neutro Abs: 10.7 K/uL — ABNORMAL HIGH (ref 1.7–7.7)
Neutrophils Relative %: 71 %
Platelet Count: 366 K/uL (ref 150–400)
RBC: 3.05 MIL/uL — ABNORMAL LOW (ref 3.87–5.11)
RDW: 14.4 % (ref 11.5–15.5)
Smear Review: NORMAL
WBC Count: 15.2 K/uL — ABNORMAL HIGH (ref 4.0–10.5)
nRBC: 0.3 % — ABNORMAL HIGH (ref 0.0–0.2)

## 2024-07-26 LAB — GENETIC SCREENING ORDER

## 2024-07-26 MED ORDER — PALONOSETRON HCL INJECTION 0.25 MG/5ML
0.2500 mg | Freq: Once | INTRAVENOUS | Status: AC
  Administered 2024-07-26: 0.25 mg via INTRAVENOUS
  Filled 2024-07-26: qty 5

## 2024-07-26 MED ORDER — DOXORUBICIN HCL CHEMO IV INJECTION 2 MG/ML
60.0000 mg/m2 | Freq: Once | INTRAVENOUS | Status: AC
  Administered 2024-07-26: 118 mg via INTRAVENOUS
  Filled 2024-07-26: qty 59

## 2024-07-26 MED ORDER — SODIUM CHLORIDE 0.9 % IV SOLN
INTRAVENOUS | Status: DC
  Filled 2024-07-26: qty 250

## 2024-07-26 MED ORDER — SODIUM CHLORIDE 0.9 % IV SOLN
600.0000 mg/m2 | Freq: Once | INTRAVENOUS | Status: AC
  Administered 2024-07-26: 1180 mg via INTRAVENOUS
  Filled 2024-07-26: qty 59

## 2024-07-26 MED ORDER — DEXAMETHASONE SOD PHOSPHATE PF 10 MG/ML IJ SOLN
10.0000 mg | Freq: Once | INTRAMUSCULAR | Status: AC
  Administered 2024-07-26: 10 mg via INTRAVENOUS

## 2024-07-26 MED ORDER — SODIUM CHLORIDE 0.9 % IV SOLN
150.0000 mg | Freq: Once | INTRAVENOUS | Status: AC
  Administered 2024-07-26: 150 mg via INTRAVENOUS
  Filled 2024-07-26: qty 150

## 2024-07-26 NOTE — Progress Notes (Signed)
 REFERRING PROVIDER: Jacobo Evalene PARAS, MD [address] [place],  [place] [address]  PRIMARY PROVIDER:  Donzella Lauraine SAILOR, DO  PRIMARY REASON FOR VISIT:  1. Invasive ductal carcinoma of right breast (HCC)   2. Family history of breast cancer     HISTORY OF PRESENT ILLNESS:   Ms. Tiedt, a 58 y.o. female, was seen for a [facility] cancer genetics consultation at the request of Dr. Jacobo due to a personal and family history of breast cancer.  Ms. Hoelting presents to clinic today to discuss the possibility of a hereditary predisposition to cancer, genetic testing, and to further clarify her future cancer risks, as well as potential cancer risks for family members.   CANCER HISTORY:  Oncology History  Invasive ductal carcinoma of right breast (HCC)  06/16/2024 Initial Diagnosis   Invasive ductal carcinoma of right breast (HCC)   06/16/2024 Cancer Staging   Staging form: Breast, AJCC 8th Edition - Clinical stage from 06/16/2024: Stage IIB (cT2, cN0, cM0, G3, ER+, PR-, HER2-) - Signed by Jacobo Evalene PARAS, MD on 06/16/2024 Stage prefix: Initial diagnosis Histologic grading system: 3 grade system   06/28/2024 -  Chemotherapy   Patient is on Treatment Plan : BREAST DOSE DENSE AC q14d / PACLitaxel q7d      In 2025, at the age of 86, Ms. Zinger was diagnosed with IDC of the right breast, ER+, PR-, HER2-. The treatment plan includes neoadjuvant chemotherapy, .    Past Medical History:  Diagnosis Date   Asthma    Cancer (HCC)    COPD (chronic obstructive pulmonary disease) (HCC)    Deaf    Endometrial polyp    GERD (gastroesophageal reflux disease)    Hypertension 2011   NO (nasal obstruction)    OSA (obstructive sleep apnea)     Past Surgical History:  Procedure Laterality Date   BREAST BIOPSY Right 06/08/2024   US  RT BREAST BX W LOC DEV 1ST LESION IMG BX SPEC US  GUIDE 06/08/2024 [facility]   CESAREAN SECTION     x 2   CHOLECYSTECTOMY     DENTAL SURGERY  Bilateral 04/2024   Removed both fang teeth on the top and wisdom as well.   HYSTEROSCOPY WITH D & C N/A 07/18/2024   Procedure: DILATATION AND CURETTAGE /HYSTEROSCOPY;  Surgeon: Leigh Sober, MD;  Location: [facility];  Service: Gynecology;  Laterality: N/A;   PORTACATH PLACEMENT Left 06/22/2024   Procedure: INSERTION, TUNNELED CENTRAL VENOUS DEVICE, WITH PORT;  Surgeon: Marinda Jayson KIDD, MD;  Location: [facility];  Service: General;  Laterality: Left;   TUBAL LIGATION      FAMILY HISTORY:  We obtained a detailed, 4-generation family history.  Significant diagnoses are listed below: Family History  Problem Relation Age of Onset   Heart disease Mother    Stroke Mother    Brain cancer Half-Brother        dx 30s   Breast cancer Half-Sister        dx 36s   Ms. Hauss has 2 daughters, 100 and 70, and 1 son, 43. She has 2 maternal half brothers, 1 paternal half sister and 1 paternal half brother. Her paternal half sister had breast cancer in her 30s and passed. Her paternal half brother had brain cancer in his 30s and passed.   Ms. Debruyn has limited information about paternal/maternal relatives, no other known cancers in the family.  Ms. Gowens is unaware of previous family history of genetic testing for hereditary cancer risks. There  is no reported Ashkenazi Jewish ancestry. There is no known consanguinity.    GENETIC COUNSELING ASSESSMENT: Ms. Shippee is a 58 y.o. female with a personal and family history of breast cancer which is somewhat suggestive of a hereditary cancer syndrome and predisposition to cancer. We, therefore, discussed and recommended the following at today's visit.   DISCUSSION: We discussed that, in general, most cancer is not inherited in families, but instead is sporadic or familial. Sporadic cancers occur by chance and typically happen at older ages (>50 years). We discussed that approximately 10% of breast cancer is hereditary. Most cases of hereditary breast cancer are  associated with BRCA1/BRCA2 genes, although there are other genes associated with hereditary cancer as well. Cancers and risks are gene specific. We discussed that testing is beneficial for several reasons including knowing about cancer risks, identifying potential screening and risk-reduction options that may be appropriate, and to understand if other family members could be at risk for cancer and allow them to undergo genetic testing.   We reviewed the characteristics, features and inheritance patterns of hereditary cancer syndromes. We also discussed genetic testing, including the appropriate family members to test, the process of testing, insurance coverage and turn-around-time for results. We discussed the implications of a negative, positive and/or variant of uncertain significant result. We recommended Ms. Lalani pursue genetic testing for the [facility] Common Hereditary Cancers+RNA gene panel.   The Common Hereditary Cancers Panel + RNA offered by [facility] includes sequencing and/or deletion duplication testing of the following 48 genes: APC*, ATM*, AXIN2, BAP1, BARD1, BMPR1A, BRCA1, BRCA2, BRIP1, CDH1, CDK4, CDKN2A (p14ARF), CDKN2A (p16INK4a), CHEK2, CTNNA1, DICER1*, EPCAM*, FH*, GREM1*, HOXB13, KIT, MBD4, MEN1*, MLH1*, MSH2*, MSH3*, MSH6*, MUTYH, NF1*, NTHL1, PALB2, PDGFRA, PMS2*, POLD1*, POLE, PTEN*, RAD51C, RAD51D, SDHA*, SDHB, SDHC*, SDHD, SMAD4, SMARCA4, STK11, TP53, TSC1*, TSC2, VHL.   Based on Ms. Nurse's personal and family history of cancer, she meets medical criteria for genetic testing. Despite that she meets criteria, she may still have an out of pocket cost.   PLAN: After considering the risks, benefits, and limitations, Ms. Stretch provided informed consent to pursue genetic testing and the blood sample was sent to [facility] for analysis of the Common Hereditary Cancers+RNA Panel. Results should be available within approximately 2-3 weeks' time, at which point they will be  disclosed by telephone to Ms. Reading, as will any additional recommendations warranted by these results. Ms. Mcelreath will receive a summary of her genetic counseling visit and a copy of her results once available. This information will also be available in Epic.   Ms. Lucente questions were answered to her satisfaction today. Our contact information was provided should additional questions or concerns arise. Thank you for the referral and allowing us  to share in the care of your patient.   Dena Cary, MS, [facility] Counselor [email or web address] Phone: [phone]  I personally spent a total of 40 minutes in the care of the patient today including counseling and educating, placing orders, and documenting clinical information in the EHR. Dr. Delinda was available for discussion regarding this case.   _______________________________________________________________________ For Office Staff:  Number of people involved in session: 3; Patient's daughter and sign language interpreter both present in infusion room. Was an Intern/ student involved with case: no

## 2024-07-26 NOTE — Patient Instructions (Signed)
 [facility] - A DEPT OF [facility]  Discharge Instructions: Thank you for choosing [facility] to provide your oncology and hematology care.  If you have a lab appointment with the Cancer Center, please go directly to the Cancer Center and check in at the registration area.  Wear comfortable clothing and clothing appropriate for easy access to any Portacath or PICC line.   We strive to give you quality time with your provider. You may need to reschedule your appointment if you arrive late (15 or more minutes).  Arriving late affects you and other patients whose appointments are after yours.  Also, if you miss three or more appointments without notifying the office, you may be dismissed from the clinic at the provider's discretion.      For prescription refill requests, have your pharmacy contact our office and allow 72 hours for refills to be completed.    Today you received the following chemotherapy and/or immunotherapy agents ADRIAMYACIN and CYTOXAN      To help prevent nausea and vomiting after your treatment, we encourage you to take your nausea medication as directed.  BELOW ARE SYMPTOMS THAT SHOULD BE REPORTED IMMEDIATELY: *FEVER GREATER THAN 100.4 F (38 C) OR HIGHER *CHILLS OR SWEATING *NAUSEA AND VOMITING THAT IS NOT CONTROLLED WITH YOUR NAUSEA MEDICATION *UNUSUAL SHORTNESS OF BREATH *UNUSUAL BRUISING OR BLEEDING *URINARY PROBLEMS (pain or burning when urinating, or frequent urination) *BOWEL PROBLEMS (unusual diarrhea, constipation, pain near the anus) TENDERNESS IN MOUTH AND THROAT WITH OR WITHOUT PRESENCE OF ULCERS (sore throat, sores in mouth, or a toothache) UNUSUAL RASH, SWELLING OR PAIN  UNUSUAL VAGINAL DISCHARGE OR ITCHING   Items with * indicate a potential emergency and should be followed up as soon as possible or go to the Emergency Department if any problems should occur.  Please show the CHEMOTHERAPY ALERT CARD or  IMMUNOTHERAPY ALERT CARD at check-in to the Emergency Department and triage nurse.  Should you have questions after your visit or need to cancel or reschedule your appointment, please contact [facility] - A DEPT OF [name]  [phone] and follow the prompts.  Office hours are 8:00 a.m. to 4:30 p.m. Monday - Friday. Please note that voicemails left after 4:00 p.m. may not be returned until the following business day.  We are closed weekends and major holidays. You have access to a nurse at all times for urgent questions. Please call the main number to the clinic [phone] and follow the prompts.  For any non-urgent questions, you may also contact your provider using MyChart. We now offer e-Visits for anyone 43 and older to request care online for non-urgent symptoms. For details visit [email or web address].   Also download the MyChart app! Go to the app store, search MyChart, open the app, select [facility], and log in with your MyChart username and password.  Doxorubicin Injection What is this medication? DOXORUBICIN (dox oh ROO bi sin) treats some types of cancer. It works by slowing down the growth of cancer cells. This medicine may be used for other purposes; ask your health care provider or pharmacist if you have questions. COMMON BRAND NAME(S): Adriamycin, Adriamycin PFS, Adriamycin RDF, Rubex What should I tell my care team before I take this medication? They need to know if you have any of these conditions: Heart disease History of low blood cell levels caused by a medication Liver disease Recent or ongoing radiation  An unusual or allergic reaction to doxorubicin, other medications, foods, dyes, or preservatives If you or your partner are pregnant or trying to get pregnant Breast-feeding How should I use this medication? This medication is injected into a vein. It is given by your care team in a hospital or clinic setting. Talk to your  care team about the use of this medication in children. Special care may be needed. Overdosage: If you think you have taken too much of this medicine contact a poison control center or emergency room at once. NOTE: This medicine is only for you. Do not share this medicine with others. What if I miss a dose? Keep appointments for follow-up doses. It is important not to miss your dose. Call your care team if you are unable to keep an appointment. What may interact with this medication? 6-mercaptopurine Paclitaxel Phenytoin St. John's wort Trastuzumab Verapamil This list may not describe all possible interactions. Give your health care provider a list of all the medicines, herbs, non-prescription drugs, or dietary supplements you use. Also tell them if you smoke, drink alcohol, or use illegal drugs. Some items may interact with your medicine. What should I watch for while using this medication? Your condition will be monitored carefully while you are receiving this medication. You may need blood work while taking this medication. This medication may make you feel generally unwell. This is not uncommon as chemotherapy can affect healthy cells as well as cancer cells. Report any side effects. Continue your course of treatment even though you feel ill unless your care team tells you to stop. There is a maximum amount of this medication you should receive throughout your life. The amount depends on the medical condition being treated and your overall health. Your care team will watch how much of this medication you receive. Tell your care team if you have taken this medication before. Your urine may turn red for a few days after your dose. This is not blood. If your urine is dark or brown, call your care team. In some cases, you may be given additional medications to help with side effects. Follow all directions for their use. This medication may increase your risk of getting an infection. Call your care  team for advice if you get a fever, chills, sore throat, or other symptoms of a cold or flu. Do not treat yourself. Try to avoid being around people who are sick. This medication may increase your risk to bruise or bleed. Call your care team if you notice any unusual bleeding. Talk to your care team about your risk of cancer. You may be more at risk for certain types of cancers if you take this medication. Talk to your care team if you or your partner may be pregnant. Serious birth defects can occur if you take this medication during pregnancy and for 6 months after the last dose. Contraception is recommended while taking this medication and for 6 months after the last dose. Your care team can help you find the option that works for you. If your partner can get pregnant, use a condom while taking this medication and for 6 months after the last dose. Do not breastfeed while taking this medication. This medication may cause infertility. Talk to your care team if you are concerned about your fertility. What side effects may I notice from receiving this medication? Side effects that you should report to your care team as soon as possible: Allergic reactions--skin rash, itching, hives, swelling of the  face, lips, tongue, or throat Heart failure--shortness of breath, swelling of the ankles, feet, or hands, sudden weight gain, unusual weakness or fatigue Heart rhythm changes--fast or irregular heartbeat, dizziness, feeling faint or lightheaded, chest pain, trouble breathing Infection--fever, chills, cough, sore throat, wounds that don't heal, pain or trouble when passing urine, general feeling of discomfort or being unwell Low red blood cell level--unusual weakness or fatigue, dizziness, headache, trouble breathing Painful swelling, warmth, or redness of the skin, blisters or sores at the infusion site Unusual bruising or bleeding Side effects that usually do not require medical attention (report to your  care team if they continue or are bothersome): Diarrhea Hair loss Nausea Pain, redness, or swelling with sores inside the mouth or throat Red urine This list may not describe all possible side effects. Call your doctor for medical advice about side effects. You may report side effects to FDA at 1-800-FDA-1088. Where should I keep my medication? This medication is given in a hospital or clinic. It will not be stored at home. NOTE: This sheet is a summary. It may not cover all possible information. If you have questions about this medicine, talk to your doctor, pharmacist, or health care provider.  2024 Elsevier/Gold Standard (2022-11-06 00:00:00)   Cyclophosphamide Injection What is this medication? CYCLOPHOSPHAMIDE (sye kloe FOSS fa mide) treats some types of cancer. It works by slowing down the growth of cancer cells. This medicine may be used for other purposes; ask your health care provider or pharmacist if you have questions. COMMON BRAND NAME(S): Cyclophosphamide, Cytoxan, Neosar What should I tell my care team before I take this medication? They need to know if you have any of these conditions: Heart disease Irregular heartbeat or rhythm Infection Kidney problems Liver disease Low blood cell levels (white cells, platelets, or red blood cells) Lung disease Previous radiation Trouble passing urine An unusual or allergic reaction to cyclophosphamide, other medications, foods, dyes, or preservatives Pregnant or trying to get pregnant Breast-feeding How should I use this medication? This medication is injected into a vein. It is given by your care team in a hospital or clinic setting. Talk to your care team about the use of this medication in children. Special care may be needed. Overdosage: If you think you have taken too much of this medicine contact a poison control center or emergency room at once. NOTE: This medicine is only for you. Do not share this medicine with  others. What if I miss a dose? Keep appointments for follow-up doses. It is important not to miss your dose. Call your care team if you are unable to keep an appointment. What may interact with this medication? Amphotericin B Amiodarone Azathioprine Certain antivirals for HIV or hepatitis Certain medications for blood pressure, such as enalapril, lisinopril , quinapril Cyclosporine Diuretics Etanercept Indomethacin Medications that relax muscles Metronidazole  Natalizumab Tamoxifen Warfarin This list may not describe all possible interactions. Give your health care provider a list of all the medicines, herbs, non-prescription drugs, or dietary supplements you use. Also tell them if you smoke, drink alcohol, or use illegal drugs. Some items may interact with your medicine. What should I watch for while using this medication? This medication may make you feel generally unwell. This is not uncommon as chemotherapy can affect healthy cells as well as cancer cells. Report any side effects. Continue your course of treatment even though you feel ill unless your care team tells you to stop. You may need blood work while you are taking this medication.  This medication may increase your risk of getting an infection. Call your care team for advice if you get a fever, chills, sore throat, or other symptoms of a cold or flu. Do not treat yourself. Try to avoid being around people who are sick. Avoid taking medications that contain aspirin , acetaminophen , ibuprofen, naproxen , or ketoprofen unless instructed by your care team. These medications may hide a fever. Be careful brushing or flossing your teeth or using a toothpick because you may get an infection or bleed more easily. If you have any dental work done, tell your dentist you are receiving this medication. Drink water or other fluids as directed. Urinate often, even at night. Some products may contain alcohol. Ask your care team if this medication  contains alcohol. Be sure to tell all care teams you are taking this medicine. Certain medicines, like metronidazole  and disulfiram, can cause an unpleasant reaction when taken with alcohol. The reaction includes flushing, headache, nausea, vomiting, sweating, and increased thirst. The reaction can last from 30 minutes to several hours. Talk to your care team if you wish to become pregnant or think you might be pregnant. This medication can cause serious birth defects if taken during pregnancy and for 1 year after the last dose. A negative pregnancy test is required before starting this medication. A reliable form of contraception is recommended while taking this medication and for 1 year after the last dose. Talk to your care team about reliable forms of contraception. Do not father a child while taking this medication and for 4 months after the last dose. Use a condom during this time period. Do not breast-feed while taking this medication or for 1 week after the last dose. This medication may cause infertility. Talk to your care team if you are concerned about your fertility. Talk to your care team about your risk of cancer. You may be more at risk for certain types of cancer if you take this medication. What side effects may I notice from receiving this medication? Side effects that you should report to your care team as soon as possible: Allergic reactions--skin rash, itching, hives, swelling of the face, lips, tongue, or throat Dry cough, shortness of breath or trouble breathing Heart failure--shortness of breath, swelling of the ankles, feet, or hands, sudden weight gain, unusual weakness or fatigue Heart muscle inflammation--unusual weakness or fatigue, shortness of breath, chest pain, fast or irregular heartbeat, dizziness, swelling of the ankles, feet, or hands Heart rhythm changes--fast or irregular heartbeat, dizziness, feeling faint or lightheaded, chest pain, trouble  breathing Infection--fever, chills, cough, sore throat, wounds that don't heal, pain or trouble when passing urine, general feeling of discomfort or being unwell Kidney injury--decrease in the amount of urine, swelling of the ankles, hands, or feet Liver injury--right upper belly pain, loss of appetite, nausea, light-colored stool, dark yellow or brown urine, yellowing skin or eyes, unusual weakness or fatigue Low red blood cell level--unusual weakness or fatigue, dizziness, headache, trouble breathing Low sodium level--muscle weakness, fatigue, dizziness, headache, confusion Red or dark brown urine Unusual bruising or bleeding Side effects that usually do not require medical attention (report to your care team if they continue or are bothersome): Hair loss Irregular menstrual cycles or spotting Loss of appetite Nausea Pain, redness, or swelling with sores inside the mouth or throat Vomiting This list may not describe all possible side effects. Call your doctor for medical advice about side effects. You may report side effects to FDA at 1-800-FDA-1088. Where should I  keep my medication? This medication is given in a hospital or clinic. It will not be stored at home. NOTE: This sheet is a summary. It may not cover all possible information. If you have questions about this medicine, talk to your doctor, pharmacist, or health care provider.  2024 Elsevier/Gold Standard (2021-12-20 00:00:00)

## 2024-07-26 NOTE — Progress Notes (Signed)
 [facility]  [phone] [phone]  ID: Caitlin [name] OB: Oct 10, 1965  MR#: [id number]  RDW#:[id number]  Patient Care Team: Donzella Lauraine SAILOR, DO as PCP - General (Family Medicine) Georgina Shasta POUR, RN as Oncology Nurse Navigator Jacobo, Evalene PARAS, MD as Consulting Physician (Oncology)  CHIEF COMPLAINT: Clinical stage IIb ER positive, PR/HER2 negative invasive carcinoma of the right breast.  INTERVAL HISTORY: Patient returns to clinic today for repeat laboratory, further evaluation, and consideration of cycle 3 of neoadjuvant Adriamycin  and Cytoxan .  She continues to tolerate her treatments well without significant side effects.  She currently feels well and is asymptomatic.  She admits to myalgias with her G-CSF support, but these resolved within 24 to 48 hours.  She has no neurologic complaints.  She denies any recent fevers or illnesses.  She has a good appetite and denies weight loss.  She has no chest pain, shortness of breath, cough, or hemoptysis.  She denies any nausea, vomiting, constipation, or diarrhea.  She has no urinary complaints.  Patient offers no further specific complaints today.  REVIEW OF SYSTEMS:   Review of Systems  Constitutional: Negative.  Negative for fever, malaise/fatigue and weight loss.  Respiratory: Negative.  Negative for cough, hemoptysis and shortness of breath.   Cardiovascular: Negative.  Negative for chest pain and leg swelling.  Gastrointestinal: Negative.  Negative for abdominal pain.  Genitourinary: Negative.  Negative for dysuria.  Musculoskeletal: Negative.  Negative for back pain.  Skin: Negative.  Negative for rash.  Neurological: Negative.  Negative for dizziness, focal weakness and weakness.  Psychiatric/Behavioral: Negative.  The patient is not nervous/anxious.     As per HPI. Otherwise, a complete review of systems is negative.  PAST MEDICAL HISTORY: Past Medical History:  Diagnosis Date    Asthma    Cancer (HCC)    COPD (chronic obstructive pulmonary disease) (HCC)    Deaf    Endometrial polyp    GERD (gastroesophageal reflux disease)    Hypertension 2011   NO (nasal obstruction)    OSA (obstructive sleep apnea)     PAST SURGICAL HISTORY: Past Surgical History:  Procedure Laterality Date   BREAST BIOPSY Right 06/08/2024   US  RT BREAST BX W LOC DEV 1ST LESION IMG BX SPEC US  GUIDE 06/08/2024 [facility]   CESAREAN SECTION     x 2   CHOLECYSTECTOMY     DENTAL SURGERY Bilateral 04/2024   Removed both fang teeth on the top and wisdom as well.   HYSTEROSCOPY WITH D & C N/A 07/18/2024   Procedure: DILATATION AND CURETTAGE /HYSTEROSCOPY;  Surgeon: Leigh Sober, MD;  Location: [facility];  Service: Gynecology;  Laterality: N/A;   PORTACATH PLACEMENT Left 06/22/2024   Procedure: INSERTION, TUNNELED CENTRAL VENOUS DEVICE, WITH PORT;  Surgeon: Marinda Jayson KIDD, MD;  Location: [facility];  Service: General;  Laterality: Left;   TUBAL LIGATION      FAMILY HISTORY: Family History  Problem Relation Age of Onset   Heart disease Mother    Stroke Mother    Brain cancer Half-Brother        dx 30s   Breast cancer Half-Sister        dx 30s    ADVANCED DIRECTIVES (Y/N):  N  HEALTH MAINTENANCE: Social History   Tobacco Use   Smoking status: Never   Smokeless tobacco: Never  Vaping Use   Vaping status: Never Used  Substance Use Topics   Alcohol use: Not Currently    Alcohol/week: 1.0  standard drink of alcohol    Types: 1 Glasses of wine per week    Comment: occassionally    Drug use: Not Currently    Types: Crack cocaine     Colonoscopy:  PAP:  Bone density:  Lipid panel:  Allergies  Allergen Reactions   Egg Protein-Containing Drug Products Nausea And Vomiting   Milk-Related Compounds     Current Outpatient Medications  Medication Sig Dispense Refill   albuterol  (VENTOLIN  HFA) 108 (90 Base) MCG/ACT inhaler Inhale 2 puffs into the lungs every 6 (six) hours  as needed for wheezing or shortness of breath. 18 g 6   amLODipine  (NORVASC ) 10 MG tablet TAKE 1 TABLET BY MOUTH ONCE DAILY. TO LOWER BLOOD PRESSURE 90 tablet 3   atorvastatin  (LIPITOR) 20 MG tablet Take 1 tablet (20 mg total) by mouth daily. 90 tablet 1   Blood Pressure Monitor KIT Use to check blood pressure daily 1 kit 0   budesonide -formoterol  (SYMBICORT ) 80-4.5 MCG/ACT inhaler Inhale 2 puffs into the lungs in the morning and at bedtime. 1 each 2   carvedilol  (COREG ) 6.25 MG tablet Take 1 tablet (6.25 mg total) by mouth 2 (two) times daily with a meal. 90 tablet 3   ibuprofen  (ADVIL ) 800 MG tablet Take 1 tablet (800 mg total) by mouth every 8 (eight) hours as needed for cramping. 15 tablet 0   levocetirizine (XYZAL ) 5 MG tablet Take 1 tablet (5 mg total) by mouth every evening. 90 tablet 3   lidocaine -prilocaine  (EMLA ) cream Apply to affected area once 30 g 3   Misc. Devices MISC CPAP therapy on 17 cm H2O . Patient to use X-Small size Resmed Full Face Mask AirFit F10. Diagnosis - Obstructive sleep apnea 1 each 0   neomycin-polymyxin b-dexamethasone  (MAXITROL) 3.5-10000-0.1 SUSP SMARTSIG:In Eye(s)     nitroGLYCERIN  (NITROSTAT ) 0.4 MG SL tablet Place 1 tablet (0.4 mg total) under the tongue every 5 (five) minutes as needed for chest pain. 20 tablet 1   omeprazole  (PRILOSEC  OTC) 20 MG tablet Take 1 tablet (20 mg total) by mouth daily. 28 tablet 1   omeprazole  (PRILOSEC ) 20 MG capsule Take 20 mg by mouth daily.     ondansetron  (ZOFRAN ) 8 MG tablet Take 1 tablet (8 mg total) by mouth every 8 (eight) hours as needed for nausea or vomiting. 30 tablet 3   oxyCODONE -acetaminophen  (PERCOCET/ROXICET) 5-325 MG tablet Take 1 tablet by mouth every 8 (eight) hours as needed for severe pain (pain score 7-10). 3 tablet 0   prochlorperazine  (COMPAZINE ) 10 MG tablet Take 1 tablet (10 mg total) by mouth every 6 (six) hours as needed for nausea or vomiting. 30 tablet 3   valsartan  (DIOVAN ) 320 MG tablet Take 1  tablet (320 mg total) by mouth daily. 90 tablet 3   Vitamin D , Ergocalciferol , (DRISDOL ) 1.25 MG (50000 UNIT) CAPS capsule Take 1 capsule (50,000 Units total) by mouth every 7 (seven) days. 12 capsule 1   No current facility-administered medications for this visit.    OBJECTIVE: Vitals:   07/26/24 0926  BP: 132/74  Pulse: 80  Resp: 18  Temp: 97.9 F (36.6 C)  SpO2: 100%     Body mass index is 32.61 kg/m.    ECOG FS:0 - Asymptomatic  General: Well-developed, well-nourished, no acute distress. Eyes: Pink conjunctiva, anicteric sclera. HEENT: Normocephalic, moist mucous membranes. Lungs: No audible wheezing or coughing. Heart: Regular rate and rhythm. Abdomen: Soft, nontender, no obvious distention. Musculoskeletal: No edema, cyanosis, or clubbing. Neuro: Alert, answering  all questions appropriately. Cranial nerves grossly intact. Skin: No rashes or petechiae noted. Psych: Normal affect.  LAB RESULTS:  Lab Results  Component Value Date   NA 142 07/26/2024   K 3.9 07/26/2024   CL 105 07/26/2024   CO2 26 07/26/2024   GLUCOSE 114 (H) 07/26/2024   BUN 10 07/26/2024   CREATININE 0.81 07/26/2024   CALCIUM  9.3 07/26/2024   PROT 7.1 07/26/2024   ALBUMIN 4.3 07/26/2024   AST 17 07/26/2024   ALT 14 07/26/2024   ALKPHOS 118 07/26/2024   BILITOT 0.2 07/26/2024   GFRNONAA >60 07/26/2024   GFRAA 82 01/05/2020    Lab Results  Component Value Date   WBC 15.2 (H) 07/26/2024   NEUTROABS 10.7 (H) 07/26/2024   HGB 9.0 (L) 07/26/2024   HCT 28.0 (L) 07/26/2024   MCV 91.8 07/26/2024   PLT 366 07/26/2024     STUDIES: ECHOCARDIOGRAM COMPLETE Result Date: 06/27/2024    ECHOCARDIOGRAM REPORT   Patient Name:   DEMIYAH FISCHBACH Date of Exam: 06/27/2024 Medical Rec #:  [id number]          Height:       64.0 in Accession #:    [id number]         Weight:       190.7 lb Date of Birth:  August 29, 1965         BSA:          1.917 m Patient Age:    57 years           BP:           129/73 mmHg  Patient Gender: F                  HR:           73 bpm. Exam Location:  [facility] Procedure: 2D Echo, Cardiac Doppler and Color Doppler (Both Spectral and Color            Flow Doppler were utilized during procedure). Indications:     Elevated troponin  History:         Patient has prior history of Echocardiogram examinations, most                  recent 11/11/2023. COPD; Risk Factors:Hypertension and Sleep                  Apnea.  Sonographer:     Christopher Furnace Referring Phys:  [id number] DELAYNE LULLA SOLIAN Diagnosing Phys: Deatrice Cage MD  Sonographer Comments: Technically difficult study due to poor echo windows, no apical window and no subcostal window. Image acquisition challenging due to COPD. IMPRESSIONS  1. Left ventricular ejection fraction, by estimation, is 60 to 65%. The left ventricle has normal function. The left ventricle has no regional wall motion abnormalities. There is mild left ventricular hypertrophy. Left ventricular diastolic function could not be evaluated.  2. Right ventricular systolic function is normal. The right ventricular size is normal. Tricuspid regurgitation signal is inadequate for assessing PA pressure.  3. Left atrial size was mildly dilated.  4. The mitral valve is normal in structure. No evidence of mitral valve regurgitation. No evidence of mitral stenosis.  5. The aortic valve is normal in structure. Aortic valve regurgitation is not visualized. No aortic stenosis is present. FINDINGS  Left Ventricle: Left ventricular ejection fraction, by estimation, is 60 to 65%. The left ventricle has normal function. The left ventricle has no regional wall motion abnormalities. The  left ventricular internal cavity size was normal in size. There is  mild left ventricular hypertrophy. Left ventricular diastolic function could not be evaluated. Right Ventricle: The right ventricular size is normal. No increase in right ventricular wall thickness. Right ventricular systolic function is normal. Tricuspid  regurgitation signal is inadequate for assessing PA pressure. Left Atrium: Left atrial size was mildly dilated. Right Atrium: Right atrial size was normal in size. Pericardium: There is no evidence of pericardial effusion. Mitral Valve: The mitral valve is normal in structure. No evidence of mitral valve regurgitation. No evidence of mitral valve stenosis. Tricuspid Valve: The tricuspid valve is normal in structure. Tricuspid valve regurgitation is not demonstrated. No evidence of tricuspid stenosis. Aortic Valve: The aortic valve is normal in structure. Aortic valve regurgitation is not visualized. No aortic stenosis is present. Pulmonic Valve: The pulmonic valve was normal in structure. Pulmonic valve regurgitation is not visualized. No evidence of pulmonic stenosis. Aorta: The aortic root is normal in size and structure. Venous: The inferior vena cava was not well visualized. IAS/Shunts: No atrial level shunt detected by color flow Doppler.  LEFT VENTRICLE PLAX 2D LVIDd:         3.55 cm LVIDs:         2.13 cm LV PW:         1.10 cm LV IVS:        1.11 cm LVOT diam:     1.30 cm LVOT Area:     1.33 cm  LEFT ATRIUM         Index LA diam:    4.10 cm 2.14 cm/m   AORTA Ao Root diam: 2.50 cm  SHUNTS Systemic Diam: 1.30 cm Deatrice Cage MD Electronically signed by Deatrice Cage MD Signature Date/Time: 06/27/2024/11:24:36 AM    Final     ASSESSMENT: Clinical stage IIb ER positive, PR/HER2 negative invasive carcinoma of the right breast.  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  After lengthy discussion with the patient, she agreed to proceed with neoadjuvant chemotherapy with Adriamycin  and Cytoxan  x 4 with GCF support followed by weekly Taxol x 12.  This will then be followed by lumpectomy and adjuvant XRT.  After completion of all her treatments, she will benefit from 5 years of  letrozole given the ER positivity.  MUGA scan from June 24, 2024 revealed an EF of 57%.  Patient has had port placement.  Proceed with cycle 3 of Adriamycin  and Cytoxan  today.  Return to clinic in 2 days for G-CSF support and then in 2 weeks for further evaluation and consideration of cycle 4.  Patient will receive cycle 1 of Taxol 2 weeks after the completion of her dose dense AC.   Chest pain/palpitations: Resolved.  Patient was previously given a referral to cardiology.   Hyponatremia: Resolved. Hypokalemia: Resolved. Leukocytosis: Likely reactive from G-CSF support. Anemia: Hemoglobin is trended down to 9.0, monitor.   The entire visit was done in the presence of an ASL interpreter.  Patient expressed understanding and was in agreement with this plan. She also understands that She can call clinic at any time with any questions, concerns, or complaints.    Cancer Staging  Invasive ductal carcinoma of right breast [facility]) Staging form: Breast, AJCC 8th Edition - Clinical stage from 06/16/2024: Stage IIB (cT2, cN0, cM0, G3, ER+, PR-, HER2-) - Signed by Jacobo Evalene PARAS, MD  on 06/16/2024 Stage prefix: Initial diagnosis Histologic grading system: 3 grade system   Evalene JINNY Reusing, MD   07/26/2024 11:15 PM

## 2024-07-26 NOTE — Progress Notes (Signed)
 Patient is wanting to discuss exactly how many more treatments she has until being done?  Patient is doing great, no symptoms or side effects from her treatment besides a little bit of soreness.

## 2024-07-28 ENCOUNTER — Inpatient Hospital Stay

## 2024-07-28 DIAGNOSIS — Z5111 Encounter for antineoplastic chemotherapy: Secondary | ICD-10-CM | POA: Diagnosis not present

## 2024-07-28 DIAGNOSIS — C50911 Malignant neoplasm of unspecified site of right female breast: Secondary | ICD-10-CM

## 2024-07-28 MED ORDER — PEGFILGRASTIM-CBQV 6 MG/0.6ML ~~LOC~~ SOSY
6.0000 mg | PREFILLED_SYRINGE | Freq: Once | SUBCUTANEOUS | Status: AC
  Administered 2024-07-28: 6 mg via SUBCUTANEOUS
  Filled 2024-07-28: qty 0.6

## 2024-07-28 NOTE — Progress Notes (Signed)
 Nutrition Assessment   Reason for Assessment:  New chemotherapy   ASSESSMENT:  58 year old female with stage II ER + PR/HER2 negative breast cancer.  Followed by Dr Jacobo.  Past medical history of COPD, deaf, GERD, HTN, asthma.  Patient receiving neoadjuvant AC chemotherapy followed by taxol, then lumpectomy, followed by radiation and AI  Met with patient in clinic.  Utilized video interpreter [place] [phone] during entire visit.  Patient reports that she is eating less volume of food than before but seems her weight is going up.  Reports ate 1 pancake yesterday morning but usually will eat 2.  Also had a corndog yesterday.  Also had chicken patty with broccoli and zucchini yesterday.  Has also been eating salad, potato chips and dip, eggs, turkey, ham, chicken, vegetables, bread, fruit, deviled eggs.  Drinks water, apple juice, sprite, peach tea.  Has had some nausea, soft bowel movement.     Medications: Vit D, compazine , prilosec , zofran    Labs: reviewed   Anthropometrics:   Height: 64 inches Weight: 190 lb on 12/1 UBW: 187-192 lb BMI: 32  Weight stable   NUTRITION DIAGNOSIS: None at this time   INTERVENTION:  Discussed nutrition during cancer treatment.  Handout provided Encouraged lean protein foods and plant foods Encouraged sugar free beverages Contact information provided   MONITORING, EVALUATION, GOAL: weight trends, intake   Next Visit: Tuesday, Jan 13 during infusion  Teriann Livingood B. Dasie SOLON, CSO, LDN Registered Dietitian [phone]

## 2024-07-29 ENCOUNTER — Other Ambulatory Visit: Payer: Self-pay

## 2024-07-29 NOTE — Progress Notes (Unsigned)
° ° °  OBSTETRICS/GYNECOLOGY POST-OPERATIVE CLINIC VISIT  Subjective:    This encounter was facilitated by live ASL language interpreter.   Caitlin [name] is a 58 y.o. female who presents to the clinic 2 weeks status post hysteroscopy and D&C for postmenopausal bleeding, thickened endometrium, and endometrial polyp. Eating a regular diet without difficulty. Bowel movements are normal. The patient is not having any pain.  The following portions of the patient's history were reviewed and updated as appropriate: allergies, current medications, past family history, past medical history, past social history, past surgical history, and problem list.  Review of Systems Pertinent items are noted in HPI.   Objective:   BP 128/69   Pulse 64   Ht 5' 4 (1.626 m)   Wt 189 lb 9.6 oz (86 kg)   LMP 05/08/2016   BMI 32.54 kg/m  Body mass index is 32.54 kg/m.  General:  alert and no distress  Abdomen: soft, bowel sounds active, non-tender  Incision:   N/A    Pathology:  FINAL DIAGNOSIS        1. Endometrium, curettage,  :       PREDOMINANT ENDOCERVICAL MUCOSA WITH MINUTE FRAGMENTS OF ENDOMETRIAL STROMA.       FRAGMENTS OF SMOOTH MUSCLE COMPATIBLE WITH LEIOMYOMA.       NEGATIVE FOR MALIGNANCY.        2. Uterine Contents, intra-uterine mass :       FRAGMENTS OF SMOOTH MUSCLE COMPATIBLE WITH LEIOMYOMA.       FOCAL BENIGN INACTIVE ENDOMETRIUM.       NEGATIVE FOR MALIGNANCY.   Assessment:   58 y.o. G3P3003 s/p HSC w/D&C for PMB, pathology c/w leiomyoma and negative for malignancy. Pt is doing well without complaints or acute concerns.    Plan:   1. Continue any current medications as instructed by provider. 2. Wound care discussed. 3. Operative findings again reviewed. Pathology report discussed. Photos reviewed.  4. Activity restrictions: none 5. Anticipated return to work: not applicable. 6. Follow up: prn; does annuals with PCP, has oncology appt for breast cancer coming  up   Estil Mangle, DO [facility]

## 2024-08-03 ENCOUNTER — Telehealth: Payer: Self-pay | Admitting: Licensed Clinical Social Worker

## 2024-08-03 ENCOUNTER — Ambulatory Visit: Payer: Self-pay | Admitting: Licensed Clinical Social Worker

## 2024-08-03 ENCOUNTER — Encounter: Payer: Self-pay | Admitting: Obstetrics

## 2024-08-03 ENCOUNTER — Ambulatory Visit: Admitting: Obstetrics

## 2024-08-03 ENCOUNTER — Encounter: Payer: Self-pay | Admitting: Licensed Clinical Social Worker

## 2024-08-03 VITALS — BP 128/69 | HR 64 | Ht 64.0 in | Wt 189.6 lb

## 2024-08-03 DIAGNOSIS — Z1379 Encounter for other screening for genetic and chromosomal anomalies: Secondary | ICD-10-CM | POA: Insufficient documentation

## 2024-08-03 DIAGNOSIS — D259 Leiomyoma of uterus, unspecified: Secondary | ICD-10-CM

## 2024-08-03 DIAGNOSIS — D219 Benign neoplasm of connective and other soft tissue, unspecified: Secondary | ICD-10-CM

## 2024-08-03 DIAGNOSIS — Z4889 Encounter for other specified surgical aftercare: Secondary | ICD-10-CM

## 2024-08-03 NOTE — Telephone Encounter (Signed)
 I contacted Ms. Nulty to discuss her genetic testing results. No pathogenic variants were identified in the 48 genes analyzed. Detailed clinic note to follow.   The test report has been scanned into EPIC and is located under the Molecular Pathology section of the Results Review tab.  A portion of the result report is included below for reference.     Dena Cary, MS, [facility] Genetic Counselor [email or web address] Phone: [phone]

## 2024-08-03 NOTE — Progress Notes (Signed)
 HPI:   Ms. Friedhoff was previously seen in the [facility] due to a personal and family history of breast cancer and concerns regarding a hereditary predisposition to cancer. Please refer to our prior cancer genetics clinic note for more information regarding our discussion, assessment and recommendations, at the time. Ms. Paulette recent genetic test results were disclosed to her, as were recommendations warranted by these results. These results and recommendations are discussed in more detail below.  CANCER HISTORY:  Oncology History  Invasive ductal carcinoma of right breast (HCC)  06/16/2024 Initial Diagnosis   Invasive ductal carcinoma of right breast (HCC)   06/16/2024 Cancer Staging   Staging form: Breast, AJCC 8th Edition - Clinical stage from 06/16/2024: Stage IIB (cT2, cN0, cM0, G3, ER+, PR-, HER2-) - Signed by Jacobo Evalene PARAS, MD on 06/16/2024 Stage prefix: Initial diagnosis Histologic grading system: 3 grade system   06/28/2024 -  Chemotherapy   Patient is on Treatment Plan : BREAST DOSE DENSE AC q14d / PACLitaxel q7d     08/03/2024 Genetic Testing   Negative genetic testing. No pathogenic variants identified on the [facility] Common Hereditary Cancers+RNA Panel. The report date is 08/03/2024.  The Common Hereditary Cancers Panel + RNA offered by [facility] includes sequencing and/or deletion duplication testing of the following 48 genes: APC*, ATM*, AXIN2, BAP1, BARD1, BMPR1A, BRCA1, BRCA2, BRIP1, CDH1, CDK4, CDKN2A (p14ARF), CDKN2A (p16INK4a), CHEK2, CTNNA1, DICER1*, EPCAM*, FH*, GREM1*, HOXB13, KIT, MBD4, MEN1*, MLH1*, MSH2*, MSH3*, MSH6*, MUTYH, NF1*, NTHL1, PALB2, PDGFRA, PMS2*, POLD1*, POLE, PTEN*, RAD51C, RAD51D, SDHA*, SDHB, SDHC*, SDHD, SMAD4, SMARCA4, STK11, TP53, TSC1*, TSC2, VHL.      FAMILY HISTORY:  We obtained a detailed, 4-generation family history.  Significant diagnoses are listed below: Family History  Problem Relation Age of Onset    Heart disease Mother    Stroke Mother    Brain cancer Half-Brother        dx 30s   Breast cancer Half-Sister        dx 39s   Ms. Dawn has 2 daughters, 71 and 7, and 1 son, 71. She has 2 maternal half brothers, 1 paternal half sister and 1 paternal half brother. Her paternal half sister had breast cancer in her 30s and passed. Her paternal half brother had brain cancer in his 30s and passed.    Ms. Stierwalt has limited information about paternal/maternal relatives, no other known cancers in the family.   Ms. Codd is unaware of previous family history of genetic testing for hereditary cancer risks. There is no reported Ashkenazi Jewish ancestry. There is no known consanguinity.    GENETIC TEST RESULTS:  The [facility] Common Hereditary Cancers+RNA Panel found no pathogenic mutations.   The Common Hereditary Cancers Panel + RNA offered by [facility] includes sequencing and/or deletion duplication testing of the following 48 genes: APC*, ATM*, AXIN2, BAP1, BARD1, BMPR1A, BRCA1, BRCA2, BRIP1, CDH1, CDK4, CDKN2A (p14ARF), CDKN2A (p16INK4a), CHEK2, CTNNA1, DICER1*, EPCAM*, FH*, GREM1*, HOXB13, KIT, MBD4, MEN1*, MLH1*, MSH2*, MSH3*, MSH6*, MUTYH, NF1*, NTHL1, PALB2, PDGFRA, PMS2*, POLD1*, POLE, PTEN*, RAD51C, RAD51D, SDHA*, SDHB, SDHC*, SDHD, SMAD4, SMARCA4, STK11, TP53, TSC1*, TSC2, VHL.   The test report has been scanned into EPIC and is located under the Molecular Pathology section of the Results Review tab.  A portion of the result report is included below for reference. Genetic testing reported out on 08/03/2024.    Even though a pathogenic variant was not identified, possible explanations for the cancer in the family may include: There  may be no hereditary risk for cancer in the family. The cancers in Ms. Monnin and/or her family may be sporadic/familial or due to other genetic and environmental factors. There may be a gene mutation in one of these genes that current testing methods cannot detect  but that chance is small. There could be another gene that has not yet been discovered, or that we have not yet tested, that is responsible for the cancer diagnoses in the family.  It is also possible there is a hereditary cause for the cancer in the family that Ms. Marxen did not inherit.  Therefore, it is important to remain in touch with cancer genetics in the future so that we can continue to offer Ms. Arrowood the most up to date genetic testing.   ADDITIONAL GENETIC TESTING:  We discussed with Ms. Burnette that her genetic testing was fairly extensive.  If there are additional relevant genes identified to increase cancer risk that can be analyzed in the future, we would be happy to discuss and coordinate this testing at that time.    CANCER SCREENING RECOMMENDATIONS:  Ms. Flury test result is considered negative (normal).  This means that we have not identified a hereditary cause for her personal and family history of cancer at this time.   An individual's cancer risk and medical management are not determined by genetic test results alone. Overall cancer risk assessment incorporates additional factors, including personal medical history, family history, and any available genetic information that may result in a personalized plan for cancer prevention and surveillance. Therefore, it is recommended she continue to follow the cancer management and screening guidelines provided by her oncology and primary healthcare provider.  RECOMMENDATIONS FOR FAMILY MEMBERS:   Since she did not inherit a identifiable mutation in a cancer predisposition gene included on this panel, her children could not have inherited a known mutation from her in one of these genes. Individuals in this family might be at some increased risk of developing cancer, over the general population risk, due to the family history of cancer.  Individuals in the family should notify their providers of the family history of cancer. We  recommend women in this family have a yearly mammogram beginning at age 51, or 96 years younger than the earliest onset of cancer, an annual clinical breast exam, and perform monthly breast self-exams.  Family members should have colonoscopies by at age 64, or earlier, as recommended by their providers. Other members of the family may still carry a pathogenic variant in one of these genes that Ms. Guice did not inherit. Based on the family history, we recommend those related to her sister who had breast cancer in her 30s have genetic counseling and testing. Ms. Gilmore will let us  know if we can be of any assistance in coordinating genetic counseling and/or testing for this family member.    FOLLOW-UP:  Lastly, we discussed with Ms. Brooking that cancer genetics is a rapidly advancing field and it is possible that new genetic tests will be appropriate for her and/or her family members in the future. We encouraged her to remain in contact with cancer genetics on an annual basis so we can update her personal and family histories and let her know of advances in cancer genetics that may benefit this family.   Our contact number was provided. Ms. Dejarnett questions were answered to her satisfaction, and she knows she is welcome to call us  at anytime with additional questions or concerns.  Dena Cary, MS, [facility] Genetic Counselor [email or web address] Phone: [phone]

## 2024-08-04 DIAGNOSIS — D219 Benign neoplasm of connective and other soft tissue, unspecified: Secondary | ICD-10-CM | POA: Insufficient documentation

## 2024-08-05 MED FILL — Fosaprepitant Dimeglumine For IV Infusion 150 MG (Base Eq): INTRAVENOUS | Qty: 5 | Status: AC

## 2024-08-08 ENCOUNTER — Inpatient Hospital Stay

## 2024-08-08 ENCOUNTER — Encounter: Payer: Self-pay | Admitting: Nurse Practitioner

## 2024-08-08 ENCOUNTER — Inpatient Hospital Stay: Admitting: Nurse Practitioner

## 2024-08-08 VITALS — BP 130/86 | HR 75 | Temp 98.7°F | Ht 64.0 in | Wt 189.0 lb

## 2024-08-08 VITALS — BP 122/67 | HR 73

## 2024-08-08 DIAGNOSIS — D649 Anemia, unspecified: Secondary | ICD-10-CM

## 2024-08-08 DIAGNOSIS — C50911 Malignant neoplasm of unspecified site of right female breast: Secondary | ICD-10-CM

## 2024-08-08 DIAGNOSIS — Z5111 Encounter for antineoplastic chemotherapy: Secondary | ICD-10-CM | POA: Diagnosis not present

## 2024-08-08 DIAGNOSIS — C50411 Malignant neoplasm of upper-outer quadrant of right female breast: Secondary | ICD-10-CM

## 2024-08-08 LAB — CBC WITH DIFFERENTIAL (CANCER CENTER ONLY)
Abs Immature Granulocytes: 1.54 K/uL — ABNORMAL HIGH (ref 0.00–0.07)
Basophils Absolute: 0 K/uL (ref 0.0–0.1)
Basophils Relative: 0 %
Eosinophils Absolute: 0 K/uL (ref 0.0–0.5)
Eosinophils Relative: 0 %
HCT: 22.8 % — ABNORMAL LOW (ref 36.0–46.0)
Hemoglobin: 7.4 g/dL — ABNORMAL LOW (ref 12.0–15.0)
Immature Granulocytes: 13 %
Lymphocytes Relative: 5 %
Lymphs Abs: 0.7 K/uL (ref 0.7–4.0)
MCH: 30.5 pg (ref 26.0–34.0)
MCHC: 32.5 g/dL (ref 30.0–36.0)
MCV: 93.8 fL (ref 80.0–100.0)
Monocytes Absolute: 1.2 K/uL — ABNORMAL HIGH (ref 0.1–1.0)
Monocytes Relative: 10 %
Neutro Abs: 8.7 K/uL — ABNORMAL HIGH (ref 1.7–7.7)
Neutrophils Relative %: 72 %
Platelet Count: 269 K/uL (ref 150–400)
RBC: 2.43 MIL/uL — ABNORMAL LOW (ref 3.87–5.11)
RDW: 15.9 % — ABNORMAL HIGH (ref 11.5–15.5)
WBC Count: 12.1 K/uL — ABNORMAL HIGH (ref 4.0–10.5)
nRBC: 0.7 % — ABNORMAL HIGH (ref 0.0–0.2)

## 2024-08-08 LAB — CMP (CANCER CENTER ONLY)
ALT: 11 U/L (ref 0–44)
AST: 15 U/L (ref 15–41)
Albumin: 4.1 g/dL (ref 3.5–5.0)
Alkaline Phosphatase: 116 U/L (ref 38–126)
Anion gap: 10 (ref 5–15)
BUN: 12 mg/dL (ref 6–20)
CO2: 25 mmol/L (ref 22–32)
Calcium: 9.1 mg/dL (ref 8.9–10.3)
Chloride: 106 mmol/L (ref 98–111)
Creatinine: 0.69 mg/dL (ref 0.44–1.00)
GFR, Estimated: >60 mL/min
Glucose, Bld: 117 mg/dL — ABNORMAL HIGH (ref 70–99)
Potassium: 3.7 mmol/L (ref 3.5–5.1)
Sodium: 141 mmol/L (ref 135–145)
Total Bilirubin: 0.3 mg/dL (ref 0.0–1.2)
Total Protein: 6.8 g/dL (ref 6.5–8.1)

## 2024-08-08 LAB — VITAMIN B12: Vitamin B-12: >4000 pg/mL — ABNORMAL HIGH (ref 180–914)

## 2024-08-08 LAB — FERRITIN: Ferritin: 565 ng/mL — ABNORMAL HIGH (ref 11–307)

## 2024-08-08 LAB — IRON AND TIBC
Iron: 64 ug/dL (ref 28–170)
Saturation Ratios: 26 % (ref 10.4–31.8)
TIBC: 249 ug/dL — ABNORMAL LOW (ref 250–450)
UIBC: 185 ug/dL

## 2024-08-08 LAB — FOLATE: Folate: 6.6 ng/mL

## 2024-08-08 LAB — PREPARE RBC (CROSSMATCH)

## 2024-08-08 LAB — ABO/RH: ABO/RH(D): O POS

## 2024-08-08 MED ORDER — SODIUM CHLORIDE 0.9 % IV SOLN
150.0000 mg | Freq: Once | INTRAVENOUS | Status: AC
  Administered 2024-08-08: 150 mg via INTRAVENOUS
  Filled 2024-08-08: qty 150

## 2024-08-08 MED ORDER — DEXAMETHASONE SOD PHOSPHATE PF 10 MG/ML IJ SOLN
10.0000 mg | Freq: Once | INTRAMUSCULAR | Status: AC
  Administered 2024-08-08: 10 mg via INTRAVENOUS

## 2024-08-08 MED ORDER — SODIUM CHLORIDE 0.9 % IV SOLN
INTRAVENOUS | Status: DC
  Filled 2024-08-08: qty 250

## 2024-08-08 MED ORDER — SODIUM CHLORIDE 0.9 % IV SOLN
600.0000 mg/m2 | Freq: Once | INTRAVENOUS | Status: AC
  Administered 2024-08-08: 1180 mg via INTRAVENOUS
  Filled 2024-08-08: qty 59

## 2024-08-08 MED ORDER — DOXORUBICIN HCL CHEMO IV INJECTION 2 MG/ML
60.0000 mg/m2 | Freq: Once | INTRAVENOUS | Status: AC
  Administered 2024-08-08: 118 mg via INTRAVENOUS
  Filled 2024-08-08: qty 59

## 2024-08-08 MED ORDER — PALONOSETRON HCL INJECTION 0.25 MG/5ML
0.2500 mg | Freq: Once | INTRAVENOUS | Status: AC
  Administered 2024-08-08: 0.25 mg via INTRAVENOUS
  Filled 2024-08-08: qty 5

## 2024-08-08 NOTE — Patient Instructions (Signed)
 [facility] - A DEPT OF [facility]  Discharge Instructions: Thank you for choosing [name] to provide your oncology and hematology care.  If you have a lab appointment with the [name], please go directly to the [name] and check in at the registration area.  Wear comfortable clothing and clothing appropriate for easy access to any Portacath or PICC line.   We strive to give you quality time with your provider. You may need to reschedule your appointment if you arrive late (15 or more minutes).  Arriving late affects you and other patients whose appointments are after yours.  Also, if you miss three or more appointments without notifying the office, you may be dismissed from the clinic at the provider's discretion.      For prescription refill requests, have your pharmacy contact our office and allow 72 hours for refills to be completed.    Today you received the following chemotherapy and/or immunotherapy agents ADRIAMYACIN and CYTOXAN      To help prevent nausea and vomiting after your treatment, we encourage you to take your nausea medication as directed.  BELOW ARE SYMPTOMS THAT SHOULD BE REPORTED IMMEDIATELY: *FEVER GREATER THAN 100.4 F (38 C) OR HIGHER *CHILLS OR SWEATING *NAUSEA AND VOMITING THAT IS NOT CONTROLLED WITH YOUR NAUSEA MEDICATION *UNUSUAL SHORTNESS OF BREATH *UNUSUAL BRUISING OR BLEEDING *URINARY PROBLEMS (pain or burning when urinating, or frequent urination) *BOWEL PROBLEMS (unusual diarrhea, constipation, pain near the anus) TENDERNESS IN MOUTH AND THROAT WITH OR WITHOUT PRESENCE OF ULCERS (sore throat, sores in mouth, or a toothache) UNUSUAL RASH, SWELLING OR PAIN  UNUSUAL VAGINAL DISCHARGE OR ITCHING   Items with * indicate a potential emergency and should be followed up as soon as possible or go to the Emergency Department if any problems should occur.  Please show the CHEMOTHERAPY ALERT CARD or  IMMUNOTHERAPY ALERT CARD at check-in to the Emergency Department and triage nurse.  Should you have questions after your visit or need to cancel or reschedule your appointment, please contact [facility] - A DEPT OF [name]  [phone] and follow the prompts.  Office hours are 8:00 a.m. to 4:30 p.m. Monday - Friday. Please note that voicemails left after 4:00 p.m. may not be returned until the following business day.  We are closed weekends and major holidays. You have access to a nurse at all times for urgent questions. Please call the main number to the clinic [phone] and follow the prompts.  For any non-urgent questions, you may also contact your provider using MyChart. We now offer e-Visits for anyone 43 and older to request care online for non-urgent symptoms. For details visit [email or web address].   Also download the MyChart app! Go to the app store, search MyChart, open the app, select [facility], and log in with your MyChart username and password.  Doxorubicin Injection What is this medication? DOXORUBICIN (dox oh ROO bi sin) treats some types of cancer. It works by slowing down the growth of cancer cells. This medicine may be used for other purposes; ask your health care provider or pharmacist if you have questions. COMMON BRAND NAME(S): Adriamycin, Adriamycin PFS, Adriamycin RDF, Rubex What should I tell my care team before I take this medication? They need to know if you have any of these conditions: Heart disease History of low blood cell levels caused by a medication Liver disease Recent or ongoing radiation  An unusual or allergic reaction to doxorubicin, other medications, foods, dyes, or preservatives If you or your partner are pregnant or trying to get pregnant Breast-feeding How should I use this medication? This medication is injected into a vein. It is given by your care team in a hospital or clinic setting. Talk to your  care team about the use of this medication in children. Special care may be needed. Overdosage: If you think you have taken too much of this medicine contact a poison control [name] or emergency room at once. NOTE: This medicine is only for you. Do not share this medicine with others. What if I miss a dose? Keep appointments for follow-up doses. It is important not to miss your dose. Call your care team if you are unable to keep an appointment. What may interact with this medication? 6-mercaptopurine Paclitaxel Phenytoin St. John's wort Trastuzumab Verapamil This list may not describe all possible interactions. Give your health care provider a list of all the medicines, herbs, non-prescription drugs, or dietary supplements you use. Also tell them if you smoke, drink alcohol, or use illegal drugs. Some items may interact with your medicine. What should I watch for while using this medication? Your condition will be monitored carefully while you are receiving this medication. You may need blood work while taking this medication. This medication may make you feel generally unwell. This is not uncommon as chemotherapy can affect healthy cells as well as cancer cells. Report any side effects. Continue your course of treatment even though you feel ill unless your care team tells you to stop. There is a maximum amount of this medication you should receive throughout your life. The amount depends on the medical condition being treated and your overall health. Your care team will watch how much of this medication you receive. Tell your care team if you have taken this medication before. Your urine may turn red for a few days after your dose. This is not blood. If your urine is dark or brown, call your care team. In some cases, you may be given additional medications to help with side effects. Follow all directions for their use. This medication may increase your risk of getting an infection. Call your care  team for advice if you get a fever, chills, sore throat, or other symptoms of a cold or flu. Do not treat yourself. Try to avoid being around people who are sick. This medication may increase your risk to bruise or bleed. Call your care team if you notice any unusual bleeding. Talk to your care team about your risk of cancer. You may be more at risk for certain types of cancers if you take this medication. Talk to your care team if you or your partner may be pregnant. Serious birth defects can occur if you take this medication during pregnancy and for 6 months after the last dose. Contraception is recommended while taking this medication and for 6 months after the last dose. Your care team can help you find the option that works for you. If your partner can get pregnant, use a condom while taking this medication and for 6 months after the last dose. Do not breastfeed while taking this medication. This medication may cause infertility. Talk to your care team if you are concerned about your fertility. What side effects may I notice from receiving this medication? Side effects that you should report to your care team as soon as possible: Allergic reactions--skin rash, itching, hives, swelling of the  face, lips, tongue, or throat Heart failure--shortness of breath, swelling of the ankles, feet, or hands, sudden weight gain, unusual weakness or fatigue Heart rhythm changes--fast or irregular heartbeat, dizziness, feeling faint or lightheaded, chest pain, trouble breathing Infection--fever, chills, cough, sore throat, wounds that don't heal, pain or trouble when passing urine, general feeling of discomfort or being unwell Low red blood cell level--unusual weakness or fatigue, dizziness, headache, trouble breathing Painful swelling, warmth, or redness of the skin, blisters or sores at the infusion site Unusual bruising or bleeding Side effects that usually do not require medical attention (report to your  care team if they continue or are bothersome): Diarrhea Hair loss Nausea Pain, redness, or swelling with sores inside the mouth or throat Red urine This list may not describe all possible side effects. Call your doctor for medical advice about side effects. You may report side effects to FDA at 1-800-FDA-1088. Where should I keep my medication? This medication is given in a hospital or clinic. It will not be stored at home. NOTE: This sheet is a summary. It may not cover all possible information. If you have questions about this medicine, talk to your doctor, pharmacist, or health care provider.  2024 Elsevier/Gold Standard (2022-11-06 00:00:00)   Cyclophosphamide Injection What is this medication? CYCLOPHOSPHAMIDE (sye kloe FOSS fa mide) treats some types of cancer. It works by slowing down the growth of cancer cells. This medicine may be used for other purposes; ask your health care provider or pharmacist if you have questions. COMMON BRAND NAME(S): Cyclophosphamide, Cytoxan, Neosar What should I tell my care team before I take this medication? They need to know if you have any of these conditions: Heart disease Irregular heartbeat or rhythm Infection Kidney problems Liver disease Low blood cell levels (white cells, platelets, or red blood cells) Lung disease Previous radiation Trouble passing urine An unusual or allergic reaction to cyclophosphamide, other medications, foods, dyes, or preservatives Pregnant or trying to get pregnant Breast-feeding How should I use this medication? This medication is injected into a vein. It is given by your care team in a hospital or clinic setting. Talk to your care team about the use of this medication in children. Special care may be needed. Overdosage: If you think you have taken too much of this medicine contact a poison control [name] or emergency room at once. NOTE: This medicine is only for you. Do not share this medicine with  others. What if I miss a dose? Keep appointments for follow-up doses. It is important not to miss your dose. Call your care team if you are unable to keep an appointment. What may interact with this medication? Amphotericin B Amiodarone Azathioprine Certain antivirals for HIV or hepatitis Certain medications for blood pressure, such as enalapril, lisinopril , quinapril Cyclosporine Diuretics Etanercept Indomethacin Medications that relax muscles Metronidazole  Natalizumab Tamoxifen Warfarin This list may not describe all possible interactions. Give your health care provider a list of all the medicines, herbs, non-prescription drugs, or dietary supplements you use. Also tell them if you smoke, drink alcohol, or use illegal drugs. Some items may interact with your medicine. What should I watch for while using this medication? This medication may make you feel generally unwell. This is not uncommon as chemotherapy can affect healthy cells as well as cancer cells. Report any side effects. Continue your course of treatment even though you feel ill unless your care team tells you to stop. You may need blood work while you are taking this medication.  This medication may increase your risk of getting an infection. Call your care team for advice if you get a fever, chills, sore throat, or other symptoms of a cold or flu. Do not treat yourself. Try to avoid being around people who are sick. Avoid taking medications that contain aspirin , acetaminophen , ibuprofen, naproxen , or ketoprofen unless instructed by your care team. These medications may hide a fever. Be careful brushing or flossing your teeth or using a toothpick because you may get an infection or bleed more easily. If you have any dental work done, tell your dentist you are receiving this medication. Drink water or other fluids as directed. Urinate often, even at night. Some products may contain alcohol. Ask your care team if this medication  contains alcohol. Be sure to tell all care teams you are taking this medicine. Certain medicines, like metronidazole  and disulfiram, can cause an unpleasant reaction when taken with alcohol. The reaction includes flushing, headache, nausea, vomiting, sweating, and increased thirst. The reaction can last from 30 minutes to several hours. Talk to your care team if you wish to become pregnant or think you might be pregnant. This medication can cause serious birth defects if taken during pregnancy and for 1 year after the last dose. A negative pregnancy test is required before starting this medication. A reliable form of contraception is recommended while taking this medication and for 1 year after the last dose. Talk to your care team about reliable forms of contraception. Do not father a child while taking this medication and for 4 months after the last dose. Use a condom during this time period. Do not breast-feed while taking this medication or for 1 week after the last dose. This medication may cause infertility. Talk to your care team if you are concerned about your fertility. Talk to your care team about your risk of cancer. You may be more at risk for certain types of cancer if you take this medication. What side effects may I notice from receiving this medication? Side effects that you should report to your care team as soon as possible: Allergic reactions--skin rash, itching, hives, swelling of the face, lips, tongue, or throat Dry cough, shortness of breath or trouble breathing Heart failure--shortness of breath, swelling of the ankles, feet, or hands, sudden weight gain, unusual weakness or fatigue Heart muscle inflammation--unusual weakness or fatigue, shortness of breath, chest pain, fast or irregular heartbeat, dizziness, swelling of the ankles, feet, or hands Heart rhythm changes--fast or irregular heartbeat, dizziness, feeling faint or lightheaded, chest pain, trouble  breathing Infection--fever, chills, cough, sore throat, wounds that don't heal, pain or trouble when passing urine, general feeling of discomfort or being unwell Kidney injury--decrease in the amount of urine, swelling of the ankles, hands, or feet Liver injury--right upper belly pain, loss of appetite, nausea, light-colored stool, dark yellow or brown urine, yellowing skin or eyes, unusual weakness or fatigue Low red blood cell level--unusual weakness or fatigue, dizziness, headache, trouble breathing Low sodium level--muscle weakness, fatigue, dizziness, headache, confusion Red or dark brown urine Unusual bruising or bleeding Side effects that usually do not require medical attention (report to your care team if they continue or are bothersome): Hair loss Irregular menstrual cycles or spotting Loss of appetite Nausea Pain, redness, or swelling with sores inside the mouth or throat Vomiting This list may not describe all possible side effects. Call your doctor for medical advice about side effects. You may report side effects to FDA at 1-800-FDA-1088. Where should I  keep my medication? This medication is given in a hospital or clinic. It will not be stored at home. NOTE: This sheet is a summary. It may not cover all possible information. If you have questions about this medicine, talk to your doctor, pharmacist, or health care provider.  2024 Elsevier/Gold Standard (2021-12-20 00:00:00)

## 2024-08-08 NOTE — Progress Notes (Signed)
 Patient states complaints of irritation and itching at her port site this morning, has been using Emla  cream. Additionally, reports feeling short of breath last Friday and had to use her inhaler.

## 2024-08-08 NOTE — Progress Notes (Signed)
 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  INTERVAL HISTORY: Patient returns to clinic today for repeat laboratory, further evaluation, and consideration of cycle 3 of neoadjuvant Adriamycin  and Cytoxan .  She continues to tolerate her treatments well without significant side effects.  She currently feels well and is asymptomatic.  She admits to myalgias with her G-CSF support, but these resolved within 24 to 48 hours.  She has no neurologic complaints.  She denies any recent fevers or illnesses.    Patient reports some increased fatigue this past week with intermittent shortness of breath.  She denies any cough, no congestion, no fever/chills.    REVIEW OF SYSTEMS:   Review of Systems  Constitutional:  Positive for malaise/fatigue. Negative for fever and weight loss.  Respiratory:  Positive for shortness of breath. Negative for cough and hemoptysis.   Cardiovascular: Negative.  Negative for chest pain and leg swelling.  Gastrointestinal: Negative.  Negative for abdominal pain.  Genitourinary: Negative.  Negative for dysuria.  Musculoskeletal: Negative.  Negative for back pain.  Skin: Negative.  Negative for rash.  Neurological: Negative.  Negative for dizziness, focal weakness and weakness.  Psychiatric/Behavioral: Negative.  The patient is not nervous/anxious.     As per HPI. Otherwise, a complete review of systems is negative.  PAST MEDICAL HISTORY: Past Medical History:  Diagnosis Date   Asthma    Cancer (HCC)    COPD (chronic obstructive pulmonary disease) (HCC)    Deaf    Endometrial  polyp    GERD (gastroesophageal reflux disease)    Hypertension 2011   NO (nasal obstruction)    OSA (obstructive sleep apnea)     PAST SURGICAL HISTORY: Past Surgical History:  Procedure Laterality Date   BREAST BIOPSY Right 06/08/2024   US  RT BREAST BX W LOC DEV 1ST LESION IMG BX SPEC US  GUIDE 06/08/2024 [facility]   CESAREAN SECTION     x 2   CHOLECYSTECTOMY     DENTAL SURGERY Bilateral 04/2024   Removed both fang teeth on the top and wisdom as well.   HYSTEROSCOPY WITH D & C N/A 07/18/2024   Procedure: DILATATION AND CURETTAGE /HYSTEROSCOPY;  Surgeon: Leigh Sober, MD;  Location: [facility];  Service: Gynecology;  Laterality: N/A;   PORTACATH PLACEMENT Left 06/22/2024   Procedure: INSERTION, TUNNELED CENTRAL VENOUS DEVICE, WITH PORT;  Surgeon: Marinda Jayson KIDD, MD;  Location: [facility];  Service: General;  Laterality: Left;   TUBAL LIGATION      FAMILY HISTORY: Family History  Problem Relation Age of Onset   Heart disease Mother    Stroke Mother    Brain cancer Half-Brother        dx 30s   Breast cancer Half-Sister        dx 30s    ADVANCED DIRECTIVES (Y/N):  N  HEALTH MAINTENANCE: Social History   Tobacco Use   Smoking status: Never   Smokeless tobacco: Never  Vaping Use   Vaping status: Never Used  Substance Use Topics   Alcohol use: Not Currently    Alcohol/week: 1.0 standard drink of alcohol    Types: 1 Glasses of wine per week  Comment: occassionally    Drug use: Not Currently    Types: Crack cocaine     Colonoscopy:  PAP:  Bone density:  Lipid panel:  Allergies  Allergen Reactions   Egg Protein-Containing Drug Products Nausea And Vomiting   Milk-Related Compounds     Current Outpatient Medications  Medication Sig Dispense Refill   albuterol  (VENTOLIN  HFA) 108 (90 Base) MCG/ACT inhaler Inhale 2 puffs into the lungs every 6 (six) hours as needed for wheezing or shortness of breath. 18 g 6   amLODipine  (NORVASC ) 10 MG tablet TAKE 1 TABLET  BY MOUTH ONCE DAILY. TO LOWER BLOOD PRESSURE 90 tablet 3   atorvastatin  (LIPITOR) 20 MG tablet Take 1 tablet (20 mg total) by mouth daily. 90 tablet 1   Blood Pressure Monitor KIT Use to check blood pressure daily 1 kit 0   budesonide -formoterol  (SYMBICORT ) 80-4.5 MCG/ACT inhaler Inhale 2 puffs into the lungs in the morning and at bedtime. 1 each 2   carvedilol  (COREG ) 6.25 MG tablet Take 1 tablet (6.25 mg total) by mouth 2 (two) times daily with a meal. 90 tablet 3   ibuprofen  (ADVIL ) 800 MG tablet Take 1 tablet (800 mg total) by mouth every 8 (eight) hours as needed for cramping. 15 tablet 0   levocetirizine (XYZAL ) 5 MG tablet Take 1 tablet (5 mg total) by mouth every evening. 90 tablet 3   lidocaine -prilocaine  (EMLA ) cream Apply to affected area once 30 g 3   Misc. Devices MISC CPAP therapy on 17 cm H2O . Patient to use X-Small size Resmed Full Face Mask AirFit F10. Diagnosis - Obstructive sleep apnea 1 each 0   neomycin-polymyxin b-dexamethasone  (MAXITROL) 3.5-10000-0.1 SUSP SMARTSIG:In Eye(s)     nitroGLYCERIN  (NITROSTAT ) 0.4 MG SL tablet Place 1 tablet (0.4 mg total) under the tongue every 5 (five) minutes as needed for chest pain. 20 tablet 1   omeprazole  (PRILOSEC  OTC) 20 MG tablet Take 1 tablet (20 mg total) by mouth daily. 28 tablet 1   omeprazole  (PRILOSEC ) 20 MG capsule Take 20 mg by mouth daily.     ondansetron  (ZOFRAN ) 8 MG tablet Take 1 tablet (8 mg total) by mouth every 8 (eight) hours as needed for nausea or vomiting. 30 tablet 3   oxyCODONE -acetaminophen  (PERCOCET/ROXICET) 5-325 MG tablet Take 1 tablet by mouth every 8 (eight) hours as needed for severe pain (pain score 7-10). 3 tablet 0   prochlorperazine  (COMPAZINE ) 10 MG tablet Take 1 tablet (10 mg total) by mouth every 6 (six) hours as needed for nausea or vomiting. 30 tablet 3   valsartan  (DIOVAN ) 320 MG tablet Take 1 tablet (320 mg total) by mouth daily. 90 tablet 3   Vitamin D , Ergocalciferol , (DRISDOL ) 1.25 MG (50000 UNIT)  CAPS capsule Take 1 capsule (50,000 Units total) by mouth every 7 (seven) days. 12 capsule 1   No current facility-administered medications for this visit.   Facility-Administered Medications Ordered in Other Visits  Medication Dose Route Frequency Provider Last Rate Last Admin   0.9 %  sodium chloride  infusion   Intravenous Continuous Jacobo Evalene PARAS, MD   Stopped at 08/08/24 1200    OBJECTIVE: Vitals:   08/08/24 0808  BP: 130/86  Pulse: 75  Temp: 98.7 F (37.1 C)  SpO2: 100%     Body mass index is 32.44 kg/m.    ECOG FS:0 - Asymptomatic  General: Well-developed, well-nourished, no acute distress. Eyes: Pink conjunctiva, anicteric sclera. HEENT: Normocephalic, moist mucous membranes. Lungs: No audible wheezing or coughing.  Heart: Regular rate and rhythm. Abdomen: Soft, nontender, no obvious distention. Musculoskeletal: No edema, cyanosis, or clubbing. Neuro: Alert, answering all questions appropriately. Cranial nerves grossly intact. Skin: No rashes or petechiae noted. Psych: Normal affect.  LAB RESULTS:  Lab Results  Component Value Date   NA 141 08/08/2024   K 3.7 08/08/2024   CL 106 08/08/2024   CO2 25 08/08/2024   GLUCOSE 117 (H) 08/08/2024   BUN 12 08/08/2024   CREATININE 0.69 08/08/2024   CALCIUM  9.1 08/08/2024   PROT 6.8 08/08/2024   ALBUMIN 4.1 08/08/2024   AST 15 08/08/2024   ALT 11 08/08/2024   ALKPHOS 116 08/08/2024   BILITOT 0.3 08/08/2024   GFRNONAA >60 08/08/2024   GFRAA 82 01/05/2020    Lab Results  Component Value Date   WBC 12.1 (H) 08/08/2024   NEUTROABS 8.7 (H) 08/08/2024   HGB 7.4 (L) 08/08/2024   HCT 22.8 (L) 08/08/2024   MCV 93.8 08/08/2024   PLT 269 08/08/2024     STUDIES: No results found.   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  After lengthy discussion with the patient, she agreed to proceed with neoadjuvant chemotherapy with Adriamycin  and Cytoxan  x 4 with GCF support followed by weekly Taxol x 12.  This will then be followed by lumpectomy and adjuvant XRT.  After completion of all her treatments, she will benefit from 5 years of letrozole given the ER positivity.  MUGA scan from June 24, 2024 revealed an EF of 57%.  Patient has had port placement.  Proceed with cycle 4 of Adriamycin  and Cytoxan  today.  Return to clinic in 2 days for G-CSF support.  Return for blood transfusion tomorrow.   Chest pain/palpitations: Resolved.  Patient was previously given a referral to cardiology.   Hyponatremia: Resolved. Hypokalemia: Resolved. Leukocytosis: Likely reactive from G-CSF support. stable Anemia: Hemoglobin is trended down to 7.4 symptomatic today with intermittent shortness of breath with increased fatigue.  Added Folate, vitamin b12, ferritin and iron/TIBC on to labs.  Arranged for blood transfusion tomorrow.  Return to clinic Monday for repeat blood work with possible transfusion/iron day 2. Shortness of breath/fatigue: Likely secondary to chemo induced anemia.  Arranged for blood transfusion tomorrow.   The entire visit was done in the presence of an ASL interpreter.  Patient expressed understanding and was in agreement with this plan. She also understands that She can call clinic at any time with any questions, concerns, or complaints.    Cancer Staging  Invasive ductal carcinoma of right breast [facility]) Staging form: Breast, AJCC 8th Edition - Clinical stage from 06/16/2024: Stage IIB (cT2, cN0, cM0, G3, ER+, PR-, HER2-) - Signed by Jacobo Evalene PARAS, MD on 06/16/2024 Stage prefix: Initial diagnosis Histologic grading system: 3 grade system  Follow up plan:  Proceed with treatment today Blood transfusion 1 unit tomorrow F/U in clinic Monday D1 see MD cbc with diff D2  possible blood transfusion/iron LP    Morna Husband, NP   08/08/2024 12:50 PM      "

## 2024-08-09 ENCOUNTER — Inpatient Hospital Stay

## 2024-08-09 DIAGNOSIS — Z5111 Encounter for antineoplastic chemotherapy: Secondary | ICD-10-CM | POA: Diagnosis not present

## 2024-08-09 DIAGNOSIS — D649 Anemia, unspecified: Secondary | ICD-10-CM

## 2024-08-09 MED ORDER — SODIUM CHLORIDE 0.9% IV SOLUTION
250.0000 mL | INTRAVENOUS | Status: DC
  Administered 2024-08-09: 250 mL via INTRAVENOUS
  Filled 2024-08-09: qty 250

## 2024-08-09 MED ORDER — DIPHENHYDRAMINE HCL 50 MG/ML IJ SOLN
25.0000 mg | Freq: Once | INTRAMUSCULAR | Status: AC
  Administered 2024-08-09: 25 mg via INTRAVENOUS
  Filled 2024-08-09: qty 1

## 2024-08-09 MED ORDER — SODIUM CHLORIDE 0.9% FLUSH
10.0000 mL | INTRAVENOUS | Status: AC | PRN
  Administered 2024-08-09: 10 mL
  Filled 2024-08-09: qty 10

## 2024-08-09 MED ORDER — ACETAMINOPHEN 325 MG PO TABS
650.0000 mg | ORAL_TABLET | Freq: Once | ORAL | Status: AC
  Administered 2024-08-09: 650 mg via ORAL
  Filled 2024-08-09: qty 2

## 2024-08-10 ENCOUNTER — Inpatient Hospital Stay

## 2024-08-10 DIAGNOSIS — Z5111 Encounter for antineoplastic chemotherapy: Secondary | ICD-10-CM | POA: Diagnosis not present

## 2024-08-10 DIAGNOSIS — C50911 Malignant neoplasm of unspecified site of right female breast: Secondary | ICD-10-CM

## 2024-08-10 LAB — TYPE AND SCREEN
ABO/RH(D): O POS
Antibody Screen: NEGATIVE
Unit division: 0

## 2024-08-10 LAB — BPAM RBC
Blood Product Expiration Date: 202512292359
ISSUE DATE / TIME: 202512230916
Unit Type and Rh: 5100

## 2024-08-10 MED ORDER — PEGFILGRASTIM-CBQV 6 MG/0.6ML ~~LOC~~ SOSY
6.0000 mg | PREFILLED_SYRINGE | Freq: Once | SUBCUTANEOUS | Status: AC
  Administered 2024-08-10: 6 mg via SUBCUTANEOUS
  Filled 2024-08-10: qty 0.6

## 2024-08-15 ENCOUNTER — Inpatient Hospital Stay: Admitting: Oncology

## 2024-08-15 ENCOUNTER — Inpatient Hospital Stay

## 2024-08-16 ENCOUNTER — Encounter: Payer: Self-pay | Admitting: Family Medicine

## 2024-08-16 ENCOUNTER — Ambulatory Visit: Admitting: Family Medicine

## 2024-08-16 ENCOUNTER — Encounter: Payer: Self-pay | Admitting: Oncology

## 2024-08-16 ENCOUNTER — Other Ambulatory Visit (HOSPITAL_COMMUNITY): Payer: Self-pay

## 2024-08-16 ENCOUNTER — Telehealth: Payer: Self-pay | Admitting: Pharmacy Technician

## 2024-08-16 ENCOUNTER — Other Ambulatory Visit: Payer: Self-pay | Admitting: *Deleted

## 2024-08-16 ENCOUNTER — Inpatient Hospital Stay

## 2024-08-16 VITALS — BP 142/70 | HR 77 | Temp 98.5°F | Ht 64.0 in | Wt 188.8 lb

## 2024-08-16 DIAGNOSIS — C50911 Malignant neoplasm of unspecified site of right female breast: Secondary | ICD-10-CM

## 2024-08-16 DIAGNOSIS — H04123 Dry eye syndrome of bilateral lacrimal glands: Secondary | ICD-10-CM

## 2024-08-16 DIAGNOSIS — I10 Essential (primary) hypertension: Secondary | ICD-10-CM

## 2024-08-16 DIAGNOSIS — K219 Gastro-esophageal reflux disease without esophagitis: Secondary | ICD-10-CM | POA: Diagnosis not present

## 2024-08-16 DIAGNOSIS — E559 Vitamin D deficiency, unspecified: Secondary | ICD-10-CM

## 2024-08-16 DIAGNOSIS — D649 Anemia, unspecified: Secondary | ICD-10-CM

## 2024-08-16 DIAGNOSIS — R1314 Dysphagia, pharyngoesophageal phase: Secondary | ICD-10-CM | POA: Diagnosis not present

## 2024-08-16 DIAGNOSIS — J4540 Moderate persistent asthma, uncomplicated: Secondary | ICD-10-CM | POA: Diagnosis not present

## 2024-08-16 MED ORDER — BUDESONIDE-FORMOTEROL FUMARATE 80-4.5 MCG/ACT IN AERO: 2.0000 | INHALATION_SPRAY | Freq: Two times a day (BID) | RESPIRATORY_TRACT | 6 refills | Status: AC

## 2024-08-16 MED ORDER — OMEPRAZOLE 40 MG PO CPDR: 40.0000 mg | DELAYED_RELEASE_CAPSULE | Freq: Every day | ORAL | 3 refills | Status: AC

## 2024-08-16 MED ORDER — CARVEDILOL 3.125 MG PO TABS: 3.1250 mg | ORAL_TABLET | Freq: Two times a day (BID) | ORAL | 3 refills | Status: AC

## 2024-08-16 NOTE — Progress Notes (Signed)
 "     Established patient visit   Patient: Caitlin [name]   DOB: 10-16-1965   58 y.o. Female  MRN: [id number] Visit Date: 08/16/2024  Today's healthcare provider: LAURAINE LOISE BUOY, DO   Chief Complaint  Patient presents with   Follow-up    Patient is here today for a month follow up regarding her thyroid .  When she drinks liquids, it feels like something is sticking in her throat not sure if it from the chemo but has been bothering her for about a month.  Sometimes she ends up vomit and then sometimes she eats and will also vomit that as well, states it could be acid reflux.   Subjective    HPI Sign language interpreter: Caitlin [name], license [id number]   Caitlin [name] is a 58 year old female who presents with difficulty swallowing and nausea.  She experiences difficulty swallowing, described as a 'sticky' sensation in her throat after eating, which leads to vomiting. This occurs every time she eats and has been ongoing for about two months. She reports that she had trouble swallowing even before starting chemotherapy. Swallowing ice provides some relief. She has not yet consulted a gastroenterologist for this issue.  She reports a sensation of a slow heart rate, and was told by her doctor that it may be due to her chemotherapy. This has been present for about a month. Her cardiologist increased her carvedilol  dosage to 6.25 mg twice daily at the end of November.  She reports that her heart rate was recorded as low during chemotherapy sessions, but she does not recall specific numbers. She experiences weakness and fatigue.  She is undergoing chemotherapy and has been receiving treatment for two months. She recently started a new chemotherapy regimen two weeks ago and received a blood transfusion due to low iron levels, with a hemoglobin level of 7.4. She experiences nausea, for which she takes Zofran , which she finds helpful. She has not tried her other prescribed anti-nausea  medication due to apprehension.  She reports dry eyes for the past month, initially while using scented lotion. She was prescribed an antibiotic for her eyes after consulting an eye doctor. She uses a hot compress twice daily to manage symptoms.  She has been out of her Symbicort  inhaler for two months and has a follow-up with her pulmonologist scheduled for January. She continues to use albuterol  as needed but does not find it is helpful as the Symbicort . She takes omeprazole  daily, which she finds somewhat helpful for her symptoms. She also reports feeling out of breath at times, with her inhaler providing limited relief.      Medications: Show/hide medication list[1]       Objective    BP (!) 142/70 (BP Location: Left Arm, Patient Position: Sitting, Cuff Size: Normal)   Pulse 77   Temp 98.5 F (36.9 C) (Oral)   Ht 5' 4 (1.626 m)   Wt 188 lb 12.8 oz (85.6 kg)   LMP 05/08/2016   SpO2 100%   BMI 32.41 kg/m     Physical Exam Vitals and nursing note reviewed.  Constitutional:      General: She is not in acute distress.    Appearance: Normal appearance.  HENT:     Head: Normocephalic and atraumatic.  Eyes:     General: No scleral icterus.    Conjunctiva/sclera: Conjunctivae normal.  Cardiovascular:     Rate and Rhythm: Normal rate.  Pulmonary:     Effort: Pulmonary effort is  normal.  Neurological:     Mental Status: She is alert and oriented to person, place, and time. Mental status is at baseline.  Psychiatric:        Mood and Affect: Mood normal.        Behavior: Behavior normal.      No results found for any visits on 08/16/24.  Assessment & Plan    Primary hypertension Assessment & Plan: Chronic, controlled with valsartan  320 mg daily, amlodipine  10 mg daily and carvedilol  6.25 mg twice daily. Carvedilol  recently increased (07/11/2024) and patient has been experiencing feelings of weakness and malaise since the increase, noting that her heart feels like  it's running too slowly. Will transition her back down to 3.125 mg twice daily. - Patient to follow-up with cardiology in February; advised patient to schedule that appointment as it does not appear to be scheduled currently.  Orders: -     Carvedilol ; Take 1 tablet (3.125 mg total) by mouth 2 (two) times daily with a meal.  Dispense: 60 tablet; Refill: 3  Gastroesophageal reflux disease, unspecified whether esophagitis present -     Omeprazole ; Take 1 capsule (40 mg total) by mouth daily.  Dispense: 30 capsule; Refill: 3  Pharyngoesophageal dysphagia  Moderate persistent asthma without complication -     Budesonide -Formoterol  Fumarate; Inhale 2 puffs into the lungs in the morning and at bedtime.  Dispense: 1 each; Refill: 6  Vitamin D  deficiency -     VITAMIN D  25 Hydroxy (Vit-D Deficiency, Fractures); Future  Dry eye syndrome of both eyes     Gastroesophageal reflux disease, unspecified whether esophagitis present; pharyngoesophageal dysphagia Patient experienced improvement with start of omeprazole  after emergency department visit.  She is currently experiencing persistent dysphagia and chemotherapy-induced nausea, which she has intermittently been managing with Zofran . - Increase omeprazole  to 40 mg daily. - Continue Zofran  before meals.  May alternate with prochlorperazine ; cautioned patient to use one or the other but not both simultaneously.  Moderate persistent asthma without complication Occasional dyspnea not relieved by inhaler. Symbicort  and albuterol  prescribed. - Refilled Symbicort  inhaler. - Ensure daily use of Symbicort . - Use albuterol  as needed.  Vitamin D  deficiency Completed her vitamin D  supplements. - Ordered vitamin D  level check during next blood draw.  Dry eye syndrome of both eyes Experiencing dry eyes, previously treated with antibiotic drops. - Recommended Systane, TheraTears or Pataday  eye drops. - Continue using hot compresses.  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  No acute concerns. - Follows with hem/onc; defer to specialist management.    Return in about 4 months (around 12/15/2024) for [facility] with next provider.      I discussed the assessment and treatment plan with the patient  The patient was provided an opportunity to ask questions and all were answered. The patient agreed with the plan and demonstrated an understanding of the instructions.   The patient was advised to call back or seek an in-person evaluation if the symptoms worsen or if the condition fails to improve as anticipated.    Caitlin LOISE BUOY, DO  [facility] [phone] (phone) [phone] (fax)  [facility]     [1]  Outpatient Medications Prior to Visit  Medication Sig   albuterol  (VENTOLIN  HFA) 108 (90 Base) MCG/ACT inhaler Inhale 2 puffs into the lungs every 6 (six) hours as needed for wheezing or shortness of breath.   atorvastatin  (LIPITOR) 20 MG  tablet Take 1 tablet (20 mg total) by mouth daily.   Blood Pressure Monitor KIT Use to check blood pressure daily   ibuprofen  (ADVIL ) 800 MG tablet Take 1 tablet (800 mg total) by mouth every 8 (eight) hours as needed for cramping.   levocetirizine (XYZAL ) 5 MG tablet Take 1 tablet (5 mg total) by mouth every evening.   lidocaine -prilocaine  (EMLA ) cream Apply to affected area once   Misc. Devices MISC CPAP therapy on 17 cm H2O . Patient to use X-Small size Resmed Full Face Mask AirFit F10. Diagnosis - Obstructive sleep apnea   neomycin-polymyxin b-dexamethasone  (MAXITROL) 3.5-10000-0.1 SUSP SMARTSIG:In Eye(s)   nitroGLYCERIN  (NITROSTAT ) 0.4 MG SL tablet Place 1 tablet (0.4 mg total) under the tongue every 5 (five) minutes as needed for chest pain.   ondansetron  (ZOFRAN ) 8 MG tablet Take 1 tablet (8 mg total) by mouth every 8 (eight) hours as needed for nausea or vomiting.    oxyCODONE -acetaminophen  (PERCOCET/ROXICET) 5-325 MG tablet Take 1 tablet by mouth every 8 (eight) hours as needed for severe pain (pain score 7-10).   prochlorperazine  (COMPAZINE ) 10 MG tablet Take 1 tablet (10 mg total) by mouth every 6 (six) hours as needed for nausea or vomiting.   valsartan  (DIOVAN ) 320 MG tablet Take 1 tablet (320 mg total) by mouth daily.   Vitamin D , Ergocalciferol , (DRISDOL ) 1.25 MG (50000 UNIT) CAPS capsule Take 1 capsule (50,000 Units total) by mouth every 7 (seven) days.   [DISCONTINUED] amLODipine  (NORVASC ) 10 MG tablet TAKE 1 TABLET BY MOUTH ONCE DAILY. TO LOWER BLOOD PRESSURE (Patient not taking: Reported on 08/23/2024)   [DISCONTINUED] budesonide -formoterol  (SYMBICORT ) 80-4.5 MCG/ACT inhaler Inhale 2 puffs into the lungs in the morning and at bedtime.   [DISCONTINUED] carvedilol  (COREG ) 6.25 MG tablet Take 1 tablet (6.25 mg total) by mouth 2 (two) times daily with a meal.   [DISCONTINUED] omeprazole  (PRILOSEC  OTC) 20 MG tablet Take 1 tablet (20 mg total) by mouth daily.   [DISCONTINUED] omeprazole  (PRILOSEC ) 20 MG capsule Take 20 mg by mouth daily.   No facility-administered medications prior to visit.   "

## 2024-08-16 NOTE — Telephone Encounter (Signed)
 Pharmacy Patient Advocate Encounter   Received notification from Onbase that prior authorization for Breyna  80-4.5MCG/ACT aerosol   is required/requested.   Insurance verification completed.   The patient is insured through [employer].   Per test claim:  Brand name Symbicort  is preferred by the insurance.  If suggested medication is appropriate, Please send in a new RX and discontinue this one. If not, please advise as to why it's not appropriate so that we may request a Prior Authorization. Please note, some preferred medications may still require a PA.  If the suggested medications have not been trialed and there are no contraindications to their use, the PA will not be submitted, as it will not be approved.  New prescription is not required. Called patient's pharmacy and had them reprocess for Symbicort  and confirmed that is did process for a $4.00 copay.

## 2024-08-16 NOTE — Assessment & Plan Note (Addendum)
 Chronic, controlled with valsartan  320 mg daily, amlodipine  10 mg daily and carvedilol  6.25 mg twice daily. Carvedilol  recently increased (07/11/2024) and patient has been experiencing feelings of weakness and malaise since the increase, noting that her heart feels like it's running too slowly. Will transition her back down to 3.125 mg twice daily. - Patient to follow-up with cardiology in February; advised patient to schedule that appointment as it does not appear to be scheduled currently.

## 2024-08-17 ENCOUNTER — Inpatient Hospital Stay (HOSPITAL_BASED_OUTPATIENT_CLINIC_OR_DEPARTMENT_OTHER): Admitting: Oncology

## 2024-08-17 ENCOUNTER — Other Ambulatory Visit: Payer: Self-pay

## 2024-08-17 ENCOUNTER — Inpatient Hospital Stay

## 2024-08-17 ENCOUNTER — Encounter: Payer: Self-pay | Admitting: Oncology

## 2024-08-17 ENCOUNTER — Telehealth: Payer: Self-pay | Admitting: Cardiology

## 2024-08-17 VITALS — BP 148/82 | HR 87 | Temp 98.6°F | Resp 18 | Ht 64.0 in | Wt 187.0 lb

## 2024-08-17 DIAGNOSIS — Z5111 Encounter for antineoplastic chemotherapy: Secondary | ICD-10-CM | POA: Diagnosis not present

## 2024-08-17 DIAGNOSIS — C50911 Malignant neoplasm of unspecified site of right female breast: Secondary | ICD-10-CM | POA: Diagnosis not present

## 2024-08-17 DIAGNOSIS — D649 Anemia, unspecified: Secondary | ICD-10-CM

## 2024-08-17 LAB — CBC WITH DIFFERENTIAL/PLATELET
Abs Immature Granulocytes: 0.16 K/uL — ABNORMAL HIGH (ref 0.00–0.07)
Basophils Absolute: 0 K/uL (ref 0.0–0.1)
Basophils Relative: 1 %
Eosinophils Absolute: 0 K/uL (ref 0.0–0.5)
Eosinophils Relative: 0 %
HCT: 29.8 % — ABNORMAL LOW (ref 36.0–46.0)
Hemoglobin: 9.9 g/dL — ABNORMAL LOW (ref 12.0–15.0)
Immature Granulocytes: 3 %
Lymphocytes Relative: 5 %
Lymphs Abs: 0.3 K/uL — ABNORMAL LOW (ref 0.7–4.0)
MCH: 30.4 pg (ref 26.0–34.0)
MCHC: 33.2 g/dL (ref 30.0–36.0)
MCV: 91.4 fL (ref 80.0–100.0)
Monocytes Absolute: 0.5 K/uL (ref 0.1–1.0)
Monocytes Relative: 11 %
Neutro Abs: 4.1 K/uL (ref 1.7–7.7)
Neutrophils Relative %: 80 %
Platelets: 177 K/uL (ref 150–400)
RBC: 3.26 MIL/uL — ABNORMAL LOW (ref 3.87–5.11)
RDW: 14.5 % (ref 11.5–15.5)
Smear Review: NORMAL
WBC: 5.1 K/uL (ref 4.0–10.5)
nRBC: 0.4 % — ABNORMAL HIGH (ref 0.0–0.2)

## 2024-08-17 LAB — CMP (CANCER CENTER ONLY)
ALT: 9 U/L (ref 0–44)
AST: 15 U/L (ref 15–41)
Albumin: 4.3 g/dL (ref 3.5–5.0)
Alkaline Phosphatase: 136 U/L — ABNORMAL HIGH (ref 38–126)
Anion gap: 10 (ref 5–15)
BUN: 7 mg/dL (ref 6–20)
CO2: 27 mmol/L (ref 22–32)
Calcium: 9.4 mg/dL (ref 8.9–10.3)
Chloride: 104 mmol/L (ref 98–111)
Creatinine: 0.65 mg/dL (ref 0.44–1.00)
GFR, Estimated: >60 mL/min
Glucose, Bld: 141 mg/dL — ABNORMAL HIGH (ref 70–99)
Potassium: 3.7 mmol/L (ref 3.5–5.1)
Sodium: 141 mmol/L (ref 135–145)
Total Bilirubin: 0.3 mg/dL (ref 0.0–1.2)
Total Protein: 7.3 g/dL (ref 6.5–8.1)

## 2024-08-17 LAB — SAMPLE TO BLOOD BANK

## 2024-08-17 NOTE — Telephone Encounter (Signed)
 [facility] calling to say pt was seen in their office today by Dr. Jacobo and he wanted Dr. Darliss to know pt has been having high BP readings. Please advise.

## 2024-08-17 NOTE — Telephone Encounter (Signed)
 Called patient to advise :  Recommend patient monitor blood pressure at home and follow-up with Dr. Darliss or an APP next week to reassess her blood pressure and symptoms.  If she develops any new/worsening symptoms like chest pain, shortness of breath, lightheadedness, or headaches, she should go to the ER for further evaluation.   Lonni Hanson, MD [facility]  Recommended patient get a bp cuff for home to record BP, OV scheduled with Lesley Maffucci, PA on 08/23/24

## 2024-08-17 NOTE — Telephone Encounter (Signed)
 Recommend patient monitor blood pressure at home and follow-up with Dr. Darliss or an APP next week to reassess her blood pressure and symptoms.  If she develops any new/worsening symptoms like chest pain, shortness of breath, lightheadedness, or headaches, she should go to the ER for further evaluation.  Caitlin Hanson, MD [facility]

## 2024-08-17 NOTE — Progress Notes (Signed)
 Patient has still been having the shortness of breath along with the heart palpitations. Her BP has been higher recently also, so she would like to try to change doses on her BP medications, I told her that I would call at lunch at her Cardiologist office for appointment.

## 2024-08-17 NOTE — Progress Notes (Signed)
 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  INTERVAL HISTORY: Patient returns to clinic today for repeat laboratory and further evaluation.  She received cycle 4 of Adriamycin  and Cytoxan  last week and tolerated treatment well.  She is concerned about her elevated blood pressure, but otherwise feels well.  She has no neurologic complaints.  She denies any recent fevers or illnesses.  She has a good appetite and denies weight loss.  She has no chest pain, shortness of breath, cough, or hemoptysis.  She denies any nausea, vomiting, constipation, or diarrhea.  She has no urinary complaints.  Patient offers no further specific complaints today.  REVIEW OF SYSTEMS:   Review of Systems  Constitutional: Negative.  Negative for fever, malaise/fatigue and weight loss.  Respiratory: Negative.  Negative for cough, hemoptysis and shortness of breath.   Cardiovascular: Negative.  Negative for chest pain and leg swelling.  Gastrointestinal: Negative.  Negative for abdominal pain.  Genitourinary: Negative.  Negative for dysuria.  Musculoskeletal: Negative.  Negative for back pain.  Skin: Negative.  Negative for rash.  Neurological: Negative.  Negative for dizziness, focal weakness and weakness.  Psychiatric/Behavioral: Negative.  The patient is not nervous/anxious.     As per HPI. Otherwise, a complete review of systems is negative.  PAST MEDICAL HISTORY: Past Medical History:  Diagnosis Date   Asthma    Cancer (HCC)    COPD (chronic obstructive pulmonary disease) (HCC)    Deaf    Endometrial polyp    GERD  (gastroesophageal reflux disease)    Hypertension 2011   NO (nasal obstruction)    OSA (obstructive sleep apnea)     PAST SURGICAL HISTORY: Past Surgical History:  Procedure Laterality Date   BREAST BIOPSY Right 06/08/2024   US  RT BREAST BX W LOC DEV 1ST LESION IMG BX SPEC US  GUIDE 06/08/2024 [facility]   CESAREAN SECTION     x 2   CHOLECYSTECTOMY     DENTAL SURGERY Bilateral 04/2024   Removed both fang teeth on the top and wisdom as well.   HYSTEROSCOPY WITH D & C N/A 07/18/2024   Procedure: DILATATION AND CURETTAGE /HYSTEROSCOPY;  Surgeon: Leigh Sober, MD;  Location: [facility];  Service: Gynecology;  Laterality: N/A;   PORTACATH PLACEMENT Left 06/22/2024   Procedure: INSERTION, TUNNELED CENTRAL VENOUS DEVICE, WITH PORT;  Surgeon: Marinda Jayson KIDD, MD;  Location: [facility];  Service: General;  Laterality: Left;   TUBAL LIGATION      FAMILY HISTORY: Family History  Problem Relation Age of Onset   Heart disease Mother    Stroke Mother    Brain cancer Half-Brother        dx 30s   Breast cancer Half-Sister        dx 30s    ADVANCED DIRECTIVES (Y/N):  N  HEALTH MAINTENANCE: Social History   Tobacco Use   Smoking status: Never   Smokeless tobacco: Never  Vaping Use   Vaping status: Never Used  Substance Use Topics   Alcohol use: Not Currently    Alcohol/week: 1.0 standard drink of alcohol    Types: 1 Glasses of wine per week  Comment: occassionally    Drug use: Not Currently    Types: Crack cocaine     Colonoscopy:  PAP:  Bone density:  Lipid panel:  Allergies  Allergen Reactions   Egg Protein-Containing Drug Products Nausea And Vomiting   Milk-Related Compounds     Current Outpatient Medications  Medication Sig Dispense Refill   albuterol  (VENTOLIN  HFA) 108 (90 Base) MCG/ACT inhaler Inhale 2 puffs into the lungs every 6 (six) hours as needed for wheezing or shortness of breath. 18 g 6   amLODipine  (NORVASC ) 10 MG tablet TAKE 1 TABLET BY MOUTH ONCE  DAILY. TO LOWER BLOOD PRESSURE 90 tablet 3   atorvastatin  (LIPITOR) 20 MG tablet Take 1 tablet (20 mg total) by mouth daily. 90 tablet 1   Blood Pressure Monitor KIT Use to check blood pressure daily 1 kit 0   budesonide -formoterol  (SYMBICORT ) 80-4.5 MCG/ACT inhaler Inhale 2 puffs into the lungs in the morning and at bedtime. 1 each 6   carvedilol  (COREG ) 3.125 MG tablet Take 1 tablet (3.125 mg total) by mouth 2 (two) times daily with a meal. 60 tablet 3   ibuprofen  (ADVIL ) 800 MG tablet Take 1 tablet (800 mg total) by mouth every 8 (eight) hours as needed for cramping. 15 tablet 0   levocetirizine (XYZAL ) 5 MG tablet Take 1 tablet (5 mg total) by mouth every evening. 90 tablet 3   lidocaine -prilocaine  (EMLA ) cream Apply to affected area once 30 g 3   Misc. Devices MISC CPAP therapy on 17 cm H2O . Patient to use X-Small size Resmed Full Face Mask AirFit F10. Diagnosis - Obstructive sleep apnea 1 each 0   neomycin-polymyxin b-dexamethasone  (MAXITROL) 3.5-10000-0.1 SUSP SMARTSIG:In Eye(s)     nitroGLYCERIN  (NITROSTAT ) 0.4 MG SL tablet Place 1 tablet (0.4 mg total) under the tongue every 5 (five) minutes as needed for chest pain. 20 tablet 1   omeprazole  (PRILOSEC ) 40 MG capsule Take 1 capsule (40 mg total) by mouth daily. 30 capsule 3   ondansetron  (ZOFRAN ) 8 MG tablet Take 1 tablet (8 mg total) by mouth every 8 (eight) hours as needed for nausea or vomiting. 30 tablet 3   oxyCODONE -acetaminophen  (PERCOCET/ROXICET) 5-325 MG tablet Take 1 tablet by mouth every 8 (eight) hours as needed for severe pain (pain score 7-10). 3 tablet 0   prochlorperazine  (COMPAZINE ) 10 MG tablet Take 1 tablet (10 mg total) by mouth every 6 (six) hours as needed for nausea or vomiting. 30 tablet 3   valsartan  (DIOVAN ) 320 MG tablet Take 1 tablet (320 mg total) by mouth daily. 90 tablet 3   Vitamin D , Ergocalciferol , (DRISDOL ) 1.25 MG (50000 UNIT) CAPS capsule Take 1 capsule (50,000 Units total) by mouth every 7 (seven) days.  12 capsule 1   No current facility-administered medications for this visit.    OBJECTIVE: Vitals:   08/17/24 0941 08/17/24 0942  BP: (!) 151/83 (!) 148/82  Pulse: 87   Resp: 18   Temp: 98.6 F (37 C)   SpO2: 100%      Body mass index is 32.1 kg/m.    ECOG FS:0 - Asymptomatic  General: Well-developed, well-nourished, no acute distress. Eyes: Pink conjunctiva, anicteric sclera. HEENT: Normocephalic, moist mucous membranes. Lungs: No audible wheezing or coughing. Heart: Regular rate and rhythm. Abdomen: Soft, nontender, no obvious distention. Musculoskeletal: No edema, cyanosis, or clubbing. Neuro: Alert, answering all questions appropriately. Cranial nerves grossly intact. Skin: No rashes or petechiae noted. Psych: Normal affect.  LAB RESULTS:  Lab Results  Component  Value Date   NA 141 08/17/2024   K 3.7 08/17/2024   CL 104 08/17/2024   CO2 27 08/17/2024   GLUCOSE 141 (H) 08/17/2024   BUN 7 08/17/2024   CREATININE 0.65 08/17/2024   CALCIUM  9.4 08/17/2024   PROT 7.3 08/17/2024   ALBUMIN 4.3 08/17/2024   AST 15 08/17/2024   ALT 9 08/17/2024   ALKPHOS 136 (H) 08/17/2024   BILITOT 0.3 08/17/2024   GFRNONAA >60 08/17/2024   GFRAA 82 01/05/2020    Lab Results  Component Value Date   WBC 5.1 08/17/2024   NEUTROABS 4.1 08/17/2024   HGB 9.9 (L) 08/17/2024   HCT 29.8 (L) 08/17/2024   MCV 91.4 08/17/2024   PLT 177 08/17/2024     STUDIES: No results found.   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  After lengthy discussion with the patient, she agreed to proceed with neoadjuvant chemotherapy with Adriamycin  and Cytoxan  x 4 with GCF support followed by weekly Taxol x 12.  This will then be followed by lumpectomy and adjuvant XRT.   After completion of all her treatments, she will benefit from 5 years of letrozole given the ER positivity.  MUGA scan from June 24, 2024 revealed an EF of 57%.  Patient has had port placement.  Patient received cycle 4 of Adriamycin  and Cytoxan  on August 08, 2024.  Return to clinic as previously scheduled in 1 week for further evaluation and consideration of cycle 1 of 12 of weekly Taxol.  Chest pain/palpitations/hypertension: Patient has been instructed to follow-up with cardiology for further management.   Leukocytosis: Resolved. Anemia: Hemoglobin improved to 9.9 with transfusion last week.  Iron stores, folate, and B12 are all within normal limits.  The entire visit was done in the presence of an ASL interpreter.  Patient expressed understanding and was in agreement with this plan. She also understands that She can call clinic at any time with any questions, concerns, or complaints.    Cancer Staging  Invasive ductal carcinoma of right breast [facility]) Staging form: Breast, AJCC 8th Edition - Clinical stage from 06/16/2024: Stage IIB (cT2, cN0, cM0, G3, ER+, PR-, HER2-) - Signed by Jacobo Evalene PARAS, MD on 06/16/2024 Stage prefix: Initial diagnosis Histologic grading system: 3 grade system   Evalene PARAS Jacobo, MD   08/17/2024 10:27 AM      "

## 2024-08-19 ENCOUNTER — Inpatient Hospital Stay

## 2024-08-22 DIAGNOSIS — D649 Anemia, unspecified: Secondary | ICD-10-CM

## 2024-08-22 NOTE — Progress Notes (Deleted)
 "  Cardiology Office Note    Date:  08/22/2024   ID:  Caitlin [name], DOB 1965-09-02, MRN [id number]  PCP:  Caitlin Lauraine SAILOR, DO  Cardiologist:  None  Electrophysiologist:  None   Chief Complaint: ***  History of Present Illness:   Caitlin [name] is a 59 y.o. female with history of hypertension, hyperlipidemia, right breast cancer on chemo, deafness who presents for***.    Patient was initially evaluated by Dr. Darliss 09/2023 in the setting of palpitations.  ZIO monitor revealed predominantly sinus rhythm with 1 episode of PSVT without evidence of sustained arrhythmia.  Occasional PACs and rare PVCs were noted.  Echo showed EF 60 to 65% with no RWMA, G1 DD, mild LVH, and no significant valvular abnormalities.  Patient had a ED visit 06/2024 with symptoms of chest pain.  Troponin was minimally elevated at 31 > 28.  Echo with preserved LV systolic function.   Patient was most recently seen in the cardiology office 07/07/2024 for preoperative evaluation prior to [facility].  She was deemed to be low risk from a cardiac perspective.  Noted to be hypokalemic and hydrochlorothiazide  was subsequently discontinued with the addition of valsartan .  ***  Labs independently reviewed: 08/17/2024-Hgb 9.9, HCT 29.8, platelets 177, sodium 141, potassium 3.7, BUN 7, creatinine 0.65, normal LFTs 02/2024-TC 152, TG 170, HDL 35, LDL 96  Objective   Past Medical History:  Diagnosis Date   Asthma    Cancer (HCC)    COPD (chronic obstructive pulmonary disease) (HCC)    Deaf    Endometrial polyp    GERD (gastroesophageal reflux disease)    Hypertension 2011   NO (nasal obstruction)    OSA (obstructive sleep apnea)     Current Medications: Active Medications[1]  Allergies:   Egg protein-containing drug products and Milk-related compounds   Social History   Socioeconomic History   Marital status: Legally Separated    Spouse name: Not on file   Number of children: Not on file   Years of  education: Not on file   Highest education level: 12th grade  Occupational History    Employer: UNEMPLOYED  Tobacco Use   Smoking status: Never   Smokeless tobacco: Never  Vaping Use   Vaping status: Never Used  Substance and Sexual Activity   Alcohol use: Not Currently    Alcohol/week: 1.0 standard drink of alcohol    Types: 1 Glasses of wine per week    Comment: occassionally    Drug use: Not Currently    Types: Crack cocaine   Sexual activity: Yes    Birth control/protection: Surgical  Other Topics Concern   Not on file  Social History Narrative   Lives with daughter   Social Drivers of Health   Tobacco Use: Low Risk (08/17/2024)   Patient History    Smoking Tobacco Use: Never    Smokeless Tobacco Use: Never    Passive Exposure: Not on file  Financial Resource Strain: Medium Risk (02/17/2024)   Overall Financial Resource Strain (CARDIA)    Difficulty of Paying Living Expenses: Somewhat hard  Food Insecurity: Patient Declined (06/26/2024)   Epic    Worried About Programme Researcher, Broadcasting/film/video in the Last Year: Patient declined    Barista in the Last Year: Patient declined  Transportation Needs: No Transportation Needs (06/26/2024)   Epic    Lack of Transportation (Medical): No    Lack of Transportation (Non-Medical): No  Physical Activity: Insufficiently Active (02/17/2024)  Exercise Vital Sign    Days of Exercise per Week: 1 day    Minutes of Exercise per Session: 20 min  Stress: No Stress Concern Present (02/17/2024)   [employer] of Occupational Health - Occupational Stress Questionnaire    Feeling of Stress: Only a little  Social Connections: Socially Isolated (02/17/2024)   Social Connection and Isolation Panel    Frequency of Communication with Friends and Family: Once a week    Frequency of Social Gatherings with Friends and Family: Once a week    Attends Religious Services: Never    Database Administrator or Organizations: No    Attends Hospital Doctor: Not on file    Marital Status: Separated  Depression (PHQ2-9): Low Risk (08/16/2024)   Depression (PHQ2-9)    PHQ-2 Score: 2  Alcohol Screen: Low Risk (02/17/2024)   Alcohol Screen    Last Alcohol Screening Score (AUDIT): 2  Housing: Unknown (06/26/2024)   Epic    Unable to Pay for Housing in the Last Year: No    Number of Times Moved in the Last Year: Not on file    Homeless in the Last Year: No  Utilities: Not At Risk (06/26/2024)   Epic    Threatened with loss of utilities: No  Health Literacy: Not on file     Family History:  The patient's family history includes Brain cancer in her half-brother; Breast cancer in her half-sister; Heart disease in her mother; Stroke in her mother.  ROS:   12-point review of systems is negative unless otherwise noted in the HPI.  EKGs/Other Studies Reviewed:    Studies reviewed were summarized above. The additional studies were reviewed today:  06/2024 2D echo 1. Left ventricular ejection fraction, by estimation, is 60 to 65%. The  left ventricle has normal function. The left ventricle has no regional  wall motion abnormalities. There is mild left ventricular hypertrophy.  Left ventricular diastolic function  could not be evaluated.   2. Right ventricular systolic function is normal. The right ventricular  size is normal. Tricuspid regurgitation signal is inadequate for assessing  PA pressure.   3. Left atrial size was mildly dilated.   4. The mitral valve is normal in structure. No evidence of mitral valve  regurgitation. No evidence of mitral stenosis.   5. The aortic valve is normal in structure. Aortic valve regurgitation is  not visualized. No aortic stenosis is present.   10/2023 Long term monitor Average heart rate 76 bpm, range 51-153 bpm. 1 run of nonsustained SVT lasting 7 beats. No atrial fibrillation or atrial flutter. No sustained arrhythmias.  EKG:  EKG personally reviewed by me today    PHYSICAL EXAM:    VS:   LMP 05/08/2016   BMI: There is no height or weight on file to calculate BMI.  GEN: Well nourished, well developed in no acute distress NECK: No JVD; No carotid bruits CARDIAC: ***RRR, no murmurs, rubs, gallops RESPIRATORY:  Clear to auscultation without rales, wheezing or rhonchi  ABDOMEN: Soft, non-tender, non-distended EXTREMITIES:  *** No edema; No deformity  Wt Readings from Last 3 Encounters:  08/17/24 187 lb (84.8 kg)  08/16/24 188 lb 12.8 oz (85.6 kg)  08/08/24 189 lb (85.7 kg)                  ASSESSMENT & PLAN:   Hypertension   Hyperlipidemia   Hypokalemia    {Are you ordering a CV Procedure (e.g. stress test, cath, DCCV,  TEE, etc)?   Press F2        :[id number]}   Disposition: F/u with Dr. Darliss or an APP in ***.   Medication Adjustments/Labs and Tests Ordered: Current medicines are reviewed at length with the patient today.  Concerns regarding medicines are outlined above. Medication changes, Labs and Tests ordered today are summarized above and listed in the Patient Instructions accessible in Encounters.   Bonney Lesley Maffucci, PA-C 08/22/2024 7:44 AM     [facility] [place] [address] [place], [place] [address] [phone]      [1]  No outpatient medications have been marked as taking for the 08/23/24 encounter (Appointment) with Maffucci Lesley CROME, PA-C.   "

## 2024-08-23 ENCOUNTER — Inpatient Hospital Stay: Attending: Oncology

## 2024-08-23 ENCOUNTER — Ambulatory Visit: Admitting: Physician Assistant

## 2024-08-23 ENCOUNTER — Encounter: Payer: Self-pay | Admitting: Oncology

## 2024-08-23 ENCOUNTER — Inpatient Hospital Stay

## 2024-08-23 ENCOUNTER — Inpatient Hospital Stay: Admitting: Oncology

## 2024-08-23 VITALS — BP 132/82 | HR 81 | Temp 99.3°F | Resp 18 | Ht 64.0 in | Wt 187.0 lb

## 2024-08-23 VITALS — BP 132/79 | HR 86 | Temp 98.5°F | Resp 19

## 2024-08-23 DIAGNOSIS — E876 Hypokalemia: Secondary | ICD-10-CM | POA: Insufficient documentation

## 2024-08-23 DIAGNOSIS — D75839 Thrombocytosis, unspecified: Secondary | ICD-10-CM | POA: Insufficient documentation

## 2024-08-23 DIAGNOSIS — J449 Chronic obstructive pulmonary disease, unspecified: Secondary | ICD-10-CM | POA: Diagnosis not present

## 2024-08-23 DIAGNOSIS — Z803 Family history of malignant neoplasm of breast: Secondary | ICD-10-CM | POA: Diagnosis not present

## 2024-08-23 DIAGNOSIS — H5789 Other specified disorders of eye and adnexa: Secondary | ICD-10-CM | POA: Insufficient documentation

## 2024-08-23 DIAGNOSIS — D72829 Elevated white blood cell count, unspecified: Secondary | ICD-10-CM | POA: Diagnosis not present

## 2024-08-23 DIAGNOSIS — G4730 Sleep apnea, unspecified: Secondary | ICD-10-CM | POA: Insufficient documentation

## 2024-08-23 DIAGNOSIS — Z7951 Long term (current) use of inhaled steroids: Secondary | ICD-10-CM | POA: Insufficient documentation

## 2024-08-23 DIAGNOSIS — Z5111 Encounter for antineoplastic chemotherapy: Secondary | ICD-10-CM | POA: Diagnosis present

## 2024-08-23 DIAGNOSIS — Z79899 Other long term (current) drug therapy: Secondary | ICD-10-CM | POA: Diagnosis not present

## 2024-08-23 DIAGNOSIS — I10 Essential (primary) hypertension: Secondary | ICD-10-CM | POA: Diagnosis not present

## 2024-08-23 DIAGNOSIS — C50911 Malignant neoplasm of unspecified site of right female breast: Secondary | ICD-10-CM | POA: Insufficient documentation

## 2024-08-23 DIAGNOSIS — R739 Hyperglycemia, unspecified: Secondary | ICD-10-CM | POA: Diagnosis not present

## 2024-08-23 DIAGNOSIS — Z17411 Hormone receptor positive with human epidermal growth factor receptor 2 negative status: Secondary | ICD-10-CM | POA: Insufficient documentation

## 2024-08-23 DIAGNOSIS — J45909 Unspecified asthma, uncomplicated: Secondary | ICD-10-CM | POA: Insufficient documentation

## 2024-08-23 DIAGNOSIS — D649 Anemia, unspecified: Secondary | ICD-10-CM | POA: Diagnosis not present

## 2024-08-23 DIAGNOSIS — K219 Gastro-esophageal reflux disease without esophagitis: Secondary | ICD-10-CM | POA: Diagnosis not present

## 2024-08-23 LAB — CMP (CANCER CENTER ONLY)
ALT: 12 U/L (ref 0–44)
AST: 16 U/L (ref 15–41)
Albumin: 3.9 g/dL (ref 3.5–5.0)
Alkaline Phosphatase: 106 U/L (ref 38–126)
Anion gap: 9 (ref 5–15)
BUN: 11 mg/dL (ref 6–20)
CO2: 25 mmol/L (ref 22–32)
Calcium: 8.8 mg/dL — ABNORMAL LOW (ref 8.9–10.3)
Chloride: 109 mmol/L (ref 98–111)
Creatinine: 0.64 mg/dL (ref 0.44–1.00)
GFR, Estimated: >60 mL/min
Glucose, Bld: 121 mg/dL — ABNORMAL HIGH (ref 70–99)
Potassium: 3.7 mmol/L (ref 3.5–5.1)
Sodium: 143 mmol/L (ref 135–145)
Total Bilirubin: 0.2 mg/dL (ref 0.0–1.2)
Total Protein: 6.5 g/dL (ref 6.5–8.1)

## 2024-08-23 LAB — CBC WITH DIFFERENTIAL (CANCER CENTER ONLY)
Abs Immature Granulocytes: 1.43 K/uL — ABNORMAL HIGH (ref 0.00–0.07)
Basophils Absolute: 0.1 K/uL (ref 0.0–0.1)
Basophils Relative: 0 %
Eosinophils Absolute: 0 K/uL (ref 0.0–0.5)
Eosinophils Relative: 0 %
HCT: 26.9 % — ABNORMAL LOW (ref 36.0–46.0)
Hemoglobin: 8.7 g/dL — ABNORMAL LOW (ref 12.0–15.0)
Immature Granulocytes: 10 %
Lymphocytes Relative: 5 %
Lymphs Abs: 0.7 K/uL (ref 0.7–4.0)
MCH: 30.3 pg (ref 26.0–34.0)
MCHC: 32.3 g/dL (ref 30.0–36.0)
MCV: 93.7 fL (ref 80.0–100.0)
Monocytes Absolute: 1.2 K/uL — ABNORMAL HIGH (ref 0.1–1.0)
Monocytes Relative: 9 %
Neutro Abs: 10.8 K/uL — ABNORMAL HIGH (ref 1.7–7.7)
Neutrophils Relative %: 76 %
Platelet Count: 257 K/uL (ref 150–400)
RBC: 2.87 MIL/uL — ABNORMAL LOW (ref 3.87–5.11)
RDW: 15.7 % — ABNORMAL HIGH (ref 11.5–15.5)
Smear Review: NORMAL
WBC Count: 14.2 K/uL — ABNORMAL HIGH (ref 4.0–10.5)
nRBC: 0 % (ref 0.0–0.2)

## 2024-08-23 LAB — SAMPLE TO BLOOD BANK

## 2024-08-23 MED ORDER — FAMOTIDINE IN NACL 20-0.9 MG/50ML-% IV SOLN
20.0000 mg | Freq: Once | INTRAVENOUS | Status: AC
  Administered 2024-08-23: 20 mg via INTRAVENOUS
  Filled 2024-08-23: qty 50

## 2024-08-23 MED ORDER — ONDANSETRON HCL 4 MG/2ML IJ SOLN
8.0000 mg | Freq: Once | INTRAMUSCULAR | Status: AC
  Administered 2024-08-23: 8 mg via INTRAVENOUS

## 2024-08-23 MED ORDER — DEXAMETHASONE SOD PHOSPHATE PF 10 MG/ML IJ SOLN
10.0000 mg | Freq: Once | INTRAMUSCULAR | Status: AC
  Administered 2024-08-23: 10 mg via INTRAVENOUS

## 2024-08-23 MED ORDER — SODIUM CHLORIDE 0.9 % IV SOLN
INTRAVENOUS | Status: DC
  Filled 2024-08-23: qty 250

## 2024-08-23 MED ORDER — SODIUM CHLORIDE 0.9% FLUSH
10.0000 mL | Freq: Once | INTRAVENOUS | Status: AC
  Administered 2024-08-23: 10 mL via INTRAVENOUS
  Filled 2024-08-23: qty 10

## 2024-08-23 MED ORDER — SODIUM CHLORIDE 0.9 % IV SOLN
80.0000 mg/m2 | Freq: Once | INTRAVENOUS | Status: AC
  Administered 2024-08-23: 156 mg via INTRAVENOUS
  Filled 2024-08-23: qty 26

## 2024-08-23 MED ORDER — DIPHENHYDRAMINE HCL 50 MG/ML IJ SOLN
25.0000 mg | Freq: Once | INTRAMUSCULAR | Status: AC
  Administered 2024-08-23: 25 mg via INTRAVENOUS
  Filled 2024-08-23: qty 1

## 2024-08-23 NOTE — Progress Notes (Signed)
 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  INTERVAL HISTORY: Patient returns to clinic today for further evaluation and consideration of cycle 1 of 12 of weekly Taxol .  She had an episode of nausea and vomiting last week, but it resolved within 24 hours.  Likely unrelated to her treatments.  She otherwise feels well.  She has no neurologic complaints.  She denies any recent fevers or illnesses.  She has a good appetite and denies weight loss.  She has no chest pain, shortness of breath, cough, or hemoptysis.  She denies any nausea, vomiting, constipation, or diarrhea.  She has no urinary complaints.  Patient offers no further specific complaints today.  REVIEW OF SYSTEMS:   Review of Systems  Constitutional: Negative.  Negative for fever, malaise/fatigue and weight loss.  Respiratory: Negative.  Negative for cough, hemoptysis and shortness of breath.   Cardiovascular: Negative.  Negative for chest pain and leg swelling.  Gastrointestinal: Negative.  Negative for abdominal pain.  Genitourinary: Negative.  Negative for dysuria.  Musculoskeletal: Negative.  Negative for back pain.  Skin: Negative.  Negative for rash.  Neurological: Negative.  Negative for dizziness, focal weakness and weakness.  Psychiatric/Behavioral: Negative.  The patient is not nervous/anxious.     As per HPI. Otherwise, a complete review of systems is negative.  PAST MEDICAL HISTORY: Past Medical History:  Diagnosis Date   Asthma    Cancer (HCC)    COPD (chronic obstructive pulmonary disease) (HCC)    Deaf    Endometrial  polyp    GERD (gastroesophageal reflux disease)    Hypertension 2011   NO (nasal obstruction)    OSA (obstructive sleep apnea)     PAST SURGICAL HISTORY: Past Surgical History:  Procedure Laterality Date   BREAST BIOPSY Right 06/08/2024   US  RT BREAST BX W LOC DEV 1ST LESION IMG BX SPEC US  GUIDE 06/08/2024 [facility]   CESAREAN SECTION     x 2   CHOLECYSTECTOMY     DENTAL SURGERY Bilateral 04/2024   Removed both fang teeth on the top and wisdom as well.   HYSTEROSCOPY WITH D & C N/A 07/18/2024   Procedure: DILATATION AND CURETTAGE /HYSTEROSCOPY;  Surgeon: Leigh Sober, MD;  Location: [facility];  Service: Gynecology;  Laterality: N/A;   PORTACATH PLACEMENT Left 06/22/2024   Procedure: INSERTION, TUNNELED CENTRAL VENOUS DEVICE, WITH PORT;  Surgeon: Marinda Jayson KIDD, MD;  Location: [facility];  Service: General;  Laterality: Left;   TUBAL LIGATION      FAMILY HISTORY: Family History  Problem Relation Age of Onset   Heart disease Mother    Stroke Mother    Brain cancer Half-Brother        dx 30s   Breast cancer Half-Sister        dx 30s    ADVANCED DIRECTIVES (Y/N):  N  HEALTH MAINTENANCE: Social History   Tobacco Use   Smoking status: Never   Smokeless tobacco: Never  Vaping Use   Vaping status: Never Used  Substance Use Topics   Alcohol use: Not Currently    Alcohol/week: 1.0 standard drink of alcohol    Types: 1 Glasses  of wine per week    Comment: occassionally    Drug use: Not Currently    Types: Crack cocaine     Colonoscopy:  PAP:  Bone density:  Lipid panel:  Allergies  Allergen Reactions   Egg Protein-Containing Drug Products Nausea And Vomiting   Milk-Related Compounds     Current Outpatient Medications  Medication Sig Dispense Refill   albuterol  (VENTOLIN  HFA) 108 (90 Base) MCG/ACT inhaler Inhale 2 puffs into the lungs every 6 (six) hours as needed for wheezing or shortness of breath. 18 g 6   atorvastatin  (LIPITOR) 20 MG tablet Take 1  tablet (20 mg total) by mouth daily. 90 tablet 1   Blood Pressure Monitor KIT Use to check blood pressure daily 1 kit 0   budesonide -formoterol  (SYMBICORT ) 80-4.5 MCG/ACT inhaler Inhale 2 puffs into the lungs in the morning and at bedtime. 1 each 6   carvedilol  (COREG ) 3.125 MG tablet Take 1 tablet (3.125 mg total) by mouth 2 (two) times daily with a meal. 60 tablet 3   ibuprofen  (ADVIL ) 800 MG tablet Take 1 tablet (800 mg total) by mouth every 8 (eight) hours as needed for cramping. 15 tablet 0   levocetirizine (XYZAL ) 5 MG tablet Take 1 tablet (5 mg total) by mouth every evening. 90 tablet 3   lidocaine -prilocaine  (EMLA ) cream Apply to affected area once 30 g 3   Misc. Devices MISC CPAP therapy on 17 cm H2O . Patient to use X-Small size Resmed Full Face Mask AirFit F10. Diagnosis - Obstructive sleep apnea 1 each 0   neomycin-polymyxin b-dexamethasone  (MAXITROL) 3.5-10000-0.1 SUSP SMARTSIG:In Eye(s)     nitroGLYCERIN  (NITROSTAT ) 0.4 MG SL tablet Place 1 tablet (0.4 mg total) under the tongue every 5 (five) minutes as needed for chest pain. 20 tablet 1   omeprazole  (PRILOSEC ) 40 MG capsule Take 1 capsule (40 mg total) by mouth daily. 30 capsule 3   ondansetron  (ZOFRAN ) 8 MG tablet Take 1 tablet (8 mg total) by mouth every 8 (eight) hours as needed for nausea or vomiting. 30 tablet 3   oxyCODONE -acetaminophen  (PERCOCET/ROXICET) 5-325 MG tablet Take 1 tablet by mouth every 8 (eight) hours as needed for severe pain (pain score 7-10). 3 tablet 0   prochlorperazine  (COMPAZINE ) 10 MG tablet Take 1 tablet (10 mg total) by mouth every 6 (six) hours as needed for nausea or vomiting. 30 tablet 3   valsartan  (DIOVAN ) 320 MG tablet Take 1 tablet (320 mg total) by mouth daily. 90 tablet 3   Vitamin D , Ergocalciferol , (DRISDOL ) 1.25 MG (50000 UNIT) CAPS capsule Take 1 capsule (50,000 Units total) by mouth every 7 (seven) days. 12 capsule 1   No current facility-administered medications for this visit.     OBJECTIVE: Vitals:   08/23/24 0903  BP: 132/82  Pulse: 81  Resp: 18  Temp: 99.3 F (37.4 C)  SpO2: 98%     Body mass index is 32.1 kg/m.    ECOG FS:0 - Asymptomatic  General: Well-developed, well-nourished, no acute distress. Eyes: Pink conjunctiva, anicteric sclera. HEENT: Normocephalic, moist mucous membranes. Lungs: No audible wheezing or coughing. Heart: Regular rate and rhythm. Abdomen: Soft, nontender, no obvious distention. Musculoskeletal: No edema, cyanosis, or clubbing. Neuro: Alert, answering all questions appropriately. Cranial nerves grossly intact. Skin: No rashes or petechiae noted. Psych: Normal affect.  LAB RESULTS:  Lab Results  Component Value Date   NA 143 08/23/2024   K 3.7 08/23/2024   CL 109 08/23/2024   CO2 25 08/23/2024  GLUCOSE 121 (H) 08/23/2024   BUN 11 08/23/2024   CREATININE 0.64 08/23/2024   CALCIUM  8.8 (L) 08/23/2024   PROT 6.5 08/23/2024   ALBUMIN 3.9 08/23/2024   AST 16 08/23/2024   ALT 12 08/23/2024   ALKPHOS 106 08/23/2024   BILITOT 0.2 08/23/2024   GFRNONAA >60 08/23/2024   GFRAA 82 01/05/2020    Lab Results  Component Value Date   WBC 14.2 (H) 08/23/2024   NEUTROABS PENDING 08/23/2024   HGB 8.7 (L) 08/23/2024   HCT 26.9 (L) 08/23/2024   MCV 93.7 08/23/2024   PLT 257 08/23/2024     STUDIES: No results found.   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  After lengthy discussion with the patient, she agreed to proceed with neoadjuvant chemotherapy with Adriamycin  and Cytoxan  x 4 with GCF support followed by weekly Taxol  x 12.  This will then be followed by lumpectomy and adjuvant XRT.  After completion of all her treatments, she will benefit from 5 years of letrozole given the ER  positivity.  MUGA scan from June 24, 2024 revealed an EF of 57%.  Patient has had port placement.  Patient has now completed Adriamycin  and Cytoxan .  Proceed with cycle 1 of 12 of weekly Taxol  today.  Return to clinic in 1 week for further evaluation and consideration of cycle 2.   Chest pain/palpitations/hypertension: Blood pressure is within normal limits today.  Continue follow-up with cardiology as scheduled. Leukocytosis: Reactive from G-CSF support.  Monitor.   Anemia: Hemoglobin is trended down to 8.7.  Monitor.  Previously, iron stores, folate, and B12 were all within normal limits.  The entire visit was done in the presence of an ASL interpreter.  Patient expressed understanding and was in agreement with this plan. She also understands that She can call clinic at any time with any questions, concerns, or complaints.    Cancer Staging  Invasive ductal carcinoma of right breast [facility]) Staging form: Breast, AJCC 8th Edition - Clinical stage from 06/16/2024: Stage IIB (cT2, cN0, cM0, G3, ER+, PR-, HER2-) - Signed by Jacobo Evalene PARAS, MD on 06/16/2024 Stage prefix: Initial diagnosis Histologic grading system: 3 grade system   Evalene PARAS Jacobo, MD   08/23/2024 9:10 AM      "

## 2024-08-23 NOTE — Progress Notes (Signed)
 Patient received first infusion of taxol  today. Pt was titrated per order and tolerated infusion well. Pt educated via interpreter about using cold therapy during taxol  infusions in the future to help with peripheral neuropathy side effects during the taxol  infusion. Pt said she did not want to start cold therapy at this time unless she started to develop some neuropathic symptoms. Pt verified understanding, was educated about next appointments, and was stable at discharge.

## 2024-08-23 NOTE — Progress Notes (Signed)
 Patient don't like taking the carvedilol  because it makes her heart race and it scares her. She is having some nausea but she is going to try taking the compazine  to see if it helps.

## 2024-08-30 ENCOUNTER — Inpatient Hospital Stay

## 2024-08-30 ENCOUNTER — Inpatient Hospital Stay: Admitting: Oncology

## 2024-08-30 ENCOUNTER — Encounter: Payer: Self-pay | Admitting: Oncology

## 2024-08-30 VITALS — BP 124/71 | HR 81 | Resp 18

## 2024-08-30 VITALS — BP 116/68 | HR 73 | Temp 97.5°F | Resp 18 | Ht 64.0 in | Wt 185.0 lb

## 2024-08-30 DIAGNOSIS — C50911 Malignant neoplasm of unspecified site of right female breast: Secondary | ICD-10-CM

## 2024-08-30 DIAGNOSIS — Z5111 Encounter for antineoplastic chemotherapy: Secondary | ICD-10-CM | POA: Diagnosis not present

## 2024-08-30 DIAGNOSIS — D649 Anemia, unspecified: Secondary | ICD-10-CM

## 2024-08-30 LAB — CBC WITH DIFFERENTIAL (CANCER CENTER ONLY)
Abs Immature Granulocytes: 0.04 K/uL (ref 0.00–0.07)
Basophils Absolute: 0 K/uL (ref 0.0–0.1)
Basophils Relative: 0 %
Eosinophils Absolute: 0 K/uL (ref 0.0–0.5)
Eosinophils Relative: 0 %
HCT: 26 % — ABNORMAL LOW (ref 36.0–46.0)
Hemoglobin: 8.6 g/dL — ABNORMAL LOW (ref 12.0–15.0)
Immature Granulocytes: 1 %
Lymphocytes Relative: 9 %
Lymphs Abs: 0.4 K/uL — ABNORMAL LOW (ref 0.7–4.0)
MCH: 30.6 pg (ref 26.0–34.0)
MCHC: 33.1 g/dL (ref 30.0–36.0)
MCV: 92.5 fL (ref 80.0–100.0)
Monocytes Absolute: 0.5 K/uL (ref 0.1–1.0)
Monocytes Relative: 11 %
Neutro Abs: 3.7 K/uL (ref 1.7–7.7)
Neutrophils Relative %: 79 %
Platelet Count: 406 K/uL — ABNORMAL HIGH (ref 150–400)
RBC: 2.81 MIL/uL — ABNORMAL LOW (ref 3.87–5.11)
RDW: 15 % (ref 11.5–15.5)
WBC Count: 4.7 K/uL (ref 4.0–10.5)
nRBC: 0 % (ref 0.0–0.2)

## 2024-08-30 LAB — CMP (CANCER CENTER ONLY)
ALT: 20 U/L (ref 0–44)
AST: 19 U/L (ref 15–41)
Albumin: 3.8 g/dL (ref 3.5–5.0)
Alkaline Phosphatase: 79 U/L (ref 38–126)
Anion gap: 11 (ref 5–15)
BUN: 8 mg/dL (ref 6–20)
CO2: 20 mmol/L — ABNORMAL LOW (ref 22–32)
Calcium: 8.4 mg/dL — ABNORMAL LOW (ref 8.9–10.3)
Chloride: 107 mmol/L (ref 98–111)
Creatinine: 0.64 mg/dL (ref 0.44–1.00)
GFR, Estimated: >60 mL/min
Glucose, Bld: 200 mg/dL — ABNORMAL HIGH (ref 70–99)
Potassium: 3.3 mmol/L — ABNORMAL LOW (ref 3.5–5.1)
Sodium: 139 mmol/L (ref 135–145)
Total Bilirubin: <0.2 mg/dL (ref 0.0–1.2)
Total Protein: 6.2 g/dL — ABNORMAL LOW (ref 6.5–8.1)

## 2024-08-30 LAB — SAMPLE TO BLOOD BANK

## 2024-08-30 MED ORDER — DIPHENHYDRAMINE HCL 50 MG/ML IJ SOLN
25.0000 mg | Freq: Once | INTRAMUSCULAR | Status: AC
  Administered 2024-08-30: 25 mg via INTRAVENOUS
  Filled 2024-08-30: qty 1

## 2024-08-30 MED ORDER — SODIUM CHLORIDE 0.9 % IV SOLN
80.0000 mg/m2 | Freq: Once | INTRAVENOUS | Status: AC
  Administered 2024-08-30: 156 mg via INTRAVENOUS
  Filled 2024-08-30: qty 26

## 2024-08-30 MED ORDER — SODIUM CHLORIDE 0.9 % IV SOLN
INTRAVENOUS | Status: DC
  Filled 2024-08-30: qty 250

## 2024-08-30 MED ORDER — FAMOTIDINE IN NACL 20-0.9 MG/50ML-% IV SOLN
20.0000 mg | Freq: Once | INTRAVENOUS | Status: AC
  Administered 2024-08-30: 20 mg via INTRAVENOUS
  Filled 2024-08-30: qty 50

## 2024-08-30 MED ORDER — DEXAMETHASONE SOD PHOSPHATE PF 10 MG/ML IJ SOLN
10.0000 mg | Freq: Once | INTRAMUSCULAR | Status: AC
  Administered 2024-08-30: 10 mg via INTRAVENOUS
  Filled 2024-08-30: qty 1

## 2024-08-30 NOTE — Progress Notes (Signed)
 Nutrition Follow-up:  Patient with breast cancer. Followed by Dr Jacobo.  Completed neoadjuvant AC now starting taxol .    Met with patient during infusion.  Interpreter present during interview.  Reports that her appetite is better.  She is eating more.  Had oatmeal and fruit for breakfast.  Lunch is salad with cheese, egg, seeds, croutons and vegetables.  Dinner is chicken, or fish and vegetables.  Drinks water mostly.      Medications: reviewed  Labs: reviewed  Anthropometrics:   Weight 185 lb  190 lb on 12/1 UBW of 187-192 lb  NUTRITION DIAGNOSIS: None at this time    INTERVENTION:  Continue lean protein foods and plant foods Patient to contact RD if needed  MONITORING, EVALUATION, GOAL: weight trends, intake   NEXT VISIT: no follow-up RD available as needed  Taria Castrillo B. Dasie SOLON, CSO, LDN Registered Dietitian [phone]

## 2024-08-30 NOTE — Progress Notes (Signed)
 Patient is doing ok, she is walking more and she is trying to eat better. No new questions

## 2024-08-30 NOTE — Patient Instructions (Signed)
 [facility] - A DEPT OF [facility]  Discharge Instructions: Thank you for choosing [facility] to provide your oncology and hematology care.  If you have a lab appointment with the Cancer Center, please go directly to the Cancer Center and check in at the registration area.  Wear comfortable clothing and clothing appropriate for easy access to any Portacath or PICC line.   We strive to give you quality time with your provider. You may need to reschedule your appointment if you arrive late (15 or more minutes).  Arriving late affects you and other patients whose appointments are after yours.  Also, if you miss three or more appointments without notifying the office, you may be dismissed from the clinic at the provider's discretion.      For prescription refill requests, have your pharmacy contact our office and allow 72 hours for refills to be completed.    Today you received the following chemotherapy and/or immunotherapy agents TAXOL        To help prevent nausea and vomiting after your treatment, we encourage you to take your nausea medication as directed.  BELOW ARE SYMPTOMS THAT SHOULD BE REPORTED IMMEDIATELY: *FEVER GREATER THAN 100.4 F (38 C) OR HIGHER *CHILLS OR SWEATING *NAUSEA AND VOMITING THAT IS NOT CONTROLLED WITH YOUR NAUSEA MEDICATION *UNUSUAL SHORTNESS OF BREATH *UNUSUAL BRUISING OR BLEEDING *URINARY PROBLEMS (pain or burning when urinating, or frequent urination) *BOWEL PROBLEMS (unusual diarrhea, constipation, pain near the anus) TENDERNESS IN MOUTH AND THROAT WITH OR WITHOUT PRESENCE OF ULCERS (sore throat, sores in mouth, or a toothache) UNUSUAL RASH, SWELLING OR PAIN  UNUSUAL VAGINAL DISCHARGE OR ITCHING   Items with * indicate a potential emergency and should be followed up as soon as possible or go to the Emergency Department if any problems should occur.  Please show the CHEMOTHERAPY ALERT CARD or IMMUNOTHERAPY  ALERT CARD at check-in to the Emergency Department and triage nurse.  Should you have questions after your visit or need to cancel or reschedule your appointment, please contact [facility] - A DEPT OF [facility]  [phone] and follow the prompts.  Office hours are 8:00 a.m. to 4:30 p.m. Monday - Friday. Please note that voicemails left after 4:00 p.m. may not be returned until the following business day.  We are closed weekends and major holidays. You have access to a nurse at all times for urgent questions. Please call the main number to the clinic [phone] and follow the prompts.  For any non-urgent questions, you may also contact your provider using MyChart. We now offer e-Visits for anyone 98 and older to request care online for non-urgent symptoms. For details visit [email or web address].   Also download the MyChart app! Go to the app store, search "MyChart", open the app, select [place], and log in with your MyChart username and password.  Paclitaxel  Injection What is this medication? PACLITAXEL  (PAK li TAX el) treats some types of cancer. It works by slowing down the growth of cancer cells. This medicine may be used for other purposes; ask your health care provider or pharmacist if you have questions. COMMON BRAND NAME(S): Onxol, Taxol  What should I tell my care team before I take this medication? They need to know if you have any of these conditions: Heart disease Liver disease Low white blood cell levels An unusual or allergic reaction to paclitaxel , other medications, foods, dyes, or preservatives If you  or your partner are pregnant or trying to get pregnant Breast-feeding How should I use this medication? This medication is injected into a vein. It is given by your care team in a hospital or clinic setting. Talk to your care team about the use of this medication in children. While it may be given to children for selected conditions,  precautions do apply. Overdosage: If you think you have taken too much of this medicine contact a poison control center or emergency room at once. NOTE: This medicine is only for you. Do not share this medicine with others. What if I miss a dose? Keep appointments for follow-up doses. It is important not to miss your dose. Call your care team if you are unable to keep an appointment. What may interact with this medication? Do not take this medication with any of the following: Live virus vaccines Other medications may affect the way this medication works. Talk with your care team about all of the medications you take. They may suggest changes to your treatment plan to lower the risk of side effects and to make sure your medications work as intended. This list may not describe all possible interactions. Give your health care provider a list of all the medicines, herbs, non-prescription drugs, or dietary supplements you use. Also tell them if you smoke, drink alcohol, or use illegal drugs. Some items may interact with your medicine. What should I watch for while using this medication? Your condition will be monitored carefully while you are receiving this medication. You may need blood work while taking this medication. This medication may make you feel generally unwell. This is not uncommon as chemotherapy can affect healthy cells as well as cancer cells. Report any side effects. Continue your course of treatment even though you feel ill unless your care team tells you to stop. This medication can cause serious allergic reactions. To reduce the risk, your care team may give you other medications to take before receiving this one. Be sure to follow the directions from your care team. This medication may increase your risk of getting an infection. Call your care team for advice if you get a fever, chills, sore throat, or other symptoms of a cold or flu. Do not treat yourself. Try to avoid being around  people who are sick. This medication may increase your risk to bruise or bleed. Call your care team if you notice any unusual bleeding. Be careful brushing or flossing your teeth or using a toothpick because you may get an infection or bleed more easily. If you have any dental work done, tell your dentist you are receiving this medication. Talk to your care team if you may be pregnant. Serious birth defects can occur if you take this medication during pregnancy. Talk to your care team before breastfeeding. Changes to your treatment plan may be needed. What side effects may I notice from receiving this medication? Side effects that you should report to your care team as soon as possible: Allergic reactions--skin rash, itching, hives, swelling of the face, lips, tongue, or throat Heart rhythm changes--fast or irregular heartbeat, dizziness, feeling faint or lightheaded, chest pain, trouble breathing Increase in blood pressure Infection--fever, chills, cough, sore throat, wounds that don't heal, pain or trouble when passing urine, general feeling of discomfort or being unwell Low blood pressure--dizziness, feeling faint or lightheaded, blurry vision Low red blood cell level--unusual weakness or fatigue, dizziness, headache, trouble breathing Painful swelling, warmth, or redness of the skin, blisters or sores  at the infusion site Pain, tingling, or numbness in the hands or feet Slow heartbeat--dizziness, feeling faint or lightheaded, confusion, trouble breathing, unusual weakness or fatigue Unusual bruising or bleeding Side effects that usually do not require medical attention (report to your care team if they continue or are bothersome): Diarrhea Hair loss Joint pain Loss of appetite Muscle pain Nausea Vomiting This list may not describe all possible side effects. Call your doctor for medical advice about side effects. You may report side effects to FDA at 1-800-FDA-1088. Where should I keep  my medication? This medication is given in a hospital or clinic. It will not be stored at home. NOTE: This sheet is a summary. It may not cover all possible information. If you have questions about this medicine, talk to your doctor, pharmacist, or health care provider.  2024 Elsevier/Gold Standard (2021-12-24 00:00:00)

## 2024-08-30 NOTE — Progress Notes (Signed)
 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  INTERVAL HISTORY: Patient returns to clinic today for further evaluation and consideration of cycle 2 of 12 of weekly Taxol .  She tolerated her first infusion well without significant side effects.  She currently feels well and is asymptomatic.  She has no neurologic complaints.  She denies any recent fevers or illnesses.  She has a good appetite and denies weight loss.  She has no chest pain, shortness of breath, cough, or hemoptysis.  She denies any nausea, vomiting, constipation, or diarrhea.  She has no urinary complaints.  Patient offers no specific complaints today.  REVIEW OF SYSTEMS:   Review of Systems  Constitutional: Negative.  Negative for fever, malaise/fatigue and weight loss.  Respiratory: Negative.  Negative for cough, hemoptysis and shortness of breath.   Cardiovascular: Negative.  Negative for chest pain and leg swelling.  Gastrointestinal: Negative.  Negative for abdominal pain.  Genitourinary: Negative.  Negative for dysuria.  Musculoskeletal: Negative.  Negative for back pain.  Skin: Negative.  Negative for rash.  Neurological: Negative.  Negative for dizziness, focal weakness and weakness.  Psychiatric/Behavioral: Negative.  The patient is not nervous/anxious.     As per HPI. Otherwise, a complete review of systems is negative.  PAST MEDICAL HISTORY: Past Medical History:  Diagnosis Date   Asthma    Cancer (HCC)    COPD (chronic obstructive pulmonary disease) (HCC)    Deaf    Endometrial polyp    GERD (gastroesophageal reflux  disease)    Hypertension 2011   NO (nasal obstruction)    OSA (obstructive sleep apnea)     PAST SURGICAL HISTORY: Past Surgical History:  Procedure Laterality Date   BREAST BIOPSY Right 06/08/2024   US  RT BREAST BX W LOC DEV 1ST LESION IMG BX SPEC US  GUIDE 06/08/2024 [facility]   CESAREAN SECTION     x 2   CHOLECYSTECTOMY     DENTAL SURGERY Bilateral 04/2024   Removed both fang teeth on the top and wisdom as well.   HYSTEROSCOPY WITH D & C N/A 07/18/2024   Procedure: DILATATION AND CURETTAGE /HYSTEROSCOPY;  Surgeon: Leigh Sober, MD;  Location: [facility];  Service: Gynecology;  Laterality: N/A;   PORTACATH PLACEMENT Left 06/22/2024   Procedure: INSERTION, TUNNELED CENTRAL VENOUS DEVICE, WITH PORT;  Surgeon: Marinda Jayson KIDD, MD;  Location: [facility];  Service: General;  Laterality: Left;   TUBAL LIGATION      FAMILY HISTORY: Family History  Problem Relation Age of Onset   Heart disease Mother    Stroke Mother    Brain cancer Half-Brother        dx 30s   Breast cancer Half-Sister        dx 30s    ADVANCED DIRECTIVES (Y/N):  N  HEALTH MAINTENANCE: Social History   Tobacco Use   Smoking status: Never   Smokeless tobacco: Never  Vaping Use   Vaping status: Never Used  Substance Use Topics   Alcohol use: Not Currently    Alcohol/week: 1.0 standard drink of alcohol    Types: 1 Glasses of wine per week    Comment: occassionally  Drug use: Not Currently    Types: Crack cocaine     Colonoscopy:  PAP:  Bone density:  Lipid panel:  Allergies  Allergen Reactions   Egg Protein-Containing Drug Products Nausea And Vomiting   Milk-Related Compounds     Current Outpatient Medications  Medication Sig Dispense Refill   albuterol  (VENTOLIN  HFA) 108 (90 Base) MCG/ACT inhaler Inhale 2 puffs into the lungs every 6 (six) hours as needed for wheezing or shortness of breath. 18 g 6   atorvastatin  (LIPITOR) 20 MG tablet Take 1 tablet (20 mg total) by mouth daily. 90  tablet 1   Blood Pressure Monitor KIT Use to check blood pressure daily 1 kit 0   budesonide -formoterol  (SYMBICORT ) 80-4.5 MCG/ACT inhaler Inhale 2 puffs into the lungs in the morning and at bedtime. 1 each 6   carvedilol  (COREG ) 3.125 MG tablet Take 1 tablet (3.125 mg total) by mouth 2 (two) times daily with a meal. 60 tablet 3   ibuprofen  (ADVIL ) 800 MG tablet Take 1 tablet (800 mg total) by mouth every 8 (eight) hours as needed for cramping. 15 tablet 0   levocetirizine (XYZAL ) 5 MG tablet Take 1 tablet (5 mg total) by mouth every evening. 90 tablet 3   lidocaine -prilocaine  (EMLA ) cream Apply to affected area once 30 g 3   Misc. Devices MISC CPAP therapy on 17 cm H2O . Patient to use X-Small size Resmed Full Face Mask AirFit F10. Diagnosis - Obstructive sleep apnea 1 each 0   neomycin-polymyxin b-dexamethasone  (MAXITROL) 3.5-10000-0.1 SUSP SMARTSIG:In Eye(s)     nitroGLYCERIN  (NITROSTAT ) 0.4 MG SL tablet Place 1 tablet (0.4 mg total) under the tongue every 5 (five) minutes as needed for chest pain. 20 tablet 1   omeprazole  (PRILOSEC ) 40 MG capsule Take 1 capsule (40 mg total) by mouth daily. 30 capsule 3   ondansetron  (ZOFRAN ) 8 MG tablet Take 1 tablet (8 mg total) by mouth every 8 (eight) hours as needed for nausea or vomiting. 30 tablet 3   oxyCODONE -acetaminophen  (PERCOCET/ROXICET) 5-325 MG tablet Take 1 tablet by mouth every 8 (eight) hours as needed for severe pain (pain score 7-10). 3 tablet 0   prochlorperazine  (COMPAZINE ) 10 MG tablet Take 1 tablet (10 mg total) by mouth every 6 (six) hours as needed for nausea or vomiting. 30 tablet 3   valsartan  (DIOVAN ) 320 MG tablet Take 1 tablet (320 mg total) by mouth daily. 90 tablet 3   Vitamin D , Ergocalciferol , (DRISDOL ) 1.25 MG (50000 UNIT) CAPS capsule Take 1 capsule (50,000 Units total) by mouth every 7 (seven) days. 12 capsule 1   No current facility-administered medications for this visit.    OBJECTIVE: Vitals:   08/30/24 0914  BP:  116/68  Pulse: 73  Resp: 18  Temp: (!) 97.5 F (36.4 C)  SpO2: 99%     Body mass index is 31.76 kg/m.    ECOG FS:0 - Asymptomatic  General: Well-developed, well-nourished, no acute distress. Eyes: Pink conjunctiva, anicteric sclera. HEENT: Normocephalic, moist mucous membranes. Lungs: No audible wheezing or coughing. Heart: Regular rate and rhythm. Abdomen: Soft, nontender, no obvious distention. Musculoskeletal: No edema, cyanosis, or clubbing. Neuro: Alert, answering all questions appropriately. Cranial nerves grossly intact. Skin: No rashes or petechiae noted. Psych: Normal affect.  LAB RESULTS:  Lab Results  Component Value Date   NA 139 08/30/2024   K 3.3 (L) 08/30/2024   CL 107 08/30/2024   CO2 20 (L) 08/30/2024   GLUCOSE 200 (H) 08/30/2024   BUN 8  08/30/2024   CREATININE 0.64 08/30/2024   CALCIUM  8.4 (L) 08/30/2024   PROT 6.2 (L) 08/30/2024   ALBUMIN 3.8 08/30/2024   AST 19 08/30/2024   ALT 20 08/30/2024   ALKPHOS 79 08/30/2024   BILITOT <0.2 08/30/2024   GFRNONAA >60 08/30/2024   GFRAA 82 01/05/2020    Lab Results  Component Value Date   WBC 4.7 08/30/2024   NEUTROABS 3.7 08/30/2024   HGB 8.6 (L) 08/30/2024   HCT 26.0 (L) 08/30/2024   MCV 92.5 08/30/2024   PLT 406 (H) 08/30/2024     STUDIES: No results found.   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  After lengthy discussion with the patient, she agreed to proceed with neoadjuvant chemotherapy with Adriamycin  and Cytoxan  x 4 with GCF support followed by weekly Taxol  x 12.  This will then be followed by lumpectomy and adjuvant XRT.  After completion of all her treatments, she will benefit from 5 years of letrozole given the ER positivity.  MUGA scan from June 24, 2024  revealed an EF of 57%.  Patient has had port placement.  Patient has now completed Adriamycin  and Cytoxan .  Proceed with cycle 2 of 12 weekly Taxol  today.  Return to clinic in 1 week for further evaluation and consideration of cycle 3.   Chest pain/palpitations/hypertension: Resolved.  Continue follow-up with cardiology as scheduled. Hypokalemia: Mild, monitor.  Patient was given dietary recommendations. Leukocytosis: Resolved.   Anemia: Chronic and unchanged.  Patient's hemoglobin is 8.6 today.  Monitor.  Previously, iron stores, folate, and B12 were all within normal limits. Thrombocytosis: Likely reactive, monitor.  The entire visit was done in the presence of an ASL interpreter.  Patient expressed understanding and was in agreement with this plan. She also understands that She can call clinic at any time with any questions, concerns, or complaints.    Cancer Staging  Invasive ductal carcinoma of right breast [facility]) Staging form: Breast, AJCC 8th Edition - Clinical stage from 06/16/2024: Stage IIB (cT2, cN0, cM0, G3, ER+, PR-, HER2-) - Signed by Jacobo Evalene PARAS, MD on 06/16/2024 Stage prefix: Initial diagnosis Histologic grading system: 3 grade system   Evalene PARAS Jacobo, MD   08/30/2024 1:03 PM      "

## 2024-09-06 ENCOUNTER — Encounter: Payer: Self-pay | Admitting: Oncology

## 2024-09-06 ENCOUNTER — Inpatient Hospital Stay: Admitting: Oncology

## 2024-09-06 ENCOUNTER — Inpatient Hospital Stay

## 2024-09-06 VITALS — BP 129/59 | HR 84 | Temp 98.2°F | Resp 20 | Wt 185.6 lb

## 2024-09-06 VITALS — BP 125/71 | HR 87 | Temp 97.8°F | Resp 19

## 2024-09-06 DIAGNOSIS — Z5111 Encounter for antineoplastic chemotherapy: Secondary | ICD-10-CM | POA: Diagnosis not present

## 2024-09-06 DIAGNOSIS — C50911 Malignant neoplasm of unspecified site of right female breast: Secondary | ICD-10-CM | POA: Diagnosis not present

## 2024-09-06 LAB — CBC WITH DIFFERENTIAL (CANCER CENTER ONLY)
Abs Immature Granulocytes: 0.03 K/uL (ref 0.00–0.07)
Basophils Absolute: 0 K/uL (ref 0.0–0.1)
Basophils Relative: 1 %
Eosinophils Absolute: 0 K/uL (ref 0.0–0.5)
Eosinophils Relative: 1 %
HCT: 27.1 % — ABNORMAL LOW (ref 36.0–46.0)
Hemoglobin: 8.9 g/dL — ABNORMAL LOW (ref 12.0–15.0)
Immature Granulocytes: 1 %
Lymphocytes Relative: 9 %
Lymphs Abs: 0.4 K/uL — ABNORMAL LOW (ref 0.7–4.0)
MCH: 30.3 pg (ref 26.0–34.0)
MCHC: 32.8 g/dL (ref 30.0–36.0)
MCV: 92.2 fL (ref 80.0–100.0)
Monocytes Absolute: 0.4 K/uL (ref 0.1–1.0)
Monocytes Relative: 8 %
Neutro Abs: 3.6 K/uL (ref 1.7–7.7)
Neutrophils Relative %: 80 %
Platelet Count: 340 K/uL (ref 150–400)
RBC: 2.94 MIL/uL — ABNORMAL LOW (ref 3.87–5.11)
RDW: 15.1 % (ref 11.5–15.5)
WBC Count: 4.4 K/uL (ref 4.0–10.5)
nRBC: 0 % (ref 0.0–0.2)

## 2024-09-06 LAB — CMP (CANCER CENTER ONLY)
ALT: 25 U/L (ref 0–44)
AST: 19 U/L (ref 15–41)
Albumin: 3.9 g/dL (ref 3.5–5.0)
Alkaline Phosphatase: 85 U/L (ref 38–126)
Anion gap: 12 (ref 5–15)
BUN: 11 mg/dL (ref 6–20)
CO2: 23 mmol/L (ref 22–32)
Calcium: 9 mg/dL (ref 8.9–10.3)
Chloride: 104 mmol/L (ref 98–111)
Creatinine: 0.68 mg/dL (ref 0.44–1.00)
GFR, Estimated: >60 mL/min
Glucose, Bld: 200 mg/dL — ABNORMAL HIGH (ref 70–99)
Potassium: 3 mmol/L — ABNORMAL LOW (ref 3.5–5.1)
Sodium: 139 mmol/L (ref 135–145)
Total Bilirubin: 0.3 mg/dL (ref 0.0–1.2)
Total Protein: 6.6 g/dL (ref 6.5–8.1)

## 2024-09-06 MED ORDER — POTASSIUM CHLORIDE 10 MEQ/100ML IV SOLN
10.0000 meq | INTRAVENOUS | Status: AC
  Administered 2024-09-06 (×2): 10 meq via INTRAVENOUS
  Filled 2024-09-06: qty 100

## 2024-09-06 MED ORDER — SODIUM CHLORIDE 0.9 % IV SOLN
80.0000 mg/m2 | Freq: Once | INTRAVENOUS | Status: AC
  Administered 2024-09-06: 156 mg via INTRAVENOUS
  Filled 2024-09-06: qty 26

## 2024-09-06 MED ORDER — DEXAMETHASONE SOD PHOSPHATE PF 10 MG/ML IJ SOLN
10.0000 mg | Freq: Once | INTRAMUSCULAR | Status: AC
  Administered 2024-09-06: 10 mg via INTRAVENOUS
  Filled 2024-09-06: qty 1

## 2024-09-06 MED ORDER — DIPHENHYDRAMINE HCL 50 MG/ML IJ SOLN
25.0000 mg | Freq: Once | INTRAMUSCULAR | Status: AC
  Administered 2024-09-06: 25 mg via INTRAVENOUS
  Filled 2024-09-06: qty 1

## 2024-09-06 MED ORDER — FAMOTIDINE IN NACL 20-0.9 MG/50ML-% IV SOLN
20.0000 mg | Freq: Once | INTRAVENOUS | Status: AC
  Administered 2024-09-06: 20 mg via INTRAVENOUS
  Filled 2024-09-06: qty 50

## 2024-09-06 MED ORDER — SODIUM CHLORIDE 0.9 % IV SOLN
INTRAVENOUS | Status: DC
  Filled 2024-09-06: qty 250

## 2024-09-06 NOTE — Progress Notes (Signed)
Patient wants to know if she can go to the dentist

## 2024-09-06 NOTE — Progress Notes (Signed)
 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  INTERVAL HISTORY: Patient returns to clinic today for further evaluation and consideration of cycle 3 of 12 of weekly Taxol .  She is tolerating her treatments well without significant side effects.  She currently feels well and is asymptomatic.  She has no neurologic complaints.  She denies any recent fevers or illnesses.  She has a good appetite and denies weight loss.  She has no chest pain, shortness of breath, cough, or hemoptysis.  She denies any nausea, vomiting, constipation, or diarrhea.  She has no urinary complaints.  Patient offers no specific complaints today.  REVIEW OF SYSTEMS:   Review of Systems  Constitutional: Negative.  Negative for fever, malaise/fatigue and weight loss.  Respiratory: Negative.  Negative for cough, hemoptysis and shortness of breath.   Cardiovascular: Negative.  Negative for chest pain and leg swelling.  Gastrointestinal: Negative.  Negative for abdominal pain.  Genitourinary: Negative.  Negative for dysuria.  Musculoskeletal: Negative.  Negative for back pain.  Skin: Negative.  Negative for rash.  Neurological: Negative.  Negative for dizziness, focal weakness and weakness.  Psychiatric/Behavioral: Negative.  The patient is not nervous/anxious.     As per HPI. Otherwise, a complete review of systems is negative.  PAST MEDICAL HISTORY: Past Medical History:  Diagnosis Date   Asthma    Cancer (HCC)    COPD (chronic obstructive pulmonary disease) (HCC)    Deaf    Endometrial polyp    GERD (gastroesophageal reflux  disease)    Hypertension 2011   NO (nasal obstruction)    OSA (obstructive sleep apnea)     PAST SURGICAL HISTORY: Past Surgical History:  Procedure Laterality Date   BREAST BIOPSY Right 06/08/2024   US  RT BREAST BX W LOC DEV 1ST LESION IMG BX SPEC US  GUIDE 06/08/2024 [facility]   CESAREAN SECTION     x 2   CHOLECYSTECTOMY     DENTAL SURGERY Bilateral 04/2024   Removed both fang teeth on the top and wisdom as well.   HYSTEROSCOPY WITH D & C N/A 07/18/2024   Procedure: DILATATION AND CURETTAGE /HYSTEROSCOPY;  Surgeon: Leigh Sober, MD;  Location: [facility];  Service: Gynecology;  Laterality: N/A;   PORTACATH PLACEMENT Left 06/22/2024   Procedure: INSERTION, TUNNELED CENTRAL VENOUS DEVICE, WITH PORT;  Surgeon: Marinda Jayson KIDD, MD;  Location: [facility];  Service: General;  Laterality: Left;   TUBAL LIGATION      FAMILY HISTORY: Family History  Problem Relation Age of Onset   Heart disease Mother    Stroke Mother    Brain cancer Half-Brother        dx 30s   Breast cancer Half-Sister        dx 30s    ADVANCED DIRECTIVES (Y/N):  N  HEALTH MAINTENANCE: Social History   Tobacco Use   Smoking status: Never   Smokeless tobacco: Never  Vaping Use   Vaping status: Never Used  Substance Use Topics   Alcohol use: Not Currently    Alcohol/week: 1.0 standard drink of alcohol    Types: 1 Glasses of wine per week    Comment: occassionally  Drug use: Not Currently    Types: Crack cocaine     Colonoscopy:  PAP:  Bone density:  Lipid panel:  Allergies  Allergen Reactions   Egg Protein-Containing Drug Products Nausea And Vomiting   Milk-Related Compounds     Current Outpatient Medications  Medication Sig Dispense Refill   albuterol  (VENTOLIN  HFA) 108 (90 Base) MCG/ACT inhaler Inhale 2 puffs into the lungs every 6 (six) hours as needed for wheezing or shortness of breath. 18 g 6   atorvastatin  (LIPITOR) 20 MG tablet Take 1 tablet (20 mg total) by mouth daily. 90  tablet 1   Blood Pressure Monitor KIT Use to check blood pressure daily 1 kit 0   budesonide -formoterol  (SYMBICORT ) 80-4.5 MCG/ACT inhaler Inhale 2 puffs into the lungs in the morning and at bedtime. 1 each 6   carvedilol  (COREG ) 3.125 MG tablet Take 1 tablet (3.125 mg total) by mouth 2 (two) times daily with a meal. 60 tablet 3   ibuprofen  (ADVIL ) 800 MG tablet Take 1 tablet (800 mg total) by mouth every 8 (eight) hours as needed for cramping. 15 tablet 0   levocetirizine (XYZAL ) 5 MG tablet Take 1 tablet (5 mg total) by mouth every evening. 90 tablet 3   lidocaine -prilocaine  (EMLA ) cream Apply to affected area once 30 g 3   Misc. Devices MISC CPAP therapy on 17 cm H2O . Patient to use X-Small size Resmed Full Face Mask AirFit F10. Diagnosis - Obstructive sleep apnea 1 each 0   neomycin-polymyxin b-dexamethasone  (MAXITROL) 3.5-10000-0.1 SUSP SMARTSIG:In Eye(s)     nitroGLYCERIN  (NITROSTAT ) 0.4 MG SL tablet Place 1 tablet (0.4 mg total) under the tongue every 5 (five) minutes as needed for chest pain. 20 tablet 1   omeprazole  (PRILOSEC ) 40 MG capsule Take 1 capsule (40 mg total) by mouth daily. 30 capsule 3   ondansetron  (ZOFRAN ) 8 MG tablet Take 1 tablet (8 mg total) by mouth every 8 (eight) hours as needed for nausea or vomiting. 30 tablet 3   oxyCODONE -acetaminophen  (PERCOCET/ROXICET) 5-325 MG tablet Take 1 tablet by mouth every 8 (eight) hours as needed for severe pain (pain score 7-10). 3 tablet 0   prochlorperazine  (COMPAZINE ) 10 MG tablet Take 1 tablet (10 mg total) by mouth every 6 (six) hours as needed for nausea or vomiting. 30 tablet 3   valsartan  (DIOVAN ) 320 MG tablet Take 1 tablet (320 mg total) by mouth daily. 90 tablet 3   Vitamin D , Ergocalciferol , (DRISDOL ) 1.25 MG (50000 UNIT) CAPS capsule Take 1 capsule (50,000 Units total) by mouth every 7 (seven) days. 12 capsule 1   No current facility-administered medications for this visit.    OBJECTIVE: Vitals:   09/06/24 0903  BP:  (!) 129/59  Pulse: 84  Resp: 20  Temp: 98.2 F (36.8 C)  SpO2: 100%     Body mass index is 31.86 kg/m.    ECOG FS:0 - Asymptomatic  General: Well-developed, well-nourished, no acute distress. Eyes: Pink conjunctiva, anicteric sclera. HEENT: Normocephalic, moist mucous membranes. Lungs: No audible wheezing or coughing. Heart: Regular rate and rhythm. Abdomen: Soft, nontender, no obvious distention. Musculoskeletal: No edema, cyanosis, or clubbing. Neuro: Alert, answering all questions appropriately. Cranial nerves grossly intact. Skin: No rashes or petechiae noted. Psych: Normal affect.  LAB RESULTS:  Lab Results  Component Value Date   NA 139 09/06/2024   K 3.0 (L) 09/06/2024   CL 104 09/06/2024   CO2 23 09/06/2024   GLUCOSE 200 (H) 09/06/2024   BUN 11 09/06/2024  CREATININE 0.68 09/06/2024   CALCIUM  9.0 09/06/2024   PROT 6.6 09/06/2024   ALBUMIN 3.9 09/06/2024   AST 19 09/06/2024   ALT 25 09/06/2024   ALKPHOS 85 09/06/2024   BILITOT 0.3 09/06/2024   GFRNONAA >60 09/06/2024   GFRAA 82 01/05/2020    Lab Results  Component Value Date   WBC 4.4 09/06/2024   NEUTROABS 3.6 09/06/2024   HGB 8.9 (L) 09/06/2024   HCT 27.1 (L) 09/06/2024   MCV 92.2 09/06/2024   PLT 340 09/06/2024     STUDIES: No results found.   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  After lengthy discussion with the patient, she agreed to proceed with neoadjuvant chemotherapy with Adriamycin  and Cytoxan  x 4 with GCF support followed by weekly Taxol  x 12.  This will then be followed by lumpectomy and adjuvant XRT.  After completion of all her treatments, she will benefit from 5 years of letrozole given the ER positivity.  MUGA scan from June 24, 2024 revealed an EF  of 57%.  Patient has had port placement.  Patient has now completed Adriamycin  and Cytoxan .  Proceed with cycle 3 of 12 weekly Taxol  today.  Return to clinic in 1 week for further evaluation and consideration of cycle 4.   Chest pain/palpitations/hypertension: Resolved.  Continue follow-up with cardiology as scheduled. Hypokalemia: Patient will receive IV potassium along with her treatment today.  She was also given dietary recommendations.  If no improvement, will add potassium supplements next week.   Anemia: Chronic and unchanged.  Patient's hemoglobin is 8.9 today.  Monitor.  Previously, iron stores, folate, and B12 were all within normal limits. Thrombocytosis: Resolved.  The entire visit was done in the presence of an ASL interpreter.  Patient expressed understanding and was in agreement with this plan. She also understands that She can call clinic at any time with any questions, concerns, or complaints.    Cancer Staging  Invasive ductal carcinoma of right breast [facility]) Staging form: Breast, AJCC 8th Edition - Clinical stage from 06/16/2024: Stage IIB (cT2, cN0, cM0, G3, ER+, PR-, HER2-) - Signed by Jacobo Evalene PARAS, MD on 06/16/2024 Stage prefix: Initial diagnosis Histologic grading system: 3 grade system   Evalene PARAS Jacobo, MD   09/06/2024 9:38 AM      "

## 2024-09-06 NOTE — Patient Instructions (Signed)

## 2024-09-07 ENCOUNTER — Ambulatory Visit: Admitting: Internal Medicine

## 2024-09-07 ENCOUNTER — Encounter: Payer: Self-pay | Admitting: Internal Medicine

## 2024-09-07 VITALS — BP 120/80 | HR 87 | Temp 99.0°F | Ht 64.0 in | Wt 186.2 lb

## 2024-09-07 DIAGNOSIS — G4733 Obstructive sleep apnea (adult) (pediatric): Secondary | ICD-10-CM

## 2024-09-07 DIAGNOSIS — J45909 Unspecified asthma, uncomplicated: Secondary | ICD-10-CM | POA: Diagnosis not present

## 2024-09-07 DIAGNOSIS — E669 Obesity, unspecified: Secondary | ICD-10-CM

## 2024-09-07 DIAGNOSIS — Z6831 Body mass index (BMI) 31.0-31.9, adult: Secondary | ICD-10-CM | POA: Diagnosis not present

## 2024-09-07 DIAGNOSIS — R0602 Shortness of breath: Secondary | ICD-10-CM

## 2024-09-07 DIAGNOSIS — J4540 Moderate persistent asthma, uncomplicated: Secondary | ICD-10-CM

## 2024-09-07 NOTE — Patient Instructions (Signed)
 Recommend in-lab sleep study for further evaluation Continue inhalers as prescribed rinse mouth after use Avoid Allergens and Irritants Avoid secondhand smoke Avoid SICK contacts Recommend  Masking  when appropriate Recommend Keep up-to-date with vaccinations

## 2024-09-07 NOTE — Progress Notes (Signed)
 "  Name: Caitlin [name] MRN: [id number] DOB: March 20, 1966    SYNOPSIS  Patient is deaf and we used interpreter on a iPad with  sign language 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 and teeth pain  Moderate ASTHMA Patient is a non-smoker Nonalcoholic No secondhand smoke exposure Patient diagnosed with asthma 5 years ago Seems to be controlled at this time however she is dependent on Symbicort  inhaler Uses albuterol  infrequently  RT BREAST CANCER-ONCOLOGY ASSESSMENT 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 After lengthy discussion with the patient, she agreed to proceed with neoadjuvant chemotherapy with Adriamycin  and Cytoxan  x 4 with GCF support followed by weekly Taxol  x 12. This will then be followed by lumpectomy and adjuvant XRT. After completion of all her treatments, she will benefit from 5 years of letrozole given the ER positivity. MUGA scan from June 24, 2024 revealed an EF of 57%. Patient has had port placement.    CHIEF COMPLAINT:  Follow up assessment of OSA Follow up assessment of ASTHMA   HISTORY OF PRESENT ILLNESS: Patient is deaf and we used interpreter on a iPad with  sign language Asthma is under control using Symbicort  She started that 2 weeks ago No significant respiratory symptoms at this time  No exacerbation at this time No evidence of heart failure at this time No evidence or signs of infection at this time No respiratory distress No fevers, chills, nausea, vomiting, diarrhea No evidence of lower extremity edema No evidence hemoptysis  Recent visit with oncology plan is for chemo subsequent lumpectomy and radiation therapy     10/29/2023    9:59 AM  Results of the Epworth flowsheet  Sitting and reading 2   Watching TV 2  Sitting, inactive in a public place (e.g. a theatre or a meeting) 0  As a passenger in a car for an hour without a break 0  Lying down to rest in the afternoon when circumstances permit 2  Sitting and talking to someone 2  Sitting quietly after a lunch without alcohol 3  In a car, while stopped for a few minutes in traffic 2  Total score 13   Patient will be assessed with in-lab sleep study for better evaluation and assessment of hypoxia Previous sleep study in 2021 AHI of 34  PAST MEDICAL HISTORY :   has a past medical history of Asthma, Cancer (HCC), COPD (chronic obstructive pulmonary disease) (HCC), Deaf, Endometrial polyp, GERD (gastroesophageal reflux disease), Hypertension (2011), NO (nasal obstruction), and OSA (obstructive sleep apnea).  has a past surgical history that includes Cholecystectomy; Cesarean section; Tubal ligation; Dental surgery (Bilateral, 04/2024); Breast biopsy (Right, 06/08/2024); Portacath placement (Left, 06/22/2024); and Hysteroscopy with D & C (N/A, 07/18/2024). Prior to Admission medications   Medication Sig Start Date End Date Taking? Authorizing Provider  albuterol  (VENTOLIN  HFA) 108 (90 Base) MCG/ACT inhaler Inhale 2 puffs into the lungs every 6 (six) hours as needed for wheezing or shortness of breath. 04/22/23   Donzella Lauraine SAILOR, DO  amLODipine  (NORVASC ) 10 MG tablet TAKE 1 TABLET BY MOUTH ONCE DAILY TO LOWER BLOOD PRESSURE 09/28/23   Donzella Lauraine SAILOR, DO  atorvastatin  (LIPITOR) 10 MG tablet Take 1 tablet by mouth once daily 12/19/22   Vicci Barnie NOVAK, MD  Blood  Pressure Monitor KIT Use to check blood pressure daily 10/27/19   Fulp, Cammie, MD  budesonide -formoterol  (SYMBICORT ) 80-4.5 MCG/ACT inhaler Inhale 2 puffs into the lungs in the morning and at bedtime. 04/22/23   Donzella Lauraine SAILOR, DO  buPROPion  (WELLBUTRIN  XL) 150 MG 24 hr tablet Take 1 tablet (150 mg total) by mouth daily. Take 1 tablet daily for 3 days, then take 2 tablets daily Patient  not taking: Reported on 09/23/2023 08/27/23   Donzella Lauraine SAILOR, DO  carvedilol  (COREG ) 3.125 MG tablet Take 1 tablet (3.125 mg total) by mouth 2 (two) times daily with a meal. 04/22/23   Pardue, Lauraine SAILOR, DO  famotidine  (PEPCID ) 20 MG tablet Take 1 tablet (20 mg total) by mouth daily before breakfast. 04/22/23   Pardue, Lauraine SAILOR, DO  levocetirizine (XYZAL ) 5 MG tablet Take 1 tablet (5 mg total) by mouth every evening. Patient not taking: Reported on 09/23/2023 12/12/21   Jeneal Danita Macintosh, MD  Minoxidil  5 % FOAM Apply 1 Application topically 2 (two) times daily. Patient not taking: Reported on 09/23/2023 04/22/23   Donzella Lauraine SAILOR, DO  Misc. Devices MISC CPAP therapy on 17 cm H2O . Patient to use X-Small size Resmed Full Face Mask AirFit F10. Diagnosis - Obstructive sleep apnea Patient not taking: Reported on 09/23/2023 01/25/20   Newlin, Enobong, MD  naltrexone  (DEPADE) 50 MG tablet Take 0.5 tablets (25 mg total) by mouth daily. Patient not taking: Reported on 09/23/2023 08/27/23   Donzella Lauraine SAILOR, DO  naproxen  (NAPROSYN ) 500 MG tablet Take 1 tablet (500 mg total) by mouth 2 (two) times daily with a meal. Patient not taking: Reported on 09/23/2023 01/11/23 01/11/24  Lang Dover, MD  nystatin  (MYCOSTATIN /NYSTOP ) powder Apply 1 application topically 2 (two) times daily. Apply to affected area under breast Patient not taking: Reported on 09/23/2023 04/25/21   Vicci Barnie NOVAK, MD  Olopatadine  HCl 0.2 % SOLN Apply 1 drop to eye daily as needed (itchy/watery eyes). Patient not taking: Reported on 09/23/2023 12/12/21   Jeneal Danita Macintosh, MD  omeprazole  (PRILOSEC  OTC) 20 MG tablet Take 2 tablets (40 mg total) by mouth daily for 14 days. Patient not taking: Reported on 09/23/2023 01/28/23 02/11/23  Woods, Jaclyn M, PA-C  traZODone  (DESYREL ) 50 MG tablet Take 0.5-1 tablets (25-50 mg total) by mouth at bedtime as needed for sleep. Patient not taking: Reported on 09/23/2023 10/27/19   Alec House, MD   valsartan -hydrochlorothiazide  (DIOVAN -HCT) 320-25 MG tablet Take 1 tablet by mouth once daily 09/28/23   Pardue, Sarah N, DO  Vitamin D , Ergocalciferol , (DRISDOL ) 1.25 MG (50000 UNIT) CAPS capsule Take 1 capsule (50,000 Units total) by mouth every 7 (seven) days. 09/05/23   Donzella Lauraine SAILOR, DO   Allergies  Allergen Reactions   Egg Protein-Containing Drug Products Nausea And Vomiting   Milk-Related Compounds     FAMILY HISTORY:  family history includes Brain cancer in her half-brother; Breast cancer in her half-sister; Heart disease in her mother; Stroke in her mother. SOCIAL HISTORY:  reports that she has never smoked. She has never used smokeless tobacco. She reports that she does not currently use alcohol after a past usage of about 1.0 standard drink of alcohol per week. She reports that she does not currently use drugs after having used the following drugs: Crack cocaine.  Pulse 87   Ht 5' 4 (1.626 m)   Wt 186 lb 3.2 oz (84.5 kg)   LMP 05/08/2016   SpO2 98%   BMI 31.96 kg/m  Physical Examination:  General Appearance: No distress  EYES EOM intact.   NECK Supple, No JVD Pulmonary: normal breath sounds, No wheezing.  CardiovascularNormal S1,S2.  No m/r/g.   Ext pulses intact, cap refill intact  ALL OTHER ROS ARE NEGATIVE     ASSESSMENT AND PLAN SYNOPSIS 59 year old pleasant female seen today for assessment for sleep apnea and has actually diagnosis of severe sleep apnea with AHI of 34 in the setting of underlying reactive airways disease with asthma   Assessment for asthma Well-controlled Continue to take Symbicort  2 puffs twice daily Recommend rinsing mouth out after use Continue to use albuterol  as needed Avoid Allergens and Irritants Avoid secondhand smoke Avoid SICK contacts Recommend  Masking  when appropriate Recommend Keep up-to-date with vaccinations    Patient with signs and symptoms of excessive daytime sleepiness with  diagnosis of obstructive  sleep apnea in the setting of obesity and deconditioned state AHI 34 Recommend in-lab sleep study for further assessment and evaluation Patient is hard of hearing therefore in-lab would be ideal  Obesity -recommend significant weight loss -recommend changing diet  Deconditioned state -Recommend increased daily activity and exercise  MEDICATION ADJUSTMENTS/LABS AND TESTS ORDERED: In lab sleep study Continue Symbicort  Avoid Allergens and Irritants Avoid secondhand smoke Avoid SICK contacts Recommend  Masking  when appropriate Recommend Keep up-to-date with vaccinations    CURRENT MEDICATIONS REVIEWED AT LENGTH WITH PATIENT TODAY   Patient  satisfied with Plan of action and management. All questions answered   Follow up 3 months   I spent a total of 42 minutes dedicated to the care of this patient on the date of this encounter to include pre-visit review of records, face-to-face time with the patient discussing conditions above, post visit ordering of testing, clinical documentation with the electronic health record, making appropriate referrals as documented, and communicating necessary information to the patient's healthcare team.    The Patient requires high complexity decision making for assessment and support, frequent evaluation and titration of therapies, application of advanced monitoring technologies and extensive interpretation of multiple databases.  Patient satisfied with Plan of action and management. All questions answered    Nickolas Alm Cellar, M.D.  [facility]  Medical Director [facility]      "

## 2024-09-08 NOTE — Addendum Note (Signed)
 Addended byBETHA CELLAR, DAVID on: 09/08/2024 12:32 PM   Modules accepted: Level of Service

## 2024-09-13 ENCOUNTER — Inpatient Hospital Stay

## 2024-09-13 ENCOUNTER — Inpatient Hospital Stay: Admitting: Oncology

## 2024-09-13 ENCOUNTER — Encounter: Payer: Self-pay | Admitting: Oncology

## 2024-09-13 VITALS — BP 118/63 | HR 83 | Temp 99.0°F | Ht 64.0 in | Wt 184.0 lb

## 2024-09-13 VITALS — Resp 18

## 2024-09-13 DIAGNOSIS — C50911 Malignant neoplasm of unspecified site of right female breast: Secondary | ICD-10-CM

## 2024-09-13 DIAGNOSIS — D649 Anemia, unspecified: Secondary | ICD-10-CM

## 2024-09-13 DIAGNOSIS — Z5111 Encounter for antineoplastic chemotherapy: Secondary | ICD-10-CM | POA: Diagnosis not present

## 2024-09-13 LAB — CBC WITH DIFFERENTIAL (CANCER CENTER ONLY)
Abs Immature Granulocytes: 0.02 10*3/uL (ref 0.00–0.07)
Basophils Absolute: 0 10*3/uL (ref 0.0–0.1)
Basophils Relative: 1 %
Eosinophils Absolute: 0.1 10*3/uL (ref 0.0–0.5)
Eosinophils Relative: 3 %
HCT: 27.6 % — ABNORMAL LOW (ref 36.0–46.0)
Hemoglobin: 9 g/dL — ABNORMAL LOW (ref 12.0–15.0)
Immature Granulocytes: 1 %
Lymphocytes Relative: 11 %
Lymphs Abs: 0.5 10*3/uL — ABNORMAL LOW (ref 0.7–4.0)
MCH: 30.3 pg (ref 26.0–34.0)
MCHC: 32.6 g/dL (ref 30.0–36.0)
MCV: 92.9 fL (ref 80.0–100.0)
Monocytes Absolute: 0.4 10*3/uL (ref 0.1–1.0)
Monocytes Relative: 9 %
Neutro Abs: 3.1 10*3/uL (ref 1.7–7.7)
Neutrophils Relative %: 75 %
Platelet Count: 357 10*3/uL (ref 150–400)
RBC: 2.97 MIL/uL — ABNORMAL LOW (ref 3.87–5.11)
RDW: 14.6 % (ref 11.5–15.5)
WBC Count: 4.1 10*3/uL (ref 4.0–10.5)
nRBC: 0 % (ref 0.0–0.2)

## 2024-09-13 LAB — CMP (CANCER CENTER ONLY)
ALT: 22 U/L (ref 0–44)
AST: 19 U/L (ref 15–41)
Albumin: 3.8 g/dL (ref 3.5–5.0)
Alkaline Phosphatase: 84 U/L (ref 38–126)
Anion gap: 10 (ref 5–15)
BUN: 9 mg/dL (ref 6–20)
CO2: 24 mmol/L (ref 22–32)
Calcium: 8.9 mg/dL (ref 8.9–10.3)
Chloride: 104 mmol/L (ref 98–111)
Creatinine: 0.71 mg/dL (ref 0.44–1.00)
GFR, Estimated: >60 mL/min
Glucose, Bld: 152 mg/dL — ABNORMAL HIGH (ref 70–99)
Potassium: 3.6 mmol/L (ref 3.5–5.1)
Sodium: 138 mmol/L (ref 135–145)
Total Bilirubin: 0.4 mg/dL (ref 0.0–1.2)
Total Protein: 6.6 g/dL (ref 6.5–8.1)

## 2024-09-13 LAB — SAMPLE TO BLOOD BANK

## 2024-09-13 MED ORDER — ONDANSETRON HCL 4 MG/2ML IJ SOLN
8.0000 mg | Freq: Once | INTRAMUSCULAR | Status: AC
  Administered 2024-09-13: 8 mg via INTRAVENOUS
  Filled 2024-09-13: qty 4

## 2024-09-13 MED ORDER — FAMOTIDINE IN NACL 20-0.9 MG/50ML-% IV SOLN
20.0000 mg | Freq: Once | INTRAVENOUS | Status: AC
  Administered 2024-09-13: 20 mg via INTRAVENOUS
  Filled 2024-09-13: qty 50

## 2024-09-13 MED ORDER — SODIUM CHLORIDE 0.9 % IV SOLN
INTRAVENOUS | Status: DC
  Filled 2024-09-13: qty 250

## 2024-09-13 MED ORDER — DIPHENHYDRAMINE HCL 50 MG/ML IJ SOLN
25.0000 mg | Freq: Once | INTRAMUSCULAR | Status: AC
  Administered 2024-09-13: 25 mg via INTRAVENOUS
  Filled 2024-09-13: qty 1

## 2024-09-13 MED ORDER — SODIUM CHLORIDE 0.9 % IV SOLN
80.0000 mg/m2 | Freq: Once | INTRAVENOUS | Status: AC
  Administered 2024-09-13: 156 mg via INTRAVENOUS
  Filled 2024-09-13: qty 26

## 2024-09-13 MED ORDER — DEXAMETHASONE SOD PHOSPHATE PF 10 MG/ML IJ SOLN
10.0000 mg | Freq: Once | INTRAMUSCULAR | Status: AC
  Administered 2024-09-13: 10 mg via INTRAVENOUS
  Filled 2024-09-13: qty 1

## 2024-09-13 NOTE — Progress Notes (Signed)
 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  INTERVAL HISTORY: Patient returns to clinic today for further evaluation and consideration of cycle 4 of 12 weekly Taxol .  She has noticed increased dry itchy eyes, but believes this is related to her daughter's boyfriend's dog.  She otherwise feels well and is tolerating her treatments.  She has no neurologic complaints.  She denies any recent fevers or illnesses.  She has a good appetite and denies weight loss.  She has no chest pain, shortness of breath, cough, or hemoptysis.  She denies any nausea, vomiting, constipation, or diarrhea.  She has no urinary complaints.  Patient offers no further specific complaints today.  REVIEW OF SYSTEMS:   Review of Systems  Constitutional: Negative.  Negative for fever, malaise/fatigue and weight loss.  Respiratory: Negative.  Negative for cough, hemoptysis and shortness of breath.   Cardiovascular: Negative.  Negative for chest pain and leg swelling.  Gastrointestinal: Negative.  Negative for abdominal pain.  Genitourinary: Negative.  Negative for dysuria.  Musculoskeletal: Negative.  Negative for back pain.  Skin: Negative.  Negative for rash.  Neurological: Negative.  Negative for dizziness, focal weakness and weakness.  Psychiatric/Behavioral: Negative.  The patient is not nervous/anxious.     As per HPI. Otherwise, a complete review of systems is negative.  PAST MEDICAL HISTORY: Past Medical History:  Diagnosis Date   Asthma    Cancer (HCC)    COPD (chronic obstructive pulmonary disease) (HCC)    Deaf     Endometrial polyp    GERD (gastroesophageal reflux disease)    Hypertension 2011   NO (nasal obstruction)    OSA (obstructive sleep apnea)     PAST SURGICAL HISTORY: Past Surgical History:  Procedure Laterality Date   BREAST BIOPSY Right 06/08/2024   US  RT BREAST BX W LOC DEV 1ST LESION IMG BX SPEC US  GUIDE 06/08/2024 [facility]   CESAREAN SECTION     x 2   CHOLECYSTECTOMY     DENTAL SURGERY Bilateral 04/2024   Removed both fang teeth on the top and wisdom as well.   HYSTEROSCOPY WITH D & C N/A 07/18/2024   Procedure: DILATATION AND CURETTAGE /HYSTEROSCOPY;  Surgeon: Leigh Sober, MD;  Location: [facility];  Service: Gynecology;  Laterality: N/A;   PORTACATH PLACEMENT Left 06/22/2024   Procedure: INSERTION, TUNNELED CENTRAL VENOUS DEVICE, WITH PORT;  Surgeon: Marinda Jayson KIDD, MD;  Location: [facility];  Service: General;  Laterality: Left;   TUBAL LIGATION      FAMILY HISTORY: Family History  Problem Relation Age of Onset   Heart disease Mother    Stroke Mother    Brain cancer Half-Brother        dx 30s   Breast cancer Half-Sister        dx 30s    ADVANCED DIRECTIVES (Y/N):  N  HEALTH MAINTENANCE: Social History   Tobacco Use   Smoking status: Never   Smokeless tobacco: Never  Vaping Use   Vaping status: Never Used  Substance Use Topics   Alcohol use: Not Currently    Alcohol/week: 1.0 standard drink of alcohol    Types: 1 Glasses of  wine per week    Comment: occassionally    Drug use: Not Currently    Types: Crack cocaine     Colonoscopy:  PAP:  Bone density:  Lipid panel:  Allergies  Allergen Reactions   Egg Protein-Containing Drug Products Nausea And Vomiting   Milk-Related Compounds     Current Outpatient Medications  Medication Sig Dispense Refill   albuterol  (VENTOLIN  HFA) 108 (90 Base) MCG/ACT inhaler Inhale 2 puffs into the lungs every 6 (six) hours as needed for wheezing or shortness of breath. 18 g 6   atorvastatin  (LIPITOR) 20 MG tablet  Take 1 tablet (20 mg total) by mouth daily. 90 tablet 1   Blood Pressure Monitor KIT Use to check blood pressure daily 1 kit 0   budesonide -formoterol  (SYMBICORT ) 80-4.5 MCG/ACT inhaler Inhale 2 puffs into the lungs in the morning and at bedtime. 1 each 6   carvedilol  (COREG ) 3.125 MG tablet Take 1 tablet (3.125 mg total) by mouth 2 (two) times daily with a meal. 60 tablet 3   ibuprofen  (ADVIL ) 800 MG tablet Take 1 tablet (800 mg total) by mouth every 8 (eight) hours as needed for cramping. 15 tablet 0   levocetirizine (XYZAL ) 5 MG tablet Take 1 tablet (5 mg total) by mouth every evening. 90 tablet 3   lidocaine -prilocaine  (EMLA ) cream Apply to affected area once 30 g 3   Misc. Devices MISC CPAP therapy on 17 cm H2O . Patient to use X-Small size Resmed Full Face Mask AirFit F10. Diagnosis - Obstructive sleep apnea 1 each 0   neomycin-polymyxin b-dexamethasone  (MAXITROL) 3.5-10000-0.1 SUSP SMARTSIG:In Eye(s)     nitroGLYCERIN  (NITROSTAT ) 0.4 MG SL tablet Place 1 tablet (0.4 mg total) under the tongue every 5 (five) minutes as needed for chest pain. 20 tablet 1   omeprazole  (PRILOSEC ) 40 MG capsule Take 1 capsule (40 mg total) by mouth daily. 30 capsule 3   ondansetron  (ZOFRAN ) 8 MG tablet Take 1 tablet (8 mg total) by mouth every 8 (eight) hours as needed for nausea or vomiting. 30 tablet 3   oxyCODONE -acetaminophen  (PERCOCET/ROXICET) 5-325 MG tablet Take 1 tablet by mouth every 8 (eight) hours as needed for severe pain (pain score 7-10). 3 tablet 0   prochlorperazine  (COMPAZINE ) 10 MG tablet Take 1 tablet (10 mg total) by mouth every 6 (six) hours as needed for nausea or vomiting. 30 tablet 3   valsartan  (DIOVAN ) 320 MG tablet Take 1 tablet (320 mg total) by mouth daily. 90 tablet 3   Vitamin D , Ergocalciferol , (DRISDOL ) 1.25 MG (50000 UNIT) CAPS capsule Take 1 capsule (50,000 Units total) by mouth every 7 (seven) days. 12 capsule 1   No current facility-administered medications for this visit.    Facility-Administered Medications Ordered in Other Visits  Medication Dose Route Frequency Provider Last Rate Last Admin   0.9 %  sodium chloride  infusion   Intravenous Continuous Chinedu Agustin J, MD 10 mL/hr at 09/13/24 1145 New Bag at 09/13/24 1145   PACLitaxel  (TAXOL ) 156 mg in sodium chloride  0.9 % 250 mL chemo infusion (</= 80mg /m2)  80 mg/m2 (Treatment Plan Recorded) Intravenous Once Reighn Kaplan J, MD 276 mL/hr at 09/13/24 1246 156 mg at 09/13/24 1246    OBJECTIVE: Vitals:   09/13/24 1041  BP: 118/63  Pulse: 83  Temp: 99 F (37.2 C)  SpO2: 100%     Body mass index is 31.58 kg/m.    ECOG FS:0 - Asymptomatic  General: Well-developed, well-nourished, no acute distress. Eyes: Pink conjunctiva, anicteric  sclera. HEENT: Normocephalic, moist mucous membranes. Lungs: No audible wheezing or coughing. Heart: Regular rate and rhythm. Abdomen: Soft, nontender, no obvious distention. Musculoskeletal: No edema, cyanosis, or clubbing. Neuro: Alert, answering all questions appropriately. Cranial nerves grossly intact. Skin: No rashes or petechiae noted. Psych: Normal affect.  LAB RESULTS:  Lab Results  Component Value Date   NA 138 09/13/2024   K 3.6 09/13/2024   CL 104 09/13/2024   CO2 24 09/13/2024   GLUCOSE 152 (H) 09/13/2024   BUN 9 09/13/2024   CREATININE 0.71 09/13/2024   CALCIUM  8.9 09/13/2024   PROT 6.6 09/13/2024   ALBUMIN 3.8 09/13/2024   AST 19 09/13/2024   ALT 22 09/13/2024   ALKPHOS 84 09/13/2024   BILITOT 0.4 09/13/2024   GFRNONAA >60 09/13/2024   GFRAA 82 01/05/2020    Lab Results  Component Value Date   WBC 4.1 09/13/2024   NEUTROABS 3.1 09/13/2024   HGB 9.0 (L) 09/13/2024   HCT 27.6 (L) 09/13/2024   MCV 92.9 09/13/2024   PLT 357 09/13/2024     STUDIES: No results found.   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  PLAN:    Clinical stage IIb ER positive, PR/HER2  negative invasive carcinoma of the right breast: Malignancy is nearly triple negative given the only 30% ER positivity.  After lengthy discussion with the patient, she agreed to proceed with neoadjuvant chemotherapy with Adriamycin  and Cytoxan  x 4 with GCF support followed by weekly Taxol  x 12.  This will then be followed by lumpectomy and adjuvant XRT.  After completion of all her treatments, she will benefit from 5 years of letrozole given the ER positivity.  MUGA scan from June 24, 2024 revealed an EF of 57%.  Patient has had port placement.  Patient has now completed Adriamycin  and Cytoxan .  Proceed with cycle 4 of 12 weekly Taxol  today.  Return to clinic in 1 week for further evaluation and consideration of cycle 5.   Chest pain/palpitations/hypertension: Resolved.  Continue follow-up with cardiology as scheduled. Hypokalemia: Resolved.  Continue with dietary recommendations.   Hyperglycemia: Chronic and unchanged. Anemia: Chronic and unchanged.  Patient's hemoglobin is 9.0 today.  Monitor.  Previously, iron stores, folate, and B12 were all within normal limits. Dry itchy eyes: Patient was instructed to use artificial tears and consider OTC Claritin , Allegra, or Zyrtec.  The entire visit was done in the presence of an ASL interpreter.  Patient expressed understanding and was in agreement with this plan. She also understands that She can call clinic at any time with any questions, concerns, or complaints.    Cancer Staging  Invasive ductal carcinoma of right breast [facility]) Staging form: Breast, AJCC 8th Edition - Clinical stage from 06/16/2024: Stage IIB (cT2, cN0, cM0, G3, ER+, PR-, HER2-) - Signed by Jacobo Evalene PARAS, MD on 06/16/2024 Stage prefix: Initial diagnosis Histologic grading system: 3 grade system   Evalene PARAS Jacobo, MD   09/13/2024 1:12 PM      "

## 2024-09-13 NOTE — Progress Notes (Signed)
 Patient states complaints of sore throat and itchy eyes over the past 2 months and being around a dog, wanting to know if she can buy a OTC eye drop for her eyes.

## 2024-09-13 NOTE — Patient Instructions (Signed)
 [facility] - A DEPT OF [place]  Discharge Instructions: Thank you for choosing [facility] to provide your oncology and hematology care.  If you have a lab appointment with the Cancer Center, please go directly to the Cancer Center and check in at the registration area.  Wear comfortable clothing and clothing appropriate for easy access to any Portacath or PICC line.   We strive to give you quality time with your provider. You may need to reschedule your appointment if you arrive late (15 or more minutes).  Arriving late affects you and other patients whose appointments are after yours.  Also, if you miss three or more appointments without notifying the office, you may be dismissed from the clinic at the providers discretion.      For prescription refill requests, have your pharmacy contact our office and allow 72 hours for refills to be completed.    Today you received the following chemotherapy and/or immunotherapy agents Taxol       To help prevent nausea and vomiting after your treatment, we encourage you to take your nausea medication as directed.  BELOW ARE SYMPTOMS THAT SHOULD BE REPORTED IMMEDIATELY: *FEVER GREATER THAN 100.4 F (38 C) OR HIGHER *CHILLS OR SWEATING *NAUSEA AND VOMITING THAT IS NOT CONTROLLED WITH YOUR NAUSEA MEDICATION *UNUSUAL SHORTNESS OF BREATH *UNUSUAL BRUISING OR BLEEDING *URINARY PROBLEMS (pain or burning when urinating, or frequent urination) *BOWEL PROBLEMS (unusual diarrhea, constipation, pain near the anus) TENDERNESS IN MOUTH AND THROAT WITH OR WITHOUT PRESENCE OF ULCERS (sore throat, sores in mouth, or a toothache) UNUSUAL RASH, SWELLING OR PAIN  UNUSUAL VAGINAL DISCHARGE OR ITCHING   Items with * indicate a potential emergency and should be followed up as soon as possible or go to the Emergency Department if any problems should occur.  Please show the CHEMOTHERAPY ALERT CARD or IMMUNOTHERAPY ALERT  CARD at check-in to the Emergency Department and triage nurse.  Should you have questions after your visit or need to cancel or reschedule your appointment, please contact [facility] - A DEPT OF [name]  [phone] and follow the prompts.  Office hours are 8:00 a.m. to 4:30 p.m. Monday - Friday. Please note that voicemails left after 4:00 p.m. may not be returned until the following business day.  We are closed weekends and major holidays. You have access to a nurse at all times for urgent questions. Please call the main number to the clinic [phone] and follow the prompts.  For any non-urgent questions, you may also contact your provider using MyChart. We now offer e-Visits for anyone 102 and older to request care online for non-urgent symptoms. For details visit [email or web address].   Also download the MyChart app! Go to the app store, search MyChart, open the app, select [place], and log in with your MyChart username and password.   Paclitaxel  Injection What is this medication? PACLITAXEL  (PAK li TAX el) treats some types of cancer. It works by slowing down the growth of cancer cells. This medicine may be used for other purposes; ask your health care provider or pharmacist if you have questions. COMMON BRAND NAME(S): Onxol, Taxol  What should I tell my care team before I take this medication? They need to know if you have any of these conditions: Heart disease Liver disease Low white blood cell levels An unusual or allergic reaction to paclitaxel , other medications, foods, dyes, or preservatives If you  or your partner are pregnant or trying to get pregnant Breast-feeding How should I use this medication? This medication is injected into a vein. It is given by your care team in a hospital or clinic setting. Talk to your care team about the use of this medication in children. While it may be given to children for selected conditions,  precautions do apply. Overdosage: If you think you have taken too much of this medicine contact a poison control center or emergency room at once. NOTE: This medicine is only for you. Do not share this medicine with others. What if I miss a dose? Keep appointments for follow-up doses. It is important not to miss your dose. Call your care team if you are unable to keep an appointment. What may interact with this medication? Do not take this medication with any of the following: Live virus vaccines Other medications may affect the way this medication works. Talk with your care team about all of the medications you take. They may suggest changes to your treatment plan to lower the risk of side effects and to make sure your medications work as intended. This list may not describe all possible interactions. Give your health care provider a list of all the medicines, herbs, non-prescription drugs, or dietary supplements you use. Also tell them if you smoke, drink alcohol, or use illegal drugs. Some items may interact with your medicine. What should I watch for while using this medication? Your condition will be monitored carefully while you are receiving this medication. You may need blood work while taking this medication. This medication may make you feel generally unwell. This is not uncommon as chemotherapy can affect healthy cells as well as cancer cells. Report any side effects. Continue your course of treatment even though you feel ill unless your care team tells you to stop. This medication can cause serious allergic reactions. To reduce the risk, your care team may give you other medications to take before receiving this one. Be sure to follow the directions from your care team. This medication may increase your risk of getting an infection. Call your care team for advice if you get a fever, chills, sore throat, or other symptoms of a cold or flu. Do not treat yourself. Try to avoid being around  people who are sick. This medication may increase your risk to bruise or bleed. Call your care team if you notice any unusual bleeding. Be careful brushing or flossing your teeth or using a toothpick because you may get an infection or bleed more easily. If you have any dental work done, tell your dentist you are receiving this medication. Talk to your care team if you may be pregnant. Serious birth defects can occur if you take this medication during pregnancy. Talk to your care team before breastfeeding. Changes to your treatment plan may be needed. What side effects may I notice from receiving this medication? Side effects that you should report to your care team as soon as possible: Allergic reactions--skin rash, itching, hives, swelling of the face, lips, tongue, or throat Heart rhythm changes--fast or irregular heartbeat, dizziness, feeling faint or lightheaded, chest pain, trouble breathing Increase in blood pressure Infection--fever, chills, cough, sore throat, wounds that don't heal, pain or trouble when passing urine, general feeling of discomfort or being unwell Low blood pressure--dizziness, feeling faint or lightheaded, blurry vision Low red blood cell level--unusual weakness or fatigue, dizziness, headache, trouble breathing Painful swelling, warmth, or redness of the skin, blisters or sores  at the infusion site Pain, tingling, or numbness in the hands or feet Slow heartbeat--dizziness, feeling faint or lightheaded, confusion, trouble breathing, unusual weakness or fatigue Unusual bruising or bleeding Side effects that usually do not require medical attention (report to your care team if they continue or are bothersome): Diarrhea Hair loss Joint pain Loss of appetite Muscle pain Nausea Vomiting This list may not describe all possible side effects. Call your doctor for medical advice about side effects. You may report side effects to FDA at 1-800-FDA-1088. Where should I keep  my medication? This medication is given in a hospital or clinic. It will not be stored at home. NOTE: This sheet is a summary. It may not cover all possible information. If you have questions about this medicine, talk to your doctor, pharmacist, or health care provider.  2024 [employer] Standard (2021-12-24 00:00:00)

## 2024-09-20 ENCOUNTER — Inpatient Hospital Stay: Admitting: Oncology

## 2024-09-20 ENCOUNTER — Inpatient Hospital Stay

## 2024-09-20 ENCOUNTER — Encounter: Payer: Self-pay | Admitting: Oncology

## 2024-09-20 VITALS — BP 130/71 | HR 83 | Temp 98.1°F | Ht 64.0 in | Wt 184.0 lb

## 2024-09-20 VITALS — BP 132/74 | HR 94 | Resp 15

## 2024-09-20 DIAGNOSIS — C50911 Malignant neoplasm of unspecified site of right female breast: Secondary | ICD-10-CM

## 2024-09-20 DIAGNOSIS — Z95828 Presence of other vascular implants and grafts: Secondary | ICD-10-CM

## 2024-09-20 LAB — CBC WITH DIFFERENTIAL (CANCER CENTER ONLY)
Abs Immature Granulocytes: 0.02 10*3/uL (ref 0.00–0.07)
Basophils Absolute: 0 10*3/uL (ref 0.0–0.1)
Basophils Relative: 0 %
Eosinophils Absolute: 0.1 10*3/uL (ref 0.0–0.5)
Eosinophils Relative: 2 %
HCT: 28 % — ABNORMAL LOW (ref 36.0–46.0)
Hemoglobin: 9.2 g/dL — ABNORMAL LOW (ref 12.0–15.0)
Immature Granulocytes: 0 %
Lymphocytes Relative: 9 %
Lymphs Abs: 0.4 10*3/uL — ABNORMAL LOW (ref 0.7–4.0)
MCH: 30.3 pg (ref 26.0–34.0)
MCHC: 32.9 g/dL (ref 30.0–36.0)
MCV: 92.1 fL (ref 80.0–100.0)
Monocytes Absolute: 0.6 10*3/uL (ref 0.1–1.0)
Monocytes Relative: 12 %
Neutro Abs: 3.4 10*3/uL (ref 1.7–7.7)
Neutrophils Relative %: 77 %
Platelet Count: 406 10*3/uL — ABNORMAL HIGH (ref 150–400)
RBC: 3.04 MIL/uL — ABNORMAL LOW (ref 3.87–5.11)
RDW: 14.4 % (ref 11.5–15.5)
WBC Count: 4.5 10*3/uL (ref 4.0–10.5)
nRBC: 0 % (ref 0.0–0.2)

## 2024-09-20 LAB — CMP (CANCER CENTER ONLY)
ALT: 17 U/L (ref 0–44)
AST: 18 U/L (ref 15–41)
Albumin: 3.9 g/dL (ref 3.5–5.0)
Alkaline Phosphatase: 84 U/L (ref 38–126)
Anion gap: 10 (ref 5–15)
BUN: 10 mg/dL (ref 6–20)
CO2: 25 mmol/L (ref 22–32)
Calcium: 9.4 mg/dL (ref 8.9–10.3)
Chloride: 101 mmol/L (ref 98–111)
Creatinine: 0.67 mg/dL (ref 0.44–1.00)
GFR, Estimated: >60 mL/min
Glucose, Bld: 115 mg/dL — ABNORMAL HIGH (ref 70–99)
Potassium: 3.8 mmol/L (ref 3.5–5.1)
Sodium: 136 mmol/L (ref 135–145)
Total Bilirubin: 0.4 mg/dL (ref 0.0–1.2)
Total Protein: 6.9 g/dL (ref 6.5–8.1)

## 2024-09-20 MED ORDER — FAMOTIDINE IN NACL 20-0.9 MG/50ML-% IV SOLN
20.0000 mg | Freq: Once | INTRAVENOUS | Status: AC
  Administered 2024-09-20: 20 mg via INTRAVENOUS
  Filled 2024-09-20: qty 50

## 2024-09-20 MED ORDER — ALTEPLASE 2 MG IJ SOLR
2.0000 mg | Freq: Once | INTRAMUSCULAR | Status: AC
  Administered 2024-09-20: 2 mg
  Filled 2024-09-20: qty 2

## 2024-09-20 MED ORDER — SODIUM CHLORIDE 0.9 % IV SOLN
80.0000 mg/m2 | Freq: Once | INTRAVENOUS | Status: AC
  Administered 2024-09-20: 156 mg via INTRAVENOUS
  Filled 2024-09-20: qty 26

## 2024-09-20 MED ORDER — DIPHENHYDRAMINE HCL 50 MG/ML IJ SOLN
25.0000 mg | Freq: Once | INTRAMUSCULAR | Status: AC
  Administered 2024-09-20: 25 mg via INTRAVENOUS
  Filled 2024-09-20: qty 1

## 2024-09-20 MED ORDER — SODIUM CHLORIDE 0.9 % IV SOLN
INTRAVENOUS | Status: DC
  Filled 2024-09-20: qty 250

## 2024-09-20 MED ORDER — DEXAMETHASONE SOD PHOSPHATE PF 10 MG/ML IJ SOLN
10.0000 mg | Freq: Once | INTRAMUSCULAR | Status: AC
  Administered 2024-09-20: 10 mg via INTRAVENOUS
  Filled 2024-09-20: qty 1

## 2024-09-20 NOTE — Progress Notes (Signed)
 Patient states appetite has decreased since last visit, mainly the taste has changed. Swallowing has gotten more difficult with solid foods, feels like she needs to continue to drink water to flush the food down and ultimately uses tums for relief. Currently is taking omeprazole .

## 2024-09-27 ENCOUNTER — Inpatient Hospital Stay

## 2024-09-27 ENCOUNTER — Inpatient Hospital Stay: Admitting: Oncology

## 2024-10-04 ENCOUNTER — Inpatient Hospital Stay: Admitting: Oncology

## 2024-10-04 ENCOUNTER — Inpatient Hospital Stay

## 2024-10-11 ENCOUNTER — Inpatient Hospital Stay: Admitting: Oncology

## 2024-10-11 ENCOUNTER — Inpatient Hospital Stay

## 2024-10-11 ENCOUNTER — Ambulatory Visit: Admitting: Cardiology

## 2024-10-18 ENCOUNTER — Inpatient Hospital Stay

## 2024-10-18 ENCOUNTER — Inpatient Hospital Stay: Admitting: Oncology

## 2024-10-25 ENCOUNTER — Inpatient Hospital Stay

## 2024-10-25 ENCOUNTER — Inpatient Hospital Stay: Admitting: Oncology

## 2024-12-07 ENCOUNTER — Ambulatory Visit: Admitting: Internal Medicine

## 2024-12-14 ENCOUNTER — Encounter: Admitting: Family Medicine
